# Patient Record
Sex: Female | Born: 1940
Health system: Southern US, Community
[De-identification: ages and names within clinical notes are randomized; demographics above are authoritative.]

## PROBLEM LIST (undated history)

## (undated) DIAGNOSIS — M545 Low back pain, unspecified: Secondary | ICD-10-CM

## (undated) DIAGNOSIS — F430 Acute stress reaction: Secondary | ICD-10-CM

## (undated) DIAGNOSIS — I8390 Asymptomatic varicose veins of unspecified lower extremity: Secondary | ICD-10-CM

## (undated) DIAGNOSIS — E7800 Pure hypercholesterolemia, unspecified: Secondary | ICD-10-CM

## (undated) DIAGNOSIS — H269 Unspecified cataract: Secondary | ICD-10-CM

## (undated) DIAGNOSIS — K5730 Diverticulosis of large intestine without perforation or abscess without bleeding: Secondary | ICD-10-CM

## (undated) DIAGNOSIS — R51 Headache: Secondary | ICD-10-CM

## (undated) DIAGNOSIS — J449 Chronic obstructive pulmonary disease, unspecified: Secondary | ICD-10-CM

## (undated) DIAGNOSIS — F17210 Nicotine dependence, cigarettes, uncomplicated: Secondary | ICD-10-CM

## (undated) HISTORY — DX: Pure hypercholesterolemia, unspecified: E78.00

## (undated) HISTORY — PX: OTHER SURGICAL HISTORY: SHX169

## (undated) HISTORY — DX: Headache: R51

## (undated) HISTORY — DX: Acute stress reaction: F43.0

## (undated) HISTORY — DX: Unspecified cataract: H26.9

## (undated) HISTORY — DX: Nicotine dependence, cigarettes, uncomplicated: F17.210

## (undated) HISTORY — DX: Asymptomatic varicose veins of unspecified lower extremity: I83.90

## (undated) HISTORY — DX: Low back pain, unspecified: M54.50

## (undated) HISTORY — DX: Chronic obstructive pulmonary disease, unspecified: J44.9

## (undated) HISTORY — PX: LUMBAR SPINE SURGERY: SHX701

## (undated) HISTORY — DX: Low back pain: M54.5

## (undated) HISTORY — DX: Diverticulosis of large intestine without perforation or abscess without bleeding: K57.30

---

## 1999-01-01 ENCOUNTER — Emergency Department (HOSPITAL_COMMUNITY): Admission: EM | Admit: 1999-01-01 | Discharge: 1999-01-02 | Payer: Self-pay

## 1999-08-19 ENCOUNTER — Encounter: Payer: Self-pay | Admitting: Gynecology

## 1999-08-19 ENCOUNTER — Other Ambulatory Visit: Admission: RE | Admit: 1999-08-19 | Discharge: 1999-08-19 | Payer: Self-pay | Admitting: Gynecology

## 1999-08-19 ENCOUNTER — Encounter: Admission: RE | Admit: 1999-08-19 | Discharge: 1999-08-19 | Payer: Self-pay | Admitting: Gynecology

## 2000-08-23 ENCOUNTER — Other Ambulatory Visit: Admission: RE | Admit: 2000-08-23 | Discharge: 2000-08-23 | Payer: Self-pay | Admitting: Gynecology

## 2000-08-24 ENCOUNTER — Encounter: Admission: RE | Admit: 2000-08-24 | Discharge: 2000-08-24 | Payer: Self-pay | Admitting: Gynecology

## 2000-08-24 ENCOUNTER — Encounter: Payer: Self-pay | Admitting: Gynecology

## 2000-12-13 ENCOUNTER — Ambulatory Visit (HOSPITAL_COMMUNITY): Admission: RE | Admit: 2000-12-13 | Discharge: 2000-12-13 | Payer: Self-pay | Admitting: Pulmonary Disease

## 2000-12-13 ENCOUNTER — Encounter: Payer: Self-pay | Admitting: Pulmonary Disease

## 2001-01-16 ENCOUNTER — Encounter: Admission: RE | Admit: 2001-01-16 | Discharge: 2001-01-25 | Payer: Self-pay | Admitting: Neurology

## 2001-09-03 ENCOUNTER — Encounter: Admission: RE | Admit: 2001-09-03 | Discharge: 2001-09-03 | Payer: Self-pay | Admitting: Pulmonary Disease

## 2001-09-03 ENCOUNTER — Encounter: Payer: Self-pay | Admitting: Pulmonary Disease

## 2001-09-18 ENCOUNTER — Other Ambulatory Visit: Admission: RE | Admit: 2001-09-18 | Discharge: 2001-09-18 | Payer: Self-pay | Admitting: Gynecology

## 2001-09-25 ENCOUNTER — Encounter: Payer: Self-pay | Admitting: Gynecology

## 2001-09-25 ENCOUNTER — Encounter: Admission: RE | Admit: 2001-09-25 | Discharge: 2001-09-25 | Payer: Self-pay | Admitting: Gynecology

## 2002-09-05 ENCOUNTER — Encounter: Admission: RE | Admit: 2002-09-05 | Discharge: 2002-09-05 | Payer: Self-pay | Admitting: Pulmonary Disease

## 2002-09-05 ENCOUNTER — Encounter: Payer: Self-pay | Admitting: Pulmonary Disease

## 2002-09-22 ENCOUNTER — Other Ambulatory Visit: Admission: RE | Admit: 2002-09-22 | Discharge: 2002-09-22 | Payer: Self-pay | Admitting: Gynecology

## 2003-09-09 ENCOUNTER — Ambulatory Visit (HOSPITAL_COMMUNITY): Admission: RE | Admit: 2003-09-09 | Discharge: 2003-09-09 | Payer: Self-pay | Admitting: Pulmonary Disease

## 2003-09-24 ENCOUNTER — Other Ambulatory Visit: Admission: RE | Admit: 2003-09-24 | Discharge: 2003-09-24 | Payer: Self-pay | Admitting: Gynecology

## 2004-04-22 ENCOUNTER — Ambulatory Visit: Payer: Self-pay | Admitting: Internal Medicine

## 2004-04-22 HISTORY — PX: COLONOSCOPY: SHX174

## 2004-04-28 ENCOUNTER — Ambulatory Visit: Payer: Self-pay

## 2004-04-28 ENCOUNTER — Ambulatory Visit: Payer: Self-pay | Admitting: Cardiology

## 2004-06-06 ENCOUNTER — Ambulatory Visit: Payer: Self-pay | Admitting: Cardiovascular Disease

## 2004-07-04 ENCOUNTER — Ambulatory Visit: Payer: Self-pay | Admitting: *Deleted

## 2004-07-04 ENCOUNTER — Ambulatory Visit: Payer: Self-pay | Admitting: Pulmonary Disease

## 2004-08-01 ENCOUNTER — Ambulatory Visit: Payer: Self-pay | Admitting: Cardiology

## 2004-09-14 ENCOUNTER — Ambulatory Visit (HOSPITAL_COMMUNITY): Admission: RE | Admit: 2004-09-14 | Discharge: 2004-09-14 | Payer: Self-pay | Admitting: Pulmonary Disease

## 2004-09-26 ENCOUNTER — Other Ambulatory Visit: Admission: RE | Admit: 2004-09-26 | Discharge: 2004-09-26 | Payer: Self-pay | Admitting: Gynecology

## 2004-10-31 ENCOUNTER — Ambulatory Visit: Payer: Self-pay | Admitting: Cardiology

## 2004-11-07 ENCOUNTER — Ambulatory Visit: Payer: Self-pay | Admitting: Cardiology

## 2005-02-06 ENCOUNTER — Ambulatory Visit: Payer: Self-pay | Admitting: Cardiology

## 2005-04-26 ENCOUNTER — Ambulatory Visit: Payer: Self-pay | Admitting: Pulmonary Disease

## 2005-05-26 ENCOUNTER — Ambulatory Visit: Payer: Self-pay | Admitting: Cardiology

## 2005-05-30 ENCOUNTER — Ambulatory Visit: Payer: Self-pay | Admitting: Pulmonary Disease

## 2005-09-07 ENCOUNTER — Ambulatory Visit: Payer: Self-pay | Admitting: Cardiology

## 2005-09-18 ENCOUNTER — Ambulatory Visit (HOSPITAL_COMMUNITY): Admission: RE | Admit: 2005-09-18 | Discharge: 2005-09-18 | Payer: Self-pay | Admitting: Gynecology

## 2005-09-28 ENCOUNTER — Ambulatory Visit: Payer: Self-pay

## 2005-10-05 ENCOUNTER — Ambulatory Visit: Payer: Self-pay | Admitting: Internal Medicine

## 2005-12-22 ENCOUNTER — Ambulatory Visit: Payer: Self-pay | Admitting: Internal Medicine

## 2005-12-28 ENCOUNTER — Ambulatory Visit: Payer: Self-pay | Admitting: Cardiology

## 2006-01-25 ENCOUNTER — Ambulatory Visit: Payer: Self-pay | Admitting: Cardiology

## 2006-04-04 ENCOUNTER — Ambulatory Visit: Payer: Self-pay | Admitting: Pulmonary Disease

## 2006-04-13 ENCOUNTER — Ambulatory Visit: Payer: Self-pay

## 2006-04-19 ENCOUNTER — Ambulatory Visit: Payer: Self-pay | Admitting: *Deleted

## 2006-07-20 ENCOUNTER — Ambulatory Visit: Payer: Self-pay | Admitting: Pulmonary Disease

## 2006-09-25 ENCOUNTER — Ambulatory Visit: Payer: Self-pay | Admitting: Pulmonary Disease

## 2006-09-25 ENCOUNTER — Ambulatory Visit: Payer: Self-pay

## 2006-09-25 LAB — CONVERTED CEMR LAB
ALT: 28 units/L (ref 0–40)
Albumin: 3.8 g/dL (ref 3.5–5.2)
Alkaline Phosphatase: 45 units/L (ref 39–117)
LDL Cholesterol: 109 mg/dL — ABNORMAL HIGH (ref 0–99)
Total CHOL/HDL Ratio: 3.1
Total Protein: 6.7 g/dL (ref 6.0–8.3)
VLDL: 27 mg/dL (ref 0–40)

## 2006-10-03 ENCOUNTER — Ambulatory Visit (HOSPITAL_COMMUNITY): Admission: RE | Admit: 2006-10-03 | Discharge: 2006-10-03 | Payer: Self-pay | Admitting: Family Medicine

## 2006-10-03 ENCOUNTER — Other Ambulatory Visit: Admission: RE | Admit: 2006-10-03 | Discharge: 2006-10-03 | Payer: Self-pay | Admitting: Gynecology

## 2006-10-04 ENCOUNTER — Ambulatory Visit: Payer: Self-pay | Admitting: Internal Medicine

## 2007-01-21 ENCOUNTER — Ambulatory Visit: Payer: Self-pay | Admitting: Cardiology

## 2007-01-21 LAB — CONVERTED CEMR LAB
AST: 27 units/L (ref 0–37)
Albumin: 4 g/dL (ref 3.5–5.2)
Bilirubin, Direct: 0.1 mg/dL (ref 0.0–0.3)
Cholesterol: 248 mg/dL (ref 0–200)
Direct LDL: 152.2 mg/dL
HDL: 57.8 mg/dL (ref >39.0)
Total Bilirubin: 0.7 mg/dL (ref 0.3–1.2)
Triglycerides: 163 mg/dL — ABNORMAL HIGH (ref 0–149)
VLDL: 33 mg/dL (ref 0–40)

## 2007-01-24 ENCOUNTER — Ambulatory Visit: Payer: Self-pay | Admitting: Cardiovascular Disease

## 2007-02-26 ENCOUNTER — Ambulatory Visit: Payer: Self-pay | Admitting: Cardiology

## 2007-02-26 LAB — CONVERTED CEMR LAB
Bilirubin, Direct: 0.1 mg/dL (ref 0.0–0.3)
HDL: 57.1 mg/dL (ref >39.0)
Total Protein: 6.8 g/dL (ref 6.0–8.3)
Triglycerides: 96 mg/dL (ref 0–149)
VLDL: 19 mg/dL (ref 0–40)

## 2007-03-07 ENCOUNTER — Ambulatory Visit: Payer: Self-pay | Admitting: Cardiology

## 2007-04-29 ENCOUNTER — Ambulatory Visit: Payer: Self-pay | Admitting: Internal Medicine

## 2007-04-29 LAB — CONVERTED CEMR LAB
ALT: 30 units/L (ref 0–35)
Alkaline Phosphatase: 52 units/L (ref 39–117)
Bilirubin, Direct: 0.1 mg/dL (ref 0.0–0.3)
Cholesterol: 254 mg/dL (ref 0–200)
Total CHOL/HDL Ratio: 4.5
Total Protein: 6.9 g/dL (ref 6.0–8.3)
Triglycerides: 157 mg/dL — ABNORMAL HIGH (ref 0–149)
VLDL: 31 mg/dL (ref 0–40)

## 2007-05-02 ENCOUNTER — Ambulatory Visit: Payer: Self-pay | Admitting: Cardiology

## 2007-07-09 ENCOUNTER — Ambulatory Visit: Payer: Self-pay | Admitting: Internal Medicine

## 2007-07-09 LAB — CONVERTED CEMR LAB
ALT: 27 units/L (ref 0–35)
Alkaline Phosphatase: 56 units/L (ref 39–117)
Total Bilirubin: 0.8 mg/dL (ref 0.3–1.2)
Total Protein: 6.8 g/dL (ref 6.0–8.3)
Triglycerides: 203 mg/dL (ref 0–149)

## 2007-07-11 ENCOUNTER — Ambulatory Visit: Payer: Self-pay | Admitting: Cardiology

## 2007-09-02 ENCOUNTER — Ambulatory Visit: Payer: Self-pay | Admitting: Internal Medicine

## 2007-09-02 LAB — CONVERTED CEMR LAB
Cholesterol: 250 mg/dL (ref 0–200)
Direct LDL: 146.3 mg/dL
HDL: 57.5 mg/dL (ref >39.0)
Total CHOL/HDL Ratio: 4.3
Total CK: 90 units/L (ref 7–177)
Triglycerides: 215 mg/dL (ref 0–149)

## 2007-09-05 ENCOUNTER — Ambulatory Visit: Payer: Self-pay | Admitting: Cardiology

## 2007-10-01 ENCOUNTER — Telehealth (INDEPENDENT_AMBULATORY_CARE_PROVIDER_SITE_OTHER): Payer: Self-pay | Admitting: *Deleted

## 2007-10-02 ENCOUNTER — Ambulatory Visit: Payer: Self-pay | Admitting: Pulmonary Disease

## 2007-10-02 LAB — CONVERTED CEMR LAB
AST: 28 units/L (ref 0–37)
Alkaline Phosphatase: 54 units/L (ref 39–117)
Basophils Absolute: 0.1 10*3/uL (ref 0.0–0.1)
Bilirubin Urine: NEGATIVE
Bilirubin, Direct: 0.1 mg/dL (ref 0.0–0.3)
Chloride: 107 meq/L (ref 96–112)
Cholesterol: 270 mg/dL (ref 0–200)
Direct LDL: 170.1 mg/dL
Eosinophils Absolute: 0.2 10*3/uL (ref 0.0–0.7)
GFR calc non Af Amer: 76 mL/min
HCT: 38.6 % (ref 36.0–46.0)
HDL: 58.7 mg/dL (ref >39.0)
Hemoglobin, Urine: NEGATIVE
Leukocytes, UA: NEGATIVE
Lymphocytes Relative: 32.6 % (ref 12.0–46.0)
Monocytes Relative: 7.8 % (ref 3.0–12.0)
Nitrite: NEGATIVE
Platelets: 258 10*3/uL (ref 150–400)
Potassium: 3.8 meq/L (ref 3.5–5.1)
RDW: 13.3 % (ref 11.5–14.6)
Sodium: 139 meq/L (ref 135–145)
Total Bilirubin: 0.8 mg/dL (ref 0.3–1.2)
Total CHOL/HDL Ratio: 4.6
Triglycerides: 166 mg/dL — ABNORMAL HIGH (ref 0–149)
Urobilinogen, UA: 0.2 (ref 0.0–1.0)
VLDL: 33 mg/dL (ref 0–40)

## 2007-10-08 DIAGNOSIS — M545 Low back pain, unspecified: Secondary | ICD-10-CM | POA: Insufficient documentation

## 2007-10-08 DIAGNOSIS — J449 Chronic obstructive pulmonary disease, unspecified: Secondary | ICD-10-CM | POA: Insufficient documentation

## 2007-10-08 DIAGNOSIS — E7800 Pure hypercholesterolemia, unspecified: Secondary | ICD-10-CM | POA: Insufficient documentation

## 2007-10-09 ENCOUNTER — Ambulatory Visit: Payer: Self-pay | Admitting: Pulmonary Disease

## 2007-10-09 DIAGNOSIS — F17200 Nicotine dependence, unspecified, uncomplicated: Secondary | ICD-10-CM | POA: Insufficient documentation

## 2007-10-12 DIAGNOSIS — I8390 Asymptomatic varicose veins of unspecified lower extremity: Secondary | ICD-10-CM | POA: Insufficient documentation

## 2007-10-12 DIAGNOSIS — E039 Hypothyroidism, unspecified: Secondary | ICD-10-CM | POA: Insufficient documentation

## 2007-10-12 DIAGNOSIS — K5730 Diverticulosis of large intestine without perforation or abscess without bleeding: Secondary | ICD-10-CM | POA: Insufficient documentation

## 2007-10-16 ENCOUNTER — Ambulatory Visit (HOSPITAL_COMMUNITY): Admission: RE | Admit: 2007-10-16 | Discharge: 2007-10-16 | Payer: Self-pay | Admitting: Pulmonary Disease

## 2007-10-21 ENCOUNTER — Telehealth (INDEPENDENT_AMBULATORY_CARE_PROVIDER_SITE_OTHER): Payer: Self-pay | Admitting: *Deleted

## 2007-11-21 ENCOUNTER — Telehealth (INDEPENDENT_AMBULATORY_CARE_PROVIDER_SITE_OTHER): Payer: Self-pay | Admitting: *Deleted

## 2007-12-23 ENCOUNTER — Ambulatory Visit: Payer: Self-pay

## 2007-12-23 LAB — CONVERTED CEMR LAB
Cholesterol: 219 mg/dL (ref 0–200)
Direct LDL: 139.4 mg/dL
Triglycerides: 154 mg/dL — ABNORMAL HIGH (ref 0–149)

## 2007-12-26 ENCOUNTER — Ambulatory Visit: Payer: Self-pay | Admitting: Cardiology

## 2008-04-27 ENCOUNTER — Ambulatory Visit: Payer: Self-pay | Admitting: Cardiovascular Disease

## 2008-04-27 LAB — CONVERTED CEMR LAB
AST: 30 units/L (ref 0–37)
Calcium: 9.5 mg/dL (ref 8.4–10.5)
Chloride: 106 meq/L (ref 96–112)
Cholesterol: 226 mg/dL (ref 0–200)
Creatinine, Ser: 1 mg/dL (ref 0.4–1.2)
Direct LDL: 137.2 mg/dL
GFR calc Af Amer: 71 mL/min
GFR calc non Af Amer: 58 mL/min
HDL: 53.7 mg/dL (ref >39.0)
Potassium: 4 meq/L (ref 3.5–5.1)
Total Bilirubin: 0.7 mg/dL (ref 0.3–1.2)
VLDL: 27 mg/dL (ref 0–40)

## 2008-05-04 ENCOUNTER — Ambulatory Visit: Payer: Self-pay | Admitting: Cardiology

## 2008-07-27 ENCOUNTER — Ambulatory Visit: Payer: Self-pay | Admitting: Cardiology

## 2008-07-27 LAB — CONVERTED CEMR LAB
Albumin: 4 g/dL (ref 3.5–5.2)
Alkaline Phosphatase: 46 units/L (ref 39–117)
Bilirubin, Direct: 0.1 mg/dL (ref 0.0–0.3)
Cholesterol: 220 mg/dL (ref 0–200)
Total CHOL/HDL Ratio: 3.9
VLDL: 22 mg/dL (ref 0–40)

## 2008-08-03 ENCOUNTER — Ambulatory Visit: Payer: Self-pay | Admitting: Internal Medicine

## 2008-09-28 ENCOUNTER — Ambulatory Visit: Payer: Self-pay

## 2008-09-28 LAB — CONVERTED CEMR LAB
ALT: 22 units/L (ref 0–35)
Albumin: 3.7 g/dL (ref 3.5–5.2)
Cholesterol: 222 mg/dL — ABNORMAL HIGH (ref 0–200)
Direct LDL: 138.2 mg/dL
HDL: 54.1 mg/dL (ref >39.00)
Total Bilirubin: 0.6 mg/dL (ref 0.3–1.2)
Triglycerides: 94 mg/dL (ref 0.0–149.0)

## 2008-10-05 ENCOUNTER — Encounter: Payer: Self-pay | Admitting: Cardiology

## 2008-10-05 ENCOUNTER — Ambulatory Visit: Payer: Self-pay | Admitting: Cardiovascular Disease

## 2008-10-07 ENCOUNTER — Ambulatory Visit: Payer: Self-pay | Admitting: Pulmonary Disease

## 2008-10-11 LAB — CONVERTED CEMR LAB
Basophils Relative: 1 % (ref 0.0–3.0)
CO2: 30 meq/L (ref 19–32)
Chloride: 104 meq/L (ref 96–112)
Creatinine, Ser: 0.9 mg/dL (ref 0.4–1.2)
Eosinophils Relative: 1.8 % (ref 0.0–5.0)
HCT: 41.4 % (ref 36.0–46.0)
Lymphocytes Relative: 33.1 % (ref 12.0–46.0)
Monocytes Relative: 9.5 % (ref 3.0–12.0)
Neutrophils Relative %: 54.6 % (ref 43.0–77.0)
Platelets: 292 10*3/uL (ref 150.0–400.0)
Potassium: 3.4 meq/L — ABNORMAL LOW (ref 3.5–5.1)
RBC: 4.6 M/uL (ref 3.87–5.11)
TSH: 3.44 microintl units/mL (ref 0.35–5.50)
Vit D, 25-Hydroxy: 30 ng/mL (ref 30–89)
WBC: 6.3 10*3/uL (ref 4.5–10.5)

## 2008-10-19 ENCOUNTER — Ambulatory Visit (HOSPITAL_COMMUNITY): Admission: RE | Admit: 2008-10-19 | Discharge: 2008-10-19 | Payer: Self-pay | Admitting: Pulmonary Disease

## 2008-10-19 ENCOUNTER — Encounter: Payer: Self-pay | Admitting: Pulmonary Disease

## 2009-01-04 ENCOUNTER — Ambulatory Visit: Payer: Self-pay | Admitting: Cardiovascular Disease

## 2009-01-06 LAB — CONVERTED CEMR LAB
ALT: 28 units/L (ref 0–35)
AST: 25 units/L (ref 0–37)
Albumin: 3.6 g/dL (ref 3.5–5.2)
Alkaline Phosphatase: 60 units/L (ref 39–117)
Bilirubin, Direct: 0 mg/dL (ref 0.0–0.3)
Cholesterol: 219 mg/dL — ABNORMAL HIGH (ref 0–200)
Direct LDL: 147.8 mg/dL
HDL: 55.5 mg/dL (ref >39.00)
Total Bilirubin: 0.6 mg/dL (ref 0.3–1.2)
Total CHOL/HDL Ratio: 4
Total Protein: 6.7 g/dL (ref 6.0–8.3)
Triglycerides: 96 mg/dL (ref 0.0–149.0)
VLDL: 19.2 mg/dL (ref 0.0–40.0)

## 2009-01-11 ENCOUNTER — Ambulatory Visit: Payer: Self-pay | Admitting: Cardiology

## 2009-04-19 ENCOUNTER — Ambulatory Visit: Payer: Self-pay | Admitting: Internal Medicine

## 2009-04-19 DIAGNOSIS — E782 Mixed hyperlipidemia: Secondary | ICD-10-CM | POA: Insufficient documentation

## 2009-04-19 LAB — CONVERTED CEMR LAB
ALT: 30 units/L
LDL Cholesterol: 105 mg/dL

## 2009-04-22 ENCOUNTER — Ambulatory Visit: Payer: Self-pay | Admitting: Internal Medicine

## 2009-04-22 LAB — CONVERTED CEMR LAB
Cholesterol, target level: 200 mg/dL
LDL Goal: 130 mg/dL

## 2009-04-29 LAB — CONVERTED CEMR LAB
ALT: 30 units/L (ref 0–35)
AST: 25 units/L (ref 0–37)
Cholesterol: 228 mg/dL — ABNORMAL HIGH (ref 0–200)
Direct LDL: 151.9 mg/dL
HDL: 54.4 mg/dL (ref >39.00)
Total CHOL/HDL Ratio: 4
Triglycerides: 105 mg/dL (ref 0.0–149.0)

## 2009-05-21 ENCOUNTER — Ambulatory Visit: Payer: Self-pay | Admitting: Pulmonary Disease

## 2009-08-16 ENCOUNTER — Ambulatory Visit: Payer: Self-pay | Admitting: Pulmonary Disease

## 2009-08-16 DIAGNOSIS — R519 Headache, unspecified: Secondary | ICD-10-CM | POA: Insufficient documentation

## 2009-08-16 DIAGNOSIS — H9190 Unspecified hearing loss, unspecified ear: Secondary | ICD-10-CM | POA: Insufficient documentation

## 2009-08-16 DIAGNOSIS — R51 Headache: Secondary | ICD-10-CM | POA: Insufficient documentation

## 2009-08-19 LAB — CONVERTED CEMR LAB
ALT: 23 units/L (ref 0–35)
AST: 21 units/L (ref 0–37)
Alkaline Phosphatase: 53 units/L (ref 39–117)
Basophils Relative: 1.3 % (ref 0.0–3.0)
Bilirubin, Direct: 0.1 mg/dL (ref 0.0–0.3)
CO2: 28 meq/L (ref 19–32)
Calcium: 9.6 mg/dL (ref 8.4–10.5)
Chloride: 106 meq/L (ref 96–112)
Eosinophils Absolute: 0.4 10*3/uL (ref 0.0–0.7)
Eosinophils Relative: 6.7 % — ABNORMAL HIGH (ref 0.0–5.0)
Hemoglobin: 13.5 g/dL (ref 12.0–15.0)
MCHC: 32.8 g/dL (ref 30.0–36.0)
MCV: 91.2 fL (ref 78.0–100.0)
Monocytes Absolute: 0.6 10*3/uL (ref 0.1–1.0)
Neutro Abs: 3.6 10*3/uL (ref 1.4–7.7)
Potassium: 3.8 meq/L (ref 3.5–5.1)
Sodium: 139 meq/L (ref 135–145)
Total Protein: 7 g/dL (ref 6.0–8.3)

## 2009-09-29 ENCOUNTER — Encounter: Payer: Self-pay | Admitting: Pulmonary Disease

## 2009-10-14 ENCOUNTER — Encounter: Admission: RE | Admit: 2009-10-14 | Discharge: 2009-10-14 | Payer: Self-pay | Admitting: Diagnostic Neuroimaging

## 2009-10-14 ENCOUNTER — Encounter: Payer: Self-pay | Admitting: Pulmonary Disease

## 2009-10-20 ENCOUNTER — Ambulatory Visit (HOSPITAL_COMMUNITY): Admission: RE | Admit: 2009-10-20 | Discharge: 2009-10-20 | Payer: Self-pay | Admitting: Pulmonary Disease

## 2009-10-20 ENCOUNTER — Ambulatory Visit: Payer: Self-pay | Admitting: Internal Medicine

## 2009-10-21 ENCOUNTER — Ambulatory Visit: Payer: Self-pay | Admitting: Internal Medicine

## 2009-10-22 LAB — CONVERTED CEMR LAB
AST: 24 units/L (ref 0–37)
Albumin: 3.8 g/dL (ref 3.5–5.2)
Bilirubin, Direct: 0.1 mg/dL (ref 0.0–0.3)
Direct LDL: 166.2 mg/dL
Total Bilirubin: 0.4 mg/dL (ref 0.3–1.2)
Total CHOL/HDL Ratio: 4
Triglycerides: 92 mg/dL (ref 0.0–149.0)

## 2009-10-26 ENCOUNTER — Encounter: Payer: Self-pay | Admitting: Pulmonary Disease

## 2010-01-11 ENCOUNTER — Telehealth (INDEPENDENT_AMBULATORY_CARE_PROVIDER_SITE_OTHER): Payer: Self-pay | Admitting: *Deleted

## 2010-02-22 ENCOUNTER — Ambulatory Visit: Payer: Self-pay | Admitting: Internal Medicine

## 2010-02-22 LAB — CONVERTED CEMR LAB
AST: 21 units/L (ref 0–37)
Albumin: 3.8 g/dL (ref 3.5–5.2)
Alkaline Phosphatase: 47 units/L (ref 39–117)
Cholesterol: 240 mg/dL — ABNORMAL HIGH (ref 0–200)
Direct LDL: 159.7 mg/dL
Total Protein: 6.3 g/dL (ref 6.0–8.3)
Triglycerides: 205 mg/dL — ABNORMAL HIGH (ref 0.0–149.0)

## 2010-03-03 ENCOUNTER — Ambulatory Visit: Payer: Self-pay | Admitting: Cardiology

## 2010-04-06 ENCOUNTER — Encounter: Payer: Self-pay | Admitting: Cardiology

## 2010-04-22 ENCOUNTER — Telehealth: Payer: Self-pay | Admitting: Pulmonary Disease

## 2010-05-23 ENCOUNTER — Ambulatory Visit: Payer: Self-pay

## 2010-05-30 ENCOUNTER — Ambulatory Visit: Payer: Self-pay | Admitting: Internal Medicine

## 2010-07-05 ENCOUNTER — Ambulatory Visit: Admission: RE | Admit: 2010-07-05 | Discharge: 2010-07-05 | Payer: Self-pay | Source: Home / Self Care

## 2010-07-05 ENCOUNTER — Other Ambulatory Visit: Payer: Self-pay

## 2010-07-05 LAB — HEPATIC FUNCTION PANEL
ALT: 26 U/L (ref 0–35)
AST: 22 U/L (ref 0–37)
Albumin: 4 g/dL (ref 3.5–5.2)
Alkaline Phosphatase: 61 U/L (ref 39–117)
Bilirubin, Direct: 0.1 mg/dL (ref 0.0–0.3)
Total Bilirubin: 0.6 mg/dL (ref 0.3–1.2)
Total Protein: 6.8 g/dL (ref 6.0–8.3)

## 2010-07-05 LAB — LDL CHOLESTEROL, DIRECT: Direct LDL: 135.1 mg/dL

## 2010-07-05 LAB — LIPID PANEL
Cholesterol: 209 mg/dL — ABNORMAL HIGH (ref 0–200)
HDL: 63.7 mg/dL (ref >39.00)
Total CHOL/HDL Ratio: 3
Triglycerides: 126 mg/dL (ref 0.0–149.0)
VLDL: 25.2 mg/dL (ref 0.0–40.0)

## 2010-07-07 ENCOUNTER — Ambulatory Visit: Admission: RE | Admit: 2010-07-07 | Discharge: 2010-07-07 | Payer: Self-pay | Source: Home / Self Care

## 2010-07-12 ENCOUNTER — Telehealth: Payer: Self-pay | Admitting: Internal Medicine

## 2010-07-19 ENCOUNTER — Telehealth: Payer: Self-pay | Admitting: Internal Medicine
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
## 2010-07-19 NOTE — Progress Notes (Signed)
Summary: new pt  Phone Note Call from Patient Call back at Home Phone [phone]   Caller: Patient Call For: nadel Summary of Call: Pt wants SN to see her son as a new pt, re: breathing problems, she's aware that he's not taking new patients, but wants him to see her son. His name is Sharmon Leyden, DOB [phone] pls advise. Initial call taken by: Darletta Moll,  April 22, 2010 11:13 AM  Follow-up for Phone Call        this pt does not have a chart.  please advise if SN would like to take this pt on, thanks! Boone Master CNA/MA  April 22, 2010 11:40 AM   Additional Follow-up for Phone Call Additional follow up Details #1::        per SN---ok to schedule appt for her son. 11-23 at 2:30   thanks Randell Loop CMA  April 22, 2010 4:39 PM   pt scheduled to see SN on 05-11-10.Carron Curie CMA  April 22, 2010 5:03 PM

## 2010-07-19 NOTE — Assessment & Plan Note (Signed)
Summary: rov/ewj   [name] [name] is seen back in the lipid clinic for continued evaluation and medication consideration associated with her hyperlipidemia.  She is currently on NO lipid lowering therapy.  Diet is generally healthy.  Patient is not currently exercising.  She continues to smoke and has been under elevated stress levels.  Patient does not currently exercise, but has a full set of gym equipment in her bedroom at home.  She is willing to begin incorporating exercise into her week.  I have encouraged exercising at least twice per week to start and more as tolerated and as able.    Patient smokes 3/4 ppd currently, this is an increase vs. previous visit.  She is under increased stress, as she has had a family member move in with her and her husband while the family member finds a new job.  This has upset the patient's daily routine and has caused a significant amount of stress in her life.  She expresses that she enjoys having the family member around, but that it does add stress.        Lipid Management Provider  Eda Keys, PharmD  Preventive Screening-Counseling & Management  Alcohol-Tobacco     Smoking Status: current     Smoking Cessation Counseling: yes     Smoke Cessation Stage: precontemplative     Packs/Day: 0.75  Comments: Pt quit in the past for  ~2 years and knows she can do it again when ready.  Allergies: 1)  ! Lipitor (Atorvastatin Calcium) 2)  ! Zetia (Ezetimibe) 3)  ! Crestor (Rosuvastatin Calcium)  Social History: Packs/Day:  0.75   Impression & Recommendations:  Problem # 1:  HYPERCHOLESTEROLEMIA (ICD-272.0) Assessment Deteriorated Lipids Values:  TC 253 (goal <200), TG 92 (goal <150), HDL 57.7 (goal >50), LDL 166.2 (goal <100).  TC is above goal.  TG are at goal and have been for quite some time.  HDL is at goal.  LDL is above goal and has not been at goal for many previous visits.  Diet is generally well controlled, although patient only gets  ~2  servings of veg per day.  Patient is not exercising and has been instructed to begin incorporating this.  Unfortunately, LDL is not at goal likely due to pts inability to tolerate statins.  Patient would like to attempt to lower LDL with increased exercise and improvement in diet.  We will see patient in 3 months.  If LDL is still elevated, she is willing to try a low dose statin on a 3-5x/week schedule.  Patient also will begin considering smoking cessation, although she is not ready at this time.  Plan: Continue healthy dietary choices. Begin exercising, with an initial goal of twice weekly, increasing as tolerated and able. Attempt to wean down number of cigarettes smoked per day. Return to clinic in 3 months  Patient Instructions: 1)  It was a pleasure meeting with you today. 2)  Please contintue making healthy dietary choices.  Low-fat, high fiber choices, try to get at least 3-5 servings of vegetables per day.  3)  Start exercising at least 2 times per week.  4)  Consider tapering down number of cigarettes per day.  5)  Return to clinic in 3 months. 6)  Labwork:  Sept 6th @ 8:45 am 7)  Lipid Clinic:  Sept 8th @ 9:30 am

## 2010-07-19 NOTE — Consult Note (Signed)
Summary: [facility]  [facility]   Imported By: Sherian Rein 10/05/2009 12:08:31  _____________________________________________________________________  External Attachment:    Type:   Image     Comment:   External Document

## 2010-07-19 NOTE — Miscellaneous (Signed)
Summary: Smoking Cessation   Clinical Lists Changes Pt called to report she is doing well on nicotine patches.  Is not smoking.  She has finished 1 month of 21mg  patches and is ready to decrease to 14mg  patches.   Weston Brass PharmD  April 06, 2010 9:55 AM  Medications: Removed medication of NICOTINE 21 MG/24HR PT24 (NICOTINE) Apply one patch daily. Added new medication of NICOTINE 14 MG/24HR PT24 (NICOTINE) Apply 1 patch every day. - Signed Rx of NICOTINE 14 MG/24HR PT24 (NICOTINE) Apply 1 patch every day.;  #14 patches x 0;  Signed;  Entered by: Weston Brass PharmD;  Authorized by: Marca Ancona, MD;  Method used: Electronically to [facility]  [phone]*, [address] [place], [place], [place]  [address], Ph: [phone] or [phone], Fax: [phone]    Prescriptions: NICOTINE 14 MG/24HR PT24 (NICOTINE) Apply 1 patch every day.  #14 patches x 0   Entered by:   Weston Brass PharmD   Authorized by:   Marca Ancona, MD   Signed by:   Weston Brass PharmD on 04/06/2010   Method used:   Electronically to        [facility]  [phone]* (retail)       [address]       [place], [place]  [address]       Ph: [phone] or [phone]       Fax: [phone]   RxID:   [id number]
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
## 2010-07-19 NOTE — Letter (Signed)
Summary: Beather Arbour MD  Beather Arbour MD   Imported By: Sherian Rein 11/08/2009 10:14:11  _____________________________________________________________________  External Attachment:    Type:   Image     Comment:   External Document

## 2010-07-21 NOTE — Miscellaneous (Signed)
Summary: Orders Update  Clinical Lists Changes  Orders: Added new Test order of TLB-Lipid Panel (80061-LIPID) - Signed Added new Test order of TLB-Hepatic/Liver Function Pnl (80076-HEPATIC) - Signed 
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
## 2010-07-21 NOTE — Progress Notes (Signed)
Summary: rx refill  Phone Note Refill Request Message from:  Patient on July 12, 2010 4:52 PM  Refills Requested: Medication #1:  ZETIA 10 MG TABS 1 tablet daily.  Method Requested: Telephone to Pharmacy Initial call taken by: Roe Coombs,  July 12, 2010 4:52 PM    Prescriptions: ZETIA 10 MG TABS (EZETIMIBE) 1 tablet daily  #90 x 3   Entered by:   Micki Riley CNA   Authorized by:   Laren Boom, MD, [facility]   Signed by:   Micki Riley CNA on 07/13/2010   Method used:   Electronically to        [facility]  [phone]* (retail)       [address]       [place], [place]  [address]       Ph: [phone] or [phone]       Fax: [phone]   RxID:   [id number]

## 2010-07-27 NOTE — Progress Notes (Signed)
Summary: calling regarding medication  Phone Note Call from Patient Call back at Home Phone [phone]   Caller: Patient Summary of Call: Pt's insurance need Authorization for medication Zetia before they will pay for medication Initial call taken by: Judie Grieve,  July 19, 2010 2:30 PM    Prescriptions: ZETIA 10 MG TABS (EZETIMIBE) 1 tablet daily  #90 x 3   Entered by:   Laurance Flatten CMA   Authorized by:   Laren Boom, MD, [facility]   Signed by:   Laurance Flatten CMA on 07/20/2010   Method used:   Electronically to        [facility]  [phone]* (retail)       [address]       [place], [place]  [address]       Ph: [phone] or [phone]       Fax: [phone]   RxID:   [id number]

## 2010-08-15 ENCOUNTER — Telehealth (INDEPENDENT_AMBULATORY_CARE_PROVIDER_SITE_OTHER): Payer: Self-pay | Admitting: *Deleted

## 2010-08-16 ENCOUNTER — Ambulatory Visit (INDEPENDENT_AMBULATORY_CARE_PROVIDER_SITE_OTHER): Payer: Medicare HMO | Admitting: Pulmonary Disease

## 2010-08-16 ENCOUNTER — Ambulatory Visit (INDEPENDENT_AMBULATORY_CARE_PROVIDER_SITE_OTHER)
Admission: RE | Admit: 2010-08-16 | Discharge: 2010-08-16 | Disposition: A | Discharge status: Home / Self Care | Payer: Medicare HMO | Source: Ambulatory Visit | Attending: Pulmonary Disease | Admitting: Pulmonary Disease

## 2010-08-16 ENCOUNTER — Encounter: Payer: Self-pay | Admitting: Pulmonary Disease

## 2010-08-16 ENCOUNTER — Other Ambulatory Visit: Payer: Self-pay | Admitting: Pulmonary Disease

## 2010-08-16 ENCOUNTER — Other Ambulatory Visit: Payer: Medicare HMO

## 2010-08-16 DIAGNOSIS — J449 Chronic obstructive pulmonary disease, unspecified: Secondary | ICD-10-CM

## 2010-08-16 DIAGNOSIS — E782 Mixed hyperlipidemia: Secondary | ICD-10-CM

## 2010-08-16 DIAGNOSIS — M545 Low back pain, unspecified: Secondary | ICD-10-CM

## 2010-08-16 DIAGNOSIS — R51 Headache: Secondary | ICD-10-CM

## 2010-08-16 DIAGNOSIS — I8390 Asymptomatic varicose veins of unspecified lower extremity: Secondary | ICD-10-CM

## 2010-08-16 DIAGNOSIS — F17200 Nicotine dependence, unspecified, uncomplicated: Secondary | ICD-10-CM

## 2010-08-16 DIAGNOSIS — K5730 Diverticulosis of large intestine without perforation or abscess without bleeding: Secondary | ICD-10-CM

## 2010-08-16 DIAGNOSIS — E039 Hypothyroidism, unspecified: Secondary | ICD-10-CM

## 2010-08-16 DIAGNOSIS — Z733 Stress, not elsewhere classified: Secondary | ICD-10-CM

## 2010-08-16 DIAGNOSIS — J4489 Other specified chronic obstructive pulmonary disease: Secondary | ICD-10-CM

## 2010-08-16 DIAGNOSIS — Z23 Encounter for immunization: Secondary | ICD-10-CM

## 2010-08-16 LAB — CBC WITH DIFFERENTIAL/PLATELET
Basophils Relative: 0.2 % (ref 0.0–3.0)
Eosinophils Relative: 1.9 % (ref 0.0–5.0)
HCT: 43.5 % (ref 36.0–46.0)
Hemoglobin: 14.8 g/dL (ref 12.0–15.0)
Lymphs Abs: 2.2 10*3/uL (ref 0.7–4.0)
Monocytes Absolute: 0.5 10*3/uL (ref 0.1–1.0)
Monocytes Relative: 6.7 % (ref 3.0–12.0)
Neutro Abs: 4.2 10*3/uL (ref 1.4–7.7)
RBC: 4.77 Mil/uL (ref 3.87–5.11)
WBC: 7.1 10*3/uL (ref 4.5–10.5)

## 2010-08-16 LAB — BASIC METABOLIC PANEL
Chloride: 101 mEq/L (ref 96–112)
GFR: 69.97 mL/min (ref >60.00)
Potassium: 4.2 mEq/L (ref 3.5–5.1)
Sodium: 137 mEq/L (ref 135–145)

## 2010-08-16 LAB — HEPATIC FUNCTION PANEL
AST: 22 U/L (ref 0–37)
Albumin: 4.2 g/dL (ref 3.5–5.2)

## 2010-08-19 ENCOUNTER — Encounter: Payer: Self-pay | Admitting: Pulmonary Disease

## 2010-08-25 NOTE — Progress Notes (Signed)
Summary: son will be a new pt for SN  Phone Note Call from Patient   Caller: Son [name] [name] Call For: nadel Summary of Call: leigh, per phone msg. 04/22/10- dr Kriste Basque had agreed to see pt's son- [name] [name] for SOB (new consult). he called today to schedule this appt. will dr Kriste Basque be pt's pcp or pulm? just wanted to make sure before i call him with an appt date/ time. his # is [phone] (his DOB: 12/09[birth year]) [name] [name] Initial call taken by: Tivis Ringer, CNA,  August 15, 2010 2:15 PM  Follow-up for Phone Call        primary care...thanks [name] [name] CMA  August 15, 2010 2:16 PM   [facility] for [name] re: appt date and time OF 09/21/10 AT 12PM. Tivis Ringer, CNA  August 15, 2010 4:16 PM
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
## 2010-09-06 NOTE — Letter (Signed)
Summary: [facility]  [facility]   Imported By: Sherian Rein 09/01/2010 08:56:19  _____________________________________________________________________  External Attachment:    Type:   Image     Comment:   External Document

## 2010-09-22 ENCOUNTER — Telehealth: Payer: Self-pay | Admitting: Pulmonary Disease

## 2010-09-22 ENCOUNTER — Other Ambulatory Visit: Payer: Self-pay | Admitting: Pulmonary Disease

## 2010-09-22 NOTE — Telephone Encounter (Signed)
Per SN---needs to be checked by her GYN ASAP--we can order a mammogram at the breast center for her .  thanks

## 2010-09-22 NOTE — Telephone Encounter (Signed)
Pt c/o clear discharge from her left breast since this morning. She denies any pain or tenderness in the breast and says it is not red or hot to the touch. She says she is due for her mammogram 10/21/2010. Pls advise.

## 2010-09-22 NOTE — Telephone Encounter (Signed)
Called and spoke w/ pt and advised her of SN recs. Pt states she will contact her GYN now and states she doesn't want an order for the mammogram right now. Pt stated nothing else was needed

## 2010-10-03 ENCOUNTER — Other Ambulatory Visit (INDEPENDENT_AMBULATORY_CARE_PROVIDER_SITE_OTHER): Payer: Medicare HMO | Admitting: *Deleted

## 2010-10-03 DIAGNOSIS — E7800 Pure hypercholesterolemia, unspecified: Secondary | ICD-10-CM

## 2010-10-03 DIAGNOSIS — Z79899 Other long term (current) drug therapy: Secondary | ICD-10-CM

## 2010-10-03 LAB — LIPID PANEL
Cholesterol: 200 mg/dL (ref 0–200)
HDL: 56.4 mg/dL (ref >39.00)
LDL Cholesterol: 121 mg/dL — ABNORMAL HIGH (ref 0–99)
VLDL: 22.2 mg/dL (ref 0.0–40.0)

## 2010-10-03 LAB — HEPATIC FUNCTION PANEL
ALT: 28 U/L (ref 0–35)
Total Bilirubin: 0.5 mg/dL (ref 0.3–1.2)
Total Protein: 6.2 g/dL (ref 6.0–8.3)

## 2010-10-06 ENCOUNTER — Ambulatory Visit (INDEPENDENT_AMBULATORY_CARE_PROVIDER_SITE_OTHER): Payer: Medicare HMO

## 2010-10-06 VITALS — BP 132/84 | HR 72 | Wt 164.8 lb

## 2010-10-06 DIAGNOSIS — E7800 Pure hypercholesterolemia, unspecified: Secondary | ICD-10-CM

## 2010-10-06 NOTE — Assessment & Plan Note (Signed)
TC 200, TG 111 (last visit 126 with goal <150), HDL 56, LDL 121 (last visit 135 with goal <100). Current medication regimen includes zetia 10 mg daily and fish oil 1000 mg 1-2 daily (depending on how many meals she eats).  She has had multiple statin intolerances and refuses to try them again. She is content with her current regimen. I recommended possibly doing Niacin. She has tried this in the past; however, had extreme flushing. She said she took 2 g at one time without titrating up first or pre medicating with ASA. She stated she may try just taking 500 mg and taking ASA 30 minutes before, but she was not 100% sure she wanted to try this. For now will continue with current regimen and f/u in 3 months. Reinforced eating more regular meals and to exercise after she heals from her fall.

## 2010-10-06 NOTE — Progress Notes (Signed)
Patient returns to clinic for dyslipidemia follow up. Patient seems very disinterested in the visit and was in a rush to leave. In regards to diet, she is attempting to watch what she eats, stating that she doesn't eat potatoe chips while she is driving anymore and is trying to cut down on junk food in general. She frequently skips meals and typically only eats dinner. She did not offer much of what she is eating stating simply that her diet has not changed. She did mention she likes spinach salad, strawberries, lemon pie, as well as fried apples. Review of exercise habits revealed that she is not exercising. She fell outside bending over the curb to look at something and is wearing braces on her leg due to pulled ligaments .Because of this, she has been unable to do much of any activity. She is still smoking and not interested at this time to quit. She denies any problems from medications and is compliant with her current regimen.

## 2010-10-06 NOTE — Patient Instructions (Signed)
Continue on the zetia 10 mg daily and the fish oil 1000 mg 2-3 daily  Start exercising when you heal from your fall Continue avoiding fatty foods and portion controlling

## 2010-11-01 NOTE — Assessment & Plan Note (Signed)
[facility]                               LIPID CLINIC NOTE   NAME:[name], [name] MARIN                     MRN:          [id number]  DATE:12/26/2007                            DOB:          April 25, 1941    Return office visit for Lipid Clinic.   PAST MEDICAL HISTORY:  1. Hyperlipidemia with high-risk factors.  2. Hypothyroidism.  3. History of tobacco use, quit in the last year.   MEDICATIONS:  1. Advil at bedtime.  2. Vitamin D.  3. Coenzyme Q10.  4. Multivitamin.   PHYSICAL EXAMINATION:  VITAL SIGNS:  Weight 160 pounds, blood pressure  132/78, and heart rate is 80.   LABORATORY DATA:  Total cholesterol 219, triglycerides 154, LDL 140, and  HDL 49.   ASSESSMENT:  [name] [name] is a pleasant 70 year old woman who returns to  Lipid Clinic today with no chest pain, no shortness of breath, no muscle  aches or pains.  She has recently self-discontinued her WelChol due to  GI upset and constipation.  She is intolerant to all statin medications  including Zetia and Niaspan, which have all given her myalgias, plus or  minus and elevation in her CKs.  She has most recently started the mail-  away orlistat called Alli.  She also has begun some type of improve your  heart function, lower your cholesterol Internet pill.  She takes 6 of  these a day, 3 in the morning and 3 in the evening.  She is unsure what  is in the pill.  She forgot to bring her bottles with her to this  appointment today.  I have cautioned her with the use of any type of  herbal product or over-the-counter products that is not an FDA approved  and research studied product.  I have also encouraged her to stay away  from red yeast rice, which is the same molecule of a statin product,  which she has had problems with in the past.  She does seem quite  content and happy with the tablets or pills that she is taking that she  received from the Internet.  She also is pleased with her 6-pound  weight  loss from taking the orlistat.  She does plan to continue these Internet  bought pills and does not feel that she wants to attempt to try any  other medication, FDA approved medication.  She continues to walk during  her daily activities, but she does not do any other type of exercise.  She spends most of her time running and taking care of others.  She does  understand that if she does not care for herself, she will not be able  to continue to care for others and she does state awareness of this  issue.  She has recently stopped smoking, which I have continued to  commend her on.  She also has tried to decrease the fats in her diet,  which has been easier now with the orlistat that she is taking.   Our plan is:  1. To continue no medical therapy due to  intolerance.  2. Continue to encourage exercise and low-fat, low-carbohydrate diet.  3. Followup visit in 6 months for lipid panel and LFTs to ensure that      she is not having any adverse events from the pills that she is      ordering through the Internet.  4. We will readdress all of these issues at followup visit.      Leota Sauers, PharmD  Electronically Signed      Jesse Sans. Daleen Squibb, MD, [facility]  Electronically Signed   LC/MedQ  DD: 12/26/2007  DT: 12/27/2007  Job #: [id number]

## 2010-11-01 NOTE — Assessment & Plan Note (Signed)
[facility]                               LIPID CLINIC NOTE   NAME:[name] [name], [name] [name]                      MRN:          [id number]  DATE:01/24/2007                            DOB:          [birth year]    The patient is seen in the lipid clinic for further evaluation and  medication titration associated with her hyperlipidemia in the setting  of documented high risk factors.  [name] [name] relates that she will be  married this weekend.  She is feeling and doing well overall.  She  discontinued her Zetia 90 days ago due to residual myalgias that may or  may not have resolved since that time.  The patient has been exercising  through working to remodel the house of her fiance.  She has been  following a low fat, low cholesterol diet.   PAST MEDICAL HISTORY:  High risk features and hyperlipidemia.   CURRENT MEDICATIONS:  1. Levothyroxine 0.05 mg daily.  2. Aspirin 81 mg daily.  3. Fish oil 1 g daily.  4. Vitamin D 1200 International Units daily.  5. __________ multivitamin daily.   REVIEW OF SYSTEMS:  As stated.   PHYSICAL EXAM:  Weight is 161-2/4 pounds.  Blood pressure is 110/70.  Heart rate is 68.   Labs reveal total cholesterol 247, triglycerides 163, HDL 58, and LDL  157 with LFTs within normal limits.   ASSESSMENT:  We have reviewed all the drugs that the patient has taken.  She needs a statin clearly in order to reduce her overall cardiovascular  risk.  It appears that she has not taken Lipitor in some time.  Therefore, we will try 20 mg of Lipitor 1 tab every other day.  We will  see  how she does and provide followup in 8 weeks.  Samples have been given  to the patient to facilitate compliance.     Shelby Dubin, PharmD, BCPS, CPP  Electronically Signed      Veverly Fells. Excell Seltzer, MD  Electronically Signed   MP/MedQ  DD: 01/24/2007  DT: 01/25/2007  Job #: [id number]   cc:   Lonzo Cloud. Kriste Basque, MD

## 2010-11-01 NOTE — Assessment & Plan Note (Signed)
[facility]                               LIPID CLINIC NOTE   NAME:Harron, JOLIANA CLAFLIN                     MRN:          [id number]  DATE:05/02/2007                            DOB:          May 17, 1941    PAST MEDICAL HISTORY:  1. Hypothyroidism.  2. Hyperlipidemia.  3. Status post tobacco abuse.   MEDICATIONS:  1. Synthroid 50 mcg daily.  2. Aspirin 81 mg daily.  3. Fish oil 1 gram daily.  4. Multivitamin daily.  5. Calcium with vitamin D.   VITAL SIGNS:  Weight 154 pounds, blood pressure 140/82, heart rate 80.   LABORATORY DATA:  Total cholesterol 254, triglycerides 157, HDL 56, LDL  167. LFTs within normal limits.   ASSESSMENT:  Ms. Spirito is a very pleasant woman who returns to the  lipid clinic today with no chest pain, no shortness of breath, but  complaining of muscle aches and pains. She had previously been  intolerant to other statins. Although she had been able to tolerate  Lipitor 10 mg daily in the past, when increased to higher doses, she  again had myalgias. She had stopped her Lipitor and her muscle pains  resolved. Recently, we tried to restart her Lipitor at 20 mg daily. She  again had muscle pains. We decreased that to 10 mg every other day, but  she continues to have muscle pains. She had added Coenzyme Q10, however,  this did not help with any of her muscle pains, therefore, she self  discontinued her Lipitor two weeks ago. She is complaining of ongoing  muscle pains, but hopefully this will resolve as time goes on. I will  check a total CK at her next visit to ensure that there is not ongoing  muscle breakdown.   She does follow a fairly low-fat, low-carbohydrate diet, however, she  recently got married and is in the midst of moving into her new home and  getting her old home ready to sell and put on the market. She says that  with this scattered schedule, she has an increasing amount of snacking  and high-fat, high  carbohydrate foods that she had previously been  eating. Also, amongst the business of her changing homes, she has not  been exercising as she previously had. She does understand the  importance of an exercise regimen and is willing to restart that in the  near future. I have continued to encourage a low-fat, low carbohydrate  diet and an exercise regimen with her. I feel that this will be an  ongoing battle when things get busy, but that is the first thing that  she stops doing.   She had previously been smoking cigarettes, which she had recently  stopped. She does, however, smoke 1 to 2 cigarettes per week and we will  continue to address this issue with her in the future.   Given the elevation of her lipid panel; her total cholesterol is greater  than goal of less of 200, triglycerides are greater than goal of less of  150, HDL is greater than goal of less  than 40, and LDL is greater than  goal of less than of 130, but it is as close to 100 as possible, after a  lengthy discussion, we agreed that it would probably take more than one  medication to bring her lipid panel to goal. She is agreeable to trying  other medications. I have given her samples of Niaspan 500 mg, which she  is to take each evening with a small snack and aspirin. If she tolerates  this, she will fill a prescription for 1000 mg of niacin with goal of  2000 mg of niacin each evening. I have also given her a prescription for  Welchol 625 mg 2 tablets twice daily with meals with the plan of  increasing to 3 tablets twice daily if she tolerates this. The side  effects were explained and she understands and agrees, and she will give  a call if she has any problems.   PLAN:  1. Niaspan 1000 mg daily.  2. Welchol 625 mg 2 tablets twice daily.  3. Restart exercise regimen.  4. Decrease fats and carbohydrates in diet.  5. Follow up visit in three months for lipid panel and LFTs. I will      make adjustments that will be  needed at that time. A total CK will      also be drawn at the next lab visit.      Leota Sauers, PharmD  Electronically Signed      Jesse Sans. Daleen Squibb, MD, [facility]  Electronically Signed   LC/MedQ  DD: 05/02/2007  DT: 05/03/2007  Job #: [id number]

## 2010-11-01 NOTE — Assessment & Plan Note (Signed)
[facility]                               LIPID CLINIC NOTE   NAME:[name] [name], [name] [name]                     MRN:          [id number]  DATE:09/05/2007                            DOB:          Mar 15, 1941    RETURN OFFICE VISIT FOR LIPID CLINIC.   PAST MEDICAL HISTORY:  Hyperlipidemia, hypothyroidism, status post  tobacco abuse.   MEDICATIONS:  1. Synthroid 50 mcg daily.  2. Aspirin 81 mg daily.  3. Fish oil 1 gram daily.  4. Multivitamin daily.  5. Vitamin D daily.  6. Welchol 625 mg 3 tablets twice daily.  7. Advil 200 mg at bedtime.   MEDICATION INTOLERANCES:  ALL STATIN.   VITAL SIGNS:  Weight 166 pounds and blood pressure 130/78, heart rate  66.   LABORATORY DATA:  Total cholesterol 258, triglyceride 215, LDL 146, HDL  57, 57.5.  LFTs within normal limits.  Total CK of 90.   ASSESSMENT:  Ms. Boulay is a pleasant woman who returns Lipid Clinic  today with no chest pain or shortness of breath, no muscle aches or  pains.  She is compliant current medication regimen.  However, she has  been very noncompliant with her low-fat diet.  Ever since she has gotten  remarried, she feels like she needs feed and please her husband.  She  makes dinner for him, very easily a three course meal with bread and  butter prior to meals and a very rich dessert after, coconut pie,  chocolate cream pie, et Karie Soda.  She says that she is always cooking and  baking for him and she cooks the way that he likes, but is not always  the most healthy for her.  She has decreased the amount of fast food  that she was eating, but feels that whenever she is in a rush, or need  something quick to eat, she reaches could most quickly for a bag of  chips or some other type of high carbohydrate snacks.  We had a lengthy  discussion regarding the changes needed in her diet and her 10 pounds  weight gain over the last year.  She states her willingness to increase  the amount of  vegetables in her diet, decreasing the snacks and chips.  However, she did state this at last visit as well and made very few  changes in the last few months.  She brings in a weight loss drug,  orlistat, that she wants to begin using.  I have stated this would be an  okay thing to use given that it just binds the fats in your gut from  been absorbed.  However, she will need to make marked changes to her  current diet or else she will have increased side effects from this drug  such as the increased diarrhea.  She does understand the risks of this  and is weighing the options of starting this medication.  Her exercise  is somewhat limited.  She walks approximately one mile every day, but  this is at a fairly fast pace rate.  I have encouraged  her to, three  times a week, increase that to 2 miles.  She states that she will and is  a she is not too busy.  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  She  also goes to [facility] once a week to care for an ailing brother.  I have  encouraged Ms. Dutt that do this joule that she needs to take care of  herself, or else she will not   be able to take care of those who are dependent on her.  She does  understand the risks she is putting herself in and states the  willingness to make needed dietary and exercise changes to aid in  decreasing her lipid panel given that she is unable to tolerate statin  medications.   PLAN:  1. To decrease carbohydrates and fats in diet.  2. Increase fruits and vegetables.  3. Followup visit in 4 months for medication titration as needed.  She      does understand the possibility of increasing her fish oil capsules      if her triglycerides still remain elevated.      Leota Sauers, PharmD  Electronically Signed      Jesse Sans. Daleen Squibb, MD, [facility]  Electronically Signed   LC/MedQ  DD: 09/05/2007  DT: 09/05/2007  Job #: [id number]

## 2010-11-01 NOTE — Assessment & Plan Note (Signed)
[facility]                               LIPID CLINIC NOTE   NAME:[name], [name] WESSNER                      MRN:          [id number]  DATE:03/07/2007                            DOB:          11-29-1940    PAST MEDICAL HISTORY:  Hyperlipidemia.  Status post tobacco abuse and  hypothyroidism.   MEDICATIONS:  1. Synthroid 50 mcg daily.  2. Aspirin 81 mg daily.  3. Fish oil 1 gm daily.  4. Multivitamin daily.  5. Lipitor 20 mg every other day.   VITAL SIGNS:  Weight 154 pounds.  Blood pressure 100/70.  Heart rate 56.   LABORATORY DATA:  Total cholesterol 188, triglycerides 96, HDL 57, LDL  112.  LFTs within normal limits.   ASSESSMENT:  [name] [name] is a very pleasant woman who returns to the lipid  clinic today with no chest pain, no shortness of breath, but she is  complaining of myalgias.  She says her whole body aches on the day after  she takes her Lipitor.  She had previously been taking Lipitor 20 every  other day.  She has decreased this to every 3rd to 4th day, given  intense pain that she feels the day after taking her Lipitor.  I told  her this is not a way to live her life, nor is it not okay continuing  this dose of therapy with the excessive pain that she is in.  She has  had myalgias with all statins, and also Zetia.  However, she has  tolerated low-dose statin for a lengthy period of time before she  started having some myalgias previously a year to 2 years ago.  She is  willing to drop the dose of Lipitor to 10 mg every other day.  She is  hesitant to stop taking statin therapy because she is so happy with the  LDL result that she is having.  We will try coenzyme Q10 to her daily  medication regimen to see if this helps alleviate some of the myalgias  she is feeling.  Her LFTs are within normal limits, however, I do not  have a current total CK.  If she continues to have myalgias at next  visit, we will check a total CK at that time.   Total cholesterol is at  goal of less than 200, triglycerides at goal of less than 150, HDL is at  goal of greater than 40, non-HDL at goal of less than 130.   PLAN:  1. Decrease Lipitor to 10 mg every other day.  2. Add coenzyme Q10 50 to 100 mg daily.  3. Continue low-fat diet and exercise regimen.  4. Followup visit in 8 weeks to assess myalgias.      Leota Sauers, PharmD  Electronically Signed      Rollene Rotunda, MD, [facility]  Electronically Signed   LC/MedQ  DD: 03/07/2007  DT: 03/07/2007  Job #: [id number]

## 2010-11-01 NOTE — Assessment & Plan Note (Signed)
[facility]                               LIPID CLINIC NOTE   NAME:JEWELLHillery, Zachman                     MRN:          [id number]  DATE:05/04/2008                            DOB:          1941/05/26    The patient, [name] [name] is seen back in Lipid Clinic for further  evaluation and medication titration associated with her hyperlipidemia.  She states that she has been off of her diet due to snacking reductions  and has really been out of sorts since her sister fell ill, was  hospitalized who passed away of pancreatic cancer.  She has been eating  baked or boiled meat.  She does state that she has returned to smoking  after her sister got sick.  She does not consider herself, however, to  be a smoker.  She did just start golfing with her husband and is walking  30-45 minutes three times a week  in her neighbourhood.   PAST MEDICAL HISTORY:  Pertinent for smoking history and hyperlipidemia.   CURRENT MEDICATIONS:  Have been updated to include supplements, call Dr.  Franne Forts advanced artery solutions, which include vitamin C, vitamin E,  niacin, vitamin B6, folic acid, B12, pantothenic acid, magnesium, and  selenium, n-acetylcysteine, EDTA, trimethylglycine, Bromaline,  sialic  acid, quercetin, guggul gum extract, alpha-lipoic acid, garlic  deodorized extract powder, multivitamin with minerals calcium, iodine,  magnesium, zinc, selenium, copper, manganese, chromium, molybdenum,  boron, silicon, and vanadium.   PHYSICAL EXAMINATION:  Weight today is 158 and 3/4 pounds, blood  pressure is 133/74, and heart rate is 68.   LABORATORY DATA:  On April 27, 2008, revealed total cholesterol 226,  triglycerides 137, HDL 53.7, and LDL 137.2.  LFTs are within normal  limits.   ASSESSMENT:  The patient believes that the supplements by Dr. Franne Forts  products are working for her.  Her liver panel is okay.  There have been  no major changes though in her LDL,  triglycerides, or total cholesterol.  The patient maintains that she is not tolerant to traditional statins  and Zetia.   Plan for medications include increase fish oil to 3-4 g daily or to  decrease fatty meats or cheeses, continue to work to decrease snacks and  consume increased amounts of her all  vegetables.  She has been encouraged and I again counseled her to  discontinue smoking.  She will continue golfing and walking.  We will  follow up with her in 3 months.      Shelby Dubin, PharmD, BCPS, CPP  Electronically Signed      Madolyn Frieze. Jens Som, MD, [facility]  Electronically Signed   MP/MedQ  DD: 07/13/2008  DT: 07/14/2008  Job #: [id number]

## 2010-11-01 NOTE — Assessment & Plan Note (Signed)
[facility]                               LIPID CLINIC NOTE   NAME:[name] [name], [name] [name]                     MRN:          [id number]  DATE:07/11/2007                            DOB:          March 23, 1941    PAST MEDICAL HISTORY:  1. Hyperlipidemia.  2. Hypothyroidism.  3. Status post tobacco abuse.   MEDICATIONS:  1. Synthroid 50 mcg daily.  2. Aspirin 81 mg daily.  3. Fish oil 1 gram daily.  4. Multivitamin daily.  5. Vitamin D daily.  6. Welchol 625 mg 2 tablets twice daily.  7. Advil 200 mg at bedtime.   VITAL SIGNS:  Weight is 165 pounds, blood pressure 120/60, heart rate  60.   LABORATORY DATA:  Total cholesterol 250, triglycerides 200, HDL 56, LDL  156.  LFTs within normal limits.   ASSESSMENT:  [name] [name] is a very pleasant woman who returns to the  Lipid Clinic today with no chest pain, no shortness of breath.  No  muscle aches or pains.  She is compliant current medication regimen.  However, she self discontinued her Niaspan about 3 months ago when she  had severe myalgias while taking it.  She stopped and the myalgias  resolved.  She is unwilling to retry the drug again. SHE ALSO IS  INTOLERANT TO ALL STATIN MEDICATIONS. Upon discussion today, it is  apparent that since getting married recently, [name] [name] has increased  the amount of fat in her diet.  Also increased the amount of  carbohydrates in her diet.  Her husband is does not like grilled, baked  or broiled food. He likes most of his foods pan fried.  He also does not  eat many vegetables and therefore she has increased the amount of carbs  that she eats with her meals that she fixes. They also seem to be going  for fast food much more often and is eating junior Whoppers at least  once if not twice a week and this would attribute to her 10 pounds  weight gain in the last 3 months and her increased LDL and  triglycerides.  She is tolerating Welchol and is willing to increase  the  dose.  Our plan is to increase to 625 three tablets twice a day and  increase her fish oil to 3 grams daily since she is unable to take  Niaspan or statin drugs.  Her total cholesterol is greater than goal of  less than 200.  Her triglyceride are greater than goal less than 150.  Her HDL goal is greater than 40.  Her LDL is greater than goal of less  than 100 and non HDL is greater than goal less than 130.   PLAN:  1. Restart exercise regimen.  2. Decrease fat and carbohydrates and diet, also decrease snacking.  3. Increase Welchol 625 to 3 tablets twice daily.  4. Increase fish oils at 3 grams daily.  5. Follow-up visit in 2 months for lipid panel and LFTs. Will make      adjustments as needed at that time.  [name] [name], PharmD  Electronically Signed      [name] Sans. Daleen Squibb, MD, [facility]  Electronically Signed   LC/MedQ  DD: 07/11/2007  DT: 07/11/2007  Job #: [id number]

## 2010-11-04 NOTE — Assessment & Plan Note (Signed)
[name]                              CARDIOLOGY OFFICE NOTE   NAME:[name] [name] MOHAMUD                      MRN:          [id number]  DATE:01/25/2006                            DOB:          01-04-1941    REASON FOR VISIT:  Return office visit for lipid clinic.   PAST MEDICAL HISTORY:  1. Hyperlipidemia.  2. Hypothyroidism.   MEDICATIONS:  1. Synthroid 50 mcg daily.  2. Aspirin 81 mg daily.  3. Fish oil one tablet daily.  4. Zetia 10 mg daily.   PHYSICAL EXAMINATION:  VITAL SIGNS:  Weight 150 pounds, blood pressure  120/60, heart rate 60.   LABORATORY DATA:  No new laboratory data at this time.   ASSESSMENT:  [name] [name] to the Lipid Clinic today with no chest pain  or shortness of breath.  She has very minimal muscle aches and pains.  She  says that they are just now finally starting to go away after being on  Pravachol.  She has become intolerant to all statin medications with  myalgias.  She is feeling great on Zetia and is very excited that there is  finally a medication that is not causing any myalgias for her.  There are no  labs prior to this visit.  It was a follow up on lifestyle modification and  adverse events regarding medication therapy.  She has done very little  exercise in the last month since she was here.  She had previously been  walking two to three miles approximately three times a week.  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  Her diet remains to be heart healthy, low fat, low carbohydrate, lots of  fruits and vegetables.  Her one vice is that she is continuing to smoke.  She does want to quit and a Chantix prescription was given to her.   However,  she started reading about it, does not want to be on the medication for a  year.  After a discussion with her, she is agreeable to starting Chantix and  we will continue to follow her closely regarding her tobacco cessation.   PLAN:  1. Continue Zetia 10 mg daily.  2. Continue heart healthy diet.  3. Restart exercise regimen whenever time allows.  4. Follow up visit in three months for lipid panel and liver function      tests.  We will make adjustments that will be needed at that time.                                  Leota Sauers, PharmD  Thomas C. Daleen Squibb, MD, [facility]   LC/MedQ  DD:  01/25/2006  DT:  01/25/2006  Job #:  [id number]

## 2010-11-04 NOTE — Assessment & Plan Note (Signed)
[facility]                               LIPID CLINIC NOTE   NAME:[name] [name] TAKEDA                      MRN:          [id number]  DATE:10/04/2006                            DOB:          [birth year]    The patient is seen back in the cholesterol clinic for management of her  hyperlipidemia associated with high-risk features and multiple  medication intolerance. The patient states she has been feeling well and  doing well overall. She has tolerated her Zetia without issue. She  smoked her last cigarette on January 24 and has used Chantix for 2  months and 4 days and then been able to discontinue that therapy as  well. She has worked to follow a low fat low cholesterol diet and she  has not been exercising as much but is in the process of rejoining the  [employer]. She does enjoy swimming. She does have some periodic soreness all  over but it is very tolerable right now. She also shares that she is  getting remarried in August of this year and her fiance enjoys golf and  is working to get her out on the golf course walking with him on a  regular basis.   PAST MEDICAL HISTORY:  Pertinent for past tobacco use, hyperlipidemia.   CURRENT MEDICATIONS:  1. Levothyroxine 0.05 mg daily.  2. Aspirin 81 mg daily.  3. Fish oil 1 gram daily.  4. Zetia 10 mg daily.  5. Vitamin D 1200 international units daily.   REVIEW OF SYSTEMS:  As stated in the HPI and otherwise negative.   PHYSICAL EXAMINATION:  VITAL SIGNS:  Weight today is 157 pounds. Blood  pressure is 128/62, heart rate is 72.   LABORATORY DATA:  Labs on September 25, 2006 revealed total cholesterol 200,  triglyceride 136, HDL 63.6, LDL 109.9, HDL cholesterol is 136. LFTs are  within normal limits.   ASSESSMENT:  The patient is feeling and doing well. She looks the best  she has in some time.   PLAN:  The patient will work to increase her walking. She will work to  continue her low fat low cholesterol diet.  She will continue her Zetia  10 mg daily and we will see her back in 4 months. The patient agrees  with this plan and will call with questions or problems in the meantime.      Shelby Dubin, PharmD, BCPS, CPP  Electronically Signed      Rollene Rotunda, MD, [facility]  Electronically Signed   MP/MedQ  DD: 10/05/2006  DT: 10/05/2006  Job #: [id number]   cc:   Madolyn Frieze. Jens Som, MD, [facility]  Scott M. Kriste Basque, MD

## 2010-11-04 NOTE — Assessment & Plan Note (Signed)
[facility]                              CARDIOLOGY OFFICE NOTE   NAME:WHITE, GIANELLA CHISMAR                      MRN:          [id number]  DATE:12/28/2005                            DOB:          11/28/1940    Return office visit for lipid clinic.   PAST MEDICAL HISTORY:  1.  Hyperlipidemia.  2.  Hypothyroidism.   CURRENT MEDICATIONS:  1.  Synthroid 0.05 mg daily.  2.  Aspirin 81 mg daily.  3.  Fish oil 1 g daily.  4.  Multivitamin daily.  5.  Pravachol 20 mg daily.   MEDICATION INTOLERANCES:  CRESTOR, VYTORIN, LIPITOR, PRAVACHOL have all  given her myalgias.   Vital signs:  Weight 150 pounds, blood pressure 100/65, heart rate 72.   ASSESSMENT:  [name] is a pleasant woman who returns to lipid clinic today  with aches and pains in her muscles.  I have instructed her to stop her  Pravachol.  She says she has had it since she started her Pravachol a few  months ago at last appointment here.  Her plan is to quit smoking.  She is  down to 10-12 cigarettes a day.  She says that this is her last pack.  I  have given her a prescription for Chantix and hopefully this will help her.  I have discussed things for her to do when she has triggers or wants of a  cigarette.  We have discussed healthy foods for her to snack on.  She is  going to increase her vegetables whenever she is increasing her snacks after  quitting smoking instead of snacking on unhealthy foods.  She has been  exercising by walking 2-3 miles two to three times a week.  I have  encouraged her to increase this to three to four times a week and keep this  on a consistent basis.  Her total cholesterol is 195, triglycerides 109, HDL  62, LDL 112.  Lipid panel is at goal; however, she is unable to tolerate the  Pravachol.  LFTs within normal limits.   PLAN:  1.  Discontinue Pravachol.  Begin Zetia 10 mg daily.  Samples were given.  2.  Stop smoking.  3.  Add fruits and vegetables to diet  and lean protein.  4.  Continue exercise regimen.  Increase to three to four times a week.  5.  Follow-up visit in six weeks for LFTs and lipid panel and make any      assessments regarding Zetia therapy for her hyperlipidemia.                                  Leota Sauers, PharmD    LC/MedQ  DD:  01/01/2006  DT:  01/01/2006  Job #:  [id number]

## 2010-11-04 NOTE — Assessment & Plan Note (Signed)
[facility]                                 LIPID CLINIC NOTE   NAME:[name] [name], [name] [name]                      MRN:          [id number]  DATE:04/19/2006                            DOB:          31-Jul-1940    PAST MEDICAL HISTORY:  Hyperlipidemia and hypothyroidism.   MEDICATIONS:  1. Synthroid 50 mcg daily.  2. Aspirin 81 mg daily.  3. Fish oil 1 gram daily.  4. Multivitamin daily.  5. Zetia 10 mg daily.   VITAL SIGNS:  Weight 154 pounds.  Blood pressure 102/70, heart rate 60.   LABORATORY DATA:  Total cholesterol 190, triglycerides 66, HDL 61, LDL 116.  LFTs within normal limits.   ASSESSMENT:  Ms. [name] [name] is a very pleasant woman who returns to Lipid Clinic  today with no muscle pain, no chest pain, no shortness of breath.  She is  compliant with medication regimen.  She is very excited.  She reports that  she is tolerating Zetia well with no problems and will continue taking her  Zetia 10 mg daily.  She has in the past been very intolerant to statin  medications with increased LFTs and myalgias.  She exercises by walking two  to three miles two to three times a week.  She has increased fats in her  diet over the last two weeks while her grandkids have been visiting;  however, she normally eats a fairly low-fat diet, lots of fruits and  vegetables and low carbohydrates.  Her biggest vice that she has continued  to smoke.  She is willing to quit and understands the need to quit.  She has  not yet picked a quit day.  That is her homework for the next visit is to 1)  pick a quit date, 2) she refuses Chantix and therefore I have given her  options of Wellbutrin, Commit lozenges or Nicorette gum.  She is to pick one  mode and we will further discuss this at her next appointment.   Her total cholesterol goal is less than 200, triglycerides has a goal of  less than 150, HDL a goal of greater than 40 and LDL a goal of less than  130.  However, we will  stress the importance of pushing her LDLs close to  100 as possible.  She is at decreased risk of cardiac disease.   PLAN:  1. No medication changes at this time.  2. Decrease fats in diet.  3. Continue exercise regimen.  4. Pick a quit date and mode of medication to use, either Commit,      Nicorette or Wellbutrin and also what she will do when she has the      trigger with a want for a cigarette.  5. Follow-up visit in six moths for lipid panel and LFTs and make      adjustments with me at that time.      Leota Sauers, PharmD  Electronically Signed      Jesse Sans. Daleen Squibb, MD, [facility]  Electronically Signed   LC/MedQ  DD: 04/19/2006  DT: 04/19/2006  Job #: [id number]

## 2010-11-09 ENCOUNTER — Other Ambulatory Visit (HOSPITAL_COMMUNITY): Payer: Self-pay | Admitting: Gynecology

## 2010-11-09 DIAGNOSIS — Z1231 Encounter for screening mammogram for malignant neoplasm of breast: Secondary | ICD-10-CM

## 2010-11-18 ENCOUNTER — Ambulatory Visit (HOSPITAL_COMMUNITY)
Admission: RE | Admit: 2010-11-18 | Discharge: 2010-11-18 | Disposition: A | Discharge status: Home / Self Care | Payer: Medicare HMO | Source: Ambulatory Visit | Attending: Gynecology | Admitting: Gynecology

## 2010-11-18 DIAGNOSIS — Z1231 Encounter for screening mammogram for malignant neoplasm of breast: Secondary | ICD-10-CM | POA: Insufficient documentation

## 2011-01-02 ENCOUNTER — Other Ambulatory Visit (INDEPENDENT_AMBULATORY_CARE_PROVIDER_SITE_OTHER): Payer: Medicare HMO | Admitting: *Deleted

## 2011-01-02 DIAGNOSIS — Z79899 Other long term (current) drug therapy: Secondary | ICD-10-CM

## 2011-01-02 DIAGNOSIS — E7800 Pure hypercholesterolemia, unspecified: Secondary | ICD-10-CM

## 2011-01-02 LAB — HEPATIC FUNCTION PANEL
AST: 22 U/L (ref 0–37)
Total Bilirubin: 0.4 mg/dL (ref 0.3–1.2)

## 2011-01-02 LAB — LIPID PANEL
Total CHOL/HDL Ratio: 5
Triglycerides: 172 mg/dL — ABNORMAL HIGH (ref 0.0–149.0)

## 2011-01-02 LAB — LDL CHOLESTEROL, DIRECT: Direct LDL: 191.3 mg/dL

## 2011-01-05 ENCOUNTER — Ambulatory Visit (INDEPENDENT_AMBULATORY_CARE_PROVIDER_SITE_OTHER): Payer: Medicare HMO

## 2011-01-05 VITALS — Wt 156.2 lb

## 2011-01-05 DIAGNOSIS — E7800 Pure hypercholesterolemia, unspecified: Secondary | ICD-10-CM

## 2011-01-05 NOTE — Assessment & Plan Note (Signed)
Pt's cholesterol significantly worse off medications.  TC- 259 (goal<200), TG- 172 (goal<150), HDL- 53.3 (goal>45) and LDL-191 (goal<100).  LDL was 121 in April.  Will restart Zetia.  Pt has had many intolerances and Zetia is one of the only options.  Pain likely due to ortho issues.  Will increase fish oil back to 2 gm daily.  Encouraged pt to work on diet and exercise.  Will f/u in 3 months.

## 2011-01-05 NOTE — Patient Instructions (Signed)
Restart Zetia 10mg  daily and fish oil 2 capsules daily  Try to limit your starchy vegetables and eat more grilled fish and fiber-rich vegetables   Recheck labs in 3 months.

## 2011-01-05 NOTE — Progress Notes (Signed)
HPI  Patient returns to clinic for dyslipidemia follow up. She reports stopping her Zetia approximately 1 month ago due to pains.   At this time, she also had issues with her sciatic nerve and required muscle relaxers.  The pain is somewhat better but she is seeing her orthopedic MD next week to discuss possible spinal injection.  She also decreased her fish oil to only 1 gm daily.  She did hear that metamucil will help with your cholesterol and has started taking it every day.  In regards to diet, she is attempting to watch what she eats, stating that she doesn't eat fast food anymore.  She only eats [employer] french fries when she is traveling.  She is still skipping meals.  For dinner she may have chicken or fish (sometimes fried) and vegetables/sides such as spinach, corn, green beans with fatback, macaroni and cheese, or dumplings.  She does like to snack on a lot of cheese and has increased this since last time.  Pt has not exercised any due to pain.   Current Outpatient Prescriptions on File Prior to Visit  Medication Sig Dispense Refill  . aspirin EC 81 MG EC tablet Take 81 mg by mouth daily.        . Calcium Carbonate-Vitamin D (CALTRATE 600+D) 600-400 MG-UNIT per tablet Take 1 tablet by mouth daily.        . Cholecalciferol (VITAMIN D) 1000 UNITS capsule Take 2,000 Units by mouth daily.        . Levothyroxine Sodium 75 MCG CAPS Take 75 mcg by mouth daily.        . Multiple Vitamins-Minerals (WOMENS MULTI VITAMIN & MINERAL) TABS Take 1 tablet by mouth daily.        Marland Kitchen ezetimibe (ZETIA) 10 MG tablet Take 10 mg by mouth daily.        . Omega-3 Fatty Acids (FISH OIL) 1000 MG CAPS Take 1 capsule by mouth. 2-3 capsules daily

## 2011-04-03 ENCOUNTER — Other Ambulatory Visit (INDEPENDENT_AMBULATORY_CARE_PROVIDER_SITE_OTHER): Payer: Medicare HMO | Admitting: *Deleted

## 2011-04-03 ENCOUNTER — Other Ambulatory Visit: Payer: Self-pay | Admitting: Pulmonary Disease

## 2011-04-03 DIAGNOSIS — E7800 Pure hypercholesterolemia, unspecified: Secondary | ICD-10-CM

## 2011-04-03 LAB — LIPID PANEL
Cholesterol: 247 mg/dL — ABNORMAL HIGH (ref 0–200)
Total CHOL/HDL Ratio: 4
Triglycerides: 123 mg/dL (ref 0.0–149.0)
VLDL: 24.6 mg/dL (ref 0.0–40.0)

## 2011-04-03 LAB — LDL CHOLESTEROL, DIRECT: Direct LDL: 163.7 mg/dL

## 2011-04-03 LAB — HEPATIC FUNCTION PANEL
AST: 18 U/L (ref 0–37)
Albumin: 3.8 g/dL (ref 3.5–5.2)
Alkaline Phosphatase: 48 U/L (ref 39–117)
Total Protein: 6.8 g/dL (ref 6.0–8.3)

## 2011-04-06 ENCOUNTER — Ambulatory Visit (INDEPENDENT_AMBULATORY_CARE_PROVIDER_SITE_OTHER): Payer: Medicare HMO

## 2011-04-06 VITALS — BP 138/74 | Wt 156.0 lb

## 2011-04-06 DIAGNOSIS — E7800 Pure hypercholesterolemia, unspecified: Secondary | ICD-10-CM

## 2011-04-06 NOTE — Assessment & Plan Note (Signed)
Lipid values are as follows:  TC 247 (prev 259, goal <200), TG 123 (prev 172, goal <150), HDL 58 (prev 53, goal >45), LDL 164 (prev 191, goal <100).  LFTs WNL.  Patient was pleased with her lipid panel today, as she anticipated the results to be much worse, given she has been off zetia for 3 months.  Lipid panel has actually improved and LFTs are still WNL; however, LDL is still above goal.  Patient is not interested in starting a new medication at this time due to previously bad experiences.  She would like to work on exercise and maintaining a healthy diet.  I have encouraged her to increase fiber by resuming her metamucil supplement.  If LDL is not improved at next visit, I have told pt that we will consider adding a new statin, will attempt one she has not tried in the past.  She seems hesitant about this option, but we will discuss at next visit.   Plan: 1)  Continue krill oil 2)  Consider adding metamucil to increase fiber 3)  Attempt to exercise at least twice per week 4)  Follow-up in 3 months 2)

## 2011-04-06 NOTE — Patient Instructions (Signed)
1) Continue current supplements, Krill Oil 2)  Continue to maintain a healthy diet, low in fat and high in fiber 3)  Consider resuming metamucil supplement in order to increase fiber 4)  Increase exercise to at least twice per week 5)  Follow-up in 3 months  Labwork: Wednesday, January 16th, 2013 @ 8:30 am (FASTING)  Appt: Thursday, January 17th, 2013 @ 9:30 am

## 2011-04-06 NOTE — Progress Notes (Signed)
[name] [name] is a 70 y/o female presenting to lipid clinic for follow-up of dyslipidemia.  Pt was restarted on Zetia at last follow-up, 3 months ago, and was able to tolerate zetia for ~3 days due to extreme leg fatigue.  Pt describes this as extreme weakness of her left leg.  After she d/c zetia, she reports all symptoms resolving and no additional issues.  She is currently taking only Krill Oil daily and a supplement containing Vitamin C, Calcium, and Magnesium that she takes for her joints.    Patient's diet remains much the same.  She attempts to eat low fat foods, avoids fried foods, and attempts to eat plenty of vegetables.  She also reports eating more salmon.  Patient reports that she does snack on cheese regularly and this is something she does not wish to limit.   No routine exercise at this time.  Patient reports that she has an entire room of exercise equipment that she could use.  She also reports having trouble walking on an incline so this is a limiting factor for the patient.   She is able to tolerate walking on level ground.  Patient understands the importance of exercise, but it is very difficult to encourage her to actually begin a routine.  I have encouraged her to attempt exercise three times per week.

## 2011-07-05 ENCOUNTER — Other Ambulatory Visit: Payer: Medicare HMO | Admitting: *Deleted

## 2011-07-06 ENCOUNTER — Other Ambulatory Visit (INDEPENDENT_AMBULATORY_CARE_PROVIDER_SITE_OTHER): Payer: Medicare HMO | Admitting: *Deleted

## 2011-07-06 ENCOUNTER — Ambulatory Visit: Payer: Medicare HMO

## 2011-07-06 DIAGNOSIS — E7800 Pure hypercholesterolemia, unspecified: Secondary | ICD-10-CM

## 2011-07-06 LAB — LIPID PANEL
HDL: 63.2 mg/dL (ref >39.00)
Triglycerides: 74 mg/dL (ref 0.0–149.0)
VLDL: 14.8 mg/dL (ref 0.0–40.0)

## 2011-07-06 LAB — HEPATIC FUNCTION PANEL
Albumin: 4.2 g/dL (ref 3.5–5.2)
Alkaline Phosphatase: 55 U/L (ref 39–117)
Total Protein: 7 g/dL (ref 6.0–8.3)

## 2011-07-10 ENCOUNTER — Ambulatory Visit (INDEPENDENT_AMBULATORY_CARE_PROVIDER_SITE_OTHER): Payer: Medicare HMO | Admitting: Pharmacist

## 2011-07-10 VITALS — BP 120/64 | Wt 159.0 lb

## 2011-07-10 DIAGNOSIS — E7800 Pure hypercholesterolemia, unspecified: Secondary | ICD-10-CM

## 2011-07-10 NOTE — Assessment & Plan Note (Addendum)
Lipid values are as follows: TC 244 (prev 247, goal <200), TG 74 (prev 123, goal <150), HDL 63 (prev 58, goal >45), LDL 166 (prev 164, goal <100). LFTs WNL. Patient was pleased with her lipid panel today, as she anticipated the results to be much worse, given she was not on her normal diet or exercise routine during the holidays. LFTs are still WNL; however, LDL is still above goal. Patient is not interested in starting a new medication at this time due to previously bad experiences. She has developed a good exercise routine and wants to see how this helps along with her diet changes after the holidays. If LDL is not improved at next visit, I have told pt that we will consider adding a new statin if the LDL levels worsen, will attempt one she has not tried in the past (pravachol). She seems hesitant about this option, but agrees that if diet and exercise does not work will have to add another agent.   Plan:  1) Continue krill oil 1000 mg daily 2)  Continue healthier diet of low fat foods and  Vegetables 3) Continue to work out at home on exercise machines and walking mat. Work up to a goal of 15 minutes per day most days of the week 4) Follow-up in 3 months

## 2011-07-10 NOTE — Assessment & Plan Note (Signed)
Lipid values are as follows: TC 244 (prev 247, goal <200), TG 74 (prev 123, goal <150), HDL 63 (prev 58, goal >45), LDL 166 (prev 164, goal <100). LFTs WNL. Patient was pleased with her lipid panel today, as she anticipated the results to be much worse, given she was not on her normal diet or exercise routine during the holidays. LFTs are still WNL; however, LDL is still above goal. Patient is not interested in starting a new medication at this time due to previously bad experiences. She has developed a good exercise routine and wants to see how this helps along with her diet changes after the holidays. If LDL is not improved at next visit, I have told pt that we will consider adding a new statin if the LDL levels worsen, will attempt one she has not tried in the past (pravachol). She seems hesitant about this option, but agrees that if diet and exercise does not work will have to add another agent. Recommend to set date in near future to quite smoking. Patient has phone number of clinic to call with questions regarding smoking cessation options when the time comes.

## 2011-07-10 NOTE — Patient Instructions (Signed)
Continue Fish oil 1 capsule daily.  Try getting back to a regular diet, concentrate on healthy choices that are low in fat, and incorporate more vegetables.  Continue walking and working on your machines.  Work up to a goal of 15 minutes per day most days of the week.  Recheck in 3 months.  For now, try these diet changes and regular exercise routine.  May consider adding a new medication at next appointment if cholesterol does not improve or worsens.

## 2011-07-10 NOTE — Progress Notes (Addendum)
HPI:       Mrs. Jolliffe is a 71 y/o female presenting to lipid clinic for follow-up of dyslipidemia. Pt was taken off Zetia at last follow-up, 3 months ago due to extreme leg fatigue. Pt describes this as extreme weakness of her left leg. After she d/c zetia, she reports all symptoms resolving and no additional issues. She is currently taking only Krill Oil daily and a supplement containing Vitamin C, Calcium, and Magnesium that she takes for her joints.   Patient's diet remains much the same. She attempts to eat low fat foods, avoids fried foods, and attempts to eat plenty of vegetables. She also reports continuing to eat salmon. Patient reports that she does snack on cheese regularly but has been purchasing the "low fat cheese". Mrs. Dredge states that she is getting back on track with her diet now as the holidays are over   Patient reports that she has an entire room of exercise equipment that she could use and over the last two weeks has started using the equipment on a regular basis. Patient understands the importance of exercise, and is excited about this new work out regimen. She walks on an "accupucture" mat twice daily for 5 minutes each time. Her goal is to increase this to 15 minutes twice a day. She also exercises 3 times a week on a stationary bicycle and stair step. She works out for 10 minutes on each machine in her exercise room. She still continues to smoke and admits that the stress of the holidays has increased her cravings. She smokes about "2/3's of a pack" per day. She plans to set a quit date in the near future as her husband wants her to quit and she wants to quit to improve her health.  Current Outpatient Prescriptions on File Prior to Visit  Medication Sig Dispense Refill  . aspirin EC 81 MG EC tablet Take 81 mg by mouth daily.        . Calcium Carbonate-Vitamin D (CALTRATE 600+D) 600-400 MG-UNIT per tablet Take 1 tablet by mouth daily.        . Cholecalciferol (VITAMIN D) 1000  UNITS capsule Take 2,000 Units by mouth daily.        . Levothyroxine Sodium 75 MCG CAPS Take 75 mcg by mouth daily.        . Multiple Vitamins-Minerals (WOMENS MULTI VITAMIN & MINERAL) TABS Take 1 tablet by mouth daily.        . Omega-3 Fatty Acids (FISH OIL) 1000 MG CAPS Take 1 capsule by mouth. 2-3 capsules daily        Allergies  Allergen Reactions  . Atorvastatin     REACTION: INTOL to Lipitor \\T \ all STATINS  . Ezetimibe     REACTION: INTOL to Zetia  . Rosuvastatin     REACTION: intolerant

## 2011-08-17 ENCOUNTER — Ambulatory Visit (INDEPENDENT_AMBULATORY_CARE_PROVIDER_SITE_OTHER)
Admission: RE | Admit: 2011-08-17 | Discharge: 2011-08-17 | Disposition: A | Discharge status: Home / Self Care | Payer: Medicare HMO | Source: Ambulatory Visit | Attending: Pulmonary Disease | Admitting: Pulmonary Disease

## 2011-08-17 ENCOUNTER — Encounter: Payer: Self-pay | Admitting: Pulmonary Disease

## 2011-08-17 ENCOUNTER — Ambulatory Visit (INDEPENDENT_AMBULATORY_CARE_PROVIDER_SITE_OTHER): Payer: Medicare HMO | Admitting: Pulmonary Disease

## 2011-08-17 ENCOUNTER — Other Ambulatory Visit (INDEPENDENT_AMBULATORY_CARE_PROVIDER_SITE_OTHER): Payer: Medicare HMO

## 2011-08-17 VITALS — BP 120/78 | HR 79 | Temp 96.9°F | Ht 67.0 in | Wt 161.0 lb

## 2011-08-17 DIAGNOSIS — E7800 Pure hypercholesterolemia, unspecified: Secondary | ICD-10-CM

## 2011-08-17 DIAGNOSIS — R300 Dysuria: Secondary | ICD-10-CM

## 2011-08-17 DIAGNOSIS — K5730 Diverticulosis of large intestine without perforation or abscess without bleeding: Secondary | ICD-10-CM

## 2011-08-17 DIAGNOSIS — F17200 Nicotine dependence, unspecified, uncomplicated: Secondary | ICD-10-CM

## 2011-08-17 DIAGNOSIS — E039 Hypothyroidism, unspecified: Secondary | ICD-10-CM

## 2011-08-17 DIAGNOSIS — I8390 Asymptomatic varicose veins of unspecified lower extremity: Secondary | ICD-10-CM

## 2011-08-17 DIAGNOSIS — E559 Vitamin D deficiency, unspecified: Secondary | ICD-10-CM

## 2011-08-17 LAB — CBC WITH DIFFERENTIAL/PLATELET
Basophils Relative: 0.8 % (ref 0.0–3.0)
Eosinophils Relative: 1.6 % (ref 0.0–5.0)
HCT: 43.7 % (ref 36.0–46.0)
Lymphs Abs: 2 10*3/uL (ref 0.7–4.0)
MCV: 90.4 fl (ref 78.0–100.0)
Monocytes Absolute: 0.5 10*3/uL (ref 0.1–1.0)
RBC: 4.84 Mil/uL (ref 3.87–5.11)
WBC: 7.9 10*3/uL (ref 4.5–10.5)

## 2011-08-17 LAB — HEPATIC FUNCTION PANEL: Total Bilirubin: 0.4 mg/dL (ref 0.3–1.2)

## 2011-08-17 LAB — BASIC METABOLIC PANEL
BUN: 19 mg/dL (ref 6–23)
Creatinine, Ser: 0.9 mg/dL (ref 0.4–1.2)
GFR: 68.84 mL/min (ref >60.00)

## 2011-08-17 LAB — URINALYSIS, ROUTINE W REFLEX MICROSCOPIC
Ketones, ur: NEGATIVE
Specific Gravity, Urine: <=1.005 (ref 1.000–1.030)
Total Protein, Urine: NEGATIVE
Urine Glucose: NEGATIVE

## 2011-08-17 MED ORDER — LEVOTHYROXINE SODIUM 75 MCG PO CAPS: 75.0000 ug | ORAL_CAPSULE | Freq: Every day | ORAL | Status: DC

## 2011-08-17 NOTE — Patient Instructions (Signed)
Today we updated your med list in our EPIC system...    Continue your current medications the same...    We refilled your med for 2013...  Today we did your follow up CXR & fasting blood work...    Please call the PHONE TREE in a few days for your results...    Dial [id number] & when prompted enter your patient number followed by the # symbol...    Your patient number is:  [id number]#  Stay on your low cholesterol low fat diet & exercise program...  Call for any questions...  Let's continue our yearly check ups & let me know if you are having any problems.Marland KitchenMarland Kitchen
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
## 2011-08-18 LAB — VITAMIN D 25 HYDROXY (VIT D DEFICIENCY, FRACTURES): Vit D, 25-Hydroxy: 71 ng/mL (ref 30–89)

## 2011-09-12 ENCOUNTER — Other Ambulatory Visit: Payer: Self-pay | Admitting: Pharmacist

## 2011-09-12 DIAGNOSIS — E7800 Pure hypercholesterolemia, unspecified: Secondary | ICD-10-CM

## 2011-10-02 ENCOUNTER — Other Ambulatory Visit (INDEPENDENT_AMBULATORY_CARE_PROVIDER_SITE_OTHER): Payer: Medicare HMO

## 2011-10-02 DIAGNOSIS — E7800 Pure hypercholesterolemia, unspecified: Secondary | ICD-10-CM

## 2011-10-02 LAB — HEPATIC FUNCTION PANEL
ALT: 22 U/L (ref 0–35)
AST: 21 U/L (ref 0–37)
Albumin: 4 g/dL (ref 3.5–5.2)
Alkaline Phosphatase: 50 U/L (ref 39–117)
Total Protein: 6.7 g/dL (ref 6.0–8.3)

## 2011-10-02 LAB — LIPID PANEL: Cholesterol: 216 mg/dL — ABNORMAL HIGH (ref 0–200)

## 2011-10-03 ENCOUNTER — Ambulatory Visit: Payer: Medicare HMO

## 2011-10-03 ENCOUNTER — Ambulatory Visit (INDEPENDENT_AMBULATORY_CARE_PROVIDER_SITE_OTHER): Payer: Medicare HMO | Admitting: Pharmacist

## 2011-10-03 VITALS — BP 138/70

## 2011-10-03 DIAGNOSIS — E7800 Pure hypercholesterolemia, unspecified: Secondary | ICD-10-CM

## 2011-10-03 NOTE — Assessment & Plan Note (Signed)
CV Risk Assessment- Risk Factors: age, tobacco use, HTN (?) Positive Factors: high HDL TC goal <200, HDL goal >45, LDL goal <130, TG goal <150  Recommendations/Plan- 1. Continue taking krill oil 1000mg  daily. 2. Continue to maintain a heart healthy diet full of vegetables and low in fried foods. 3. Continue to stay as physically active as possible. 4. Let us know when you are ready to quit smoking. 5. Follow-up visit and labs in 6 months.

## 2011-10-03 NOTE — Patient Instructions (Signed)
Congratulations on your improved cholesterol numbers!  Continue taking krill oil 1000mg  daily.  Continue to maintain a diet abundant in fruits/vegetables and low in fried foods.  We are happy to discuss nicotine replacement options with you when you are ready to quit smoking.  Return for follow-up and labwork in 6 months.

## 2011-10-03 NOTE — Progress Notes (Signed)
Subjective-    [name] [name] presented today in good spirits for 3 month follow-up of her lipids.  She has a notable history of intolerance to multiple statins and zetia.  Currently, she has only been taking krill oil ([employer], 435mg  EPA & 565mg  DHA per 2 capsules).  Of note, she has been dealing with challenging and stressful family circumstances recently.  She continues to smoke ~1/2 ppd and knows she needs to quit, but still isn't completely ready yet.  She did state that her previous quit attempt lasted 30 days when she used the nicotine patch, but eventually developed a significant rash that prevented further use.  Diet- She continues to maintain a heart healthy diet full of fruits & vegetables and avoids fried foods.  Since last visit, she has decreased her cheese intake and has been eating more fish.  She reports eating out 4-5x/week, usually for lunch.   Exercise- Due to increasing sciatic/leg pain and stressful circumstances, she has not been using her exercise equipment.  However, she reports doing a lot of yardwork and gardening recently, which involves some heavy lifting.  Lipid Panel     Component Value Date/Time   CHOL 216* 10/02/2011 1053   TRIG 95.0 10/02/2011 1053   HDL 57.20 10/02/2011 1053   CHOLHDL 4 10/02/2011 1053   VLDL 19.0 10/02/2011 1053   LDLCALC 121* 10/03/2010 0920   Current Outpatient Prescriptions  Medication Sig Dispense Refill  . aspirin EC 81 MG EC tablet Take 81 mg by mouth daily.        . Calcium Carb-Cholecalciferol (CALCIUM 1000 + D PO) Take 1 tablet by mouth daily.      . Cholecalciferol (VITAMIN D) 1000 UNITS capsule Take 2,000 Units by mouth daily.        Marland Kitchen KRILL OIL 1000 MG CAPS Take 2,000 mg by mouth daily.      . Levothyroxine Sodium 75 MCG CAPS Take 1 capsule (75 mcg total) by mouth daily.  90 capsule  3  . Multiple Vitamins-Minerals (WOMENS MULTI VITAMIN & MINERAL) TABS Take 3 tablets by mouth daily.

## 2011-10-13 ENCOUNTER — Other Ambulatory Visit: Payer: Self-pay | Admitting: Pulmonary Disease

## 2011-10-13 DIAGNOSIS — Z1231 Encounter for screening mammogram for malignant neoplasm of breast: Secondary | ICD-10-CM

## 2011-11-20 ENCOUNTER — Ambulatory Visit (HOSPITAL_COMMUNITY)
Admission: RE | Admit: 2011-11-20 | Discharge: 2011-11-20 | Disposition: A | Discharge status: Home / Self Care | Payer: Medicare HMO | Source: Ambulatory Visit | Attending: Pulmonary Disease | Admitting: Pulmonary Disease

## 2011-11-20 DIAGNOSIS — Z1231 Encounter for screening mammogram for malignant neoplasm of breast: Secondary | ICD-10-CM | POA: Insufficient documentation

## 2011-12-26 ENCOUNTER — Telehealth: Payer: Self-pay | Admitting: Pulmonary Disease

## 2011-12-26 MED ORDER — LEVOFLOXACIN 500 MG PO TABS: 500.0000 mg | ORAL_TABLET | Freq: Every day | ORAL | Status: AC

## 2011-12-26 NOTE — Telephone Encounter (Signed)
I spoke with pt and is aware of SN recs. RX has been sent to the pharmacy.

## 2011-12-26 NOTE — Telephone Encounter (Signed)
Spoke with pt . Pt having chest congestion ,productive cough , ST  Denies any fever was exposed to carbon monoxide little over 2 weeks ago.doesnt think this is related. Requesting something be called into the [facility] [address] . Allergies  Allergen Reactions  . Atorvastatin     REACTION: INTOL to Lipitor \\T \ all STATINS  . Ezetimibe     REACTION: INTOL to Zetia  . Rosuvastatin     REACTION: intolerant   Dr Kriste Basque please advise  Thank you

## 2011-12-26 NOTE — Telephone Encounter (Signed)
lmomtcb x1 for pt at both #'s listed 

## 2011-12-26 NOTE — Telephone Encounter (Signed)
Per SN---rest, increase fluids, take the mucinex 2 po bid and levaquin 500mg   #7  1 daily until gone.  thanks

## 2012-01-04 ENCOUNTER — Telehealth: Payer: Self-pay | Admitting: Pulmonary Disease

## 2012-01-04 NOTE — Telephone Encounter (Signed)
I spoke with pt and made her aware of this. She voiced her understanding and had no questions

## 2012-01-04 NOTE — Telephone Encounter (Signed)
i have not called this pt.   thanks

## 2012-01-04 NOTE — Telephone Encounter (Signed)
Leigh, please advise if you called this patient; I do not see any results to be called or other phone notes. Thanks.

## 2012-02-22 ENCOUNTER — Other Ambulatory Visit: Payer: Self-pay | Admitting: Pharmacist

## 2012-02-22 ENCOUNTER — Other Ambulatory Visit (INDEPENDENT_AMBULATORY_CARE_PROVIDER_SITE_OTHER): Payer: Medicare HMO

## 2012-02-22 ENCOUNTER — Other Ambulatory Visit: Payer: Medicare HMO

## 2012-02-22 DIAGNOSIS — E785 Hyperlipidemia, unspecified: Secondary | ICD-10-CM

## 2012-02-22 LAB — LIPID PANEL
Cholesterol: 248 mg/dL — ABNORMAL HIGH (ref 0–200)
Total CHOL/HDL Ratio: 5
VLDL: 30 mg/dL (ref 0.0–40.0)

## 2012-02-22 LAB — HEPATIC FUNCTION PANEL
AST: 18 U/L (ref 0–37)
Alkaline Phosphatase: 52 U/L (ref 39–117)
Bilirubin, Direct: 0 mg/dL (ref 0.0–0.3)
Total Bilirubin: 0.6 mg/dL (ref 0.3–1.2)

## 2012-02-26 ENCOUNTER — Ambulatory Visit: Payer: Medicare HMO | Admitting: Pharmacist

## 2012-02-27 ENCOUNTER — Ambulatory Visit (INDEPENDENT_AMBULATORY_CARE_PROVIDER_SITE_OTHER): Payer: Medicare HMO | Admitting: Pharmacist

## 2012-02-27 VITALS — Wt 160.8 lb

## 2012-02-27 DIAGNOSIS — E7800 Pure hypercholesterolemia, unspecified: Secondary | ICD-10-CM
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
## 2012-02-27 NOTE — Patient Instructions (Signed)
Restart fish oil you were previously taking   Try to maintain a diet high in fiber and low in carbohydrates  Recheck labs in 6 months.

## 2012-02-27 NOTE — Assessment & Plan Note (Signed)
Pt's cholesterol worse since last visit.  TC- 248 (goal<200), TG- 150(goal<150), HDL- 53 (goal>45), and LDL- 161 (goal<130).  LFTs are WNL.  Pt would still prefer natural treatment rather than prescription medications.  She is going to switch back to her previous fish oil product and try to watch her diet more.  We will follow up in 6 months.

## 2012-06-05 ENCOUNTER — Telehealth: Payer: Self-pay | Admitting: Pulmonary Disease

## 2012-06-05 MED ORDER — DIPHENHYD-HYDROCORT-NYSTATIN MT SUSP: OROMUCOSAL | Status: DC

## 2012-06-05 MED ORDER — AMOXICILLIN-POT CLAVULANATE 875-125 MG PO TABS: 1.0000 | ORAL_TABLET | Freq: Two times a day (BID) | ORAL | Status: AC

## 2012-06-05 NOTE — Telephone Encounter (Signed)
lmomtcb x1 

## 2012-06-05 NOTE — Telephone Encounter (Signed)
Per SN----ok to call in augmentin 875 mg  #14 1 po bid   IF NOT PCN ALLERGIC and call in MMW  #4oz  1 tsp gargle and swallow four times daily as needed.   She will need to take align 1 daily and yogurt daily.  thanks

## 2012-06-05 NOTE — Telephone Encounter (Signed)
Spoke with patient-states she is having extreme sore throat with swollen glands(tender to touch), dry cough for the most part-will have white phlegm at times, denies any SOB, wheezing, or ear pressure/pain. Pt would like to have RX from SN for this. Please advise. Thanks.

## 2012-06-05 NOTE — Telephone Encounter (Signed)
Patient returning call.

## 2012-06-05 NOTE — Telephone Encounter (Signed)
Spoke with patient-aware of rx's from SN. Sent to pharmacy.

## 2012-07-15 ENCOUNTER — Telehealth: Payer: Self-pay | Admitting: Pulmonary Disease

## 2012-07-15 DIAGNOSIS — Z01419 Encounter for gynecological examination (general) (routine) without abnormal findings: Secondary | ICD-10-CM

## 2012-07-15 NOTE — Telephone Encounter (Signed)
SN, is there a GYN office that you would like to refer the pt to since Dr. Nicholas Lose is retiring?  Please advise, thanks!

## 2012-07-16 NOTE — Telephone Encounter (Signed)
Pt aware. Nothing further was needed 

## 2012-07-16 NOTE — Telephone Encounter (Signed)
Order has been placed for our office to do the referral for a new GYN office.  thanks

## 2012-08-05 ENCOUNTER — Telehealth: Payer: Self-pay | Admitting: Pulmonary Disease

## 2012-08-05 NOTE — Telephone Encounter (Signed)
Called, spoke with pt.  Pt states she is scheduled to have oral surgery on this coming Wednesday am.  Pt states they asked her to stop smoking prior to this.  Pt states she is ready to quit smoking and would like something called in to help with this.  States she has used the higher dose patch in the past which worked well but reports a rash after stepping down to the lower dose.  Pt would like SN's recs and would like rx called in for whatever he feels would be beneficial to her.  She is requesting this be called in ASAP. Dr. Kriste Basque, pls advise.  Thank you.  Last OV with SN 08/17/11  Pending OV with SN 08/15/12  [facility] [address]  Allergies  Allergen Reactions  . Atorvastatin     REACTION: INTOL to Lipitor \\T \ all STATINS  . Ezetimibe     REACTION: INTOL to Zetia  . Rosuvastatin     REACTION: intolerant

## 2012-08-05 NOTE — Telephone Encounter (Signed)
Called, spoke with pt.  Informed her of below per Dr. Kriste Basque.  She verbalized understanding, will pick up the 21 mg patch otc to use daily, and is aware to call back if she has any problems or this doesn't work.  She verbalized understanding and voiced no further questions or concerns at this time.

## 2012-08-05 NOTE — Telephone Encounter (Signed)
Per SN---  Patches are all otc---rec to try 21 mg  Patches daily.  thanks

## 2012-08-05 NOTE — Telephone Encounter (Signed)
Returning call can be reached at [phone].[name] [name]

## 2012-08-05 NOTE — Telephone Encounter (Signed)
LMTCB x 1 for the pt  

## 2012-08-09 ENCOUNTER — Telehealth: Payer: Self-pay | Admitting: Pulmonary Disease

## 2012-08-09 MED ORDER — FLUOCINONIDE-E 0.05 % EX CREA: TOPICAL_CREAM | Freq: Three times a day (TID) | CUTANEOUS | Status: DC | PRN

## 2012-08-09 NOTE — Telephone Encounter (Signed)
Per SN---stop the patch and call in lidex e cream   1 tube  Apply to rash bid .   thanks

## 2012-08-09 NOTE — Telephone Encounter (Signed)
I spoke with pt. She stated she put on Nicoderm CQ patch Tuesday. When she first put it on she had burning sensation. The spot is now blood red, swollen, has 3 rash places. She used 2 other patches and the places are slight red and has slight rash. She called [facility] and was advised to stop using the patch and to call her doctor for further recs. Per pt the patch helped bc she has not smoked any cigs.  Please advise SN thanks Last OV 08/17/11 Pending 08/15/12 Allergies  Allergen Reactions  . Atorvastatin     REACTION: INTOL to Lipitor \\T \ all STATINS  . Ezetimibe     REACTION: INTOL to Zetia  . Rosuvastatin     REACTION: intolerant

## 2012-08-09 NOTE — Telephone Encounter (Signed)
I spoke with pt and is aware of SN recs. She voiced her understanding and needed nothing further

## 2012-08-15 ENCOUNTER — Other Ambulatory Visit (INDEPENDENT_AMBULATORY_CARE_PROVIDER_SITE_OTHER): Payer: Medicare HMO

## 2012-08-15 ENCOUNTER — Ambulatory Visit (INDEPENDENT_AMBULATORY_CARE_PROVIDER_SITE_OTHER)
Admission: RE | Admit: 2012-08-15 | Discharge: 2012-08-15 | Disposition: A | Discharge status: Home / Self Care | Payer: Medicare HMO | Source: Ambulatory Visit | Attending: Pulmonary Disease | Admitting: Pulmonary Disease

## 2012-08-15 ENCOUNTER — Encounter: Payer: Self-pay | Admitting: Pulmonary Disease

## 2012-08-15 ENCOUNTER — Ambulatory Visit (INDEPENDENT_AMBULATORY_CARE_PROVIDER_SITE_OTHER): Payer: Medicare HMO | Admitting: Pulmonary Disease

## 2012-08-15 VITALS — BP 142/62 | HR 72 | Temp 97.3°F | Ht 67.0 in | Wt 161.8 lb

## 2012-08-15 DIAGNOSIS — M1990 Unspecified osteoarthritis, unspecified site: Secondary | ICD-10-CM | POA: Insufficient documentation

## 2012-08-15 DIAGNOSIS — E039 Hypothyroidism, unspecified: Secondary | ICD-10-CM

## 2012-08-15 DIAGNOSIS — F17200 Nicotine dependence, unspecified, uncomplicated: Secondary | ICD-10-CM

## 2012-08-15 DIAGNOSIS — K5730 Diverticulosis of large intestine without perforation or abscess without bleeding: Secondary | ICD-10-CM

## 2012-08-15 DIAGNOSIS — E7800 Pure hypercholesterolemia, unspecified: Secondary | ICD-10-CM

## 2012-08-15 DIAGNOSIS — M545 Low back pain, unspecified: Secondary | ICD-10-CM

## 2012-08-15 DIAGNOSIS — J449 Chronic obstructive pulmonary disease, unspecified: Secondary | ICD-10-CM

## 2012-08-15 LAB — CBC WITH DIFFERENTIAL/PLATELET
Eosinophils Relative: 2.3 % (ref 0.0–5.0)
HCT: 39.8 % (ref 36.0–46.0)
Hemoglobin: 13.5 g/dL (ref 12.0–15.0)
Lymphs Abs: 2.2 10*3/uL (ref 0.7–4.0)
MCV: 87.3 fl (ref 78.0–100.0)
Monocytes Absolute: 0.5 10*3/uL (ref 0.1–1.0)
Monocytes Relative: 7.1 % (ref 3.0–12.0)
Neutro Abs: 4.1 10*3/uL (ref 1.4–7.7)
Platelets: 303 10*3/uL (ref 150.0–400.0)
RDW: 14 % (ref 11.5–14.6)

## 2012-08-15 LAB — HEPATIC FUNCTION PANEL
AST: 25 U/L (ref 0–37)
Albumin: 3.8 g/dL (ref 3.5–5.2)
Alkaline Phosphatase: 65 U/L (ref 39–117)
Total Bilirubin: 0.6 mg/dL (ref 0.3–1.2)

## 2012-08-15 LAB — TSH: TSH: 1.6 u[IU]/mL (ref 0.35–5.50)

## 2012-08-15 LAB — LIPID PANEL
HDL: 54.2 mg/dL (ref >39.00)
Total CHOL/HDL Ratio: 4
Triglycerides: 103 mg/dL (ref 0.0–149.0)
VLDL: 20.6 mg/dL (ref 0.0–40.0)

## 2012-08-15 LAB — BASIC METABOLIC PANEL
Calcium: 9.9 mg/dL (ref 8.4–10.5)
GFR: 62.72 mL/min (ref >60.00)
Potassium: 4.4 mEq/L (ref 3.5–5.1)
Sodium: 137 mEq/L (ref 135–145)
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
## 2012-08-15 NOTE — Patient Instructions (Addendum)
Today we updated your med list in our EPIC system...    Continue your current medications the same...    We refilled your meds per request...  Today we did your follow up CXR & FASTING blood work...    We will contact you w/ the results when avail...  Keep up the good work w/ your diet, exercise program and smoking cessation plans...  Let me know if we can be of assistance in any way...  Let's continue our yearly check ups, but call sooner if needed for any problems.Marland KitchenMarland Kitchen

## 2012-08-20 ENCOUNTER — Other Ambulatory Visit: Payer: Self-pay | Admitting: Pulmonary Disease

## 2012-08-20 ENCOUNTER — Telehealth: Payer: Self-pay | Admitting: Pulmonary Disease

## 2012-08-20 MED ORDER — LEVOTHYROXINE SODIUM 75 MCG PO CAPS: 75.0000 ug | ORAL_CAPSULE | Freq: Every day | ORAL | Status: DC

## 2012-08-20 NOTE — Telephone Encounter (Signed)
Rx has been CALLED in, this was sent in to [facility] on 08/16/12 but they are telling the pt we never sent it.   I advised the pt that I would get this taken care for her.

## 2012-08-22 ENCOUNTER — Telehealth: Payer: Self-pay | Admitting: Pulmonary Disease

## 2012-08-22 NOTE — Telephone Encounter (Signed)
Spoke with patient informed her of results/recs as listed below per Dr. Kriste Basque. Pt verbalized understanding and nothing furthering needed at this time.   Notes Recorded by Michele Mcalpine, MD on 08/16/2012 at 8:28 AM Please notify patient>  CXR is clear, WNL.[name] Kitchenrecommended- must quit all smoking!  Notes Recorded by Michele Mcalpine, MD on 08/16/2012 at 8:27 AM Please notify patient>  FLP unchanged, not at goals on diet alone, she's been INTOL to all meds, Rec- continue lipid clinic efforts... Chems. CBC, Thyroid> all WNL.[name] KitchenMarland [name]

## 2012-08-22 NOTE — Progress Notes (Signed)
Quick Note:  Spoke with patient informed her of results/recs as listed below per Dr. Nadel. Pt verbalized understanding and nothing furthering needed at this time. ______ 

## 2012-08-22 NOTE — Progress Notes (Signed)
Quick Note:  Spoke with patient informed her of results/recs as listed below per Dr. Kriste Basque. Pt verbalized understanding and nothing furthering needed at this time. ______

## 2012-09-10 ENCOUNTER — Encounter: Payer: Self-pay | Admitting: Pharmacist

## 2012-09-24 ENCOUNTER — Ambulatory Visit: Payer: Medicare HMO | Admitting: Pharmacist

## 2012-09-30 ENCOUNTER — Ambulatory Visit (INDEPENDENT_AMBULATORY_CARE_PROVIDER_SITE_OTHER): Payer: Medicare HMO | Admitting: Pharmacist

## 2012-09-30 VITALS — Ht 67.0 in | Wt 160.8 lb

## 2012-09-30 DIAGNOSIS — E7800 Pure hypercholesterolemia, unspecified: Secondary | ICD-10-CM

## 2012-09-30 NOTE — Progress Notes (Signed)
Subjective- [name] [name] presented today in for 6 month follow-up of her lipids. She has a notable history of intolerance to multiple statins and zetia. Currently, she has only been taking krill oil. She continues to smoke ~1/2-3/4 ppd and knows she needs to quit, but still isn't completely ready yet and has a hx of rash from nicotine patches.    Diet-  She continues to try to maintain a heart healthy diet full of fruits & vegetables and avoids fried foods but says she does frequently cheat with sweats and red meat   Exercise-  She has not been using her exercise equipment recently, says the sciatic pain isn't what's limiting her at this time just hasn't felt like it.  Current Outpatient Prescriptions  Medication Sig Dispense Refill  . aspirin EC 81 MG EC tablet Take 81 mg by mouth daily.        . Cholecalciferol (VITAMIN D) 1000 UNITS capsule Take 1,000 Units by mouth daily.       Marland Kitchen estradiol (ESTRACE) 0.1 MG/GM vaginal cream Place 2 g vaginally 2 (two) times a week.       Marland Kitchen KRILL OIL 1000 MG CAPS Take 2,000 mg by mouth daily.      . Levothyroxine Sodium 75 MCG CAPS Take 1 capsule (75 mcg total) by mouth daily.  90 capsule  3  . thiamine (VITAMIN B-1) 100 MG tablet Take 200 mg by mouth daily.       No current facility-administered medications for this visit.

## 2012-09-30 NOTE — Assessment & Plan Note (Addendum)
Pt's cholesterol similar to last visit. TC- 240 (goal<200), TG- 103 (goal<150), HDL- 54 (goal>45), and LDL- 169 (goal<130). LFTs are WNL. Pt would still prefer natural treatment rather than prescription medications. She is going to continue [employer] and try an unknown Newton's brand cholesterol medication. Spoke to her about focusing on her diet and improving her exercise regimen now that it's warmed up she plans on going on walks. We will follow up in 6 months. Patient seen with Weston Brass, PharmD, CPP,  [place] Stager PharmD resident, and [employer] PharmD student.

## 2012-09-30 NOTE — Patient Instructions (Signed)
1. Now that the weather is nicer focus on increasing your exercise. 2. Continue to focus on improving your diet 3. We will see you in 6 months for labs and follow up

## 2012-10-15 ENCOUNTER — Other Ambulatory Visit: Payer: Self-pay | Admitting: Pulmonary Disease

## 2012-10-15 DIAGNOSIS — Z1231 Encounter for screening mammogram for malignant neoplasm of breast: Secondary | ICD-10-CM

## 2012-11-21 ENCOUNTER — Other Ambulatory Visit: Payer: Self-pay | Admitting: Neurosurgery

## 2012-11-21 DIAGNOSIS — M545 Low back pain, unspecified: Secondary | ICD-10-CM

## 2012-11-29 ENCOUNTER — Ambulatory Visit
Admission: RE | Admit: 2012-11-29 | Discharge: 2012-11-29 | Disposition: A | Discharge status: Home / Self Care | Payer: Medicare HMO | Source: Ambulatory Visit | Attending: Neurosurgery | Admitting: Neurosurgery

## 2012-11-29 DIAGNOSIS — M545 Low back pain, unspecified: Secondary | ICD-10-CM

## 2012-11-29 MED ORDER — GADOBENATE DIMEGLUMINE 529 MG/ML IV SOLN
13.0000 mL | Freq: Once | INTRAVENOUS | Status: AC | PRN
  Administered 2012-11-29: 13 mL via INTRAVENOUS

## 2013-03-31 ENCOUNTER — Other Ambulatory Visit: Payer: Medicare HMO

## 2013-03-31 DIAGNOSIS — E7800 Pure hypercholesterolemia, unspecified: Secondary | ICD-10-CM

## 2013-04-01 ENCOUNTER — Ambulatory Visit: Payer: Medicare HMO | Admitting: Pharmacist

## 2013-04-10 ENCOUNTER — Ambulatory Visit (INDEPENDENT_AMBULATORY_CARE_PROVIDER_SITE_OTHER): Payer: Medicare HMO | Admitting: *Deleted

## 2013-04-10 ENCOUNTER — Ambulatory Visit (INDEPENDENT_AMBULATORY_CARE_PROVIDER_SITE_OTHER): Payer: Medicare HMO | Admitting: Pharmacist

## 2013-04-10 DIAGNOSIS — E785 Hyperlipidemia, unspecified: Secondary | ICD-10-CM

## 2013-04-10 DIAGNOSIS — E7800 Pure hypercholesterolemia, unspecified: Secondary | ICD-10-CM

## 2013-04-10 DIAGNOSIS — F17200 Nicotine dependence, unspecified, uncomplicated: Secondary | ICD-10-CM

## 2013-04-10 DIAGNOSIS — E782 Mixed hyperlipidemia: Secondary | ICD-10-CM

## 2013-04-10 LAB — LIPID PANEL
HDL: 54.6 mg/dL (ref >39.00)
Total CHOL/HDL Ratio: 4
Triglycerides: 172 mg/dL — ABNORMAL HIGH (ref 0.0–149.0)

## 2013-04-10 LAB — LDL CHOLESTEROL, DIRECT: Direct LDL: 140.3 mg/dL
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
## 2013-04-10 NOTE — Assessment & Plan Note (Signed)
Discussed smoking cessation options with pt.  She is not interested in any of the other nicotine replacement options such as gum or lozenges due to taste.  She has tried Chantix in the past but this causes unusual dreams.  Discussed bupropion with patient.  She is willing to try this.  Will discuss with Dr. Kriste Basque.

## 2013-04-11 ENCOUNTER — Telehealth: Payer: Self-pay | Admitting: Pharmacist

## 2013-04-11 MED ORDER — BUPROPION HCL ER (SR) 150 MG PO TB12: ORAL_TABLET | ORAL | Status: DC

## 2013-04-11 NOTE — Telephone Encounter (Signed)
Spoke with pt.  She is aware of her lab results

## 2013-04-11 NOTE — Telephone Encounter (Signed)
New message   Kennon Rounds in the lipid clinic was supposed to call pt and give her the lab results.

## 2013-05-06 ENCOUNTER — Telehealth: Payer: Self-pay | Admitting: Pulmonary Disease

## 2013-05-06 NOTE — Telephone Encounter (Signed)
Called pt re: her question about her Vit D supplements.  Her med list states that she is taking 1000u daily.  The pt states that she bought a Vit D supplement with other herbs in it that has 1000u in each pill, and is taking 4 poqd.  She also states that she takes a 500u regular Vit D supplement.  She is concerned that this might be too much Vit D.  I told the pt that I would be in contact with Dr. Kriste Basque for his take on this, and will call her back when I hear orders from [facility]. [place],Ashley L

## 2013-05-07 NOTE — Telephone Encounter (Signed)
Per SN---  No this is not too much.  Cont the same and we will follow.  thanks

## 2013-05-07 NOTE — Telephone Encounter (Signed)
I spoke with pt and is aware of recs. Nothing further needed 

## 2013-08-13 ENCOUNTER — Other Ambulatory Visit: Payer: Self-pay | Admitting: Pulmonary Disease

## 2013-10-20 ENCOUNTER — Telehealth: Payer: Self-pay | Admitting: Pulmonary Disease

## 2013-10-20 NOTE — Telephone Encounter (Signed)
Pt returning call.[name] [name] ° °

## 2013-10-20 NOTE — Telephone Encounter (Signed)
Called spoke with pt. She reports she is seen at the [place] [facility] on [address]. She reports when she was here last year to see SN he advised her he knew someone better he could refer her to since she has had several problems with [place] [facility]. She is scheduled for another u/s Wednesday but wants to cancel since her last experience did not go well. She wants to know what Sn recommends. Please advise thanks

## 2013-10-20 NOTE — Telephone Encounter (Signed)
lmtcb x1 w/ spouse 

## 2013-10-20 NOTE — Telephone Encounter (Signed)
Per SN---  rec vein eval by Dr. Donnetta Hutching at [facility]   Called and lmomtcb for pt to make her aware.

## 2013-10-20 NOTE — Telephone Encounter (Signed)
Pt aware. Pt is awaiting a phone call for appt. Nothing further needed at this time

## 2013-11-04 ENCOUNTER — Telehealth: Payer: Self-pay | Admitting: Pulmonary Disease

## 2013-11-04 ENCOUNTER — Other Ambulatory Visit: Payer: Self-pay | Admitting: Pulmonary Disease

## 2013-11-04 DIAGNOSIS — I8390 Asymptomatic varicose veins of unspecified lower extremity: Secondary | ICD-10-CM

## 2013-11-04 NOTE — Telephone Encounter (Signed)
Spoke with the pt  She states waiting for referral to Dr Donnetta Hutching  SN ordered this per last PN but referral never sent  I have sent order to [facility]  Pt states nothing further needed

## 2013-11-11 ENCOUNTER — Other Ambulatory Visit: Payer: Self-pay | Admitting: *Deleted

## 2013-11-11 DIAGNOSIS — I83893 Varicose veins of bilateral lower extremities with other complications: Secondary | ICD-10-CM

## 2013-12-01 ENCOUNTER — Encounter: Payer: Self-pay | Admitting: Vascular Surgery

## 2013-12-02 ENCOUNTER — Ambulatory Visit (HOSPITAL_COMMUNITY)
Admission: RE | Admit: 2013-12-02 | Discharge: 2013-12-02 | Disposition: A | Discharge status: Home / Self Care | Payer: Medicare HMO | Source: Ambulatory Visit | Attending: Vascular Surgery | Admitting: Vascular Surgery

## 2013-12-02 ENCOUNTER — Ambulatory Visit (INDEPENDENT_AMBULATORY_CARE_PROVIDER_SITE_OTHER): Payer: Medicare HMO | Admitting: Vascular Surgery

## 2013-12-02 ENCOUNTER — Telehealth: Payer: Self-pay | Admitting: Pulmonary Disease

## 2013-12-02 ENCOUNTER — Encounter: Payer: Self-pay | Admitting: Vascular Surgery

## 2013-12-02 VITALS — BP 141/77 | HR 86 | Resp 18 | Ht 66.0 in | Wt 162.3 lb

## 2013-12-02 DIAGNOSIS — M79676 Pain in unspecified toe(s): Secondary | ICD-10-CM

## 2013-12-02 DIAGNOSIS — I83893 Varicose veins of bilateral lower extremities with other complications: Secondary | ICD-10-CM | POA: Insufficient documentation

## 2013-12-02 DIAGNOSIS — I8390 Asymptomatic varicose veins of unspecified lower extremity: Secondary | ICD-10-CM | POA: Insufficient documentation

## 2013-12-02 NOTE — Telephone Encounter (Signed)
Called spoke with pt. Aware of recs. Referral placed. Nothing further needed

## 2013-12-02 NOTE — Progress Notes (Signed)
Patient name: [name] [name] MRN: [id number] DOB: September 02, 1940 Sex: female   Referred by: Lenna Gilford  Reason for referral:  Chief Complaint  Patient presents with  . New Evaluation    c/o leg pain L>R   treated at [facility]    . Varicose Veins    HISTORY OF PRESENT ILLNESS: The patient presents today for evaluation of lower extremity venous pathology. She is a very pleasant 73 year old female with a greater than 50 year history of venous pathology. She reports that she underwent bilateral great saphenous vein stripping by Dr. Gordy Savers when she was in her early 42s. She's had some long-term result with this. Over the past several years she has had treatment at an outlying vein center in have a multiple records were reviewed from this treatment. She had multiple sclerotherapy treatments for what sounds like telangiectasia and reticular veins with no large varicosities. She had some complications with the ulceration of her skin with these treatments. She's also been sitting this vein center for yearly ultrasound since that time. She suggested a for further discussion. She has no history of DVT.  Past Medical History  Diagnosis Date  . Cigarette smoker   . COPD (chronic obstructive pulmonary disease)   . Asymptomatic varicose veins   . Pure hypercholesterolemia   . Diverticulosis of colon (without mention of hemorrhage)   . Lumbar back pain   . Headache(784.0)   . Hearing loss   . Stress disorder, acute   . Varicose veins     Past Surgical History  Procedure Laterality Date  . Varicose vein treatment      at [facility] center  . Lumbar spine surgery      History   Social History  . Marital Status: Married    Spouse Name: N/A    Number of Children: 2  . Years of Education: N/A   Occupational History  . retired    Social History Main Topics  . Smoking status: Current Every Day Smoker -- 0.80 packs/day    Types: Cigarettes  . Smokeless tobacco: Not on file  .  Alcohol Use: Yes  . Drug Use: No  . Sexual Activity: Not on file   Other Topics Concern  . Not on file   Social History Narrative  . No narrative on file    History reviewed. No pertinent family history.  Allergies as of 12/02/2013 - Review Complete 12/02/2013  Allergen Reaction Noted  . Atorvastatin    . Ezetimibe    . Rosuvastatin    . Nicoderm [nicotine] Rash 08/15/2012    Current Outpatient Prescriptions on File Prior to Visit  Medication Sig Dispense Refill  . aspirin EC 81 MG EC tablet Take 81 mg by mouth daily.        . Cholecalciferol (VITAMIN D) 1000 UNITS capsule Take 1,000 Units by mouth daily.       Marland Kitchen estradiol (ESTRACE) 0.1 MG/GM vaginal cream Place 2 g vaginally 2 (two) times a week.       Marland Kitchen KRILL OIL 1000 MG CAPS Take 2,000 mg by mouth daily.      Marland Kitchen levothyroxine (SYNTHROID, LEVOTHROID) 75 MCG tablet TAKE ONE TABLET BY MOUTH ONCE DAILY  90 tablet  0  . buPROPion (WELLBUTRIN SR) 150 MG 12 hr tablet Take 1 tablet by mouth daily for the first 3 days then increase to 150mg  twice daily  60 tablet  0  . Levothyroxine Sodium 75 MCG CAPS Take 1 capsule (75  mcg total) by mouth daily.  90 capsule  3  . thiamine (VITAMIN B-1) 100 MG tablet Take 200 mg by mouth daily.       No current facility-administered medications on file prior to visit.     REVIEW OF SYSTEMS:  Positives indicated with an "X"  CARDIOVASCULAR:  [ ]  chest pain   [ ]  chest pressure   [ ]  palpitations   [ ]  orthopnea   [ ]  dyspnea on exertion   [ ]  claudication   [ ]  rest pain   [x ] DVT   [ ]  phlebitis PULMONARY:   [ ]  productive cough   [ ]  asthma   [ ]  wheezing NEUROLOGIC:   [ ]  weakness  [ ]  paresthesias  [ ]  aphasia  [ ]  amaurosis  [ ]  dizziness HEMATOLOGIC:   [ ]  bleeding problems   [ ]  clotting disorders MUSCULOSKELETAL:  [ ]  joint pain   [ ]  joint swelling GASTROINTESTINAL: [ ]   blood in stool  [ ]   hematemesis GENITOURINARY:  [ ]   dysuria  [ ]   hematuria PSYCHIATRIC:  [ ]  history of major  depression INTEGUMENTARY:  [ ]  rashes  [ ]  ulcers CONSTITUTIONAL:  [ ]  fever   [ ]  chills  PHYSICAL EXAMINATION:  General: The patient is a well-nourished female, in no acute distress. Vital signs are BP 141/77  Pulse 86  Resp 18  Ht 5\' 6"  (1.676 m)  Wt 162 lb 4.8 oz (73.619 kg)  BMI 26.21 kg/m2 Pulmonary: There is a good air exchange  Musculoskeletal: There are no major deformities.  There is no significant extremity pain. Neurologic: No focal weakness or paresthesias are detected, Skin: There are no ulcer or rashes noted. Psychiatric: The patient has normal affect. Cardiovascular: 2+ dorsalis pedis pulses bilaterally She is no evidence of venous varicosities or reticular veins. She does have scattered telangiectasia bilaterally. She does have old surgical scars from stab phlebectomy of multiple varicosities   [facility] Vascular Lab Studies:  Ordered and Independently Reviewed lateral lower extremity venous duplex show absence of her great saphenous vein bilaterally. There is no evidence of incompetence in her small saphenous vein bilaterally she does have some valvular incompetence in her femoral vein on the right  Impression and Plan:  Long history of venous pathology with no evidence of current major difficulties. She does not have any swelling in has scattered telangiectasia which are cosmetic concern only. I would not recommend yearly duplex and certainly would make no recommendation for treatment based on this alone. She understands the potential for progression of venous pathology will notify should she have any new symptoms. Otherwise she was reassured will see [place] again on an as-needed basis    EARLY, TODD [facility] Office: [phone]

## 2013-12-02 NOTE — Telephone Encounter (Signed)
Per SN---  Needs to be seen by Dr. Sharol Given or someone in this group ASAP for eval of her toe.  thanks

## 2013-12-02 NOTE — Telephone Encounter (Signed)
Pt states that she hurt her toe this weekend-- dropped a can of veggies on her her Left great toe Pt states that the toenail is barely hanging on Toes is bruised--very painful Pt concerned of infection and what to do about the loose nail Leaving to go out of town Monday 12/08/13  Pt wanting some recs as to what can be done. Referral to see specialist or appt with our office.  Please advise Dr Lenna Gilford. Thanks.

## 2014-02-06 ENCOUNTER — Other Ambulatory Visit: Payer: Self-pay | Admitting: Pulmonary Disease

## 2014-02-09 ENCOUNTER — Telehealth: Payer: Self-pay | Admitting: Pulmonary Disease

## 2014-02-09 MED ORDER — LEVOTHYROXINE SODIUM 75 MCG PO TABS: ORAL_TABLET | ORAL | Status: DC

## 2014-02-09 NOTE — Telephone Encounter (Signed)
Pt calling requesting refill of Levothyroxine 48mcg #90 Sent to [facility] made follow-up visit for yearly exam 02/17/2014 at 930  Please advise [name] [name] is this appt is okay to keep or if patient needs to establish with new PCP? Pt not aware that SN not practicing in Primary Care any longer.

## 2014-02-09 NOTE — Telephone Encounter (Signed)
[place] for the pt to keep this appt.

## 2014-02-17 ENCOUNTER — Ambulatory Visit (INDEPENDENT_AMBULATORY_CARE_PROVIDER_SITE_OTHER): Payer: Medicare HMO | Admitting: Pulmonary Disease

## 2014-02-17 ENCOUNTER — Encounter: Payer: Self-pay | Admitting: Pulmonary Disease

## 2014-02-17 ENCOUNTER — Ambulatory Visit (INDEPENDENT_AMBULATORY_CARE_PROVIDER_SITE_OTHER)
Admission: RE | Admit: 2014-02-17 | Discharge: 2014-02-17 | Disposition: A | Discharge status: Home / Self Care | Payer: Medicare HMO | Source: Ambulatory Visit | Attending: Pulmonary Disease | Admitting: Pulmonary Disease

## 2014-02-17 ENCOUNTER — Other Ambulatory Visit (INDEPENDENT_AMBULATORY_CARE_PROVIDER_SITE_OTHER): Payer: Medicare HMO

## 2014-02-17 ENCOUNTER — Encounter (INDEPENDENT_AMBULATORY_CARE_PROVIDER_SITE_OTHER): Payer: Self-pay

## 2014-02-17 VITALS — BP 138/60 | HR 74 | Temp 97.6°F | Ht 65.75 in | Wt 160.4 lb

## 2014-02-17 DIAGNOSIS — E782 Mixed hyperlipidemia: Secondary | ICD-10-CM

## 2014-02-17 DIAGNOSIS — E039 Hypothyroidism, unspecified: Secondary | ICD-10-CM

## 2014-02-17 DIAGNOSIS — M545 Low back pain, unspecified: Secondary | ICD-10-CM

## 2014-02-17 DIAGNOSIS — J449 Chronic obstructive pulmonary disease, unspecified: Secondary | ICD-10-CM

## 2014-02-17 DIAGNOSIS — K5730 Diverticulosis of large intestine without perforation or abscess without bleeding: Secondary | ICD-10-CM

## 2014-02-17 DIAGNOSIS — R0989 Other specified symptoms and signs involving the circulatory and respiratory systems: Secondary | ICD-10-CM

## 2014-02-17 DIAGNOSIS — M159 Polyosteoarthritis, unspecified: Secondary | ICD-10-CM

## 2014-02-17 DIAGNOSIS — M150 Primary generalized (osteo)arthritis: Secondary | ICD-10-CM

## 2014-02-17 DIAGNOSIS — R51 Headache: Secondary | ICD-10-CM

## 2014-02-17 DIAGNOSIS — Z23 Encounter for immunization: Secondary | ICD-10-CM

## 2014-02-17 DIAGNOSIS — F17200 Nicotine dependence, unspecified, uncomplicated: Secondary | ICD-10-CM

## 2014-02-17 LAB — CBC WITH DIFFERENTIAL/PLATELET
Basophils Absolute: 0.1 10*3/uL (ref 0.0–0.1)
Basophils Relative: 0.6 % (ref 0.0–3.0)
EOS ABS: 0.1 10*3/uL (ref 0.0–0.7)
Eosinophils Relative: 1.6 % (ref 0.0–5.0)
HCT: 42 % (ref 36.0–46.0)
Hemoglobin: 14.1 g/dL (ref 12.0–15.0)
LYMPHS PCT: 28.9 % (ref 12.0–46.0)
Lymphs Abs: 2.3 10*3/uL (ref 0.7–4.0)
MCHC: 33.6 g/dL (ref 30.0–36.0)
MCV: 88.4 fl (ref 78.0–100.0)
MONO ABS: 0.6 10*3/uL (ref 0.1–1.0)
Monocytes Relative: 7.3 % (ref 3.0–12.0)
NEUTROS PCT: 61.6 % (ref 43.0–77.0)
Neutro Abs: 5 10*3/uL (ref 1.4–7.7)
Platelets: 316 10*3/uL (ref 150.0–400.0)
RBC: 4.75 Mil/uL (ref 3.87–5.11)
RDW: 14.3 % (ref 11.5–15.5)
WBC: 8.1 10*3/uL (ref 4.0–10.5)

## 2014-02-17 LAB — LIPID PANEL
CHOL/HDL RATIO: 5
CHOLESTEROL: 243 mg/dL — AB (ref 0–200)
HDL: 48.7 mg/dL (ref >39.00)
LDL Cholesterol: 163 mg/dL — ABNORMAL HIGH (ref 0–99)
NonHDL: 194.3
TRIGLYCERIDES: 159 mg/dL — AB (ref 0.0–149.0)
VLDL: 31.8 mg/dL (ref 0.0–40.0)

## 2014-02-17 LAB — BASIC METABOLIC PANEL
BUN: 14 mg/dL (ref 6–23)
CHLORIDE: 103 meq/L (ref 96–112)
CO2: 28 meq/L (ref 19–32)
Calcium: 9.6 mg/dL (ref 8.4–10.5)
Creatinine, Ser: 0.8 mg/dL (ref 0.4–1.2)
GFR: 72.23 mL/min (ref >60.00)
GLUCOSE: 96 mg/dL (ref 70–99)
Potassium: 3.9 mEq/L (ref 3.5–5.1)
Sodium: 138 mEq/L (ref 135–145)

## 2014-02-17 LAB — HEPATIC FUNCTION PANEL
ALT: 17 U/L (ref 0–35)
AST: 18 U/L (ref 0–37)
Albumin: 3.9 g/dL (ref 3.5–5.2)
Alkaline Phosphatase: 64 U/L (ref 39–117)
BILIRUBIN TOTAL: 0.7 mg/dL (ref 0.2–1.2)
Bilirubin, Direct: 0.1 mg/dL (ref 0.0–0.3)
TOTAL PROTEIN: 7.2 g/dL (ref 6.0–8.3)

## 2014-02-17 LAB — TSH: TSH: 1.26 u[IU]/mL (ref 0.35–4.50)
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
## 2014-02-17 NOTE — Patient Instructions (Signed)
Today we updated your med list in our EPIC system...    Continue your current medications the same...    You need to stop smoking completely!!!  Try the nicotine gum, lozenges, patches, etc...  Today we did your follow up FASTING blood work & CXR... We will arrange for a blood flow test of your carotid arteries...    We will contact you w/ the results when available...   We gave you the Prevnar-13 pneumonia vaccine today...    Be sure to get the 2015 Flu vaccine when avail...  Ok to use the Aleve, Tylenol, Nyquil as needed...  Call for any questions or if we can be of service in any way.Marland KitchenMarland Kitchen

## 2014-02-18 ENCOUNTER — Other Ambulatory Visit: Payer: Self-pay | Admitting: Pulmonary Disease

## 2014-02-18 DIAGNOSIS — K5730 Diverticulosis of large intestine without perforation or abscess without bleeding: Secondary | ICD-10-CM

## 2014-02-20 ENCOUNTER — Ambulatory Visit (HOSPITAL_COMMUNITY): Payer: Medicare HMO | Attending: Cardiology | Admitting: Radiology

## 2014-02-20 DIAGNOSIS — Z8673 Personal history of transient ischemic attack (TIA), and cerebral infarction without residual deficits: Secondary | ICD-10-CM | POA: Insufficient documentation

## 2014-02-20 DIAGNOSIS — I658 Occlusion and stenosis of other precerebral arteries: Secondary | ICD-10-CM | POA: Diagnosis not present

## 2014-02-20 DIAGNOSIS — E785 Hyperlipidemia, unspecified: Secondary | ICD-10-CM | POA: Diagnosis not present

## 2014-02-20 DIAGNOSIS — R0989 Other specified symptoms and signs involving the circulatory and respiratory systems: Secondary | ICD-10-CM

## 2014-02-20 DIAGNOSIS — I6529 Occlusion and stenosis of unspecified carotid artery: Secondary | ICD-10-CM | POA: Insufficient documentation

## 2014-02-20 DIAGNOSIS — F17200 Nicotine dependence, unspecified, uncomplicated: Secondary | ICD-10-CM | POA: Diagnosis not present

## 2014-02-20 NOTE — Progress Notes (Signed)
Carotid Duplex performed.

## 2014-02-25 ENCOUNTER — Encounter: Payer: Self-pay | Admitting: Internal Medicine

## 2014-03-27 ENCOUNTER — Encounter: Payer: Self-pay | Admitting: Pulmonary Disease

## 2014-05-02 ENCOUNTER — Other Ambulatory Visit: Payer: Self-pay | Admitting: Pulmonary Disease

## 2014-06-24 ENCOUNTER — Ambulatory Visit (INDEPENDENT_AMBULATORY_CARE_PROVIDER_SITE_OTHER): Payer: Medicare HMO | Admitting: Family Medicine

## 2014-06-24 ENCOUNTER — Encounter: Payer: Self-pay | Admitting: Internal Medicine

## 2014-06-24 ENCOUNTER — Encounter: Payer: Self-pay | Admitting: Family Medicine

## 2014-06-24 VITALS — BP 160/100 | HR 77 | Temp 97.6°F | Wt 151.0 lb

## 2014-06-24 DIAGNOSIS — F17200 Nicotine dependence, unspecified, uncomplicated: Secondary | ICD-10-CM

## 2014-06-24 DIAGNOSIS — E7800 Pure hypercholesterolemia, unspecified: Secondary | ICD-10-CM

## 2014-06-24 DIAGNOSIS — E039 Hypothyroidism, unspecified: Secondary | ICD-10-CM

## 2014-06-24 DIAGNOSIS — IMO0001 Reserved for inherently not codable concepts without codable children: Secondary | ICD-10-CM

## 2014-06-24 DIAGNOSIS — F17210 Nicotine dependence, cigarettes, uncomplicated: Secondary | ICD-10-CM

## 2014-06-24 DIAGNOSIS — E034 Atrophy of thyroid (acquired): Secondary | ICD-10-CM

## 2014-06-24 DIAGNOSIS — Z1211 Encounter for screening for malignant neoplasm of colon: Secondary | ICD-10-CM

## 2014-06-24 DIAGNOSIS — R030 Elevated blood-pressure reading, without diagnosis of hypertension: Secondary | ICD-10-CM

## 2014-06-24 NOTE — Assessment & Plan Note (Signed)
Controlled based off 9/15 a1c. Continue levothyroxine 22mcg

## 2014-06-24 NOTE — Patient Instructions (Addendum)
Try this combination for sleep  Tylenol/acetaminophen 650mg  at bedtime  Benadryl/diphenhydramine 12.5mg  at bedtime  Although I don't think the nyquil would be bad for you-bring the bottle next you come.   Send you for colonoscopy. They will call you.   I am thrilled you want to quit smoking.

## 2014-06-24 NOTE — Progress Notes (Signed)
[name], MD Phone: [phone]  Subjective:  Patient presents today to establish care with me as their new primary care provider. Patient was formerly a patient of Dr. Lenna Gilford. Chief complaint-noted.   Elevated blood pressure-poor control  BP Readings from Last 3 Encounters:  06/24/14 160/100  02/17/14 138/60  12/02/13 141/77  previously well controlled. Patient admits to anxiety in meeting new doctor. Also Dental pain up to 800mg  ibuprofen per dentist.  Home BP monitoring-no Compliant with medications-yes without side effects ROS-Denies any CP, HA, SOB, blurry vision, LE edema  Hyperlipidemia-poor control  Lab Results  Component Value Date   LDLCALC 163* 02/17/2014  On statin: no, krill oil only Statin intolerant ROS- no chest pain or shortness of breath. Sore all over. Sleep # bed helped. No stiffness.   Hypothyroidism-controlled On thyroid medication-levothyorxine 32mcg ROS-No hair or nail changes. No heat/cold intolerance. No constipation or diarrhea.   Tobacco abuse- poor control Patient interested in quitting. 1/2 PPD. Wants to use patches-rash in the past on a 14mg  patch it sounds like ROS- can go up flight of stairs without shortness of breath, denies chest pain or dyspnea on exertion   The following were reviewed and entered/updated in epic: Past Medical History  Diagnosis Date  . Cigarette smoker   . COPD (chronic obstructive pulmonary disease)   . Asymptomatic varicose veins   . Pure hypercholesterolemia   . Diverticulosis of colon (without mention of hemorrhage)   . Lumbar back pain   . Headache(784.0)   . Hearing loss   . Stress disorder, acute   . Varicose veins    Patient Active Problem List   Diagnosis Date Noted  . CIGARETTE SMOKER 10/09/2007    Priority: High  . Hypothyroidism 10/12/2007    Priority: Medium  . HYPERCHOLESTEROLEMIA 10/08/2007    Priority: Medium  . COPD (chronic obstructive pulmonary disease) 10/08/2007    Priority:  Medium  . Varicose veins 12/02/2013    Priority: Low  . OA (osteoarthritis) 08/15/2012    Priority: Low  . Hearing loss 08/16/2009    Priority: Low  . BACK PAIN, LUMBAR 10/08/2007    Priority: Low   Past Surgical History  Procedure Laterality Date  . Varicose vein treatment      at [facility] vein [facility]  . Lumbar spine surgery      Family History  Problem Relation Age of Onset  . Diabetes Father   . Heart attack Mother 61    nonsmoker    Medications- reviewed and updated Current Outpatient Prescriptions  Medication Sig Dispense Refill  . aspirin EC 81 MG EC tablet Take 81 mg by mouth daily.      . Cholecalciferol (VITAMIN D) 1000 UNITS capsule Take 1,000 Units by mouth daily.     Marland Kitchen estradiol (ESTRACE) 0.1 MG/GM vaginal cream Place 2 g vaginally 2 (two) times a week.     Marland Kitchen KRILL OIL 1000 MG CAPS Take 2,000 mg by mouth 2 (two) times daily.     Marland Kitchen levothyroxine (SYNTHROID, LEVOTHROID) 75 MCG tablet TAKE ONE TABLET BY MOUTH ONCE DAILY 90 tablet 0   Allergies-reviewed and updated Allergies  Allergen Reactions  . Atorvastatin     Intolerant to all statins   . Ezetimibe      INTOL to Zetia  . Rosuvastatin     intolerant to all statins  . Nicoderm [Nicotine] Rash    Unable to use the patches---caused severe rash to area placed    History   Social History  .  Marital Status: Married    Spouse Name: N/A    Number of Children: 2  . Years of Education: N/A   Occupational History  . retired    Social History Main Topics  . Smoking status: Current Every Day Smoker -- 0.80 packs/day    Types: Cigarettes  . Smokeless tobacco: None  . Alcohol Use: No  . Drug Use: No  . Sexual Activity: None   Other Topics Concern  . None   Social History Narrative   Married 2008 (3rd marriage). 2 sons by first husband. 3 grandkids.       Retired [employer] for 40 years      Hobbies: Designer, multimedia games, go to casino [place]    ROS--See HPI   Objective: BP  160/100 mmHg  Pulse 77  Temp(Src) 97.6 F (36.4 C)  Wt 151 lb (68.493 kg) Gen: NAD, resting comfortably HEENT: Mucous membranes are moist. Oropharynx normal CV: RRR no murmurs rubs or gallops Lungs: CTAB no crackles, wheeze, rhonchi, prolonged expiratory phase Abdomen: soft/nontender/nondistended/normal bowel sounds. No rebound or guarding.  Ext: no edema Skin: warm, dry, no rash   Assessment/Plan:  Elevated blood pressure-poor control  Discussed using ibuprofen sparingly as needed for dental pain. She has been controlled previously so we will keep a close eye and repeat at follow up.   CIGARETTE SMOKER Going to try patches again in January. 1/2 PPD. Encouraged patient to quit. 1800quit now advised. 8 minutes spent in counseling.   HYPERCHOLESTEROLEMIA Statin intolerant. On krill oil alone. Discussed increased cardiac/cva risk and how must minimize this by quitting smoking. Continue aspirin.   Hypothyroidism Controlled based off 9/15 a1c. Continue levothyroxine 5mcg  Return precautions advised. 6 month follow up planned unless patient wants to see me to talk about smoking cessation.   Some changes in bowel habits with more constipation and no colonoscopy on file so  Orders Placed This Encounter  Procedures  . Ambulatory referral to Gastroenterology    Referral Priority:  Routine    Referral Type:  Consultation    Referral Reason:  Specialty Services Required    Requested Specialty:  Gastroenterology    Number of Visits Requested:  1

## 2014-06-24 NOTE — Assessment & Plan Note (Signed)
Statin intolerant. On krill oil alone. Discussed increased cardiac/cva risk and how must minimize this by quitting smoking. Continue aspirin.

## 2014-06-24 NOTE — Assessment & Plan Note (Addendum)
Going to try patches again in January. 1/2 PPD. Encouraged patient to quit. 1800quit now advised. 8 minutes spent in counseling.

## 2014-06-25 ENCOUNTER — Encounter: Payer: Self-pay | Admitting: Internal Medicine

## 2014-07-27 ENCOUNTER — Telehealth: Payer: Self-pay

## 2014-07-27 ENCOUNTER — Other Ambulatory Visit: Payer: Self-pay | Admitting: Pulmonary Disease

## 2014-07-27 MED ORDER — LEVOTHYROXINE SODIUM 75 MCG PO TABS: 75.0000 ug | ORAL_TABLET | Freq: Every day | ORAL | Status: DC

## 2014-07-27 NOTE — Telephone Encounter (Signed)
Medication refill

## 2014-07-27 NOTE — Telephone Encounter (Signed)
Refill request for LEVOTHYROXINE 75MCG TAB #90 TAKE 1 TAB PO QD  [facility]

## 2014-08-27 ENCOUNTER — Ambulatory Visit (AMBULATORY_SURGERY_CENTER): Payer: Self-pay | Admitting: *Deleted

## 2014-08-27 VITALS — Ht 66.0 in | Wt 157.2 lb

## 2014-08-27 DIAGNOSIS — Z1211 Encounter for screening for malignant neoplasm of colon: Secondary | ICD-10-CM

## 2014-08-27 MED ORDER — MOVIPREP 100 G PO SOLR: 1.0000 | Freq: Once | ORAL | Status: DC

## 2014-08-27 NOTE — Progress Notes (Signed)
No egg or soy allergy Pt states with surgery she has hypotension like 90/40 but no other issues No home 02 use No diet pills Pt declined emmi

## 2014-09-10 ENCOUNTER — Encounter: Payer: Self-pay | Admitting: Internal Medicine

## 2014-09-10 ENCOUNTER — Ambulatory Visit (AMBULATORY_SURGERY_CENTER): Payer: Medicare HMO | Admitting: Internal Medicine

## 2014-09-10 VITALS — BP 144/77 | HR 80 | Temp 97.1°F | Resp 19 | Ht 66.0 in | Wt 157.0 lb

## 2014-09-10 DIAGNOSIS — Z1211 Encounter for screening for malignant neoplasm of colon: Secondary | ICD-10-CM

## 2014-09-10 MED ORDER — SODIUM CHLORIDE 0.9 % IV SOLN: 500.0000 mL | INTRAVENOUS | Status: DC

## 2014-09-10 NOTE — Progress Notes (Signed)
No problems noted in the recovery room. maw 

## 2014-09-10 NOTE — Op Note (Signed)
[facility]  [address] [place], [id number]   COLONOSCOPY PROCEDURE REPORT  PATIENT: [name], [name]  MR#: [id number] BIRTHDATE: 1941-04-24 , 17  yrs. old GENDER: female ENDOSCOPIST: Lafayette Dragon, MD REFERRED BY: Dr Chauncey Cruel.Hunter PROCEDURE DATE:  09/10/2014 PROCEDURE:   Colonoscopy, screening First Screening Colonoscopy - Avg.  risk and is 50 yrs.  old or older - No.  Prior Negative Screening - Now for repeat screening. 10 or more years since last screening  History of Adenoma - Now for follow-up colonoscopy & has been > or = to 3 yrs.  N/A ASA CLASS:   Class II INDICATIONS:Screening for colonic neoplasia, Colorectal Neoplasm Risk Assessment for this procedure is average risk, and prior colonoscopy in November 2005 showed moderately severe diverticulosis. MEDICATIONS: Monitored anesthesia care and Propofol 340 mg IV  DESCRIPTION OF PROCEDURE:   After the risks benefits and alternatives of the procedure were thoroughly explained, informed consent was obtained.  The digital rectal exam revealed decreased sphincter tone.   The LB [id number]  endoscope was introduced through the anus and advanced to the cecum, which was identified by the ileocecal valve. No adverse events experienced.   The quality of the prep was good.  (MoviPrep was used)  The instrument was then slowly withdrawn as the colon was fully examined.      COLON FINDINGS: There was moderate diverticulosis noted in the descending colon with associated muscular hypertrophy, tortuosity and angulation.  Retroflexion was not performed due to a narrow rectal vault. The time to cecum = 25.31 Withdrawal time = 7.41 The scope was withdrawn and the procedure completed. COMPLICATIONS: There were no immediate complications.  ENDOSCOPIC IMPRESSION: There was moderate diverticulosis noted in the descending colon Redundant tortuous colon, difficult exam  RECOMMENDATIONS: High fiber diet no recall due  to age  eSigned:  Lafayette Dragon, MD 09/10/2014 9:35 AM   cc:

## 2014-09-10 NOTE — Progress Notes (Signed)
Report to PACU, RN, vss, BBS= Clear.  

## 2014-09-10 NOTE — Patient Instructions (Signed)
YOU HAD AN ENDOSCOPIC PROCEDURE TODAY AT [facility]:   Refer to the procedure report that was given to you for any specific questions about what was found during the examination.  If the procedure report does not answer your questions, please call your gastroenterologist to clarify.  If you requested that your care partner not be given the details of your procedure findings, then the procedure report has been included in a sealed envelope for you to review at your convenience later.  YOU SHOULD EXPECT: Some feelings of bloating in the abdomen. Passage of more gas than usual.  Walking can help get rid of the air that was put into your GI tract during the procedure and reduce the bloating. If you had a lower endoscopy (such as a colonoscopy or flexible sigmoidoscopy) you may notice spotting of blood in your stool or on the toilet paper. If you underwent a bowel prep for your procedure, you may not have a normal bowel movement for a few days.  Please Note:  You might notice some irritation and congestion in your nose or some drainage.  This is from the oxygen used during your procedure.  There is no need for concern and it should clear up in a day or so.  SYMPTOMS TO REPORT IMMEDIATELY:   Following lower endoscopy (colonoscopy or flexible sigmoidoscopy):  Excessive amounts of blood in the stool  Significant tenderness or worsening of abdominal pains  Swelling of the abdomen that is new, acute  Fever of 100F or higher   For urgent or emergent issues, a gastroenterologist can be reached at any hour by calling [phone].   DIET: Your first meal following the procedure should be a small meal and then it is ok to progress to your normal diet. Heavy or fried foods are harder to digest and may make you feel nauseous or bloated.  Likewise, meals heavy in dairy and vegetables can increase bloating.  Drink plenty of fluids but you should avoid alcoholic beverages for 24  hours.  ACTIVITY:  You should plan to take it easy for the rest of today and you should NOT DRIVE or use heavy machinery until tomorrow (because of the sedation medicines used during the test).    FOLLOW UP: Our staff will call the number listed on your records the next business day following your procedure to check on you and address any questions or concerns that you may have regarding the information given to you following your procedure. If we do not reach you, we will leave a message.  However, if you are feeling well and you are not experiencing any problems, there is no need to return our call.  We will assume that you have returned to your regular daily activities without incident.  If any biopsies were taken you will be contacted by phone or by letter within the next 1-3 weeks.  Please call us at [phone] if you have not heard about the biopsies in 3 weeks.    SIGNATURES/CONFIDENTIALITY: You and/or your care partner have signed paperwork which will be entered into your electronic medical record.  These signatures attest to the fact that that the information above on your After Visit Summary has been reviewed and is understood.  Full responsibility of the confidentiality of this discharge information lies with you and/or your care-partner.    Handouts were given to your care partner on diverticulosis and a high fiber diet with liberal fluid intake. You might notice some  irritation in your nose or drainage.  This may cause feelings of congestion.  This is from the oxygen, which can be drying.  This is no cause for concern; this should clear up in a few days.  You may resume your current medications today. Please call if any questions or concerns.

## 2014-09-14 ENCOUNTER — Telehealth: Payer: Self-pay

## 2014-09-14 NOTE — Telephone Encounter (Signed)
  Follow up Call-  Call back number 09/10/2014  Post procedure Call Back phone  # [phone]  Permission to leave phone message Yes     Patient questions:  Do you have a fever, pain , or abdominal swelling? No. Pain Score  0 *  Have you tolerated food without any problems? Yes.    Have you been able to return to your normal activities? Yes.    Do you have any questions about your discharge instructions: Diet   No. Medications  No. Follow up visit  No.  Do you have questions or concerns about your Care? No.  Actions: * If pain score is 4 or above: No action needed, pain <4.

## 2014-09-15 ENCOUNTER — Telehealth: Payer: Self-pay | Admitting: Family Medicine

## 2014-09-15 NOTE — Telephone Encounter (Signed)
See below

## 2014-09-15 NOTE — Telephone Encounter (Signed)
Pt states she was instructed in Jan to take 1 /650  mg tylenol and 1/2 benadryl at night. Pt read the bottle and states it says not to take 8 days straight due to effecting liver. Pt wants to know if it is ok to take this together every night. Before this pt was taking nyquil when she had trouble sleeping, now does the other. pls advise

## 2014-09-15 NOTE — Telephone Encounter (Signed)
Pt.notified

## 2014-09-15 NOTE — Telephone Encounter (Signed)
It is fine to take them together at night as long as you are under supervision of the physician

## 2014-10-29 ENCOUNTER — Other Ambulatory Visit: Payer: Self-pay | Admitting: Family Medicine

## 2014-12-20 ENCOUNTER — Emergency Department (HOSPITAL_COMMUNITY): Payer: Medicare HMO

## 2014-12-20 ENCOUNTER — Encounter (HOSPITAL_COMMUNITY): Payer: Self-pay | Admitting: *Deleted

## 2014-12-20 ENCOUNTER — Emergency Department (HOSPITAL_COMMUNITY)
Admission: EM | Admit: 2014-12-20 | Discharge: 2014-12-20 | Disposition: A | Discharge status: Home / Self Care | Payer: Medicare HMO | Attending: Emergency Medicine | Admitting: Emergency Medicine

## 2014-12-20 DIAGNOSIS — H9190 Unspecified hearing loss, unspecified ear: Secondary | ICD-10-CM | POA: Diagnosis not present

## 2014-12-20 DIAGNOSIS — Z8639 Personal history of other endocrine, nutritional and metabolic disease: Secondary | ICD-10-CM | POA: Diagnosis not present

## 2014-12-20 DIAGNOSIS — Y998 Other external cause status: Secondary | ICD-10-CM | POA: Insufficient documentation

## 2014-12-20 DIAGNOSIS — S42201A Unspecified fracture of upper end of right humerus, initial encounter for closed fracture: Secondary | ICD-10-CM | POA: Insufficient documentation

## 2014-12-20 DIAGNOSIS — Z8659 Personal history of other mental and behavioral disorders: Secondary | ICD-10-CM | POA: Diagnosis not present

## 2014-12-20 DIAGNOSIS — Y9289 Other specified places as the place of occurrence of the external cause: Secondary | ICD-10-CM | POA: Insufficient documentation

## 2014-12-20 DIAGNOSIS — J449 Chronic obstructive pulmonary disease, unspecified: Secondary | ICD-10-CM | POA: Insufficient documentation

## 2014-12-20 DIAGNOSIS — Z720 Tobacco use: Secondary | ICD-10-CM | POA: Diagnosis not present

## 2014-12-20 DIAGNOSIS — Y9301 Activity, walking, marching and hiking: Secondary | ICD-10-CM | POA: Insufficient documentation

## 2014-12-20 DIAGNOSIS — Z8719 Personal history of other diseases of the digestive system: Secondary | ICD-10-CM | POA: Diagnosis not present

## 2014-12-20 DIAGNOSIS — Z8679 Personal history of other diseases of the circulatory system: Secondary | ICD-10-CM | POA: Diagnosis not present

## 2014-12-20 DIAGNOSIS — Z7982 Long term (current) use of aspirin: Secondary | ICD-10-CM | POA: Insufficient documentation

## 2014-12-20 DIAGNOSIS — W108XXA Fall (on) (from) other stairs and steps, initial encounter: Secondary | ICD-10-CM | POA: Insufficient documentation

## 2014-12-20 DIAGNOSIS — Z8669 Personal history of other diseases of the nervous system and sense organs: Secondary | ICD-10-CM | POA: Diagnosis not present

## 2014-12-20 DIAGNOSIS — Z79899 Other long term (current) drug therapy: Secondary | ICD-10-CM | POA: Insufficient documentation

## 2014-12-20 DIAGNOSIS — S4991XA Unspecified injury of right shoulder and upper arm, initial encounter: Secondary | ICD-10-CM | POA: Diagnosis present

## 2014-12-20 MED ORDER — OXYCODONE-ACETAMINOPHEN 5-325 MG PO TABS: 1.0000 | ORAL_TABLET | Freq: Four times a day (QID) | ORAL | Status: DC | PRN

## 2014-12-20 MED ORDER — OXYCODONE-ACETAMINOPHEN 5-325 MG PO TABS
1.0000 | ORAL_TABLET | Freq: Once | ORAL | Status: AC
  Administered 2014-12-20: 1 via ORAL
  Filled 2014-12-20: qty 1

## 2014-12-20 NOTE — Discharge Instructions (Signed)
Return to the ED with any concerns including worsening pain, swelling/numbness/discoloration of fingers or hand, decreased level of alertness/lethargy, or any other alarming symptoms

## 2014-12-20 NOTE — ED Notes (Signed)
Bed: WA25 Expected date:  Expected time:  Means of arrival:  Comments: EMS  

## 2014-12-20 NOTE — ED Notes (Signed)
Per EMS pt coming from home with c/o fall. Per EMS pt sts she was walking up the stairs and slipped and fell injuring her right shoulder, EMS sts no obvious deformity, however very limited ROM due to pain. Pt is not on blood thinners.

## 2014-12-20 NOTE — ED Provider Notes (Signed)
CSN: [id number]     Arrival date & time 12/20/14  45 History   First MD Initiated Contact with Patient 12/20/14 [phone]     Chief Complaint  Patient presents with  . Fall  . Shoulder Injury     (Consider location/radiation/quality/duration/timing/severity/associated sxs/prior Treatment) HPI  Pt presenting with right shoulder pain after fall which occurred just prior to arrival.  She states she tripped due to her shoes not being on properly- she landed on her right shoulder. Denies striking head.  No neck or back pain.  Pain is worse with movement and palpation around the shoulder.  She has not had any treatment prior to arrival.  There are no other associated systemic symptoms, there are no other alleviating or modifying factors.   Past Medical History  Diagnosis Date  . Cigarette smoker   . COPD (chronic obstructive pulmonary disease)   . Asymptomatic varicose veins   . Pure hypercholesterolemia   . Diverticulosis of colon (without mention of hemorrhage)   . Lumbar back pain   . Headache(784.0)   . Hearing loss     wears hearing aides  . Stress disorder, acute   . Varicose veins   . Cataract     beginnings   Past Surgical History  Procedure Laterality Date  . Varicose vein treatment      at [facility] vein center  . Lumbar spine surgery      [id number]  . Colonoscopy  04-22-2004   Family History  Problem Relation Age of Onset  . Diabetes Father   . Heart attack Mother 37    nonsmoker  . Colon cancer Neg Hx   . Rectal cancer Neg Hx   . Stomach cancer Neg Hx    History  Substance Use Topics  . Smoking status: Current Every Day Smoker -- 0.50 packs/day    Types: Cigarettes  . Smokeless tobacco: Never Used     Comment: 1/2-3/4 of a pk a day   . Alcohol Use: No   OB History    No data available     Review of Systems  ROS reviewed and all otherwise negative except for mentioned in HPI    Allergies  Atorvastatin; Ezetimibe; Rosuvastatin; and Nicoderm  Home  Medications   Prior to Admission medications   Medication Sig Start Date End Date Taking? Authorizing Provider  acetaminophen (TYLENOL) 650 MG CR tablet Take 650 mg by mouth every 8 (eight) hours as needed for pain.   Yes Historical Provider, MD  aspirin EC 81 MG EC tablet Take 81 mg by mouth daily.     Yes Historical Provider, MD  estradiol (ESTRACE) 0.1 MG/GM vaginal cream Place 2 g vaginally 2 (two) times a week.    Yes Historical Provider, MD  KRILL OIL 1000 MG CAPS Take 2,000 mg by mouth 2 (two) times daily.    Yes Historical Provider, MD  levothyroxine (SYNTHROID, LEVOTHROID) 75 MCG tablet TAKE ONE TABLET BY MOUTH ONCE DAILY 10/29/14  Yes Marin Olp, MD  PRESCRIPTION MEDICATION Take 1 scoop by mouth daily. calmax powder mixed in juice daily from Dr Ernestina Patches   Yes Historical Provider, MD  oxyCODONE-acetaminophen (PERCOCET/ROXICET) 5-325 MG per tablet Take 1-2 tablets by mouth every 6 (six) hours as needed for severe pain. 12/20/14   Alfonzo Beers, MD   BP 151/75 mmHg  Pulse 73  Temp(Src) 98.1 F (36.7 C) (Oral)  Resp 16  SpO2 97%  Vitals reviewed Physical Exam  Physical Examination: General appearance - alert,  well appearing, and in no distress Mental status - alert, oriented to person, place, and time Eyes - no conjunctival injection, no scleral icterus Neck - no midline tenderness to palpation Chest - clear to auscultation, no wheezes, rales or rhonchi, symmetric air entry Heart - normal rate, regular rhythm, normal S1, S2, no murmurs, rubs, clicks or gallops Neurological - alert, oriented x 3, normal speech, sensation and strength distally intact in right upper extremity Musculoskeletal - ttp diffusely around right shoulder, pain with minimal range of motion,no ttp with ROM or point tenderness of elbow or wrist, otherwise no joint tenderness, deformity or swelling Extremities - peripheral pulses normal, no pedal edema, no clubbing or cyanosis Skin - normal coloration and turgor,  no rashes  ED Course  Procedures (including critical care time) Labs Review Labs Reviewed - No data to display  Imaging Review Dg Shoulder Right  12/20/2014   CLINICAL DATA:  Right shoulder pain secondary to a fall at home today.  EXAM: RIGHT SHOULDER - 2+ VIEW  COMPARISON:  None.  FINDINGS: There is an impacted comminuted fracture of the right humeral head and neck. No dislocation of the humeral head although the head is somewhat subluxed, probably by hemarthrosis. Slight degenerative changes of the [facility] joint.  IMPRESSION: Impacted comminuted fracture of the proximal humerus as described.   Electronically Signed   By: Lorriane Shire M.D.   On: 12/20/2014 16:55     EKG Interpretation None      MDM   Final diagnoses:  Proximal humerus fracture, right, closed, initial encounter    Pt presenting after mechanical fall onto right shoulder.  Xray shows proximal humerus fracture.  Extremity is distally NVI.  Pt placed in sling immobilizer.  Pt given pain medications.  Pt given f/u information for orthopedics.  Discharged with strict return precautions.  Pt agreeable with plan.    Alfonzo Beers, MD 12/20/14 2014

## 2015-04-12 ENCOUNTER — Telehealth: Payer: Self-pay | Admitting: Family Medicine

## 2015-04-12 NOTE — Telephone Encounter (Signed)
ERROR

## 2015-05-28 ENCOUNTER — Other Ambulatory Visit: Payer: Self-pay | Admitting: Otolaryngology

## 2015-05-28 DIAGNOSIS — H9193 Unspecified hearing loss, bilateral: Secondary | ICD-10-CM

## 2015-06-10 ENCOUNTER — Ambulatory Visit
Admission: RE | Admit: 2015-06-10 | Discharge: 2015-06-10 | Disposition: A | Discharge status: Home / Self Care | Payer: Medicare HMO | Source: Ambulatory Visit | Attending: Otolaryngology | Admitting: Otolaryngology

## 2015-06-10 DIAGNOSIS — H9193 Unspecified hearing loss, bilateral: Secondary | ICD-10-CM

## 2015-06-10 MED ORDER — GADOBENATE DIMEGLUMINE 529 MG/ML IV SOLN
13.0000 mL | Freq: Once | INTRAVENOUS | Status: AC | PRN
  Administered 2015-06-10: 13 mL via INTRAVENOUS

## 2015-06-23 ENCOUNTER — Telehealth: Payer: Self-pay | Admitting: Family Medicine

## 2015-06-23 NOTE — Telephone Encounter (Signed)
Do you have these notes?

## 2015-06-23 NOTE — Telephone Encounter (Signed)
Pt called stating that she is waiting for Dr. Yong Channel to call her back and discuss the notes that have been sent over by Dr. Cresenciano Lick. Please contact patient as soon as possible. Please advise.

## 2015-06-23 NOTE — Telephone Encounter (Signed)
Spoke to patient by phone about results- needs to quit smoking, cannot tolerate statin in regards to small vessel disease. Doubt demyelination process

## 2015-07-16 ENCOUNTER — Other Ambulatory Visit (INDEPENDENT_AMBULATORY_CARE_PROVIDER_SITE_OTHER): Payer: Medicare Other

## 2015-07-16 DIAGNOSIS — Z0000 Encounter for general adult medical examination without abnormal findings: Secondary | ICD-10-CM | POA: Diagnosis not present

## 2015-07-16 LAB — CBC WITH DIFFERENTIAL/PLATELET
BASOS ABS: 0 10*3/uL (ref 0.0–0.1)
Basophils Relative: 0.3 % (ref 0.0–3.0)
Eosinophils Absolute: 0.2 10*3/uL (ref 0.0–0.7)
Eosinophils Relative: 2.2 % (ref 0.0–5.0)
HCT: 43.2 % (ref 36.0–46.0)
Hemoglobin: 14.2 g/dL (ref 12.0–15.0)
LYMPHS ABS: 2.4 10*3/uL (ref 0.7–4.0)
Lymphocytes Relative: 27.7 % (ref 12.0–46.0)
MCHC: 32.9 g/dL (ref 30.0–36.0)
MCV: 89.2 fl (ref 78.0–100.0)
MONOS PCT: 6.6 % (ref 3.0–12.0)
Monocytes Absolute: 0.6 10*3/uL (ref 0.1–1.0)
NEUTROS PCT: 63.2 % (ref 43.0–77.0)
Neutro Abs: 5.4 10*3/uL (ref 1.4–7.7)
Platelets: 312 10*3/uL (ref 150.0–400.0)
RBC: 4.84 Mil/uL (ref 3.87–5.11)
RDW: 14.9 % (ref 11.5–15.5)
WBC: 8.5 10*3/uL (ref 4.0–10.5)

## 2015-07-16 LAB — LIPID PANEL
CHOL/HDL RATIO: 4
Cholesterol: 240 mg/dL — ABNORMAL HIGH (ref 0–200)
HDL: 59.5 mg/dL (ref >39.00)
LDL CALC: 152 mg/dL — AB (ref 0–99)
NONHDL: 180.26
Triglycerides: 142 mg/dL (ref 0.0–149.0)
VLDL: 28.4 mg/dL (ref 0.0–40.0)

## 2015-07-16 LAB — POC URINALSYSI DIPSTICK (AUTOMATED)
BILIRUBIN UA: NEGATIVE
Glucose, UA: NEGATIVE
KETONES UA: NEGATIVE
Leukocytes, UA: NEGATIVE
NITRITE UA: NEGATIVE
PH UA: 6.5
Protein, UA: NEGATIVE
RBC UA: NEGATIVE
Urobilinogen, UA: 0.2

## 2015-07-16 LAB — HEPATIC FUNCTION PANEL
ALK PHOS: 78 U/L (ref 39–117)
ALT: 16 U/L (ref 0–35)
AST: 18 U/L (ref 0–37)
Albumin: 4.3 g/dL (ref 3.5–5.2)
BILIRUBIN DIRECT: 0.1 mg/dL (ref 0.0–0.3)
Total Bilirubin: 0.5 mg/dL (ref 0.2–1.2)
Total Protein: 6.8 g/dL (ref 6.0–8.3)

## 2015-07-16 LAB — BASIC METABOLIC PANEL
BUN: 17 mg/dL (ref 6–23)
CO2: 27 mEq/L (ref 19–32)
Calcium: 9.9 mg/dL (ref 8.4–10.5)
Chloride: 100 mEq/L (ref 96–112)
Creatinine, Ser: 0.81 mg/dL (ref 0.40–1.20)
GFR: 72.98 mL/min (ref >60.00)
Glucose, Bld: 88 mg/dL (ref 70–99)
Potassium: 4.3 mEq/L (ref 3.5–5.1)
SODIUM: 135 meq/L (ref 135–145)

## 2015-07-16 LAB — TSH: TSH: 2.36 u[IU]/mL (ref 0.35–4.50)

## 2015-07-22 ENCOUNTER — Ambulatory Visit (INDEPENDENT_AMBULATORY_CARE_PROVIDER_SITE_OTHER): Payer: Medicare Other | Admitting: Family Medicine

## 2015-07-22 ENCOUNTER — Encounter: Payer: Self-pay | Admitting: Family Medicine

## 2015-07-22 VITALS — BP 112/72 | HR 10 | Temp 98.5°F | Ht 65.0 in | Wt 150.0 lb

## 2015-07-22 DIAGNOSIS — Z0001 Encounter for general adult medical examination with abnormal findings: Secondary | ICD-10-CM

## 2015-07-22 DIAGNOSIS — E785 Hyperlipidemia, unspecified: Secondary | ICD-10-CM | POA: Diagnosis not present

## 2015-07-22 DIAGNOSIS — R6889 Other general symptoms and signs: Secondary | ICD-10-CM

## 2015-07-22 MED ORDER — ROSUVASTATIN CALCIUM 10 MG PO TABS: 10.0000 mg | ORAL_TABLET | ORAL | Status: DC

## 2015-07-22 MED ORDER — LEVOTHYROXINE SODIUM 75 MCG PO TABS: 75.0000 ug | ORAL_TABLET | Freq: Every day | ORAL | Status: DC

## 2015-07-22 NOTE — Patient Instructions (Addendum)
Trial rosuvastatin '10mg'$  once a week to see if we can lower cholesterol but not cause you to have the muscle soreness  Also happy to see you in 6 months if want to check in on thyroid and cholesterol on above medicine  Continue to encourage you to quit smoking. Sorry the patches didn't work out- might try the lozenges

## 2015-07-22 NOTE — Progress Notes (Signed)
[name] Reddish, MD Phone: [phone]  Subjective:  Patient presents today for their annual physical. Chief complaint-noted.   See problem oriented charting- ROS- full  review of systems was completed and negative except for: various aches and pains from time to time such as hip. Right arm continues to ache at times after prior fracture last year  The following were reviewed and entered/updated in epic: Past Medical History  Diagnosis Date  . Cigarette smoker   . COPD (chronic obstructive pulmonary disease) ([place])   . Asymptomatic varicose veins   . Pure hypercholesterolemia   . Diverticulosis of colon (without mention of hemorrhage)   . Lumbar back pain   . Headache(784.0)   . Hearing loss     wears hearing aides  . Stress disorder, acute   . Varicose veins   . Cataract     beginnings   Patient Active Problem List   Diagnosis Date Noted  . CIGARETTE SMOKER 10/09/2007    Priority: High  . Hypothyroidism 10/12/2007    Priority: Medium  . HYPERCHOLESTEROLEMIA 10/08/2007    Priority: Medium  . COPD (chronic obstructive pulmonary disease) ([place]) 10/08/2007    Priority: Medium  . Varicose veins 12/02/2013    Priority: Low  . OA (osteoarthritis) 08/15/2012    Priority: Low  . Hearing loss 08/16/2009    Priority: Low  . BACK PAIN, LUMBAR 10/08/2007    Priority: Low   Past Surgical History  Procedure Laterality Date  . Varicose vein treatment      at [facility] vein center  . Lumbar spine surgery      [id number]  . Colonoscopy  04-22-2004    Family History  Problem Relation Age of Onset  . Diabetes Father   . Heart attack Mother 83    nonsmoker  . Colon cancer Neg Hx   . Rectal cancer Neg Hx   . Stomach cancer Neg Hx     Medications- reviewed and updated Current Outpatient Prescriptions  Medication Sig Dispense Refill  . acetaminophen (TYLENOL) 650 MG CR tablet Take 650 mg by mouth every 8 (eight) hours as needed for pain.    . Ascorbic Acid (VITAMIN C WITH  ROSE HIPS) 500 MG tablet Take 500 mg by mouth daily.    Marland Kitchen aspirin EC 81 MG EC tablet Take 81 mg by mouth daily.      . Cholecalciferol (VITAMIN D3 PO) Take 200 Units by mouth.    . levothyroxine (SYNTHROID, LEVOTHROID) 75 MCG tablet TAKE ONE TABLET BY MOUTH ONCE DAILY 90 tablet 2   No current facility-administered medications for this visit.    Allergies-reviewed and updated Allergies  Allergen Reactions  . Atorvastatin     Intolerant to all statins   . Ezetimibe      INTOL to Zetia  . Rosuvastatin     intolerant to all statins  . Gadolinium Derivatives Nausea Only    Pt became very nauseous after gadolinium administration//lh  . Nicoderm [Nicotine] Rash    Unable to use the patches---caused severe rash to area placed    Social History   Social History  . Marital Status: Married    Spouse Name: N/A  . Number of Children: 2  . Years of Education: N/A   Occupational History  . retired    Social History Main Topics  . Smoking status: Current Every Day Smoker -- 0.50 packs/day    Types: Cigarettes  . Smokeless tobacco: Never Used     Comment: 1/2-3/4 of a pk  a day   . Alcohol Use: No  . Drug Use: No  . Sexual Activity: Not Asked   Other Topics Concern  . None   Social History Narrative   Married 2008 (3rd marriage). 2 sons by first husband. 3 grandkids.       Retired [employer] for 40 years      Hobbies: play Education officer, museum games, go to casino ([place])    ROS--See HPI   Objective: BP 112/72 mmHg  Pulse 10  Temp(Src) 98.5 F (36.9 C)  Ht '5\' 5"'$  (1.651 m)  Wt 150 lb (68.04 kg)  BMI 24.96 kg/m2 Gen: NAD, resting comfortably HEENT: Mucous membranes are moist. Oropharynx normal. Hearing aids in place Neck: no thyromegaly CV: RRR no murmurs rubs or gallops Lungs: CTAB no crackles, wheeze, rhonchi Abdomen: soft/nontender/nondistended/normal bowel sounds. No rebound or guarding.  Ext: no edema Skin: warm, dry, no rash except for in patch like area  where had previous patch Neuro: grossly normal, moves all extremities, PERRLA  Assessment/Plan:  75 y.o. female presenting for annual physical.  Health Maintenance counseling: 1. Anticipatory guidance: Patient counseled regarding regular dental exams, eye exams, wearing seatbelts.  2. Risk factor reduction:  Advised patient of need for regular exercise and diet rich and fruits and vegetables to reduce risk of heart attack and stroke.  3. Immunizations/screenings/ancillary studies Immunization History  Administered Date(s) Administered  . Influenza Split 04/04/2011  . Influenza Whole 03/19/2009, 07/05/2010  . Influenza,inj,Quad PF,36+ Mos 03/19/2014  . Influenza-Unspecified 03/19/2013, 03/28/2015  . Pneumococcal Conjugate-13 02/17/2014  . Pneumococcal Polysaccharide-23 08/16/2010  . Td 08/16/2010  . Zoster 04/04/2011   4. Cervical cancer screening- not indicated at age 35. Breast cancer screening-  breast exam with Dr. Nori Riis and mammogram through her office.  6. Colon cancer screening - last colonoscopy 08/2014 was told no recall 7. Skin cancer screening- sees dermatology Dr. Nevada Crane  Hyperlipidemia- trial once a week atorvastatin given MRI changes with small vessel ischemic disease along with smoking- reduce stroke risk. #s are some better this year Remains on aspirin  Cigarette smoking- rash with patches again. Going to continue to try to cut down  Hypothyroidism- controlled on levothyroxine  Unclear COPD history- denies shortness of breath Return in about 1 year (around 07/21/2016) for physical. Return precautions advised.   Meds ordered this encounter  Medications  . Ascorbic Acid (VITAMIN C WITH ROSE HIPS) 500 MG tablet    Sig: Take 500 mg by mouth daily.  . Cholecalciferol (VITAMIN D3 PO)    Sig: Take 200 Units by mouth.

## 2015-08-17 ENCOUNTER — Telehealth: Payer: Self-pay | Admitting: Family Medicine

## 2015-08-17 NOTE — Telephone Encounter (Signed)
Pt.notified

## 2015-08-17 NOTE — Telephone Encounter (Signed)
Patient thinks she has developed a problem with medication Crestor, when she use the bathroom to urine bile drip out, it is brown but look like phlegm and also her underwear throughout the day.  She is very concerned.she ask for a call back. Thank You

## 2015-08-17 NOTE — Telephone Encounter (Signed)
Pt states she started on crestor 1 pill a week 2 weeks ago and she has been very allergic to statin drugs and that's why they took her off of Crestor/ rosuvastatin but you wanted her to try taking it again. This leakage of fluid happens throughout the day along with a few abdominal pains that arent severe and eventually goes away and it all started when she started back on Crestor which is why she thinks it maybe related to the medication. She denies any SOB, fever, chills and N/V. Please advise.

## 2015-08-17 NOTE — Telephone Encounter (Signed)
She may stop medication and see if there is resolution. If not within a week or two- follow up with me.   She is basically having diarrhea and abdominal pain on medicine correct?

## 2015-09-16 ENCOUNTER — Telehealth: Payer: Self-pay | Admitting: Family Medicine

## 2015-09-16 NOTE — Telephone Encounter (Signed)
Pt has an appointment with dr Jori Moll neal on 12-04-15 and needs a referrals  for complete physical and mammogram. Pt has [facility] medicare complete. Dr Nori Riis phone # [phone]

## 2015-09-16 NOTE — Telephone Encounter (Signed)
[name] [name] - Please call [name] and advise her that if Dr Nori Riis wil be doing her complete physical then she will need to change the PCP on her card to Dr Nori Riis. Otherwise, Dr Yong Channel would do her CPE and Dr Nori Riis will do her pap. She does not need a referral for routine health maintenance [facility] does not require) and she only needs to call where she wants to have her screening mammogram done and schedule an appointment.

## 2015-09-16 NOTE — Telephone Encounter (Signed)
See below [employer]

## 2015-10-19 ENCOUNTER — Other Ambulatory Visit: Payer: Self-pay | Admitting: Family Medicine

## 2015-10-19 DIAGNOSIS — Z124 Encounter for screening for malignant neoplasm of cervix: Secondary | ICD-10-CM

## 2015-10-20 NOTE — Telephone Encounter (Signed)
Pt is aware she can have pap and mammogram with dr neal only. Pt had cpx with dr hunter on 07-22-15

## 2015-10-28 DIAGNOSIS — H5203 Hypermetropia, bilateral: Secondary | ICD-10-CM | POA: Diagnosis not present

## 2015-10-28 DIAGNOSIS — H2523 Age-related cataract, morgagnian type, bilateral: Secondary | ICD-10-CM | POA: Diagnosis not present

## 2015-10-28 DIAGNOSIS — H524 Presbyopia: Secondary | ICD-10-CM | POA: Diagnosis not present

## 2015-10-28 DIAGNOSIS — H52223 Regular astigmatism, bilateral: Secondary | ICD-10-CM | POA: Diagnosis not present

## 2015-11-24 DIAGNOSIS — R898 Other abnormal findings in specimens from other organs, systems and tissues: Secondary | ICD-10-CM | POA: Diagnosis not present

## 2015-11-24 DIAGNOSIS — Z124 Encounter for screening for malignant neoplasm of cervix: Secondary | ICD-10-CM | POA: Diagnosis not present

## 2015-11-24 DIAGNOSIS — Z1231 Encounter for screening mammogram for malignant neoplasm of breast: Secondary | ICD-10-CM | POA: Diagnosis not present

## 2015-11-24 DIAGNOSIS — Z6823 Body mass index (BMI) 23.0-23.9, adult: Secondary | ICD-10-CM | POA: Diagnosis not present

## 2015-11-24 DIAGNOSIS — Z01419 Encounter for gynecological examination (general) (routine) without abnormal findings: Secondary | ICD-10-CM | POA: Diagnosis not present

## 2015-12-27 ENCOUNTER — Telehealth: Payer: Self-pay | Admitting: Family Medicine

## 2015-12-27 NOTE — Telephone Encounter (Signed)
Pt needs a referral to dr Marga Hoots for right ring finger pain. Pt has an appt on 01/04/16

## 2015-12-29 NOTE — Telephone Encounter (Signed)
Yes thanks 

## 2015-12-30 ENCOUNTER — Other Ambulatory Visit: Payer: Self-pay

## 2015-12-30 DIAGNOSIS — M79644 Pain in right finger(s): Secondary | ICD-10-CM

## 2016-01-04 DIAGNOSIS — M65341 Trigger finger, right ring finger: Secondary | ICD-10-CM | POA: Diagnosis not present

## 2016-02-03 DIAGNOSIS — M65341 Trigger finger, right ring finger: Secondary | ICD-10-CM | POA: Diagnosis not present

## 2016-03-13 DIAGNOSIS — H20021 Recurrent acute iridocyclitis, right eye: Secondary | ICD-10-CM | POA: Diagnosis not present

## 2016-03-13 DIAGNOSIS — H2513 Age-related nuclear cataract, bilateral: Secondary | ICD-10-CM | POA: Diagnosis not present

## 2016-04-06 ENCOUNTER — Other Ambulatory Visit: Payer: Self-pay | Admitting: Family Medicine

## 2016-06-06 ENCOUNTER — Telehealth: Payer: Self-pay | Admitting: Family Medicine

## 2016-06-06 NOTE — Telephone Encounter (Signed)
Noted  

## 2016-06-06 NOTE — Telephone Encounter (Signed)
Patient Name: [name] [name] DOB: [birth year] Initial Comment Caller states she's having double vision off and on. Nurse Assessment Nurse: Vallery Sa, RN, Cathy Date/Time (Eastern Time): 06/06/2016 10:53:08 AM Confirm and document reason for call. If symptomatic, describe symptoms. ---[name] states she has been having double vision on and off since April 05, 2016. It happened again on 06/03/2016. No injury in the past 3 days. No severe breathing difficulty. No dizziness. No fever. Alert and responsive. Does the patient have any new or worsening symptoms? ---Yes Will a triage be completed? ---Yes Related visit to physician within the last 2 weeks? ---No Does the PT have any chronic conditions? (i.e. diabetes, asthma, etc.) ---Yes List chronic conditions. ---Thyroid problems, High Cholesterol Is this a behavioral health or substance abuse call? ---No Guidelines Guideline Title Affirmed Question Affirmed Notes Vision Loss or Change [1] Brief (now gone) blurred vision AND [2] unexplained Final Disposition User See PCP When Office is Open (within 3 days) Vallery Sa, RN, Cathy Comments Scheduled for 06/07/16 2:30pm appointment with Dr. Elease Hashimoto. Referrals REFERRED TO PCP OFFICE Disagree/Comply: Comply

## 2016-06-07 ENCOUNTER — Ambulatory Visit (INDEPENDENT_AMBULATORY_CARE_PROVIDER_SITE_OTHER): Payer: Medicare Other | Admitting: Family Medicine

## 2016-06-07 VITALS — BP 150/80 | HR 85 | Temp 97.8°F | Ht 65.0 in | Wt 150.6 lb

## 2016-06-07 DIAGNOSIS — H532 Diplopia: Secondary | ICD-10-CM | POA: Diagnosis not present

## 2016-06-07 NOTE — Progress Notes (Signed)
Subjective:     Patient ID: [name] [name], female   DOB: Oct 02, 1940, 75 y.o.   MRN: [id number]  HPI Patient seen with 5 episodes of binocular diplopia since October 18. First episode occurred when she was sitting at a restaurant having a meal with her son. She then had subsequent episodes on November 17, December 3, December 14, and December 16. These were clearly binocular. Her symptoms resolved when closing either the right or left. Each episode lasted about 3 minutes. She did not have any associated headaches. No history of migraines. She denies any recent appetite or weight changes. No focal weakness. No speech changes. No ataxia. Does occasionally feel "off balance ". She has had some right hearing loss and ENT ordered MRI a year ago with no evidence for vestibular schwannoma but she did have some chronic small vessel ischemic changes involving the pons, cerebellum, and cerebral hemispheric white matter. There was possibility raised of mild demyelinating disease felt to be less likely.  She has had generally eye exams each year but not recently. Denies any blurred vision. She has hypothyroidism and TSH was normal about 10 months ago  Past Medical History:  Diagnosis Date  . Asymptomatic varicose veins   . Cataract    beginnings  . Cigarette smoker   . COPD (chronic obstructive pulmonary disease) ([place])   . Diverticulosis of colon (without mention of hemorrhage)   . Headache(784.0)   . Hearing loss    wears hearing aides  . Lumbar back pain   . Pure hypercholesterolemia   . Stress disorder, acute   . Varicose veins    Past Surgical History:  Procedure Laterality Date  . COLONOSCOPY  04-22-2004  . LUMBAR SPINE SURGERY     [phone]  . varicose vein treatment     at [place] [facility]    reports that she has been smoking Cigarettes.  She has a 20.00 pack-year smoking history. She has never used smokeless tobacco. She reports that she does not drink alcohol or use drugs. family  history includes Diabetes in her father; Heart attack (age of onset: 52) in her mother. Allergies  Allergen Reactions  . Atorvastatin     Intolerant to all statins   . Ezetimibe      INTOL to Zetia  . Rosuvastatin     intolerant to all statins  . Gadolinium Derivatives Nausea Only    Pt became very nauseous after gadolinium administration//lh  . Nicoderm [Nicotine] Rash    Unable to use the patches---caused severe rash to area placed     Review of Systems  Constitutional: Negative for appetite change, chills, fever and unexpected weight change.  Eyes: Positive for visual disturbance. Negative for pain and discharge.  Respiratory: Negative for shortness of breath.   Cardiovascular: Negative for chest pain.  Neurological: Negative for dizziness, syncope, weakness and headaches.       Objective:   Physical Exam  Constitutional: She is oriented to person, place, and time. She appears well-developed and well-nourished.  Eyes: Conjunctivae and EOM are normal. Pupils are equal, round, and reactive to light.  Neck: Neck supple. No thyromegaly present.  Cardiovascular: Normal rate and regular rhythm.   Pulmonary/Chest: Effort normal and breath sounds normal. No respiratory distress. She has no wheezes. She has no rales.  Neurological: She is alert and oriented to person, place, and time. No cranial nerve deficit. Coordination normal.  No focal weakness  Psychiatric: She has a normal mood and affect. Her behavior is  normal.       Assessment:     Binocular diplopia with 5 distinct episodes since October 18.  Non-focal exam neurologically.      Plan:     Set up MRI and MR angiogram to further assess -Consider neurology referral especially in view of abnormal MRI findings from last year-  Eulas Post MD [facility]

## 2016-06-07 NOTE — Patient Instructions (Signed)
Diplopia Introduction Diplopia is the condition of having double vision or seeing two of a single object. There are many causes of diplopia. Some are not dangerous and can be easily corrected. Diplopia may also be a symptom of a serious medical problem. There are two types of diplopia.  Monocular diplopia. This is double vision that affects only one eye. Monocular diplopia is often caused by a clouding of the lens in your eye (cataract) or by disruptions in the way that your eye focuses light.  Binocular diplopia. This is double vision that affects both eyes. However, when you shut one eye, the double vision will go away. Binocular diplopia may be more serious. It can be caused by:  Problems with the nerves or muscles that are responsible for eye movement.  Neurologic diseases.  Thyroid problems.  Tumors.  An infection near your eyes.  A stroke. You may need to see a health care provider who specializes in eye conditions (ophthalmologist) or a nerve specialist (neurologist) to find the cause. Follow these instructions at home:  Tell your health care provider about any changes in your vision.  Do not drive or operate heavy machinery if diplopia interferes with your vision.  Keep all follow-up visits as directed by your health care provider. This is important. Contact a health care provider if:  Your diplopia gets worse.  You develop any other symptoms along with your diplopia, such as:  Weakness.  Numbness.  Headache.  Eye pain.  Clumsiness.  Nausea.  Drooping eyelids.  Abnormal movement of one of your eyes. Get help right away if:  You have sudden vision loss.  You suddenly get a very bad headache.  You have sudden weakness or numbness.  You suddenly lose the ability to speak, understand speech, or both. This information is not intended to replace advice given to you by your health care provider. Make sure you discuss any questions you have with your health  care provider. Document Released: 04/06/2004 Document Revised: 11/11/2015 Document Reviewed: 04/29/2014  2017 Elsevier  We will set up neurology referral as well as MRA and MRI of the brain to further assess

## 2016-06-07 NOTE — Progress Notes (Signed)
Pre visit review using our clinic review tool, if applicable. No additional management support is needed unless otherwise documented below in the visit note. 

## 2016-06-16 ENCOUNTER — Ambulatory Visit
Admission: RE | Admit: 2016-06-16 | Discharge: 2016-06-16 | Disposition: A | Discharge status: Home / Self Care | Payer: Medicare Other | Source: Ambulatory Visit | Attending: Family Medicine | Admitting: Family Medicine

## 2016-06-16 DIAGNOSIS — H532 Diplopia: Secondary | ICD-10-CM

## 2016-06-16 DIAGNOSIS — H5330 Unspecified disorder of binocular vision: Secondary | ICD-10-CM | POA: Diagnosis not present

## 2016-06-16 MED ORDER — GADOBENATE DIMEGLUMINE 529 MG/ML IV SOLN
15.0000 mL | Freq: Once | INTRAVENOUS | Status: AC | PRN
  Administered 2016-06-16: 14 mL via INTRAVENOUS

## 2016-06-22 ENCOUNTER — Other Ambulatory Visit: Payer: Self-pay | Admitting: *Deleted

## 2016-06-22 MED ORDER — LEVOTHYROXINE SODIUM 75 MCG PO TABS: 75.0000 ug | ORAL_TABLET | Freq: Every day | ORAL | 1 refills | Status: DC

## 2016-06-30 ENCOUNTER — Ambulatory Visit (INDEPENDENT_AMBULATORY_CARE_PROVIDER_SITE_OTHER): Payer: Medicare HMO | Admitting: Neurology

## 2016-06-30 ENCOUNTER — Other Ambulatory Visit: Payer: Medicare HMO

## 2016-06-30 ENCOUNTER — Encounter: Payer: Self-pay | Admitting: Neurology

## 2016-06-30 VITALS — BP 144/82 | HR 79 | Ht 65.5 in | Wt 148.4 lb

## 2016-06-30 DIAGNOSIS — I679 Cerebrovascular disease, unspecified: Secondary | ICD-10-CM

## 2016-06-30 DIAGNOSIS — H532 Diplopia: Secondary | ICD-10-CM | POA: Diagnosis not present

## 2016-06-30 DIAGNOSIS — R69 Illness, unspecified: Secondary | ICD-10-CM | POA: Diagnosis not present

## 2016-06-30 DIAGNOSIS — R030 Elevated blood-pressure reading, without diagnosis of hypertension: Secondary | ICD-10-CM | POA: Diagnosis not present

## 2016-06-30 DIAGNOSIS — F17210 Nicotine dependence, cigarettes, uncomplicated: Secondary | ICD-10-CM

## 2016-06-30 NOTE — Progress Notes (Addendum)
NEUROLOGY CONSULTATION NOTE  [name] [name] MRN: [id number] DOB: 04-11-1941  Referring provider: Dr. Elease Hashimoto Primary care provider: Dr. Yong Channel  Reason for consult:  diplopia  HISTORY OF PRESENT ILLNESS: [name] [name] is a 76 year old right-handed female with hypercholesterolemia and COPD who presents for visual disturbance.  History supplemented by PCP note.  From October to December 2017, she head 6 distinct episodes of transient diplopia.  The diplopia was horizontal and resolved when closing either eye.  There was no associated headache, dizziness, facial numbness, slurred speech, dysphagia or unilateral weakness or numbness.  It lasted 2 to 3 minutes and spontaneously resolved.  Each episode occurred about an hour after playing games on the computer, playing a crossword puzzle or reading her bible.  It was not associated with  Any particular time of day.  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  In 2002, she had an episode which may have been a "mini stroke".  She had episode of syncope and wasn't able to speak for 2 hours afterwards.  MRI brain report from 12/14/00 again demonstrated multiple white matter signal abnormality in the cerebral hemispheres and cerebellum.  She takes aspirin daily.  She has hyperlipidemia but unable to tolerate several statin drugs.  She is a cigarette smoker.  PAST MEDICAL HISTORY: Past Medical History:  Diagnosis Date  . Asymptomatic varicose veins   . Cataract    beginnings  . Cigarette smoker   . COPD (chronic obstructive pulmonary disease) ([place])   . Diverticulosis of colon  (without mention of hemorrhage)   . Headache(784.0)   . Hearing loss    wears hearing aides  . Lumbar back pain   . Pure hypercholesterolemia   . Stress disorder, acute   . Varicose veins     PAST SURGICAL HISTORY: Past Surgical History:  Procedure Laterality Date  . COLONOSCOPY  04-22-2004  . LUMBAR SPINE SURGERY     [phone]  . varicose vein treatment     at [facility] vein center    MEDICATIONS: Current Outpatient Prescriptions on File Prior to Visit  Medication Sig Dispense Refill  . aspirin EC 81 MG EC tablet Take 81 mg by mouth daily.      Marland Kitchen levothyroxine (SYNTHROID, LEVOTHROID) 75 MCG tablet Take 1 tablet (75 mcg total) by mouth daily. 90 tablet 1  . acetaminophen (TYLENOL) 650 MG CR tablet Take 650 mg by mouth every 8 (eight) hours as needed for pain.    . Ascorbic Acid (VITAMIN C WITH ROSE HIPS) 500 MG tablet Take 500 mg by mouth daily.    . Cholecalciferol (VITAMIN D3 PO) Take 200 Units by mouth.     No current facility-administered medications on file prior to visit.     ALLERGIES: Allergies  Allergen Reactions  . Atorvastatin     Intolerant to all statins   . Ezetimibe      INTOL to Zetia  . Rosuvastatin     intolerant to all statins  . Gadolinium Derivatives Nausea Only    Pt became very nauseous after gadolinium administration//lh  . Nicoderm [Nicotine] Rash    Unable to use the patches---caused severe rash  to area placed    FAMILY HISTORY: Family History  Problem Relation Age of Onset  . Diabetes Father   . Heart attack Mother 59    nonsmoker  . Colon cancer Neg Hx   . Rectal cancer Neg Hx   . Stomach cancer Neg Hx     SOCIAL HISTORY: Social History   Social History  . Marital status: Married    Spouse name: N/A  . Number of children: 2  . Years of education: N/A   Occupational History  . retired Retired   Social History Main Topics  . Smoking status: Current Every Day Smoker    Packs/day: 0.50    Years: 40.00    Types:  Cigarettes  . Smokeless tobacco: Never Used     Comment: 1/2-3/4 of a pk a day   . Alcohol use No  . Drug use: No  . Sexual activity: Not on file   Other Topics Concern  . Not on file   Social History Narrative   Married 2008 (3rd marriage). 2 sons by first husband. 3 grandkids.       Retired [employer] for 40 years      Hobbies: Designer, multimedia games, go to casino ([place])    REVIEW OF SYSTEMS: Constitutional: No fevers, chills, or sweats, no generalized fatigue, change in appetite Eyes: No visual changes, double vision, eye pain Ear, nose and throat: No hearing loss, ear pain, nasal congestion, sore throat Cardiovascular: No chest pain, palpitations Respiratory:  No shortness of breath at rest or with exertion, wheezes GastrointestinaI: No nausea, vomiting, diarrhea, abdominal pain, fecal incontinence Genitourinary:  No dysuria, urinary retention or frequency Musculoskeletal:  No neck pain, back pain Integumentary: No rash, pruritus, skin lesions Neurological: as above Psychiatric: No depression, insomnia, anxiety Endocrine: No palpitations, fatigue, diaphoresis, mood swings, change in appetite, change in weight, increased thirst Hematologic/Lymphatic:  No purpura, petechiae. Allergic/Immunologic: no itchy/runny eyes, nasal congestion, recent allergic reactions, rashes  PHYSICAL EXAM: Vitals:   06/30/16 1252  BP: (!) 144/82  Pulse: 79   General: No acute distress.  Patient appears well-groomed.  Head:  Normocephalic/atraumatic Eyes:  fundi examined but not visualized Neck: supple, no paraspinal tenderness, full range of motion Back: No paraspinal tenderness Heart: regular rate and rhythm Lungs: Clear to auscultation bilaterally. Vascular: No carotid bruits. Neurological Exam: Mental status: alert and oriented to person, place, and time, recent and remote memory intact, fund of knowledge intact, attention and concentration intact, speech fluent and not  dysarthric, language intact. Cranial nerves: CN I: not tested CN II: pupils equal, round and reactive to light, visual fields intact CN III, IV, VI:  full range of motion, no nystagmus, no ptosis CN V: facial sensation intact CN VII: upper and lower face symmetric CN VIII: hearing intact CN IX, X: gag intact, uvula midline CN XI: sternocleidomastoid and trapezius muscles intact CN XII: tongue midline Bulk & Tone: normal, no fasciculations. Motor:  5/5 throughout  Sensation: temperature and vibration sensation intact. Deep Tendon Reflexes:  2+ throughout, toes downgoing.  Finger to nose testing:  Without dysmetria.  Heel to shin:  Without dysmetria.  Gait:  Normal station and stride.  Able to turn and tandem walk. Romberg negative.  IMPRESSION: Recurrent horizontal diplopia.  Cerebrovascular event possible if she has a focal arterial stenosis in the posterior circulation.  Another possibility is myasthenia gravis. Cerebrovascular disease Cigarette smoker Elevated blood pressure  PLAN: 1.  Will check CTA of head to evaluate for focal  vertebrobasilar stenosis to account for recurrent diplopia. 2.  Check myasthenia panel 3.  On antiplatelet therapy.  Ideally, should be on statin therapy but cannot tolerate. 4.  Discussed smoking cessation 5. Blood pressure slightly elevated.  Recheck with PCP. 6.  Further recommendations pending results. ADDENDUM:  MRA of head revealed no vertebrobasilar stenosis.  Myasthenia gravis panel was negative.  EMG was negative for myasthenia gravis.  No explanation for symptoms at this time.  Would continue antiplatelet therapy.  Thank you for allowing me to take part in the care of this patient.  Metta Clines, DO  CC:  [name], MD  Carolann Littler, MD

## 2016-06-30 NOTE — Patient Instructions (Signed)
The double vision may be due to mini strokes or it could be due to weakness in the eye muscles. 1.  We will check CTA of the head to look at the blood vessels in the head 2.  We will also check a myasthenia gravis panel 3.  Quit smoking  4.  Follow up pending results

## 2016-07-04 ENCOUNTER — Telehealth: Payer: Self-pay | Admitting: Neurology

## 2016-07-04 ENCOUNTER — Other Ambulatory Visit: Payer: Self-pay | Admitting: Neurology

## 2016-07-04 NOTE — Telephone Encounter (Signed)
[facility] left a message regarding PT and needing an authorization for CT scan/[place] CB# [phone] [id number]

## 2016-07-04 NOTE — Telephone Encounter (Signed)
Returned call. Patient will need CT Head PA.

## 2016-07-06 ENCOUNTER — Other Ambulatory Visit: Payer: Self-pay | Admitting: Neurology

## 2016-07-07 ENCOUNTER — Ambulatory Visit
Admission: RE | Admit: 2016-07-07 | Discharge: 2016-07-07 | Disposition: A | Discharge status: Home / Self Care | Payer: Medicare HMO | Source: Ambulatory Visit | Attending: Neurology | Admitting: Neurology

## 2016-07-07 ENCOUNTER — Other Ambulatory Visit: Payer: Medicare HMO

## 2016-07-07 DIAGNOSIS — H532 Diplopia: Secondary | ICD-10-CM | POA: Diagnosis not present

## 2016-07-07 DIAGNOSIS — I679 Cerebrovascular disease, unspecified: Secondary | ICD-10-CM

## 2016-07-07 LAB — MYASTHENIA GRAVIS PANEL 2
ACETYLCHOLINE REC MOD AB: 7 %{inhibition}
Acetylcholine Rec Binding: <0.3 nmol/L
Aceytlcholine Rec Bloc Ab: <15 % of inhibition (ref <15)

## 2016-07-07 NOTE — Telephone Encounter (Signed)
CT Head w/o contrast approval. PA# [id number] approved thru 10/05/16. Informed GI ([facility]) will call and reschedule patient.

## 2016-07-11 ENCOUNTER — Telehealth: Payer: Self-pay

## 2016-07-11 DIAGNOSIS — R200 Anesthesia of skin: Secondary | ICD-10-CM

## 2016-07-11 DIAGNOSIS — G7000 Myasthenia gravis without (acute) exacerbation: Secondary | ICD-10-CM

## 2016-07-11 NOTE — Telephone Encounter (Signed)
-----   Message from Pieter Partridge, DO sent at 07/10/2016 12:18 PM EST ----- Blood work for myasthenia gravis is negative.  CT of head is unremarkable.  CTA was not performed but I don't think it is indicated at this time.  I would like to get [facility] to evaluate for myasthenia gravis.

## 2016-07-11 NOTE — Telephone Encounter (Signed)
Spoke to patient. Gave results.  Patient verbalized understanding. Sent to front office to schedule NCV EMG

## 2016-07-11 NOTE — Addendum Note (Signed)
Addended by: Milta Deiters on: 07/11/2016 09:49 AM   Modules accepted: Orders

## 2016-07-18 ENCOUNTER — Ambulatory Visit (INDEPENDENT_AMBULATORY_CARE_PROVIDER_SITE_OTHER): Payer: Medicare HMO | Admitting: Neurology

## 2016-07-18 DIAGNOSIS — R200 Anesthesia of skin: Secondary | ICD-10-CM

## 2016-07-18 DIAGNOSIS — G5601 Carpal tunnel syndrome, right upper limb: Secondary | ICD-10-CM

## 2016-07-18 DIAGNOSIS — H532 Diplopia: Secondary | ICD-10-CM

## 2016-07-18 NOTE — Procedures (Signed)
[facility]  [address]  [place], [place] [address] Tel: [phone] Fax:  [phone] Test Date:  07/18/2016  Patient: [name] [name] DOB: [birth year] Physician: Narda Amber, DO  Sex: Female Height: '5\' 5"'$  Ref Phys: Metta Clines, D.O.  ID#: [id number] Temp: 33.3 Technician: M. Dean   Patient Complaints: This is a 76 year old female referred for evaluation of episodic double vision and right hand paresthesias.   NCV & EMG Findings: Extensive electrodiagnostic testing of the right upper and lower extremity shows:  1. Right median sensory response shows prolonged latency (4.4 ms) and reduced amplitude (9.1 V).  Right ulnar, sural, and superficial peroneal sensory responses are within normal limits. 2. Right median motor response shows prolonged latency (5.1 ms) and normal amplitude. Right ulnar, tibial, and peroneal motor responses are within normal limits. 3. Repetitive nerve stimulation of the spinal accessory, median, and peroneal nerves recording at the trapezius, abductor pollicis brevis, and tibialis anterior, respectively, is within normal limits. 4. There is no evidence of active or chronic motor axon loss changes affecting any of the tested muscles.    Impression: 1. Right median neuropathy at or distal to the wrist, consistent with the clinical diagnosis of carpal tunnel syndrome. Overall, these findings are moderate to severe in degree electrically. 2. There is no evidence of a neuromuscular junction disorder (i.e. myasthenia gravis), diffuse myopathy, or cervical/lumbosacral radiculopathy affecting the right side.    ___________________________ Narda Amber, DO    Nerve Conduction Studies Anti Sensory Summary Table   Site NR Peak (ms) Norm Peak (ms) P-T Amp (V) Norm P-T Amp  Right Median Anti Sensory (2nd Digit)  33.3C  Wrist    4.4 <3.8 9.1 >10  Right Sup Peroneal Anti Sensory (Ant Lat Mall)  12 cm    2.9 <4.6 13.1 >3  Right Sural Anti  Sensory (Lat Mall)  Calf    3.2 <4.6 6.0 >3  Right Ulnar Anti Sensory (5th Digit)  33.3C  Wrist    3.1 <3.2 17.2 >5   Motor Summary Table   Site NR Onset (ms) Norm Onset (ms) O-P Amp (mV) Norm O-P Amp Site1 Site2 Delta-0 (ms) Dist (cm) Vel (m/s) Norm Vel (m/s)  Right Median Motor (Abd Poll Brev)  33.3C  Wrist    5.1 <4.0 6.5 >5 Elbow Wrist 3.7 24.0 65 >50  Elbow    8.8  6.5         Right Peroneal Motor (Ext Dig Brev)  33.3C  Ankle    5.2 <6.0 3.3 >2.5 B Fib Ankle 7.2 33.0 46 >40  B Fib    12.4  3.2  Poplt B Fib 2.1 10.0 48 >40  Poplt    14.5  3.0         Right Tibial Motor (Abd Hall Brev)  33.3C  Ankle    3.4 <6.0 9.4 >4 Knee Ankle 8.9 38.0 43 >40  Knee    12.3  6.5         Right Ulnar Motor (Abd Dig Minimi)  33.3C  Wrist    3.1 <3.1 9.9 >7 B Elbow Wrist 3.2 18.0 56 >50  B Elbow    6.3  9.0  A Elbow B Elbow 1.5 10.0 67 >50  A Elbow    7.8  8.5          EMG   Side Muscle Ins Act Fibs Psw Fasc Number Recrt Dur Dur. Amp Amp. Poly Poly. Comment  Right 1stDorInt Nml Nml Nml  Nml Nml Nml Nml Nml Nml Nml Nml Nml N/A  Right Abd Poll Brev Nml Nml Nml Nml Nml Nml Nml Nml Nml Nml Nml Nml N/A  Right Ext Indicis Nml Nml Nml Nml Nml Nml Nml Nml Nml Nml Nml Nml N/A  Right PronatorTeres Nml Nml Nml Nml Nml Nml Nml Nml Nml Nml Nml Nml N/A  Right Biceps Nml Nml Nml Nml Nml Nml Nml Nml Nml Nml Nml Nml N/A  Right Triceps Nml Nml Nml Nml Nml Nml Nml Nml Nml Nml Nml Nml N/A  Right Deltoid Nml Nml Nml Nml Nml Nml Nml Nml Nml Nml Nml Nml N/A  Right AntTibialis Nml Nml Nml Nml Nml Nml Nml Nml Nml Nml Nml Nml N/A  Right Gastroc Nml Nml Nml Nml Nml Nml Nml Nml Nml Nml Nml Nml N/A  Right Flex Dig Long Nml Nml Nml Nml Nml Nml Nml Nml Nml Nml Nml Nml N/A  Right RectFemoris Nml Nml Nml Nml Nml Nml Nml Nml Nml Nml Nml Nml N/A  Right GluteusMed Nml Nml Nml Nml Nml Nml Nml Nml Nml Nml Nml Nml N/A   RNS   Trial # Label Amp 1 (mV)  O-P Amp 5 (mV)  O-P Amp % Dif Area 1 (mVms) Area 5 (mVms) Area % Dif Rep  Rate Train Length Pause Time (min:sec) Comments  Right AntTibialis  Tr 1 Baseline 3.37 3.19 -5.4 12.18 10.67 -12.4 3.00 10 00:30   Tr 2 Post Exercise 3.51 3.48 -0.7 12.71 11.78 -7.3 3.00 10 01:00   Tr 3 1 Min Post 3.39 3.45 1.7 12.22 11.54 -5.6 3.00 10 01:00   Tr 4 2 Min Post 3.42 3.41 -0.1 11.24 11.27 0.3 3.00 10 01:00   Tr 5 3 Min Post 3.46 3.44 -0.4 12.11 11.47 -5.3 3.00 10 00:00   Right Abd Poll Brev  Tr 1 Baseline 5.91 5.99 1.4 19.03 18.49 -2.8 3.00 10 00:30   Tr 2 Post Exercise 5.87 6.32 7.6 16.96 16.63 -1.9 3.00 10 01:00   Tr 3 1 Min Post 5.93 6.19 4.5 17.60 18.49 5.0 3.00 10 01:00   Tr 4 2 Min Post 5.98 6.26 4.7 18.11 18.66 3.0 3.00 10 01:00   Tr 5 3 Min Post 5.99 5.99 0.1 18.02 17.31 -4.0 3.00 10 00:00   Right Trapezius  Tr 1 Baseline 4.43 4.70 6.1 24.70 24.01 -2.8 3.00 10 00:30   Tr 2 Post Exercise 5.27 5.57 5.7 31.00 29.75 -4.0 3.00 10 01:00   Tr 3 1 Min Post 4.98 3.12 -37.3 28.92 15.26 -47.2 3.00 10 01:00 Movement artifact  Tr 5 2 Min Post 4.95 5.04 1.8 28.51 25.71 -9.8 3.00 10 01:00   Tr 6 3 Min Post 4.77 4.81 0.7 27.58 24.99 -9.4 3.00 10 00:00         Waveforms:

## 2016-07-19 ENCOUNTER — Telehealth: Payer: Self-pay

## 2016-07-19 NOTE — Telephone Encounter (Signed)
Called patient. Gave results. Patient verbalized understanding.    

## 2016-07-19 NOTE — Telephone Encounter (Signed)
-----   Message from Pieter Partridge, DO sent at 07/19/2016  7:40 AM EST ----- Testing did not reveal any muscle disease that would be causing the episodes of double vision.  At this point, I would just continue blood thinners.

## 2016-07-20 ENCOUNTER — Telehealth: Payer: Self-pay | Admitting: Family Medicine

## 2016-07-20 NOTE — Telephone Encounter (Signed)
Pt states she has been having double vision. Pt saw Dr Elease Hashimoto 06/07/16 in Dr Yong Channel absence. Dr Elease Hashimoto sent pt to Dr Tomi Likens, has had MRI, neuro testing and has found nothing to be wrong. Pt states she has an episode yesterday of the room going round and round. Pt would like to know if she needs to see an ophthalmologist now. Pt is concerned it is now having to do with her eyes.  Please advise

## 2016-07-21 NOTE — Telephone Encounter (Signed)
I can see her Monday at 9 30 to reevaluate if she wants. If worsening symptoms could seek care over the weekend.   I also think seeing the eye doctor is reasonable- may refer to optho.

## 2016-07-24 ENCOUNTER — Ambulatory Visit (INDEPENDENT_AMBULATORY_CARE_PROVIDER_SITE_OTHER): Payer: Medicare HMO | Admitting: Family Medicine

## 2016-07-24 ENCOUNTER — Encounter: Payer: Self-pay | Admitting: Family Medicine

## 2016-07-24 VITALS — BP 164/88 | HR 77 | Temp 97.5°F | Ht 65.0 in | Wt 149.0 lb

## 2016-07-24 DIAGNOSIS — H532 Diplopia: Secondary | ICD-10-CM

## 2016-07-24 DIAGNOSIS — E7800 Pure hypercholesterolemia, unspecified: Secondary | ICD-10-CM

## 2016-07-24 DIAGNOSIS — E034 Atrophy of thyroid (acquired): Secondary | ICD-10-CM

## 2016-07-24 LAB — COMPREHENSIVE METABOLIC PANEL
ALBUMIN: 4.5 g/dL (ref 3.5–5.2)
ALK PHOS: 76 U/L (ref 39–117)
ALT: 13 U/L (ref 0–35)
AST: 14 U/L (ref 0–37)
BUN: 19 mg/dL (ref 6–23)
CALCIUM: 10.2 mg/dL (ref 8.4–10.5)
CO2: 29 mEq/L (ref 19–32)
Chloride: 104 mEq/L (ref 96–112)
Creatinine, Ser: 0.83 mg/dL (ref 0.40–1.20)
GFR: 70.76 mL/min (ref >60.00)
Glucose, Bld: 85 mg/dL (ref 70–99)
POTASSIUM: 4.1 meq/L (ref 3.5–5.1)
Sodium: 139 mEq/L (ref 135–145)
TOTAL PROTEIN: 7.3 g/dL (ref 6.0–8.3)
Total Bilirubin: 0.5 mg/dL (ref 0.2–1.2)

## 2016-07-24 LAB — CBC
HEMATOCRIT: 42.6 % (ref 36.0–46.0)
Hemoglobin: 14.1 g/dL (ref 12.0–15.0)
MCHC: 33.1 g/dL (ref 30.0–36.0)
MCV: 88.8 fl (ref 78.0–100.0)
Platelets: 334 10*3/uL (ref 150.0–400.0)
RBC: 4.8 Mil/uL (ref 3.87–5.11)
RDW: 14.4 % (ref 11.5–15.5)
WBC: 7.1 10*3/uL (ref 4.0–10.5)

## 2016-07-24 LAB — LIPID PANEL
CHOLESTEROL: 260 mg/dL — AB (ref 0–200)
HDL: 66.6 mg/dL (ref >39.00)
LDL Cholesterol: 169 mg/dL — ABNORMAL HIGH (ref 0–99)
NonHDL: 193.42
TRIGLYCERIDES: 120 mg/dL (ref 0.0–149.0)
Total CHOL/HDL Ratio: 4
VLDL: 24 mg/dL (ref 0.0–40.0)

## 2016-07-24 LAB — TSH: TSH: 2.67 u[IU]/mL (ref 0.35–4.50)

## 2016-07-24 NOTE — Patient Instructions (Signed)
We will call you within a week or two about your referral to ophthalmology. If you do not hear within 3 weeks, give [place] a call.  I have asked them to see you next week hopefully  If worsening symptoms or new symptoms, please let us know as soon as possible

## 2016-07-24 NOTE — Progress Notes (Signed)
Subjective:  [name] [name] is a 76 y.o. year old very pleasant female patient who presents for/with See problem oriented charting ROS- no headache or blurry vision, 1 episode of vertigo but resolved. No fever or chills or neck stiffness   Past Medical History-  Patient Active Problem List   Diagnosis Date Noted  . CIGARETTE SMOKER 10/09/2007    Priority: High  . Hypothyroidism 10/12/2007    Priority: Medium  . HYPERCHOLESTEROLEMIA 10/08/2007    Priority: Medium  . COPD (chronic obstructive pulmonary disease) ([place]) 10/08/2007    Priority: Medium  . Varicose veins 12/02/2013    Priority: Low  . OA (osteoarthritis) 08/15/2012    Priority: Low  . Hearing loss 08/16/2009    Priority: Low  . BACK PAIN, LUMBAR 10/08/2007    Priority: Low    Medications- reviewed and updated Current Outpatient Prescriptions  Medication Sig Dispense Refill  . acetaminophen (TYLENOL) 650 MG CR tablet Take 650 mg by mouth every 8 (eight) hours as needed for pain.    Marland Kitchen aspirin EC 81 MG EC tablet Take 81 mg by mouth daily.      . Cholecalciferol (VITAMIN D3 PO) Take 200 Units by mouth.    . levothyroxine (SYNTHROID, LEVOTHROID) 75 MCG tablet Take 1 tablet (75 mcg total) by mouth daily. 90 tablet 1  . OMEGA-3 KRILL OIL PO Take by mouth.     No current facility-administered medications for this visit.     Objective: BP (!) 164/88 (BP Location: Left Arm, Patient Position: Sitting, Cuff Size: Normal)   Pulse 77   Temp 97.5 F (36.4 C) (Oral)   Ht '5\' 5"'$  (1.651 m)   Wt 149 lb (67.6 kg)   SpO2 93%   BMI 24.79 kg/m  Gen: NAD, resting comfortably CV: RRR no murmurs rubs or gallops Lungs: CTAB no crackles, wheeze, rhonchi  Ext: no edema Skin: warm, dry Neuro: CN II-XII intact, sensation and reflexes normal throughout, 5/5 muscle strength in bilateral upper and lower extremities. Normal finger to nose. Normal rapid alternating movements. No pronator drift. Normal romberg. Normal gait.    Assessment/Plan:  Binocular vision disorder with diplopia - Plan: Ambulatory referral to Ophthalmology S:11 episodes total at this point. Has always been with wearing glasses. Starting in October. Episodes last 2-3 minutes. If closes one eye can see normal out of the other eye- works with either eye. Had an episode yesterday and was much shorter though. Closed her eyes yesterday and it stopped. Feels slight twitch left eye.   She feels like she sees better without her glasses at a distance and has not had any issues with double vision without having glasses on. A few times after putting her glasses back on to read something has had some symptoms.   Is seeing halos at night. Some blurry lines at outside of vision. Did have an episode of vertigo after neurological testing- none since that time  Extensive workup- September was thought potentially iritis. Given 2 medications and she took one. The next day she took both meds- saw her eye doctor and was told did not have iritis.  Saw dr. Elease Hashimoto in December and set up MR/MRA which was largely normal  Saw neurology Dr. Tomi Likens - "ADDENDUM:  MRA of head revealed no vertebrobasilar stenosis.  Myasthenia gravis panel was negative.  EMG was negative for myasthenia gravis.  No explanation for symptoms at this time.  Would continue antiplatelet therapy." A/P:  Patient with extensive workup as noted abovewith unclear cause  of double vision. She wonders if it is glasses related as she has not had any issues with glasses off. Will Refer to optho at this point. We also discussed neuroopthalmology at [facility] as potential option and would ask for ophalmology opinion on this option.    [facility] in march    We will also update her yearly labs today  Hypothyroidism due to acquired atrophy of thyroid - Plan: TSH  HYPERCHOLESTEROLEMIA - Plan: CBC, Comprehensive metabolic panel, Lipid panel, TSH Orders Placed This Encounter  Procedures  . CBC    [place]  .  Comprehensive metabolic panel    [place]    Order Specific Question:   Has the patient fasted?    Answer:   No  . Lipid panel    [place]    Order Specific Question:   Has the patient fasted?    Answer:   No  . TSH    [place]  . Ambulatory referral to Ophthalmology    Referral Priority:   Routine    Referral Type:   Consultation    Referral Reason:   Specialty Services Required    Requested Specialty:   Ophthalmology    Number of Visits Requested:   1   Return precautions advised.  [name], MD

## 2016-07-24 NOTE — Progress Notes (Signed)
Pre visit review using our clinic review tool, if applicable. No additional management support is needed unless otherwise documented below in the visit note. 

## 2016-07-24 NOTE — Telephone Encounter (Signed)
Pt has been sch for Monday 10:30 AM

## 2016-07-25 DIAGNOSIS — H5052 Exophoria: Secondary | ICD-10-CM | POA: Diagnosis not present

## 2016-07-25 DIAGNOSIS — H04123 Dry eye syndrome of bilateral lacrimal glands: Secondary | ICD-10-CM | POA: Diagnosis not present

## 2016-07-25 DIAGNOSIS — H2513 Age-related nuclear cataract, bilateral: Secondary | ICD-10-CM | POA: Diagnosis not present

## 2016-07-25 NOTE — Telephone Encounter (Signed)
Office notes were faxed over this morning

## 2016-08-21 DIAGNOSIS — Z0100 Encounter for examination of eyes and vision without abnormal findings: Secondary | ICD-10-CM | POA: Diagnosis not present

## 2016-08-21 DIAGNOSIS — H04123 Dry eye syndrome of bilateral lacrimal glands: Secondary | ICD-10-CM | POA: Diagnosis not present

## 2016-08-21 DIAGNOSIS — H2513 Age-related nuclear cataract, bilateral: Secondary | ICD-10-CM | POA: Diagnosis not present

## 2016-08-21 DIAGNOSIS — H5052 Exophoria: Secondary | ICD-10-CM | POA: Diagnosis not present

## 2016-08-28 DIAGNOSIS — M65341 Trigger finger, right ring finger: Secondary | ICD-10-CM | POA: Diagnosis not present

## 2016-08-28 DIAGNOSIS — M65331 Trigger finger, right middle finger: Secondary | ICD-10-CM | POA: Diagnosis not present

## 2016-10-25 DIAGNOSIS — C44629 Squamous cell carcinoma of skin of left upper limb, including shoulder: Secondary | ICD-10-CM | POA: Diagnosis not present

## 2016-11-03 DIAGNOSIS — Z85828 Personal history of other malignant neoplasm of skin: Secondary | ICD-10-CM | POA: Diagnosis not present

## 2016-11-03 DIAGNOSIS — Z08 Encounter for follow-up examination after completed treatment for malignant neoplasm: Secondary | ICD-10-CM | POA: Diagnosis not present

## 2016-11-09 DIAGNOSIS — H5052 Exophoria: Secondary | ICD-10-CM | POA: Diagnosis not present

## 2016-11-09 DIAGNOSIS — H2513 Age-related nuclear cataract, bilateral: Secondary | ICD-10-CM | POA: Diagnosis not present

## 2016-11-09 DIAGNOSIS — H04123 Dry eye syndrome of bilateral lacrimal glands: Secondary | ICD-10-CM | POA: Diagnosis not present

## 2016-11-16 DIAGNOSIS — S6992XD Unspecified injury of left wrist, hand and finger(s), subsequent encounter: Secondary | ICD-10-CM | POA: Diagnosis not present

## 2016-11-16 DIAGNOSIS — R69 Illness, unspecified: Secondary | ICD-10-CM | POA: Diagnosis not present

## 2016-11-16 DIAGNOSIS — E039 Hypothyroidism, unspecified: Secondary | ICD-10-CM | POA: Diagnosis not present

## 2016-11-16 DIAGNOSIS — Z85828 Personal history of other malignant neoplasm of skin: Secondary | ICD-10-CM | POA: Diagnosis not present

## 2016-11-16 DIAGNOSIS — H04129 Dry eye syndrome of unspecified lacrimal gland: Secondary | ICD-10-CM | POA: Diagnosis not present

## 2016-11-16 DIAGNOSIS — Z0000 Encounter for general adult medical examination without abnormal findings: Secondary | ICD-10-CM | POA: Diagnosis not present

## 2016-11-16 DIAGNOSIS — Z79899 Other long term (current) drug therapy: Secondary | ICD-10-CM | POA: Diagnosis not present

## 2016-11-16 DIAGNOSIS — Z6823 Body mass index (BMI) 23.0-23.9, adult: Secondary | ICD-10-CM | POA: Diagnosis not present

## 2016-11-27 DIAGNOSIS — Z01419 Encounter for gynecological examination (general) (routine) without abnormal findings: Secondary | ICD-10-CM | POA: Diagnosis not present

## 2016-11-27 DIAGNOSIS — Z6823 Body mass index (BMI) 23.0-23.9, adult: Secondary | ICD-10-CM | POA: Diagnosis not present

## 2016-11-27 DIAGNOSIS — R351 Nocturia: Secondary | ICD-10-CM | POA: Diagnosis not present

## 2016-11-27 DIAGNOSIS — Z1231 Encounter for screening mammogram for malignant neoplasm of breast: Secondary | ICD-10-CM | POA: Diagnosis not present

## 2016-12-12 ENCOUNTER — Other Ambulatory Visit: Payer: Self-pay | Admitting: Family Medicine

## 2017-01-30 DIAGNOSIS — Z08 Encounter for follow-up examination after completed treatment for malignant neoplasm: Secondary | ICD-10-CM | POA: Diagnosis not present

## 2017-01-30 DIAGNOSIS — Z85828 Personal history of other malignant neoplasm of skin: Secondary | ICD-10-CM | POA: Diagnosis not present

## 2017-03-27 DIAGNOSIS — R69 Illness, unspecified: Secondary | ICD-10-CM | POA: Diagnosis not present

## 2017-04-06 ENCOUNTER — Ambulatory Visit (INDEPENDENT_AMBULATORY_CARE_PROVIDER_SITE_OTHER): Payer: Medicare HMO

## 2017-04-06 ENCOUNTER — Telehealth: Payer: Self-pay | Admitting: Family Medicine

## 2017-04-06 ENCOUNTER — Ambulatory Visit: Payer: Self-pay | Admitting: Family Medicine

## 2017-04-06 ENCOUNTER — Ambulatory Visit (INDEPENDENT_AMBULATORY_CARE_PROVIDER_SITE_OTHER): Payer: Medicare HMO | Admitting: Family Medicine

## 2017-04-06 VITALS — BP 114/58 | HR 94 | Temp 97.9°F | Wt 151.4 lb

## 2017-04-06 DIAGNOSIS — K5900 Constipation, unspecified: Secondary | ICD-10-CM

## 2017-04-06 DIAGNOSIS — R142 Eructation: Secondary | ICD-10-CM

## 2017-04-06 DIAGNOSIS — R0789 Other chest pain: Secondary | ICD-10-CM

## 2017-04-06 DIAGNOSIS — J449 Chronic obstructive pulmonary disease, unspecified: Secondary | ICD-10-CM | POA: Diagnosis not present

## 2017-04-06 LAB — COMPREHENSIVE METABOLIC PANEL
ALT: 21 U/L (ref 0–35)
AST: 21 U/L (ref 0–37)
Albumin: 3.7 g/dL (ref 3.5–5.2)
Alkaline Phosphatase: 87 U/L (ref 39–117)
BUN: 17 mg/dL (ref 6–23)
CO2: 28 mEq/L (ref 19–32)
Calcium: 9.9 mg/dL (ref 8.4–10.5)
Chloride: 95 mEq/L — ABNORMAL LOW (ref 96–112)
Creatinine, Ser: 1 mg/dL (ref 0.40–1.20)
GFR: 56.97 mL/min — ABNORMAL LOW (ref >60.00)
Glucose, Bld: 100 mg/dL — ABNORMAL HIGH (ref 70–99)
Potassium: 3.9 mEq/L (ref 3.5–5.1)
Sodium: 133 mEq/L — ABNORMAL LOW (ref 135–145)
Total Bilirubin: 0.6 mg/dL (ref 0.2–1.2)
Total Protein: 6.6 g/dL (ref 6.0–8.3)

## 2017-04-06 LAB — CBC WITH DIFFERENTIAL/PLATELET
Basophils Absolute: 0.1 10*3/uL (ref 0.0–0.1)
Basophils Relative: 0.9 % (ref 0.0–3.0)
Eosinophils Absolute: 0.1 10*3/uL (ref 0.0–0.7)
Eosinophils Relative: 1.7 % (ref 0.0–5.0)
HCT: 38.6 % (ref 36.0–46.0)
Hemoglobin: 12.7 g/dL (ref 12.0–15.0)
Lymphocytes Relative: 14.7 % (ref 12.0–46.0)
Lymphs Abs: 1.3 10*3/uL (ref 0.7–4.0)
MCHC: 33 g/dL (ref 30.0–36.0)
MCV: 89.2 fl (ref 78.0–100.0)
Monocytes Absolute: 0.9 10*3/uL (ref 0.1–1.0)
Monocytes Relative: 9.5 % (ref 3.0–12.0)
Neutro Abs: 6.6 10*3/uL (ref 1.4–7.7)
Neutrophils Relative %: 73.2 % (ref 43.0–77.0)
Platelets: 369 10*3/uL (ref 150.0–400.0)
RBC: 4.32 Mil/uL (ref 3.87–5.11)
RDW: 13.6 % (ref 11.5–15.5)
WBC: 9 10*3/uL (ref 4.0–10.5)

## 2017-04-06 MED ORDER — POLYETHYLENE GLYCOL 3350 17 GM/SCOOP PO POWD: 17.0000 g | Freq: Every day | ORAL | 1 refills | Status: DC

## 2017-04-06 MED ORDER — OMEPRAZOLE 20 MG PO CPDR: 20.0000 mg | DELAYED_RELEASE_CAPSULE | Freq: Every day | ORAL | 3 refills | Status: DC

## 2017-04-06 NOTE — Telephone Encounter (Signed)
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 at location    Nurse Assessment  Nurse: Verlin Fester, RN, Stanton Kidney Date/Time Eilene Ghazi Time): 04/06/2017 9:02:50 AM  Confirm and document reason for call. If symptomatic, describe symptoms. ---Patient states she is having pain in the left shoulder and a sharp pain in the left breast area when she takes a deep breath. She has been belching and having gas.  Does the patient have any new or worsening symptoms? ---Yes  Will a triage be completed? ---Yes  Related visit to physician within the last 2 weeks? ---No  Does the PT have any chronic conditions? (i.e. diabetes, asthma, etc.) ---No  Is this a behavioral health or substance abuse call? ---No     Guidelines    Guideline Title Affirmed Question Affirmed Notes  Chest Pain Pain also present in shoulder(s) or arm(s) or jaw (Exception: pain is clearly made worse by movement)    Final Disposition User   Go to ED Now Verlin Fester, RN, Stanton Kidney    Comments  After triage patient states she wants to see her own doctor and does not want to go to ED.Marland Kitchen Appointment scheduled for 10 am with Dr. Yong Channel   Referrals  REFERRED TO PCP OFFICE   Caller Disagree/Comply Disagree  Caller Understands Yes  PreDisposition Call Doctor

## 2017-04-06 NOTE — Telephone Encounter (Signed)
Patient currently in office.

## 2017-04-06 NOTE — Telephone Encounter (Signed)
Patient called in stating that she has been having belching, sharp excruciating pain in her left shoulder and generally notfeeling well for the last 4 days. I transferred her to [facility]) for triage.

## 2017-04-06 NOTE — Telephone Encounter (Signed)
Patient was scheduled with Dr. [name] who is out today. Patient is now scheduled with Dr. Briscoe Deutscher this morning at 10:15

## 2017-04-06 NOTE — Telephone Encounter (Signed)
Patient was triaged and scheduled for an in office visit

## 2017-04-06 NOTE — Telephone Encounter (Signed)
Dr. Juleen China concerned and suggests patient is seen in ED. Called patient but unable to reach her. Left voicemail for her to call our office back

## 2017-04-07 NOTE — Progress Notes (Signed)
[name] [name] is a 76 y.o. female here for an acute visit.  History of Present Illness:   HPI:   1. Atypical chest pain. Onset was 1 day ago. Symptoms have been unchanged since that time. The patient's pain is intermittent. The patient describes the pain as dull and radiates to the left midaxilla. Patient rates pain as a 2/10 in intensity. Associated symptoms are: belching. Aggravating factors are: laying down. Alleviating factors are: antacids and position change. Patient's cardiac risk factors are: advanced age (older than 44 for men, 38 for women), dyslipidemia and smoking/ tobacco exposure. Patient's risk factors for DVT/PE: none.    2. Belching. As above. She has tried Pepcid with slight relief.    3. Constipation. Acute on chronic issue. Still passing gas and having small soft bowel movements. Last time was this morning. No NV/D. No melena. No abdominal pain.   PMHx, SurgHx, SocialHx, Medications, and Allergies were reviewed in the Visit Navigator and updated as appropriate.  Current Medications:   .  acetaminophen (TYLENOL) 650 MG CR tablet, Take 650 mg by mouth every 8 (eight) hours as needed for pain., Disp: , Rfl:  .  aspirin EC 81 MG EC tablet, Take 81 mg by mouth daily.  , Disp: , Rfl:  .  Cholecalciferol (VITAMIN D3 PO), Take 200 Units by mouth., Disp: , Rfl:  .  levothyroxine (SYNTHROID, LEVOTHROID) 75 MCG tablet, TAKE 1 TABLET (75 MCG TOTAL) BY MOUTH DAILY., Disp: 90 tablet, Rfl: 1 .  OMEGA-3 KRILL OIL PO, Take by mouth., Disp: , Rfl:   Allergies  Allergen Reactions  . Atorvastatin     Intolerant to all statins   . Ezetimibe      INTOL to Zetia  . Rosuvastatin     intolerant to all statins  . Gadolinium Derivatives Nausea Only    Pt became very nauseous after gadolinium administration//lh  . Nicoderm [Nicotine] Rash    Unable to use the patches---caused severe rash to area placed   Review of Systems:   Pertinent items are noted in the HPI. Otherwise, ROS is  negative.  Vitals:   Vitals:   04/09/17 0720  BP: (!) 114/58  Pulse: 94  Temp: 97.9 F (36.6 C)  TempSrc: Oral  SpO2: 96%  Weight: 151 lb 6.4 oz (68.7 kg)     Body mass index is 25.19 kg/m. Physical Exam:   Physical Exam  Constitutional: She appears well-developed and well-nourished. No distress.  Frequent belching.   HENT:  Head: Normocephalic and atraumatic.  Eyes: Pupils are equal, round, and reactive to light. EOM are normal.  Neck: Normal range of motion. Neck supple.  Cardiovascular: Normal rate, regular rhythm and intact distal pulses.   Pulmonary/Chest: Effort normal and breath sounds normal.  Abdominal: Soft. She exhibits no distension. Bowel sounds are increased. There is no hepatosplenomegaly. There is no tenderness.  Skin: Skin is warm. She is not diaphoretic.  Psychiatric: She has a normal mood and affect. Her behavior is normal.  Nursing note and vitals reviewed.  Results for orders placed or performed in visit on 04/06/17  CBC with Differential/Platelet  Result Value Ref Range   WBC 9.0 4.0 - 10.5 K/uL   RBC 4.32 3.87 - 5.11 Mil/uL   Hemoglobin 12.7 12.0 - 15.0 g/dL   HCT 38.6 36.0 - 46.0 %   MCV 89.2 78.0 - 100.0 fl   MCHC 33.0 30.0 - 36.0 g/dL   RDW 13.6 11.5 - 15.5 %   Platelets  369.0 150.0 - 400.0 K/uL   Neutrophils Relative % 73.2 43.0 - 77.0 %   Lymphocytes Relative 14.7 12.0 - 46.0 %   Monocytes Relative 9.5 3.0 - 12.0 %   Eosinophils Relative 1.7 0.0 - 5.0 %   Basophils Relative 0.9 0.0 - 3.0 %   Neutro Abs 6.6 1.4 - 7.7 K/uL   Lymphs Abs 1.3 0.7 - 4.0 K/uL   Monocytes Absolute 0.9 0.1 - 1.0 K/uL   Eosinophils Absolute 0.1 0.0 - 0.7 K/uL   Basophils Absolute 0.1 0.0 - 0.1 K/uL  Comprehensive metabolic panel  Result Value Ref Range   Sodium 133 (L) 135 - 145 mEq/L   Potassium 3.9 3.5 - 5.1 mEq/L   Chloride 95 (L) 96 - 112 mEq/L   CO2 28 19 - 32 mEq/L   Glucose, Bld 100 (H) 70 - 99 mg/dL   BUN 17 6 - 23 mg/dL   Creatinine, Ser 1.00  0.40 - 1.20 mg/dL   Total Bilirubin 0.6 0.2 - 1.2 mg/dL   Alkaline Phosphatase 87 39 - 117 U/L   AST 21 0 - 37 U/L   ALT 21 0 - 35 U/L   Total Protein 6.6 6.0 - 8.3 g/dL   Albumin 3.7 3.5 - 5.2 g/dL   Calcium 9.9 8.4 - 10.5 mg/dL   GFR 56.97 (L) >60.00 mL/min   EXAM: ABDOMEN - 1 VIEW  FINDINGS: No abnormal bowel dilatation is noted. Moderate amount of stool is seen throughout the colon. No radio-opaque calculi or other significant radiographic abnormality are seen.  IMPRESSION: Moderate stool burden is noted. No evidence of bowel obstruction or Ileus.  EXAM: CHEST  2 VIEW COMPARISON:  02/17/2014  FINDINGS: Cardiac shadow is within normal limits. Stable aortic calcifications are seen. The lungs are hyper aerated bilaterally. Mild interstitial changes are noted stable from the prior exam. No focal infiltrate or sizable effusion is noted. Chronic blunting of left costophrenic angle posteriorly is seen.  IMPRESSION: COPD without acute abnormality.  EKG: normal sinus rhythm.   Assessment and Plan:   Diagnoses and all orders for this visit:  Atypical chest pain Comments: No red flags for cardiac or pulmonary etiology. CXR, EKG as above. Orders: -     EKG 12-Lead -     DG Chest 2 View; Future  Belching Comments: Frequent. With increase bowel sounds and constipation. Large stool burden without ileus on KUB. Bowel regimen reviewed. See AVS.  Orders: -     omeprazole (PRILOSEC) 20 MG capsule; Take 1 capsule (20 mg total) by mouth daily.  Constipation, unspecified constipation type -     DG Abd 1 View -     CBC with Differential/Platelet -     Comprehensive metabolic panel -     polyethylene glycol powder (GLYCOLAX/MIRALAX) powder; Take 17 g by mouth daily.   . Reviewed expectations re: course of current medical issues. . Discussed self-management of symptoms. . Outlined signs and symptoms indicating need for more acute intervention. . Patient verbalized  understanding and all questions were answered. Marland Kitchen Health Maintenance issues including appropriate healthy diet, exercise, and smoking avoidance were discussed with patient. . See orders for this visit as documented in the electronic medical record. . Patient received an After Visit Summary.  Briscoe Deutscher, DO [place], [facility] 04/09/2017

## 2017-04-09 ENCOUNTER — Encounter: Payer: Self-pay | Admitting: Family Medicine

## 2017-04-12 ENCOUNTER — Telehealth: Payer: Self-pay | Admitting: Family Medicine

## 2017-04-12 NOTE — Telephone Encounter (Signed)
Patient calling to state that she was still not feeling better from previous visit. Would like to speak with provider or her nurse when possible.

## 2017-04-13 ENCOUNTER — Ambulatory Visit (INDEPENDENT_AMBULATORY_CARE_PROVIDER_SITE_OTHER): Payer: Medicare HMO | Admitting: Family Medicine

## 2017-04-13 ENCOUNTER — Encounter: Payer: Self-pay | Admitting: Family Medicine

## 2017-04-13 VITALS — BP 122/84 | HR 88 | Temp 98.2°F | Ht 65.0 in | Wt 152.2 lb

## 2017-04-13 DIAGNOSIS — R142 Eructation: Secondary | ICD-10-CM

## 2017-04-13 DIAGNOSIS — K5900 Constipation, unspecified: Secondary | ICD-10-CM | POA: Diagnosis not present

## 2017-04-13 DIAGNOSIS — J029 Acute pharyngitis, unspecified: Secondary | ICD-10-CM | POA: Diagnosis not present

## 2017-04-13 LAB — POCT RAPID STREP A (OFFICE): Rapid Strep A Screen: NEGATIVE

## 2017-04-13 NOTE — Patient Instructions (Signed)
Strep test negative  I still think constipation is the likely cause of left side pain- improving on treatment. Continue miralax mixture daily- skip a day if you have very loose stool  Continue reflux medicine as well  Lets follow up in 7-10 days. See [place] back sooner for new or worsening symptoms particularly fever,vomiting or shortness of breath

## 2017-04-13 NOTE — Progress Notes (Signed)
Subjective:  [name] [name] is a 76 y.o. year old very pleasant female patient who presents for/with See problem oriented charting ROS- admits to belching (improved) abdominal pain and mid back pain (improved). No vomiting. Denies chest pain.    Past Medical History-  Patient Active Problem List   Diagnosis Date Noted  . CIGARETTE SMOKER 10/09/2007    Priority: High  . Hypothyroidism 10/12/2007    Priority: Medium  . HYPERCHOLESTEROLEMIA 10/08/2007    Priority: Medium  . COPD (chronic obstructive pulmonary disease) ([place]) 10/08/2007    Priority: Medium  . Varicose veins 12/02/2013    Priority: Low  . OA (osteoarthritis) 08/15/2012    Priority: Low  . Hearing loss 08/16/2009    Priority: Low  . BACK PAIN, LUMBAR 10/08/2007    Priority: Low    Medications- reviewed and updated Current Outpatient Prescriptions  Medication Sig Dispense Refill  . acetaminophen (TYLENOL) 650 MG CR tablet Take 650 mg by mouth every 8 (eight) hours as needed for pain.    Marland Kitchen aspirin EC 81 MG EC tablet Take 81 mg by mouth daily.      . Cholecalciferol (VITAMIN D3 PO) Take 200 Units by mouth.    . levothyroxine (SYNTHROID, LEVOTHROID) 75 MCG tablet TAKE 1 TABLET (75 MCG TOTAL) BY MOUTH DAILY. 90 tablet 1  . OMEGA-3 KRILL OIL PO Take by mouth.    Marland Kitchen omeprazole (PRILOSEC) 20 MG capsule Take 1 capsule (20 mg total) by mouth daily. 30 capsule 3  . polyethylene glycol powder (GLYCOLAX/MIRALAX) powder Take 17 g by mouth daily. 3350 g 1   No current facility-administered medications for this visit.     Objective: BP 122/84 (BP Location: Left Arm, Patient Position: Sitting, Cuff Size: Normal)   Pulse 88   Temp 98.2 F (36.8 C) (Oral)   Ht 5\' 5"  (1.651 m)   Wt 152 lb 3.2 oz (69 kg)   SpO2 93%   BMI 25.33 kg/m  Gen: NAD, resting comfortably, smells of smoke Pharynx erythematous particularly on the left-mild amount of purulent material.  No tonsillar hypertrophy CV: RRR no murmurs rubs or gallops Lungs:  CTAB no crackles, wheeze, rhonchi MSK: Pain essentially along left side of bra strap.  At times at least moderate tenderness  abdomen: soft/intermittently diffusely tender but sometimes with distraction no longer tender-more tender on the left side/nondistended/normal bowel sounds. No rebound or guarding.  Ext: no edema Skin: warm, dry, a few mildly erythematous macules below the left bra strap  Assessment/Plan:  Sore throat - Plan: POCT rapid strep A  Belching  Constipation, unspecified constipation type S: last week was seen with pain in her left flank- Small bowel movements. No nausea or vomiting. Some abdominal pain. X-ray showed stool burden moderate. X-ray chest showed no acute abnormality.   She has been compliant with taking prilosec each morning. Has taken miralax everyday- bigger BMs now but floating.   Pain has gotten better but still cannot lay on that side- still tender all through left back- almost in a band around bra around back and into chest.  Belching a fair amount but improved.  She has made slow improvement each day over last week.   Sore throat every night for a week along with chills. Does not feel well overall-feels run down.  Also took advil twice this week for trigger finger. Had trigger finger injection x1 and then has  A/P: Her abdominal symptoms (belching, pain around left side) have improved with prilosec and miralax- we  opted to continue this and follow up in 1 week to 10 days. With band like distribution considered shingles but really no significant rash and this far into symptoms would have suspected this would have developed.   With her feeling run down and having sore throat- opted to get strep test which was negative. Did have erythema in pharynx and some purulence- may have viral pharyngitis.   Future Appointments Date Time Provider [place]  04/20/2017 1:15 PM Marin Olp, MD [facility] None   Orders Placed This Encounter  Procedures  .  POCT rapid strep A   Return precautions advised.  [name], MD

## 2017-04-13 NOTE — Telephone Encounter (Signed)
Called and spoke with patient. She stated that she is still having a lot of belching since her visit from last week. She is also having a lot of soreness on her left side and has to sleep on her back. She also has developed a sore throat and was wondering if this could be coming from the belching. Dr. Yong Channel has an opening today at 4 so I put on his schedule.

## 2017-04-20 ENCOUNTER — Ambulatory Visit (INDEPENDENT_AMBULATORY_CARE_PROVIDER_SITE_OTHER): Payer: Medicare HMO | Admitting: Family Medicine

## 2017-04-20 VITALS — BP 128/78 | HR 83 | Temp 98.0°F | Resp 16 | Wt 152.0 lb

## 2017-04-20 DIAGNOSIS — M720 Palmar fascial fibromatosis [Dupuytren]: Secondary | ICD-10-CM | POA: Diagnosis not present

## 2017-04-20 DIAGNOSIS — K5900 Constipation, unspecified: Secondary | ICD-10-CM

## 2017-04-20 DIAGNOSIS — R030 Elevated blood-pressure reading, without diagnosis of hypertension: Secondary | ICD-10-CM | POA: Diagnosis not present

## 2017-04-20 DIAGNOSIS — R142 Eructation: Secondary | ICD-10-CM | POA: Diagnosis not present

## 2017-04-20 MED ORDER — PREDNISONE 20 MG PO TABS: ORAL_TABLET | ORAL | 0 refills | Status: DC

## 2017-04-20 NOTE — Patient Instructions (Addendum)
continue prilosec and miralax until resolved. If symptoms more than 2 more months return to see [place]  Trial prednisone for 7 days. Start tomorrow morning  Glad you have appointment with orthopedics next week

## 2017-04-20 NOTE — Progress Notes (Signed)
Subjective:  [name] [name] is a 76 y.o. year old very pleasant female patient who presents for/with See problem oriented charting ROS- denies current chest pain or shortness of breath, still belching, left flank pain much improved   Past Medical History-  Patient Active Problem List   Diagnosis Date Noted  . CIGARETTE SMOKER 10/09/2007    Priority: High  . Hypothyroidism 10/12/2007    Priority: Medium  . HYPERCHOLESTEROLEMIA 10/08/2007    Priority: Medium  . COPD (chronic obstructive pulmonary disease) ([place]) 10/08/2007    Priority: Medium  . Varicose veins 12/02/2013    Priority: Low  . OA (osteoarthritis) 08/15/2012    Priority: Low  . Hearing loss 08/16/2009    Priority: Low  . BACK PAIN, LUMBAR 10/08/2007    Priority: Low    Medications- reviewed and updated Current Outpatient Prescriptions  Medication Sig Dispense Refill  . acetaminophen (TYLENOL) 650 MG CR tablet Take 650 mg by mouth every 8 (eight) hours as needed for pain.    Marland Kitchen aspirin EC 81 MG EC tablet Take 81 mg by mouth daily.      . Cholecalciferol (VITAMIN D3 PO) Take 200 Units by mouth.    . levothyroxine (SYNTHROID, LEVOTHROID) 75 MCG tablet TAKE 1 TABLET (75 MCG TOTAL) BY MOUTH DAILY. 90 tablet 1  . OMEGA-3 KRILL OIL PO Take by mouth.    Marland Kitchen omeprazole (PRILOSEC) 20 MG capsule Take 1 capsule (20 mg total) by mouth daily. 30 capsule 3  . polyethylene glycol powder (GLYCOLAX/MIRALAX) powder Take 17 g by mouth daily. 3350 g 1  . predniSONE (DELTASONE) 20 MG tablet Take 1 tablet by mouth daily for 5 days, then 1/2 tablet daily for 2 days 6 tablet 0   No current facility-administered medications for this visit.     Objective: BP 128/78   Pulse 83   Temp 98 F (36.7 C) (Oral)   Resp 16   Wt 152 lb (68.9 kg)   SpO2 97%   BMI 25.29 kg/m  Gen: NAD, resting comfortably CV: RRR  Lungs: nonlabored Ext: no edema Skin: warm, dry MSK: dupuytrens contracture palm of right hand with pain with  palpation  Assessment/Plan:  Sore, throat, belching,  left flank pain, constipation S:  Patient with continued improvement in symptoms including belching, pain around left side. She is on prilosec as well as miralax- thought to be combo of constipation as well as GERD. Prior had x-ray chest and abdomen- only finding really was constipation. Side pain far better and able to lay on her left side now. Worse with diet drinks. She had sore throat last visit and negative strep test. Was also dealing with some fatigue. Still taking miralax and getting intermittent BMs (not floating anymore and more solid now) but still not always daily. Prilosec still helping A/P: much improved- advised to continue prilosec and miralax until resolved. If symptoms more than 2 more months return to see [place]  Trigger finger dupuytren's contracture S: back before Christmas had shot for trigger finger in right hand with Dr. Caralyn Guile- did not tolerate shot well and only helped for 2 weeks. Then went back for 3rd finger- has duputren's contracture there and had another injection- that helped more. Was told would need surgery.   Has an appointment with Dr. Caralyn Guile next Friday. Tylenol, ibuprofen have not helped with pain  A/P: Apparently patient had tried to get in with the hand surgery group in town and not had success-I encouraged her to follow through with  Dr. Caralyn Guile- she asks about pain control until visit and I agree to trial course of prednisone for antiinflammatory properties  Blood pressure elevation transient S: controlled on repeat.  BP Readings from Last 3 Encounters:  04/20/17 (!) 182/80-->128/78 patient had been waiting over an hour as not checked in up front by office staff- felt better once seen  04/13/17 122/84  04/09/17 (!) 114/58  A/P: We discussed blood pressure goal of <140/90. Continue without mediation  Meds ordered this encounter  Medications  . predniSONE (DELTASONE) 20 MG tablet    Sig: Take 1  tablet by mouth daily for 5 days, then 1/2 tablet daily for 2 days    Dispense:  6 tablet    Refill:  0   Return precautions advised.  [name], MD

## 2017-04-21 ENCOUNTER — Encounter: Payer: Self-pay | Admitting: Family Medicine

## 2017-04-23 ENCOUNTER — Other Ambulatory Visit: Payer: Self-pay

## 2017-04-23 MED ORDER — LEVOTHYROXINE SODIUM 75 MCG PO TABS: 75.0000 ug | ORAL_TABLET | Freq: Every day | ORAL | 1 refills | Status: DC

## 2017-04-27 DIAGNOSIS — M65341 Trigger finger, right ring finger: Secondary | ICD-10-CM | POA: Diagnosis not present

## 2017-04-27 DIAGNOSIS — M65331 Trigger finger, right middle finger: Secondary | ICD-10-CM | POA: Diagnosis not present

## 2017-05-23 DIAGNOSIS — M65331 Trigger finger, right middle finger: Secondary | ICD-10-CM | POA: Diagnosis not present

## 2017-05-23 DIAGNOSIS — M720 Palmar fascial fibromatosis [Dupuytren]: Secondary | ICD-10-CM | POA: Diagnosis not present

## 2017-05-23 DIAGNOSIS — M65341 Trigger finger, right ring finger: Secondary | ICD-10-CM | POA: Diagnosis not present

## 2017-06-04 DIAGNOSIS — M65331 Trigger finger, right middle finger: Secondary | ICD-10-CM | POA: Diagnosis not present

## 2017-07-04 ENCOUNTER — Telehealth: Payer: Self-pay | Admitting: Family Medicine

## 2017-07-04 MED ORDER — LEVOTHYROXINE SODIUM 75 MCG PO TABS: 75.0000 ug | ORAL_TABLET | Freq: Every day | ORAL | 0 refills | Status: DC

## 2017-07-04 NOTE — Telephone Encounter (Signed)
Copied from [place] [phone]. Topic: Quick Communication - See Telephone Encounter >> Jul 04, 2017 10:37 AM Ether Griffins B wrote: CRM for notification. See Telephone encounter for:  Pt has changed pharmacies (updated in chart ) and needs a refill on levothyroxine sent to [facility]  07/04/17.

## 2017-07-20 DIAGNOSIS — M65331 Trigger finger, right middle finger: Secondary | ICD-10-CM | POA: Insufficient documentation

## 2017-08-21 DIAGNOSIS — H04123 Dry eye syndrome of bilateral lacrimal glands: Secondary | ICD-10-CM | POA: Diagnosis not present

## 2017-08-21 DIAGNOSIS — H2513 Age-related nuclear cataract, bilateral: Secondary | ICD-10-CM | POA: Diagnosis not present

## 2017-08-21 DIAGNOSIS — H5052 Exophoria: Secondary | ICD-10-CM | POA: Diagnosis not present

## 2017-08-23 DIAGNOSIS — M65341 Trigger finger, right ring finger: Secondary | ICD-10-CM | POA: Diagnosis not present

## 2017-08-23 DIAGNOSIS — M65331 Trigger finger, right middle finger: Secondary | ICD-10-CM | POA: Diagnosis not present

## 2017-09-28 ENCOUNTER — Other Ambulatory Visit: Payer: Self-pay | Admitting: Family Medicine

## 2017-10-02 ENCOUNTER — Telehealth: Payer: Self-pay | Admitting: Family Medicine

## 2017-10-02 ENCOUNTER — Other Ambulatory Visit: Payer: Self-pay

## 2017-10-02 DIAGNOSIS — M65331 Trigger finger, right middle finger: Secondary | ICD-10-CM | POA: Diagnosis not present

## 2017-10-02 DIAGNOSIS — M65341 Trigger finger, right ring finger: Secondary | ICD-10-CM | POA: Diagnosis not present

## 2017-10-02 NOTE — Telephone Encounter (Signed)
Copied from [place] [id number]. Topic: General - Other >> Oct 02, 2017 12:09 PM Darl Householder, RMA wrote: Reason for CRM: Patient is requesting a call back concerning medication levothyroxine (SYNTHROID, LEVOTHROID) 75 MCG tablet to a different brand, please return pt call

## 2017-10-02 NOTE — Telephone Encounter (Signed)
See note

## 2017-10-08 DIAGNOSIS — M25641 Stiffness of right hand, not elsewhere classified: Secondary | ICD-10-CM | POA: Diagnosis not present

## 2017-10-08 NOTE — Telephone Encounter (Signed)
Called and spoke with patient who stated she wanted the oblong shaped pills versus the little round ones. She asked that I call the pharmacy and let them know that she wants the oblong ones. I called the pharmacy who stated that all they have is little round ones. I called the patient and let her know that. She verbalized understanding and stated that [facility] has the oblong ones and the cost is cheaper than [facility]. I advised her if she decides to switch to let us know.

## 2017-10-12 DIAGNOSIS — M25641 Stiffness of right hand, not elsewhere classified: Secondary | ICD-10-CM | POA: Diagnosis not present

## 2017-10-16 DIAGNOSIS — M25641 Stiffness of right hand, not elsewhere classified: Secondary | ICD-10-CM | POA: Diagnosis not present

## 2017-10-18 DIAGNOSIS — M25641 Stiffness of right hand, not elsewhere classified: Secondary | ICD-10-CM | POA: Diagnosis not present

## 2017-10-24 DIAGNOSIS — M25641 Stiffness of right hand, not elsewhere classified: Secondary | ICD-10-CM | POA: Diagnosis not present

## 2017-10-29 DIAGNOSIS — M79641 Pain in right hand: Secondary | ICD-10-CM | POA: Diagnosis not present

## 2017-10-29 DIAGNOSIS — M65331 Trigger finger, right middle finger: Secondary | ICD-10-CM | POA: Diagnosis not present

## 2017-10-29 DIAGNOSIS — M65341 Trigger finger, right ring finger: Secondary | ICD-10-CM | POA: Diagnosis not present

## 2017-11-09 DIAGNOSIS — D485 Neoplasm of uncertain behavior of skin: Secondary | ICD-10-CM | POA: Diagnosis not present

## 2017-11-09 DIAGNOSIS — L820 Inflamed seborrheic keratosis: Secondary | ICD-10-CM | POA: Diagnosis not present

## 2017-11-09 DIAGNOSIS — Z08 Encounter for follow-up examination after completed treatment for malignant neoplasm: Secondary | ICD-10-CM | POA: Diagnosis not present

## 2017-11-09 DIAGNOSIS — Z85828 Personal history of other malignant neoplasm of skin: Secondary | ICD-10-CM | POA: Diagnosis not present

## 2017-12-11 DIAGNOSIS — Z1231 Encounter for screening mammogram for malignant neoplasm of breast: Secondary | ICD-10-CM | POA: Diagnosis not present

## 2017-12-11 DIAGNOSIS — N760 Acute vaginitis: Secondary | ICD-10-CM | POA: Diagnosis not present

## 2017-12-11 DIAGNOSIS — Z01419 Encounter for gynecological examination (general) (routine) without abnormal findings: Secondary | ICD-10-CM | POA: Diagnosis not present

## 2017-12-11 DIAGNOSIS — Z6824 Body mass index (BMI) 24.0-24.9, adult: Secondary | ICD-10-CM | POA: Diagnosis not present

## 2017-12-11 LAB — HM PAP SMEAR

## 2017-12-11 LAB — HM MAMMOGRAPHY

## 2018-01-28 ENCOUNTER — Encounter: Payer: Self-pay | Admitting: Physician Assistant

## 2018-01-28 ENCOUNTER — Ambulatory Visit: Payer: Medicare Other | Admitting: Physician Assistant

## 2018-01-28 VITALS — BP 124/74 | HR 66 | Temp 98.7°F | Ht 65.0 in | Wt 149.4 lb

## 2018-01-28 DIAGNOSIS — R1084 Generalized abdominal pain: Secondary | ICD-10-CM

## 2018-01-28 DIAGNOSIS — Z87440 Personal history of urinary (tract) infections: Secondary | ICD-10-CM | POA: Diagnosis not present

## 2018-01-28 DIAGNOSIS — R142 Eructation: Secondary | ICD-10-CM

## 2018-01-28 DIAGNOSIS — K5900 Constipation, unspecified: Secondary | ICD-10-CM

## 2018-01-28 DIAGNOSIS — R079 Chest pain, unspecified: Secondary | ICD-10-CM | POA: Diagnosis not present

## 2018-01-28 LAB — URINALYSIS, ROUTINE W REFLEX MICROSCOPIC
Bilirubin Urine: NEGATIVE
HGB URINE DIPSTICK: NEGATIVE
KETONES UR: NEGATIVE
Nitrite: NEGATIVE
RBC / HPF: NONE SEEN (ref 0–?)
SPECIFIC GRAVITY, URINE: 1.01 (ref 1.000–1.030)
TOTAL PROTEIN, URINE-UPE24: NEGATIVE
URINE GLUCOSE: NEGATIVE
Urobilinogen, UA: 0.2 (ref 0.0–1.0)
pH: 7 (ref 5.0–8.0)

## 2018-01-28 MED ORDER — OMEPRAZOLE 20 MG PO CPDR: 20.0000 mg | DELAYED_RELEASE_CAPSULE | Freq: Every day | ORAL | 3 refills | Status: DC

## 2018-01-28 MED ORDER — POLYETHYLENE GLYCOL 3350 17 GM/SCOOP PO POWD: 17.0000 g | Freq: Every day | ORAL | 1 refills | Status: DC

## 2018-01-28 NOTE — Patient Instructions (Addendum)
It was great to see you!  If you develop any chest pain, shortness of breath or other changes in symptoms please go to the Emergency Room.  Start 1 capful of Miralax daily, take in beverage of your choice. Goal is to have 1 soft bowel movement daily.  Take the omeprazole daily until you see Dr. Yong Channel in 1 month.  Please see Dr. Yong Channel in 1 month, sooner if you have concerns.  Take care,  Inda Coke PA-C

## 2018-01-28 NOTE — Progress Notes (Signed)
[name] [name] is a 77 y.o. female here for a new problem.  History of Present Illness:   Chief Complaint  Patient presents with  . Acute Visit    HPI   Patient presents with multiple complaints.  UTI Was having urinary frequency when she went to her ob-gyn in June. UA showed UTI. This was her first UTI and she was concerned because she has never had a UTI. She was prescribed Macrobid and took as directed. She wants to make sure that her UTI is gone and that she doesn't have a kidney infection. She currently denies dysuria, blood in urine. Does continue to have frequency. Denies nausea.  We received her records from [facility] regarding her UA --> Shows TNTC bacteria with trace leuks on 12/11/17.  Belching and Constipation Has been without the Miralax regularly, she was prescribed this by Dr. Juleen China in Oct 2019 when work-up revealed moderate stool burden -- she states that she only took this for a few days. Having small balls of stool whenever she has a BM, this has been going on for at least one month. Bought probiotic gummies - took once and did not provide relief so she stopped taking them. She has been having an increase in her belching as well. This has been successfully treated in the past with Omeprazole, has taken for up to two months, last took in 2018. Denies blood in stool, concern for hemorrhoids. Does have history of diverticulosis, denies hx of diverticulitis.  Chest pain Endorses intermittent chest pain. She continues to smoke. Pain is not worse with activity. She feels like it is related to her belching, but she is not sure. Denies SOB.  Past Medical History:  Diagnosis Date  . Asymptomatic varicose veins   . Cataract    beginnings  . Cigarette smoker   . COPD (chronic obstructive pulmonary disease) ([place])   . Diverticulosis of colon (without mention of hemorrhage)   . Headache(784.0)   . Hearing loss    wears hearing aides  . Lumbar back pain   . Pure  hypercholesterolemia   . Stress disorder, acute   . Varicose veins      Social History   Socioeconomic History  . Marital status: Married    Spouse name: Not on file  . Number of children: 2  . Years of education: Not on file  . Highest education level: Not on file  Occupational History  . Occupation: retired    Fish farm manager: RETIRED  Social Needs  . Financial resource strain: Not on file  . Food insecurity:    Worry: Not on file    Inability: Not on file  . Transportation needs:    Medical: Not on file    Non-medical: Not on file  Tobacco Use  . Smoking status: Current Every Day Smoker    Packs/day: 0.50    Years: 40.00    Pack years: 20.00    Types: Cigarettes  . Smokeless tobacco: Never Used  . Tobacco comment: 1/2-3/4 of a pk a day   Substance and Sexual Activity  . Alcohol use: No    Alcohol/week: 0.0 standard drinks  . Drug use: No  . Sexual activity: Not on file  Lifestyle  . Physical activity:    Days per week: Not on file    Minutes per session: Not on file  . Stress: Not on file  Relationships  . Social connections:    Talks on phone: Not on file  Gets together: Not on file    Attends religious service: Not on file    Active member of club or organization: Not on file    Attends meetings of clubs or organizations: Not on file    Relationship status: Not on file  . Intimate partner violence:    Fear of current or ex partner: Not on file    Emotionally abused: Not on file    Physically abused: Not on file    Forced sexual activity: Not on file  Other Topics Concern  . Not on file  Social History Narrative   Married 2008 (3rd marriage). 2 sons by first husband. 3 grandkids.       Retired [employer] for 40 years      Hobbies: Designer, multimedia games, go to casino ([place]    Past Surgical History:  Procedure Laterality Date  . COLONOSCOPY  04-22-2004  . LUMBAR SPINE SURGERY     [phone]  . varicose vein treatment     at [place]  [facility]    Family History  Problem Relation Age of Onset  . Diabetes Father   . Heart attack Mother 32       nonsmoker  . Colon cancer Neg Hx   . Rectal cancer Neg Hx   . Stomach cancer Neg Hx     Allergies  Allergen Reactions  . Atorvastatin     Intolerant to all statins   . Ezetimibe      INTOL to Zetia  . Rosuvastatin     intolerant to all statins  . Gadolinium Derivatives Nausea Only    Pt became very nauseous after gadolinium administration//lh  . Nicoderm [Nicotine] Rash    Unable to use the patches---caused severe rash to area placed    Current Medications:   Current Outpatient Medications:  .  acetaminophen (TYLENOL) 650 MG CR tablet, Take 650 mg by mouth every 8 (eight) hours as needed for pain., Disp: , Rfl:  .  aspirin EC 81 MG EC tablet, Take 81 mg by mouth daily.  , Disp: , Rfl:  .  Cholecalciferol (VITAMIN D3 PO), Take 200 Units by mouth., Disp: , Rfl:  .  levothyroxine (SYNTHROID, LEVOTHROID) 75 MCG tablet, TAKE 1 TABLET(75 MCG) BY MOUTH DAILY, Disp: 90 tablet, Rfl: 1 .  OMEGA-3 KRILL OIL PO, Take by mouth., Disp: , Rfl:  .  omeprazole (PRILOSEC) 20 MG capsule, Take 1 capsule (20 mg total) by mouth daily., Disp: 30 capsule, Rfl: 3 .  polyethylene glycol powder (GLYCOLAX/MIRALAX) powder, Take 17 g by mouth daily., Disp: 3350 g, Rfl: 1 .  predniSONE (DELTASONE) 20 MG tablet, Take 1 tablet by mouth daily for 5 days, then 1/2 tablet daily for 2 days (Patient not taking: Reported on 01/28/2018), Disp: 6 tablet, Rfl: 0   Review of Systems:   ROS  Negative unless otherwise specified per HPI.  Vitals:   Vitals:   01/28/18 1011  BP: 124/74  Pulse: 66  Temp: 98.7 F (37.1 C)  TempSrc: Oral  SpO2: 100%  Weight: 149 lb 6.4 oz (67.8 kg)  Height: 5\' 5"  (1.651 m)     Body mass index is 24.86 kg/m.  Physical Exam:   Physical Exam  Constitutional: She appears well-developed. She is cooperative.  Non-toxic appearance. She does not have a sickly  appearance. She does not appear ill. No distress.  Cardiovascular: Normal rate, regular rhythm, S1 normal, S2 normal, normal heart sounds and normal pulses.  No LE edema  Pulmonary/Chest: Effort normal and breath sounds normal.  Abdominal: Normal appearance and bowel sounds are normal. There is generalized tenderness and tenderness in the right lower quadrant and left lower quadrant. There is no rigidity, no rebound, no guarding, no CVA tenderness, no tenderness at McBurney's point and negative Murphy's sign.  Neurological: She is alert. GCS eye subscore is 4. GCS verbal subscore is 5. GCS motor subscore is 6.  Skin: Skin is warm, dry and intact.  Psychiatric: She has a normal mood and affect. Her speech is normal and behavior is normal.  Nursing note and vitals reviewed.  EKG tracing is personally reviewed.  EKG notes NSR.  No acute changes. Reviewed with PCP Dr. [name].  Assessment and Plan:    Paquita was seen today for acute visit.  Diagnoses and all orders for this visit:  Chest pain, unspecified type EKG tracing is personally reviewed.  EKG notes NSR.  No acute changes. Reviewed with Dr. [name]. Suspect related to dyspepsia. Recommended follow-up with PCP in 1 month, sooner if needed. I discussed with her that if her symptoms change in any way she should go to the ER. -     EKG 12-Lead  Constipation, unspecified constipation type; Generalized abdominal pain; Belching States that she had almost a complete resolution of symptoms when she was on Omeprazole and Miralax daily. Provided handout of improving bowel regimen. I recommended that she start Omeprazole and take daily until f/u with PCP in 1 month. Discussed taking 1 capful of Miralax daily until symptoms improve with goal of 1 soft BM daily. -     polyethylene glycol powder (GLYCOLAX/MIRALAX) powder; Take 17 g by mouth daily.  History of UTI Re-check UA today. -     Urinalysis, Routine w reflex microscopic -      Urine Culture  . Reviewed expectations re: course of current medical issues. . Discussed self-management of symptoms. . Outlined signs and symptoms indicating need for more acute intervention. . Patient verbalized understanding and all questions were answered. . See orders for this visit as documented in the electronic medical record. . Patient received an After-Visit Summary.   Inda Coke, PA-C

## 2018-01-29 ENCOUNTER — Other Ambulatory Visit: Payer: Self-pay | Admitting: Physician Assistant

## 2018-01-29 DIAGNOSIS — M65341 Trigger finger, right ring finger: Secondary | ICD-10-CM | POA: Diagnosis not present

## 2018-01-29 DIAGNOSIS — M65331 Trigger finger, right middle finger: Secondary | ICD-10-CM | POA: Diagnosis not present

## 2018-01-29 DIAGNOSIS — M720 Palmar fascial fibromatosis [Dupuytren]: Secondary | ICD-10-CM | POA: Diagnosis not present

## 2018-01-29 LAB — URINE CULTURE
MICRO NUMBER:: 90953020
Result:: NO GROWTH
SPECIMEN QUALITY:: ADEQUATE

## 2018-01-29 MED ORDER — CEPHALEXIN 500 MG PO CAPS: 500.0000 mg | ORAL_CAPSULE | Freq: Three times a day (TID) | ORAL | 0 refills | Status: DC

## 2018-01-30 ENCOUNTER — Encounter: Payer: Self-pay | Admitting: Family Medicine

## 2018-02-01 ENCOUNTER — Ambulatory Visit (INDEPENDENT_AMBULATORY_CARE_PROVIDER_SITE_OTHER): Payer: Medicare Other

## 2018-02-01 ENCOUNTER — Ambulatory Visit: Payer: Medicare Other | Admitting: Family Medicine

## 2018-02-01 ENCOUNTER — Encounter: Payer: Self-pay | Admitting: Family Medicine

## 2018-02-01 ENCOUNTER — Ambulatory Visit: Payer: Medicare Other | Admitting: Physician Assistant

## 2018-02-01 ENCOUNTER — Ambulatory Visit: Payer: Self-pay | Admitting: *Deleted

## 2018-02-01 VITALS — BP 130/78 | HR 76 | Temp 98.0°F | Ht 65.0 in | Wt 149.0 lb

## 2018-02-01 DIAGNOSIS — R142 Eructation: Secondary | ICD-10-CM | POA: Diagnosis not present

## 2018-02-01 DIAGNOSIS — M150 Primary generalized (osteo)arthritis: Secondary | ICD-10-CM

## 2018-02-01 DIAGNOSIS — M549 Dorsalgia, unspecified: Secondary | ICD-10-CM

## 2018-02-01 DIAGNOSIS — R1031 Right lower quadrant pain: Secondary | ICD-10-CM | POA: Diagnosis not present

## 2018-02-01 DIAGNOSIS — J449 Chronic obstructive pulmonary disease, unspecified: Secondary | ICD-10-CM | POA: Diagnosis not present

## 2018-02-01 DIAGNOSIS — M546 Pain in thoracic spine: Secondary | ICD-10-CM | POA: Diagnosis not present

## 2018-02-01 DIAGNOSIS — M1611 Unilateral primary osteoarthritis, right hip: Secondary | ICD-10-CM | POA: Diagnosis not present

## 2018-02-01 DIAGNOSIS — M159 Polyosteoarthritis, unspecified: Secondary | ICD-10-CM

## 2018-02-01 MED ORDER — METHYLPREDNISOLONE ACETATE 40 MG/ML IJ SUSP
40.0000 mg | Freq: Once | INTRAMUSCULAR | Status: AC
  Administered 2018-02-01: 40 mg via INTRAMUSCULAR

## 2018-02-01 MED ORDER — TRAMADOL HCL 50 MG PO TABS: 25.0000 mg | ORAL_TABLET | Freq: Four times a day (QID) | ORAL | 0 refills | Status: DC | PRN

## 2018-02-01 NOTE — Assessment & Plan Note (Addendum)
Right hip pain Mid to Upper back pain radiating to the left S:  didn't sleep at all last upper back going up across left mid back- she has had a flare up in pain last few days. Starts up when she lays down in the bed. Tried a firmer setting on sleep number bed to try to get comfortable and slept for an hour and it didnt seem to help after that  Also hurts in right groin- hard to walk for about a week. Wonders about gas- not having bowe movements as frequently. Aleve and advil tried- advised against given potential GERD. Tylenol only advised A/P: Got x-rays of thoracic spine given this is keeping her from sleep. On my view of films - looks like likely has thoracic OA which could be causing her symptoms. Also with the hip appears to have arthritis on R. She asks for steroid injection which was given IM today. We are going to follow up in a month to recheck. If she fails to improve in 2 weeks can get [facility] into [facility] per her preference or Dr. Paulla Fore. Discussed if new or worsening symptoms return to see [place]. Avoid nsaids- tylenol only. Given smoking history could get x-ray at follow up if pain in upper back not improving  Given severity of pain- did give her a 5 day supply of tramadol to give the steroid some time to kick in

## 2018-02-01 NOTE — Assessment & Plan Note (Signed)
No wheezing on exam- doubt current copd exacerbation

## 2018-02-01 NOTE — Telephone Encounter (Signed)
Pt having upper back pain, from top of where the her bra comes across to top of  her shoulders. She has been belching a lot. That does not seem to ease the discomfort she is having. She is not able to rest at night, can not sleep. The pain usually goes away after been going away around 5 or 6 in the morning after being up in the recliner. She is requesting for pain med or med to help her rest. Denies numbness or tingling. Also has some pain in both groins but the right one is worst.  [facility] at [place] notified regarding scheduling. Appointment scheduled per protocol. Home care advice given with verbal understanding.   Reason for Disposition . [1] MODERATE back pain (e.g., interferes with normal activities) AND [2] present > 3 days  Answer Assessment - Initial Assessment Questions 1. ONSET: "When did the pain begin?"      Worst the last 2 nights 2. LOCATION: "Where does it hurt?" (upper, mid or lower back)     Top of back to shoulders and to the left 3. SEVERITY: "How bad is the pain?"  (e.g., Scale 1-10; mild, moderate, or severe)   - MILD (1-3): doesn't interfere with normal activities    - MODERATE (4-7): interferes with normal activities or awakens from sleep    - SEVERE (8-10): excruciating pain, unable to do any normal activities      Pain # 7 4. PATTERN: "Is the pain constant?" (e.g., yes, no; constant, intermittent)  steady 5. RADIATION: "Does the pain shoot into your legs or elsewhere?"     no 6. CAUSE:  "What do you think is causing the back pain?"      ?gas 7. BACK OVERUSE:  "Any recent lifting of heavy objects, strenuous work or exercise?"     no 8. MEDICATIONS: "What have you taken so far for the pain?" (e.g., nothing, acetaminophen, NSAIDS)     Took aleve and did not help 9. NEUROLOGIC SYMPTOMS: "Do you have any weakness, numbness, or problems with bowel/bladder control?"     no 10. OTHER SYMPTOMS: "Do you have any other symptoms?" (e.g., fever, abdominal pain,  burning with urination, blood in urine) no  Protocols used: BACK PAIN-A-AH

## 2018-02-01 NOTE — Assessment & Plan Note (Addendum)
S: mild improvement in belching on omeprazole 20mg  daily this week. She does continue to use nsaids. She has had resolution of belching on PPI in the past  A/P: asked patient to continue omeprazole as this could be GERD. I asked her to avoid nsaids which could worsen GERD symptoms  She is also having some constipation- encouraged continued miralax for this.

## 2018-02-01 NOTE — Patient Instructions (Addendum)
Continue omeprazole 20mg - when you have been consistent with this in the past you have seen improvement in belching.   No aleve or advil. Tylenol only.   Continue miralax as well  Depo medrol (steroid shot) 40mg  IM today- to try to help with arthritis in the back and the hip  If you arent better in 2 weeks let me know and I will refer you to sports medicine or orthopedics  Can use sparing tramadol to help with pain. Try half tablet at first. No driving for 8 hours after taking this so may just want to try at night.

## 2018-02-01 NOTE — Progress Notes (Signed)
Subjective:  [name] [name] is a 77 y.o. year old very pleasant female patient who presents for/with See problem oriented charting ROS- burping/belching, lower abdominal pain, constipation, epigastric pain, right upper back pain   Past Medical History-  Patient Active Problem List   Diagnosis Date Noted  . CIGARETTE SMOKER 10/09/2007    Priority: High  . Belching 02/01/2018    Priority: Medium  . Hypothyroidism 10/12/2007    Priority: Medium  . HYPERCHOLESTEROLEMIA 10/08/2007    Priority: Medium  . COPD (chronic obstructive pulmonary disease) ([place]) 10/08/2007    Priority: Medium  . Varicose veins 12/02/2013    Priority: Low  . OA (osteoarthritis) 08/15/2012    Priority: Low  . Hearing loss 08/16/2009    Priority: Low  . BACK PAIN, LUMBAR 10/08/2007    Priority: Low    Medications- reviewed and updated Current Outpatient Medications  Medication Sig Dispense Refill  . aspirin EC 81 MG EC tablet Take 81 mg by mouth daily.      . Cholecalciferol (VITAMIN D3 PO) Take 200 Units by mouth.    . levothyroxine (SYNTHROID, LEVOTHROID) 75 MCG tablet TAKE 1 TABLET(75 MCG) BY MOUTH DAILY 90 tablet 1  . omeprazole (PRILOSEC) 20 MG capsule Take 1 capsule (20 mg total) by mouth daily. 30 capsule 3  . polyethylene glycol powder (GLYCOLAX/MIRALAX) powder Take 17 g by mouth daily. 3350 g 1  . traMADol (ULTRAM) 50 MG tablet Take 0.5-1 tablets (25-50 mg total) by mouth every 6 (six) hours as needed for moderate pain or severe pain. 20 tablet 0   No current facility-administered medications for this visit.     Objective: BP 130/78 (BP Location: Left Arm, Patient Position: Sitting, Cuff Size: Normal)   Pulse 76   Temp 98 F (36.7 C) (Oral)   Ht 5\' 5"  (1.651 m)   Wt 149 lb (67.6 kg)   SpO2 94%   BMI 24.79 kg/m  Gen: NAD, resting comfortably CV: RRR no murmurs rubs or gallops Lungs: CTAB no crackles, wheeze, rhonchi Abdomen: soft/mild epigastric pain as well as RLQ pain but otherwise  nontender- belching worsens during exam/nondistended/normal bowel sounds. No rebound or guarding.  Ext: no edema Skin: warm, dry MSK: midline pain along thoracic spine upper portion- no pain along lumbar spine. Patient with reasonable ROM of hip though some pain in back with ROM - denies groin pain but limps in pain when walking  Assessment/Plan:  COPD (chronic obstructive pulmonary disease) No wheezing on exam- doubt current copd exacerbation  Belching S: mild improvement in belching on omeprazole 20mg  daily this week. She does continue to use nsaids. She has had resolution of belching on PPI in the past  A/P: asked patient to continue omeprazole as this could be GERD. I asked her to avoid nsaids which could worsen GERD symptoms  She is also having some constipation- encouraged continued miralax for this.    OA (osteoarthritis) Right hip pain Mid to Upper back pain radiating to the left S:  didn't sleep at all last upper back going up across left mid back- she has had a flare up in pain last few days. Starts up when she lays down in the bed. Tried a firmer setting on sleep number bed to try to get comfortable and slept for an hour and it didnt seem to help after that  Also hurts in right groin- hard to walk for about a week. Wonders about gas- not having bowe movements as frequently. Aleve and advil tried-  advised against given potential GERD. Tylenol only advised A/P: Got x-rays of thoracic spine given this is keeping her from sleep. On my view of films - looks like likely has thoracic OA which could be causing her symptoms. Also with the hip appears to have arthritis on R. She asks for steroid injection which was given IM today. We are going to follow up in a month to recheck. If she fails to improve in 2 weeks can get [facility] into [facility] per her preference or Dr. Paulla Fore. Discussed if new or worsening symptoms return to see [place]. Avoid nsaids- tylenol only. Given smoking history could get  x-ray at follow up if pain in upper back not improving  Given severity of pain- did give her a 5 day supply of tramadol to give the steroid some time to kick in    Future Appointments  Date Time Provider [place]  02/28/2018 10:00 AM Marin Olp, MD [facility]   No follow-ups on file.  Lab/Order associations: Mid back pain - Plan: DG Thoracic Spine W/Swimmers, methylPREDNISolone acetate (DEPO-MEDROL) injection 40 mg  Right groin pain - Plan: DG HIP UNILAT W OR W/O PELVIS 2-3 VIEWS RIGHT  Chronic obstructive pulmonary disease, unspecified COPD type ([place])  Belching  Primary osteoarthritis involving multiple joints  Meds ordered this encounter  Medications  . traMADol (ULTRAM) 50 MG tablet    Sig: Take 0.5-1 tablets (25-50 mg total) by mouth every 6 (six) hours as needed for moderate pain or severe pain.    Dispense:  20 tablet    Refill:  0  . methylPREDNISolone acetate (DEPO-MEDROL) injection 40 mg    Return precautions advised.  [name], MD

## 2018-02-01 NOTE — Telephone Encounter (Signed)
fYI  

## 2018-02-05 ENCOUNTER — Telehealth: Payer: Self-pay | Admitting: Family Medicine

## 2018-02-05 NOTE — Telephone Encounter (Signed)
Dr. Yong Channel, please see message and advise.

## 2018-02-05 NOTE — Telephone Encounter (Signed)
She has some significant arthritis- would be reasonable for her to see Dr. Paulla Fore about the right hip

## 2018-02-05 NOTE — Telephone Encounter (Signed)
Please call pt and schedule appointment with Dr. Paulla Fore for her hip pain per Dr. Yong Channel.

## 2018-02-05 NOTE — Telephone Encounter (Signed)
Patient is scheduled for 8/22 with Dr. Paulla Fore

## 2018-02-05 NOTE — Telephone Encounter (Signed)
Copied from [place] [id number]. Topic: Quick Communication - See Telephone Encounter >> Feb 05, 2018  8:43 AM [name] [name] wrote: CRM for notification. See Telephone encounter for: 02/05/18. Patient is requesting to speak with Dr Yong Channel regarding xray, would like to let him know that the week prior to xray she picked up a gallon of water with her right hand and felt a pulling sensation on right side on top of leg and groin area. Just wanted to make provider aware to see if she would still need to see Dr Paulla Fore or is that something Dr Yong Channel can help with. Reports shot has no made any improvement just yet. Please advise.

## 2018-02-05 NOTE — Telephone Encounter (Signed)
See note

## 2018-02-05 NOTE — Telephone Encounter (Signed)
Noted  

## 2018-02-07 ENCOUNTER — Ambulatory Visit: Payer: Self-pay

## 2018-02-07 ENCOUNTER — Encounter: Payer: Self-pay | Admitting: Sports Medicine

## 2018-02-07 ENCOUNTER — Ambulatory Visit: Payer: Medicare Other | Admitting: Sports Medicine

## 2018-02-07 VITALS — BP 130/72 | HR 74 | Wt 149.0 lb

## 2018-02-07 DIAGNOSIS — R1031 Right lower quadrant pain: Secondary | ICD-10-CM

## 2018-02-07 DIAGNOSIS — M1611 Unilateral primary osteoarthritis, right hip: Secondary | ICD-10-CM | POA: Diagnosis not present

## 2018-02-07 DIAGNOSIS — F17200 Nicotine dependence, unspecified, uncomplicated: Secondary | ICD-10-CM

## 2018-02-07 DIAGNOSIS — R269 Unspecified abnormalities of gait and mobility: Secondary | ICD-10-CM | POA: Diagnosis not present

## 2018-02-07 DIAGNOSIS — M25551 Pain in right hip: Secondary | ICD-10-CM

## 2018-02-07 NOTE — Progress Notes (Signed)
[name] [name]. [name] [name], [place] at [facility] [phone]  [name] [name] - 77 y.o. female MRN [id number]  Date of birth: Sep 02, 1940  Visit Date: 02/07/2018  PCP: Marin Olp, MD   Referred by: Marin Olp, MD  Scribe(s) for today's visit: Wendy Poet, LAT, ATC  SUBJECTIVE:  [name] [name] is here for New Patient (Initial Visit) (R hip and back pain) .  Referred by: Dr. Yong [name]  Her back and R hip pain symptoms INITIALLY: Began about 3 weeks ago after carrying a gallon of water to her bedroom.  She has the most pain as soon as she stands from sitting.  She has difficulty w/ walking, especially with the early steps. Described as moderate cramping/aching pain, radiating to the R hip and groin Worsened with sitting and when she first stands from sitting. Improved with Tramadol prescribed by Dr. Yong [name] and improved w/ heat Additional associated symptoms include: no N/T noted in B LEs; increased symptoms w/ coughing/sneezing    At this time symptoms are worsening compared to onset w/ increased pain She has been taking Tylenol and Tramadol.    1976 - exploratory back surgery 1983 - scar tissue removed from lower back    REVIEW OF SYSTEMS: Reports night time disturbances. Denies fevers, chills, or night sweats. Denies unexplained weight loss. Denies personal history of cancer. Reports changes in bowel or bladder habits. Denies recent unreported falls. Denies new or worsening dyspnea or wheezing. Denies headaches or dizziness.  Denies numbness, tingling or weakness  In the extremities.  Denies dizziness or presyncopal episodes Denies lower extremity edema     HISTORY & PERTINENT PRIOR DATA:  Significant/pertinent history, findings, studies include:  reports that she has been smoking cigarettes. She has a 20.00 pack-year smoking history. She has never used smokeless tobacco. No results for input(s): HGBA1C, LABURIC,  CREATINE in the last 8760 hours. No specialty comments available. Problem  Primary Osteoarthritis of Right Hip   Severe Per x-rays obtained on 02/04/2018.  She does have some associated degenerative disc disease as well.   Inj 02/07/18 - DEPO 79     Otherwise prior history reviewed and updated per electronic medical record.    OBJECTIVE:  VS:  HT:    WT:149 lb (67.6 kg)  BMI:24.79    BP:130/72  HR:74bpm  TEMP: ( )  RESP:96 %   PHYSICAL EXAM: CONSTITUTIONAL: Well-developed, Well-nourished and In no acute distress Alert & appropriately interactive. and Not depressed or anxious appearing. RESPIRATORY: No increased work of breathing and Trachea Midline EYES: Pupils are equal., EOM intact without nystagmus. and No scleral icterus.  Lower extremities: Warm and well perfused Edema: No significant swelling or edema NEURO: unremarkable Normal associated myotomal distribution strength to manual muscle testing Normal sensation to light touch  MSK Exam: Right HIP Exam: Well aligned, no significant deformity. No overlying skin changes. TTP over Right gluteal muscles and anterior hip joint.  No significant abdominal pain with palpation.   Log Roll: positive, moderate pain FADIR: positive, moderate pain FABER: positive, mild pain Stinchfield testing: positive, moderate pain Strength: 4/5 Axial loading produces: Mild pain and Mild crepitation  PROCEDURES & DATA REVIEWED:  US Guided Injection per procedure note Outside/prior images reviewed today with the patient that showed Severe degenerative changes of the right hip.  ASSESSMENT   1. Right hip pain   2. Right groin pain   3. Primary osteoarthritis of right hip   4. Gait disturbance  5. CIGARETTE SMOKER     PLAN:       .  Primary osteoarthritis of right hip Injection performed today.  She is a candidate for total hip arthroplasty but poor surgical candidate due to her smoking status and other.  Can consider repeat  injections we will see her back in 2 weeks.   Follow-up: Return in about 10 weeks (around 04/18/2018) for consideration of repeat injections.      Please see additional documentation for Objective, Assessment and Plan sections. Pertinent additional documentation may be included in corresponding procedure notes, imaging studies, problem based documentation and patient instructions. Please see these sections of the encounter for additional information regarding this visit.  CMA/ATC served as Education administrator during this visit. History, Physical, and Plan performed by medical provider. Documentation and orders reviewed and attested to.      Gerda Diss, [place] [facility] Physician

## 2018-02-07 NOTE — Patient Instructions (Addendum)

## 2018-02-07 NOTE — Assessment & Plan Note (Signed)
Injection performed today.  She is a candidate for total hip arthroplasty but poor surgical candidate due to her smoking status and other.  Can consider repeat injections we will see her back in 2 weeks.

## 2018-02-07 NOTE — Procedures (Signed)
PROCEDURE NOTE:  Ultrasound Guided: Injection: Right hip Images were obtained and interpreted by myself, Teresa Coombs, DO  Images have been saved and stored to PACS system. Images obtained on: GE S7 Ultrasound machine    ULTRASOUND FINDINGS:  Marked degenerative changes.  Small effusion.  Generalized synovitis.   DESCRIPTION OF PROCEDURE:  The patient's clinical condition is marked by substantial pain and/or significant functional disability. Other conservative therapy has not provided relief, is contraindicated, or not appropriate. There is a reasonable likelihood that injection will significantly improve the patient's pain and/or functional impairment.   After discussing the risks, benefits and expected outcomes of the injection and all questions were reviewed and answered, the patient wished to undergo the above named procedure.  Verbal consent was obtained.  The ultrasound was used to identify the target structure and adjacent neurovascular structures. The skin was then prepped in sterile fashion and the target structure was injected under direct visualization using sterile technique as below:  Single injection performed as below: PREP: Alcohol and Ethel Chloride APPROACH:direct, stopcock technique, 22g 3.5 in. INJECTATE: 5 cc 1% lidocaine, 2 cc 0.5% Marcaine and 2 cc 40mg /mL DepoMedrol ASPIRATE: None DRESSING: Band-Aid  Post procedural instructions including recommending icing and warning signs for infection were reviewed.    This procedure was well tolerated and there were no complications.   IMPRESSION: Succesful Ultrasound Guided: Injection

## 2018-02-15 ENCOUNTER — Ambulatory Visit: Payer: Medicare Other | Admitting: Family Medicine

## 2018-02-19 ENCOUNTER — Telehealth: Payer: Self-pay | Admitting: Family Medicine

## 2018-02-19 NOTE — Telephone Encounter (Signed)
Please see message and advise 

## 2018-02-19 NOTE — Telephone Encounter (Signed)
Copied from [place] [id number]. Topic: Quick Communication - See Telephone Encounter >> Feb 19, 2018  9:59 AM Neva Seat wrote: Pain medication is about to run out and shot

## 2018-02-19 NOTE — Telephone Encounter (Signed)
Per CRM: Pain medication is about to run out that Dr. Yong Channel prescribed.  Pt states that the medication isn't helping and shot Dr. Paulla Fore gave her on the 22 isn't helping either.   Pt states her pain is worse. Please call pt back to discuss next thing to do asap

## 2018-02-19 NOTE — Telephone Encounter (Signed)
Molly Thanks- I am willing to refill the tramadol if needed but since she stated not helping- not sure if that's worth it. Let me know after visit tomorrow.

## 2018-02-19 NOTE — Telephone Encounter (Signed)
Pt scheduled for f/u w/ Dr. Paulla Fore on 02/20/18

## 2018-02-20 ENCOUNTER — Ambulatory Visit: Payer: Medicare Other | Admitting: Sports Medicine

## 2018-02-20 MED ORDER — TRAMADOL HCL 50 MG PO TABS: 50.0000 mg | ORAL_TABLET | Freq: Four times a day (QID) | ORAL | 0 refills | Status: DC | PRN

## 2018-02-20 NOTE — Telephone Encounter (Signed)
I sent tramadol in- please inform patient.

## 2018-02-20 NOTE — Addendum Note (Signed)
Addended by: Marin Olp on: 02/20/2018 04:40 PM   Modules accepted: Orders

## 2018-02-20 NOTE — Telephone Encounter (Signed)
Patient had to cancel appointment today due to Dr. Paulla Fore being out. Patient stated she still needs the medication but something stronger and can not wait until her 9/12 appointment or Friday's appointment. She would like a call back from the nurse.

## 2018-02-21 ENCOUNTER — Encounter: Payer: Self-pay | Admitting: Sports Medicine

## 2018-02-21 ENCOUNTER — Ambulatory Visit: Payer: Medicare Other | Admitting: Sports Medicine

## 2018-02-21 VITALS — BP 120/84 | HR 66 | Ht 65.0 in

## 2018-02-21 DIAGNOSIS — F17200 Nicotine dependence, unspecified, uncomplicated: Secondary | ICD-10-CM

## 2018-02-21 DIAGNOSIS — M25551 Pain in right hip: Secondary | ICD-10-CM | POA: Diagnosis not present

## 2018-02-21 DIAGNOSIS — R269 Unspecified abnormalities of gait and mobility: Secondary | ICD-10-CM | POA: Diagnosis not present

## 2018-02-21 DIAGNOSIS — R1031 Right lower quadrant pain: Secondary | ICD-10-CM | POA: Diagnosis not present

## 2018-02-21 DIAGNOSIS — M1611 Unilateral primary osteoarthritis, right hip: Secondary | ICD-10-CM | POA: Diagnosis not present

## 2018-02-21 DIAGNOSIS — M549 Dorsalgia, unspecified: Secondary | ICD-10-CM

## 2018-02-21 MED ORDER — OXYCODONE-ACETAMINOPHEN 5-325 MG PO TABS: 1.0000 | ORAL_TABLET | Freq: Three times a day (TID) | ORAL | 0 refills | Status: DC | PRN

## 2018-02-21 NOTE — Progress Notes (Signed)
[name] [name]. [name] [name], [place] at [facility] [phone]  FANI ROTONDO - 77 y.o. female MRN [id number]  Date of birth: March 20, 1941  Visit Date: 02/21/2018  PCP: Marin Olp, MD   Referred by: Marin Olp, MD  Scribe(s) for today's visit: Josepha Pigg, CMA  SUBJECTIVE:  [name] [name] is here for Follow-up (R hip/groin pain)   02/07/2018: Her back and R hip pain symptoms INITIALLY: Began about 3 weeks ago after carrying a gallon of water to her bedroom.  She has the most pain as soon as she stands from sitting.  She has difficulty w/ walking, especially with the early steps. Described as moderate cramping/aching pain, radiating to the R hip and groin Worsened with sitting and when she first stands from sitting. Improved with Tramadol prescribed by Dr. Yong Channel and improved w/ heat Additional associated symptoms include: no N/T noted in B LEs; increased symptoms w/ coughing/sneezing   At this time symptoms are worsening compared to onset w/ increased pain She has been taking Tylenol and Tramadol.   1976 - exploratory back surgery 1983 - scar tissue removed from lower back  02/21/2018: Compared to the last office visit, her previously described symptoms are worsening. Pain is constant and more severe, she reports that it has been this way since receiving the injection. She noted that when sx first started, she felt a shocking pain on the lateral aspect of the hip. She has trouble sleeping at night d/t pain, it is tender when trying to ly on her side.  Current symptoms are severe & are radiating to the lower back, gluteal region, anterior/lateral R leg.  She has been taking tramadol with minimal relief. She takes Tylenol prn with no relief.  She received steroid hip injection 02/07/2018 and did not tolerate this well.  She reports constipation since receiving injection. Prior to the inj she was doing well with BM and was able to stop  her Miralax. Since receiving steroid injection she has had trouble having BM. She reports that she took 6 doses of Miralax before she was able to have BM.    REVIEW OF SYSTEMS: Reports night time disturbances. Denies fevers, chills, or night sweats. Denies unexplained weight loss. Denies personal history of cancer. Reports changes in bowel or bladder habits. Denies recent unreported falls. Denies new or worsening dyspnea or wheezing. Denies headaches or dizziness.  Reports weakness in the extremities.  Denies dizziness or presyncopal episodes Denies lower extremity edema    HISTORY:  Prior history reviewed and updated per electronic medical record.  Social History   Occupational History  . Occupation: retired    Fish farm manager: RETIRED  Tobacco Use  . Smoking status: Current Every Day Smoker    Packs/day: 0.50    Years: 40.00    Pack years: 20.00    Types: Cigarettes  . Smokeless tobacco: Never Used  . Tobacco comment: 1/2-3/4 of a pk a day   Substance and Sexual Activity  . Alcohol use: No    Alcohol/week: 0.0 standard drinks  . Drug use: No  . Sexual activity: Not on file   Social History   Social History Narrative   Married 2008 (3rd marriage). 2 sons by first husband. 3 grandkids.       Retired Art gallery manager for 40 years      Hobbies: Designer, multimedia games, go to casino ([place]     DATA OBTAINED & REVIEWED:  No results for input(s):  HGBA1C, LABURIC, CREATINE in the last 8760 hours. . 02/01/2018: X-rays right hip: Severe degenerative changes of the right hip joint. . 02/07/2018: Right ultrasound-guided intra-articular hip injection performed .   OBJECTIVE:  VS:  HT:5\' 5"  (165.1 cm)   WT:(declines d/t pain)  BMI:     BP:120/84  HR:66bpm  TEMP: ( )  RESP:96 %   PHYSICAL EXAM: CONSTITUTIONAL: Well-developed, Well-nourished and In moderate pain. PSYCHIATRIC: Alert & appropriately interactive. and Not depressed or anxious appearing. RESPIRATORY: No  increased work of breathing and Trachea Midline EYES: Pupils are equal., EOM intact without nystagmus. and No scleral icterus.  VASCULAR EXAM: Warm and well perfused NEURO: unremarkable  MSK Exam: Right Leg  Well aligned, no significant deformity. No overlying skin changes. TTP over Gluteal musculature.  Posterior pelvis.   RANGE OF MOTION & STRENGTH  Marked pain with hip flexion, FADIR and Faber testing.   SPECIALITY TESTING:  Stinchfield testing is markedly positive.  Pain with axial load and circumduction with crepitation.     ASSESSMENT   1. Right groin pain   2. Right hip pain   3. Primary osteoarthritis of right hip   4. Gait disturbance   5. CIGARETTE SMOKER   6. Mid back pain     PLAN:  Pertinent additional documentation may be included in corresponding procedure notes, imaging studies, problem based documentation and patient instructions.  Procedures:  . None  Medications:  Meds ordered this encounter  Medications  . DISCONTD: oxyCODONE-acetaminophen (PERCOCET) 5-325 MG tablet    Sig: Take 1 tablet by mouth every 8 (eight) hours as needed for severe pain.    Dispense:  15 tablet    Refill:  0  . DISCONTD: oxyCODONE-acetaminophen (PERCOCET) 5-325 MG tablet    Sig: Take 1 tablet by mouth every 8 (eight) hours as needed for severe pain.    Dispense:  15 tablet    Refill:  0    Discontinue other Tramadol order.  This is the only Pain Rx to fill   Discussion/Instructions: No problem-specific Assessment & Plan notes found for this encounter.  . Persistent worsening pain.  Short course of pain medication provided tramadol intra-articular injections have not been beneficial. . Further Testing ordered: Further diagnostic work-up with CT of the pelvis to evaluate for possible stress fracture versus severe OA . Discussed red flag symptoms that warrant earlier emergent evaluation and patient voices understanding. . Activity modifications and the importance of  avoiding exacerbating activities (limiting pain to no more than a 4 / 10 during or following activity) recommended and discussed.  Follow-up:  . Return for CT Scan review in person.   . If any lack of improvement consider: Referral to orthopedic surgery for total hip arthroplasty if CT scan is nonrevealing.  .      CMA/ATC served as Education administrator during this visit. History, Physical, and Plan performed by medical provider. Documentation and orders reviewed and attested to.      Gerda Diss, [place] [facility] Physician

## 2018-02-21 NOTE — Patient Instructions (Addendum)
We are setting you up for a CT scan of your pelvis to evaluate for a potential stress fracture/compression fracture.  I have sent in a percocet prescription.

## 2018-02-22 ENCOUNTER — Ambulatory Visit: Payer: Medicare Other | Admitting: Sports Medicine

## 2018-02-22 NOTE — Telephone Encounter (Signed)
Patient did not pick up the Tramadol. Dr. Paulla Fore sent in Boyd in to the pharmacy yesterday when he seen the patient. Patient will be seeing you next week.

## 2018-02-25 ENCOUNTER — Telehealth: Payer: Self-pay

## 2018-02-25 DIAGNOSIS — M25551 Pain in right hip: Secondary | ICD-10-CM

## 2018-02-25 NOTE — Telephone Encounter (Signed)
Checked referral, no PA required, order to [facility]. Called pt and advised. She will contact [facility] to schedule.

## 2018-02-25 NOTE — Telephone Encounter (Signed)
Copied from [place] [id number]. Topic: Referral - Status >> Feb 25, 2018 11:22 AM Ivar Drape wrote: Reason for CRM:  Patient was in the office last week and a CT Scan was suppose to be ordered.  She is checking on the progress of that referral because no one has contacted her yet about it.

## 2018-02-26 ENCOUNTER — Other Ambulatory Visit: Payer: Medicare Other

## 2018-02-26 NOTE — Addendum Note (Signed)
Addended by: Jasper Loser on: 02/26/2018 12:09 PM   Modules accepted: Orders

## 2018-02-27 ENCOUNTER — Ambulatory Visit (INDEPENDENT_AMBULATORY_CARE_PROVIDER_SITE_OTHER)
Admission: RE | Admit: 2018-02-27 | Discharge: 2018-02-27 | Disposition: A | Discharge status: Home / Self Care | Payer: Medicare Other | Source: Ambulatory Visit | Attending: Sports Medicine | Admitting: Sports Medicine

## 2018-02-27 DIAGNOSIS — M25551 Pain in right hip: Secondary | ICD-10-CM | POA: Diagnosis not present

## 2018-02-27 DIAGNOSIS — S3289XA Fracture of other parts of pelvis, initial encounter for closed fracture: Secondary | ICD-10-CM | POA: Diagnosis not present

## 2018-02-28 ENCOUNTER — Encounter: Payer: Self-pay | Admitting: Family Medicine

## 2018-02-28 ENCOUNTER — Ambulatory Visit (INDEPENDENT_AMBULATORY_CARE_PROVIDER_SITE_OTHER): Payer: Medicare Other | Admitting: Family Medicine

## 2018-02-28 VITALS — BP 130/84 | HR 67 | Temp 97.6°F | Ht 65.0 in

## 2018-02-28 DIAGNOSIS — K5900 Constipation, unspecified: Secondary | ICD-10-CM | POA: Diagnosis not present

## 2018-02-28 DIAGNOSIS — S32591A Other specified fracture of right pubis, initial encounter for closed fracture: Secondary | ICD-10-CM

## 2018-02-28 MED ORDER — OXYCODONE-ACETAMINOPHEN 5-325 MG PO TABS: 1.0000 | ORAL_TABLET | ORAL | 0 refills | Status: DC | PRN

## 2018-02-28 NOTE — Patient Instructions (Addendum)
Schedule your bone density test at check out desk. You may also call directly to X-ray at [phone] to schedule an appointment that is convenient for you.  - located [address] across the [address] from [place] - in the basement - you do need an appointment for the bone density tests.   We will call you within two weeks about your referral to [facility] orthopedics for fracture of pelvic bones- does appear to be healing. If you do not hear within 3 weeks, give [place] a call.   Refilled percocet. Can use up to every 4 hours. Try to space to 6-8 hours if you can. Do not take extra tylenol with this.

## 2018-02-28 NOTE — Progress Notes (Signed)
Subjective:  [name] [name] is a 77 y.o. year old very pleasant female patient who presents for/with See problem oriented charting ROS- chronic pain, constipation. Stress and some fatigue.    Past Medical History-  Patient Active Problem List   Diagnosis Date Noted  . CIGARETTE SMOKER 10/09/2007    Priority: High  . Belching 02/01/2018    Priority: Medium  . Hypothyroidism 10/12/2007    Priority: Medium  . HYPERCHOLESTEROLEMIA 10/08/2007    Priority: Medium  . COPD (chronic obstructive pulmonary disease) ([place]) 10/08/2007    Priority: Medium  . Varicose veins 12/02/2013    Priority: Low  . OA (osteoarthritis) 08/15/2012    Priority: Low  . Hearing loss 08/16/2009    Priority: Low  . BACK PAIN, LUMBAR 10/08/2007    Priority: Low  . Primary osteoarthritis of right hip 02/07/2018    Medications- reviewed and updated Current Outpatient Medications  Medication Sig Dispense Refill  . aspirin EC 81 MG EC tablet Take 81 mg by mouth daily.      Marland Kitchen levothyroxine (SYNTHROID, LEVOTHROID) 75 MCG tablet TAKE 1 TABLET(75 MCG) BY MOUTH DAILY 90 tablet 1  . omeprazole (PRILOSEC) 20 MG capsule Take 1 capsule (20 mg total) by mouth daily. 30 capsule 3  . oxyCODONE-acetaminophen (PERCOCET) 5-325 MG tablet Take 1 tablet by mouth every 4 (four) hours as needed for severe pain (no driving 6 hours after taking). 30 tablet 0  . polyethylene glycol powder (GLYCOLAX/MIRALAX) powder Take 17 g by mouth daily. 3350 g 1  . acetaminophen (TYLENOL) 500 MG tablet Take 500 mg by mouth daily.    . Cholecalciferol (VITAMIN D3 PO) Take 200 Units by mouth.     No current facility-administered medications for this visit.     Objective: BP 130/84 (BP Location: Left Arm, Patient Position: Sitting, Cuff Size: Normal)   Pulse 67   Temp 97.6 F (36.4 C) (Oral)   Ht 5\' 5"  (1.651 m)   SpO2 95%   BMI 24.79 kg/m  Gen: NAD, resting comfortably CV: RRR no murmurs rubs or gallops Lungs: CTAB no crackles, wheeze,  rhonchi Abdomen: soft/nontender/nondistended/normal bowel sounds.  Ext: no edema Skin: warm, dry Neuro: walking with walker  Ct Pelvis Wo Contrast  Result Date: 02/27/2018 CLINICAL DATA:  Right hip and buttock pain for 3 weeks. EXAM: CT PELVIS WITHOUT CONTRAST TECHNIQUE: Multidetector CT imaging of the pelvis was performed following the standard protocol without intravenous contrast. COMPARISON:  None. FINDINGS: Urinary Tract:  No abnormality visualized. Bowel: Diverticulosis of colon is noted. There is no diverticulitis on visualized images. Vascular/Lymphatic: Atherosclerosis of the vessels are identified. Reproductive:  No mass or other significant abnormality Other:  None. Musculoskeletal: There is healing fracture of the right inferior pubic rami. There is fracture with some healing in the anterior aspect of right acetabulum/lateral aspect of the right superior pubic rami. Degenerative joint changes of the spine are noted. IMPRESSION: Healing fractures of the right pelvis as described. Electronically Signed   By: Abelardo Diesel M.D.   On: 02/27/2018 14:04   Assessment/Plan:  Closed fracture of multiple rami of right pubis, initial encounter ([place]) - Plan: DG Bone Density, Ambulatory referral to Orthopedics S:  Patient presents for follow up- now has had pain for about a month. At first visit noted to have OA of right hip- steroid injection with Dr. Paulla Fore but failed to improve. Continued to have groin pain and was seen back by Dr. Paulla Fore who ordered CT scan of pelvis with results  as above. This showed healing fractures in the right pelvis. She continues to have significant pain. Percocet lasts her about 2 hours as far as good relief- was scheduled q 8 hours.  A/P: Pain likely due to 2 fractures noted above- will refer to orthopedics as Dr. Paulla Fore out of office likely for next week or so. I did refill #30 percocet and moved to q4 hours- will get nursing staff to print me a copy of [place] to review.  Given no obvious fall or injury will get DEXA to evaluate bone density. Asked her to try to limit her walking until evaluation by ortho though I am encouraged by fact fractures are healing.   Constipation S: having some constipation on tramadol and now percocet- she thought related to injections but discussed more likely opiate related A/P: discussed half capful of miralax at minimum as long as no diarrhea- then can go to full capful if still having issues while on percocet  Lab/Order associations: Closed fracture of multiple rami of right pubis, initial encounter ([place]) - Plan: DG Bone Density, Ambulatory referral to Orthopedics, CANCELED: Ambulatory referral to Orthopedics  Meds ordered this encounter  Medications  . oxyCODONE-acetaminophen (PERCOCET) 5-325 MG tablet    Sig: Take 1 tablet by mouth every 4 (four) hours as needed for severe pain (no driving 6 hours after taking).    Dispense:  30 tablet    Refill:  0    Discontinue other Tramadol order.  This is the only Pain Rx to fill   Return precautions advised.  [name], MD

## 2018-03-01 ENCOUNTER — Telehealth: Payer: Self-pay | Admitting: Family Medicine

## 2018-03-01 NOTE — Telephone Encounter (Signed)
Patient stated that doctor Yong Channel went over her CT results wit her and that she does not need to come in for her appt on 03/06/18 with Paulla Fore. Please advise.

## 2018-03-04 ENCOUNTER — Ambulatory Visit: Payer: Medicare Other | Admitting: Family Medicine

## 2018-03-04 ENCOUNTER — Ambulatory Visit (INDEPENDENT_AMBULATORY_CARE_PROVIDER_SITE_OTHER)
Admission: RE | Admit: 2018-03-04 | Discharge: 2018-03-04 | Disposition: A | Discharge status: Home / Self Care | Payer: Medicare Other | Source: Ambulatory Visit | Attending: Family Medicine | Admitting: Family Medicine

## 2018-03-04 ENCOUNTER — Encounter: Payer: Self-pay | Admitting: Family Medicine

## 2018-03-04 ENCOUNTER — Ambulatory Visit: Payer: Self-pay | Admitting: Family Medicine

## 2018-03-04 VITALS — BP 130/72 | HR 75 | Temp 97.7°F | Ht 65.0 in | Wt 149.0 lb

## 2018-03-04 DIAGNOSIS — R112 Nausea with vomiting, unspecified: Secondary | ICD-10-CM | POA: Diagnosis not present

## 2018-03-04 DIAGNOSIS — K219 Gastro-esophageal reflux disease without esophagitis: Secondary | ICD-10-CM | POA: Diagnosis not present

## 2018-03-04 DIAGNOSIS — R142 Eructation: Secondary | ICD-10-CM

## 2018-03-04 DIAGNOSIS — S32591A Other specified fracture of right pubis, initial encounter for closed fracture: Secondary | ICD-10-CM

## 2018-03-04 MED ORDER — ONDANSETRON 4 MG PO TBDP: 4.0000 mg | ORAL_TABLET | Freq: Three times a day (TID) | ORAL | 0 refills | Status: AC | PRN

## 2018-03-04 NOTE — Progress Notes (Signed)
Subjective:  [name] [name] is a 77 y.o. year old very pleasant female patient who presents for/with See problem oriented charting ROS- denies chest pain . No weight loss. No edema. No fever or chills.   Past Medical History-  Patient Active Problem List   Diagnosis Date Noted  . CIGARETTE SMOKER 10/09/2007    Priority: High  . Belching 02/01/2018    Priority: Medium  . Hypothyroidism 10/12/2007    Priority: Medium  . HYPERCHOLESTEROLEMIA 10/08/2007    Priority: Medium  . COPD (chronic obstructive pulmonary disease) ([place]) 10/08/2007    Priority: Medium  . Varicose veins 12/02/2013    Priority: Low  . OA (osteoarthritis) 08/15/2012    Priority: Low  . Hearing loss 08/16/2009    Priority: Low  . BACK PAIN, LUMBAR 10/08/2007    Priority: Low  . Primary osteoarthritis of right hip 02/07/2018    Medications- reviewed and updated Current Outpatient Medications  Medication Sig Dispense Refill  . acetaminophen (TYLENOL) 500 MG tablet Take 500 mg by mouth daily.    Marland Kitchen aspirin EC 81 MG EC tablet Take 81 mg by mouth daily.      . Cholecalciferol (VITAMIN D3 PO) Take 200 Units by mouth.    . levothyroxine (SYNTHROID, LEVOTHROID) 75 MCG tablet TAKE 1 TABLET(75 MCG) BY MOUTH DAILY 90 tablet 1  . omeprazole (PRILOSEC) 20 MG capsule Take 1 capsule (20 mg total) by mouth daily. 30 capsule 3  . polyethylene glycol powder (GLYCOLAX/MIRALAX) powder Take 17 g by mouth daily. 3350 g 1  . ondansetron (ZOFRAN-ODT) 4 MG disintegrating tablet Take 1 tablet (4 mg total) by mouth every 8 (eight) hours as needed for nausea or vomiting. 20 tablet 0   No current facility-administered medications for this visit.     Objective: BP 130/72 (BP Location: Left Arm, Patient Position: Sitting, Cuff Size: Large)   Pulse 75   Temp 97.7 F (36.5 C) (Oral)   Ht 5\' 5"  (1.651 m)   Wt 149 lb (67.6 kg)   SpO2 93%   BMI 24.79 kg/m  Gen: NAD, resting comfortably Moist mucus membranes CV: RRR no murmurs rubs or  gallops Lungs: CTAB no crackles, wheeze, rhonchi Abdomen: soft/mild diffuse tenderness/nondistended/normal bowel sounds. Ext: no edema Skin: warm, dry  Assessment/Plan:  Belching  Non-intractable vomiting with nausea, unspecified vomiting type  Closed fracture of multiple rami of right pubis, initial encounter ([facility])  Gastroesophageal reflux disease without esophagitis S: Patient comes in today reporting a return of her belching issues which prior had improved on prilosec. She reports throwing up anytime she eats something solid for last 3 days. Able to tolerate clear liquids. Even threw up her prilosec yesterday- didn't take any since then - seems belching likely worse since then.   Only very small  bowel movements since last visit despite miralax full capful daily- has had 2 quarter sized small bowel movements- urgency to go to bathroom and then can't have bowel movement. Drinking plenty of water.   She reports poor pain control in relation to her pelvic fracture. Even taking the oxycodone 5mg  every 4 hours didn't help in the day but was able to sleep better that night. She feels like the day she took 4 of the oxycodone she finally slept well but that symptoms of belching and emesis started next day.  A/P: This is a tough situation- pain is not well controlled and when she increases pain medicine- may be triggering emesis. This could be opiate induced gastroparesis. She  also has constipation which could be related to opiates. We will get ortho expert opinion on her fracture tomorrow- patient seems to be asking to increase strength of rx (also tried tramadol) but I was clear with her this is a strong medicine and I would prefer not to increase unless ortho recommends.   Interestingly despite reported unable to eat in 3 days or so- no weight loss noted. I would still consider GI referral if she fails to improve with keeping omeprazole down with use of zofran.   She is also missing her  omeprazole which worsens reflux and has caused belching in the past- we will try to get her restarted on that medication.   We can try zofran to see if that helps her with intake which I sent in today. Also encouraged increase miralax to BID for 3-4 days to see if can get larger BM. Could consider enema if not helping by next week.   Future Appointments  Date Time Provider [place]  03/05/2018  9:15 AM Marybelle Killings, MD [facility] None  04/18/2018  1:40 PM Gerda Diss, DO [facility]   Lab/Order associations: Belching  Non-intractable vomiting with nausea, unspecified vomiting type  Closed fracture of multiple rami of right pubis, initial encounter ([place])  Gastroesophageal reflux disease without esophagitis  Meds ordered this encounter  Medications  . ondansetron (ZOFRAN-ODT) 4 MG disintegrating tablet    Sig: Take 1 tablet (4 mg total) by mouth every 8 (eight) hours as needed for nausea or vomiting.    Dispense:  20 tablet    Refill:  0   Return precautions advised.  [name], MD

## 2018-03-04 NOTE — Telephone Encounter (Signed)
  Reason for Disposition . Caller has URGENT medication question about med that PCP prescribed and triager unable to answer question  Answer Assessment - Initial Assessment Questions Patient is calling to report she is having difficulty with the increase in dosage of pain medication. The increase is making her vomit and she is unable to tolerate foods and liquids. She is constipated.  Protocols used: MEDICATION QUESTION CALL-A-AH

## 2018-03-04 NOTE — Telephone Encounter (Signed)
Patient coming in for appointment this afternoon.

## 2018-03-04 NOTE — Telephone Encounter (Addendum)
Patient states she is vomiting on the increased dose of pain medication.Patient  states the dose of 1 every 4 hours - did not help with the pain- she did sleep well. Patient states has been vomiting and not sleeping. She is in too much pain. Patient was able to keep aspirin and synthroid down today. Patient is only tolerating soup- without particles- broth only. No solids. Patient is drinking water only. Patient reports bowels have not moved- she has had small pellet like movements- but no real movement. Call to office- they will see patient after her Bone scan appointment today to see if they can help her manage her pain and vomiting better.

## 2018-03-04 NOTE — Patient Instructions (Addendum)
Lets try anti nausea ondansetron twice a day before you are going to eat or try to take pills. This should hopefully help you keep pills down.   Your reflux sounds flared up- so we need the omeprazole to stay down  You also sound constipated so we need the miralax to stay down. Can do 2 capfuls total per day - 1 in AM and 1 in PM of miralax for 3 days- see me back if not doing better by then

## 2018-03-04 NOTE — Telephone Encounter (Signed)
Patient was seen in office today.  

## 2018-03-04 NOTE — Telephone Encounter (Signed)
Called pt to inform her that she does not need to f/u w/ Dr. Paulla Fore this Wed, Sept. 18th.  She informed me that her pain pills are causing N/V and that she will be seeing Dr. Yong Channel about this today.  She will be seeing Dr. Lorin Mercy tomorrow morning at [facility] for her Ortho referral.

## 2018-03-05 ENCOUNTER — Ambulatory Visit: Payer: Self-pay | Admitting: Family Medicine

## 2018-03-05 ENCOUNTER — Ambulatory Visit (INDEPENDENT_AMBULATORY_CARE_PROVIDER_SITE_OTHER): Payer: Medicare Other | Admitting: Orthopaedic Surgery

## 2018-03-05 ENCOUNTER — Other Ambulatory Visit: Payer: Medicare Other

## 2018-03-05 ENCOUNTER — Encounter: Payer: Self-pay | Admitting: Sports Medicine

## 2018-03-05 ENCOUNTER — Encounter (INDEPENDENT_AMBULATORY_CARE_PROVIDER_SITE_OTHER): Payer: Self-pay | Admitting: Orthopaedic Surgery

## 2018-03-05 VITALS — BP 158/94 | HR 71 | Ht 65.0 in | Wt 149.0 lb

## 2018-03-05 DIAGNOSIS — S32591S Other specified fracture of right pubis, sequela: Secondary | ICD-10-CM | POA: Diagnosis not present

## 2018-03-05 NOTE — Progress Notes (Addendum)
Office Visit Note   Patient: [name] [name]           Date of Birth: 12-03-1940           MRN: [id number] Visit Date: 03/05/2018              Requested by: Marin Olp, MD [place], [place] [address] PCP: Marin Olp, MD   Assessment & Plan: Visit Diagnoses:  1. Closed fracture of multiple rami of right pubis, sequela   2.  Constipation, possible impaction  Plan: Patient will go home and check for an impaction I gave her some gloves.  We discussed using an enema if she does not get relief.  We reviewed CT scan which image the rami fractures on the right and she understands she will get resolution of the pain associated with this with healing which is taking place.  She does have some hip osteoarthritis but her pain currently is from her fracture which is healing.  I plan to recheck her in 1 month.  Follow-Up Instructions: Return in about 1 month (around 04/04/2018).   Orders:  No orders of the defined types were placed in this encounter.  No orders of the defined types were placed in this encounter.     Procedures: No procedures performed   Clinical Data: No additional findings.   Subjective: Chief Complaint  Patient presents with  . Pelvis - Pain    HPI 77 year old female here with her husband for problems with right groin pain with ambulation.  She has some hip osteoarthritis had an injection on 02/07/2018 and had increased pain after that and gradually increase Percocet to the point where she was nauseated, throwing up, has been taking MiraLAX daily and has not had a bowel movement in over a week.  She points to the right anterior groin where she has the pain.  Obtaining history is difficult from the patient and her husband.  She states she has not fallen in 3 years but at that time she injured her shoulder.  She does not recall any slipping or sitting down on a hard surface.  Onset of groin pain and straddle pain on the right was about 4  weeks ago.  CT scan 02/27/2018 showed healing fracture of the right acetabulum and superior rami.  Bone density test 03/04/2018 showed normal bone density done at  [facility].  Review of Systems positive for right hip osteoarthritis hypothyroidism hypercholesterolemia COPD, low back pain, right superior and inferior pubic rami fracture approximately 14 weeks old and constipation related to narcotic medication.   Objective: Vital Signs: BP (!) 158/94   Pulse 71   Ht 5\' 5"  (1.651 m)   Wt 149 lb (67.6 kg)   BMI 24.79 kg/m   Physical Exam  Constitutional: She is oriented to person, place, and time. She appears well-developed.  HENT:  Head: Normocephalic.  Right Ear: External ear normal.  Left Ear: External ear normal.  Eyes: Pupils are equal, round, and reactive to light.  Neck: No tracheal deviation present. No thyromegaly present.  Cardiovascular: Normal rate.  Pulmonary/Chest: Effort normal.  Abdominal: Soft.  Neurological: She is alert and oriented to person, place, and time.  Skin: Skin is warm and dry.  Psychiatric: She has a normal mood and affect. Her behavior is normal.    Ortho Exam patient has mild discomfort with internal and external rotation right hip no trochanteric bursal tenderness mild sciatic notch tenderness tenderness over the initial  tuberosity and pubic symphysis on the right.  She is Dealer with a walker.  Specialty Comments:  No specialty comments available.  Imaging: CLINICAL DATA:  Right hip and buttock pain for 3 weeks.  EXAM: CT PELVIS WITHOUT CONTRAST  TECHNIQUE: Multidetector CT imaging of the pelvis was performed following the standard protocol without intravenous contrast.  COMPARISON:  None.  FINDINGS: Urinary Tract:  No abnormality visualized.  Bowel: Diverticulosis of colon is noted. There is no diverticulitis on visualized images.  Vascular/Lymphatic: Atherosclerosis of the vessels are identified.  Reproductive:  No  mass or other significant abnormality  Other:  None.  Musculoskeletal: There is healing fracture of the right inferior pubic rami. There is fracture with some healing in the anterior aspect of right acetabulum/lateral aspect of the right superior pubic rami. Degenerative joint changes of the spine are noted.  IMPRESSION: Healing fractures of the right pelvis as described.   Electronically Signed   By: Abelardo Diesel M.D.   On: 02/27/2018 14:04   PMFS History: Patient Active Problem List   Diagnosis Date Noted  . Primary osteoarthritis of right hip 02/07/2018  . Belching 02/01/2018  . Varicose veins 12/02/2013  . OA (osteoarthritis) 08/15/2012  . Hearing loss 08/16/2009  . Hypothyroidism 10/12/2007  . CIGARETTE SMOKER 10/09/2007  . HYPERCHOLESTEROLEMIA 10/08/2007  . COPD (chronic obstructive pulmonary disease) ([place]) 10/08/2007  . BACK PAIN, LUMBAR 10/08/2007   Past Medical History:  Diagnosis Date  . Asymptomatic varicose veins   . Cataract    beginnings  . Cigarette smoker   . COPD (chronic obstructive pulmonary disease) ([place])   . Diverticulosis of colon (without mention of hemorrhage)   . Headache(784.0)   . Hearing loss    wears hearing aides  . Lumbar back pain   . Pure hypercholesterolemia   . Stress disorder, acute   . Varicose veins     Family History  Problem Relation Age of Onset  . Diabetes Father   . Heart attack Mother 45       nonsmoker  . Colon cancer Neg Hx   . Rectal cancer Neg Hx   . Stomach cancer Neg Hx     Past Surgical History:  Procedure Laterality Date  . COLONOSCOPY  04-22-2004  . LUMBAR SPINE SURGERY     [phone]  . varicose vein treatment     at [facility]   Social History   Occupational History  . Occupation: retired    Fish farm manager: RETIRED  Tobacco Use  . Smoking status: Current Every Day Smoker    Packs/day: 0.50    Years: 40.00    Pack years: 20.00    Types: Cigarettes  . Smokeless tobacco: Never Used    . Tobacco comment: 1/2-3/4 of a pk a day   Substance and Sexual Activity  . Alcohol use: No    Alcohol/week: 0.0 standard drinks  . Drug use: No  . Sexual activity: Not on file

## 2018-03-05 NOTE — Telephone Encounter (Signed)
Pt c/o constipation.  She stated she has been taking Miralax 1 time per day. At her appointment with Dr Lorin Mercy today, he gave her gloves and told her to use her finger to check for impaction. Pt performed this and she stated she did not feel anything. Pt stated she attempted an enema but could not get the whole bottle of medication in. She stated after the enema, she did have a very small soft BM but stated it was an inch in length. Pt stated she thinks the constipation is related to her pain medication. Pt stated she was instructed to take Miralax twice per day but stated her nausea has prevented her to from doing this. Based on progress note by PCP on 03/04/18, reinforced what Dr Yong Channel recommended, Pt declined following instructions due to the nausea she is experiencing. Advised will make PCP aware. Pt agrees with plan. Answer Assessment - Initial Assessment Questions 1. STOOL PATTERN OR FREQUENCY: "How often do you pass bowel movements (BMs)?"  (Normal range: tid to q 3 days)  "When was the last BM passed?"       Was going every day  Last BM 1 inch of stool 2. STRAINING: "Do you have to strain to have a BM?"      yes 3. RECTAL PAIN: "Does your rectum hurt when the stool comes out?" If so, ask: "Do you have hemorrhoids? How bad is the pain?"  (Scale 1-10; or mild, moderate, severe)     Rectal pain- no known hemorrhoids 4. STOOL COMPOSITION: "Are the stools hard?"      no 5. BLOOD ON STOOLS: "Has there been any blood on the toilet tissue or on the surface of the BM?" If so, ask: "When was the last time?"      no 6. CHRONIC CONSTIPATION: "Is this a new problem for you?"  If no, ask: "How long have you had this problem?" (days, weeks, months)      No-  7. CHANGES IN DIET: "Have there been any recent changes in your diet?"      no 8. MEDICATIONS: "Have you been taking any new medications?"     Pain medicine 9. LAXATIVES: "Have you been using any laxatives or enemas?"  If yes, ask "What, how often, and  when was the last time?"     Miralax, enema 10. CAUSE: "What do you think is causing the constipation?"       Pain medication was taking 2 per day. 11. OTHER SYMPTOMS: "Do you have any other symptoms?" (e.g., abdominal pain, fever, vomiting) Vomited 3 days ago and has a poor appetite 12. PREGNANCY: "Is there any chance you are pregnant?" "When was your last menstrual period?"     n/a  Protocols used: CONSTIPATION-A-AH

## 2018-03-05 NOTE — Telephone Encounter (Signed)
Lets offer appointment- I can check for deeper impaction at visit tomorrow.

## 2018-03-06 ENCOUNTER — Ambulatory Visit: Payer: Medicare Other | Admitting: Family Medicine

## 2018-03-06 ENCOUNTER — Ambulatory Visit: Payer: Medicare Other | Admitting: Sports Medicine

## 2018-03-06 ENCOUNTER — Encounter: Payer: Self-pay | Admitting: Family Medicine

## 2018-03-06 VITALS — BP 120/78 | HR 70 | Temp 97.8°F

## 2018-03-06 DIAGNOSIS — K5900 Constipation, unspecified: Secondary | ICD-10-CM | POA: Diagnosis not present

## 2018-03-06 DIAGNOSIS — S32591A Other specified fracture of right pubis, initial encounter for closed fracture: Secondary | ICD-10-CM | POA: Diagnosis not present

## 2018-03-06 DIAGNOSIS — R142 Eructation: Secondary | ICD-10-CM

## 2018-03-06 NOTE — Progress Notes (Addendum)
Subjective:  [name] [name] is a 77 y.o. year old very pleasant female patient who presents for/with See problem oriented charting ROS- minimal nausea, finally having bowel movements- 4 over last 2 days now. No fever or chills. No abdominal paint oday.    Past Medical History-  Patient Active Problem List   Diagnosis Date Noted  . CIGARETTE SMOKER 10/09/2007    Priority: High  . Belching 02/01/2018    Priority: Medium  . Hypothyroidism 10/12/2007    Priority: Medium  . HYPERCHOLESTEROLEMIA 10/08/2007    Priority: Medium  . COPD (chronic obstructive pulmonary disease) ([place]) 10/08/2007    Priority: Medium  . Varicose veins 12/02/2013    Priority: Low  . OA (osteoarthritis) 08/15/2012    Priority: Low  . Hearing loss 08/16/2009    Priority: Low  . BACK PAIN, LUMBAR 10/08/2007    Priority: Low  . Primary osteoarthritis of right hip 02/07/2018    Medications- reviewed and updated Current Outpatient Medications  Medication Sig Dispense Refill  . acetaminophen (TYLENOL) 500 MG tablet Take 500 mg by mouth daily.    Marland Kitchen aspirin EC 81 MG EC tablet Take 81 mg by mouth daily.      . Cholecalciferol (VITAMIN D3 PO) Take 200 Units by mouth.    . levothyroxine (SYNTHROID, LEVOTHROID) 75 MCG tablet TAKE 1 TABLET(75 MCG) BY MOUTH DAILY 90 tablet 1  . omeprazole (PRILOSEC) 20 MG capsule Take 1 capsule (20 mg total) by mouth daily. 30 capsule 3  . ondansetron (ZOFRAN-ODT) 4 MG disintegrating tablet Take 1 tablet (4 mg total) by mouth every 8 (eight) hours as needed for nausea or vomiting. 20 tablet 0  . polyethylene glycol powder (GLYCOLAX/MIRALAX) powder Take 17 g by mouth daily. 3350 g 1   No current facility-administered medications for this visit.     Objective: BP 120/78 (BP Location: Left Arm, Patient Position: Sitting, Cuff Size: Large)   Pulse 70   Temp 97.8 F (36.6 C) (Oral)   SpO2 95%  Gen: NAD, resting comfortably, previously frequently belching, now very infrequent CV: RRR   Lungs: nonlabored, normal respiratory rate Abdomen: soft/nondistended Ext: no edema Skin: warm, dry Rectal: small very soft stool balls in rectal vault. No impaction noted. Loose stool on finger brown in color when removed.   Assessment/Plan:  Closed fracture of multiple rami of right pubis, initial encounter ([place]) Belching Constipation S:  Patient with continued issues with bowel movements.   She has not taken the miralax twice a day. On Monday only took one. On Tuesday, had visit with Dr. Lorin Mercy and he recommended self disimpaction and enema if that didn't work. Dr. Lorin Mercy is recommending no more pain pills due to related constipation issues. She has stopped taking the pain pills and states is in bad pain.  She only got one dose of miralax that day as well (not sure why she isn't taking BID miralax/ or why she is resistant to that). They were not able to get the enema to release the liquid in the rectum. Had one small bowel movement right after that.   Then had 2 more small bowel movements yesterday. This morning had almost 2 inch bowel movement which is the best she has had so far. No miralax yet today.   Belching is much better with better bowel movements- hasnt taken prilosec even and much improved. zofran/Nausea medicine was too expensive. Despite no nausea medicine- Starting to be able to eat and has not thrown  A/P: Patient seems  to really be improving off the narcotics in relation to constipation- along with daily miralax and now enema yesterday. She wants to remain off narcotics and we discussed using tylenol perhaps 500mg  q4 hours during the day. Since having BMs now will stick with once daily miralax- she is pleased to not go up on dose. Glad belching is better- can still consider PPI if needed in future. \  Addendum 03/11/18 reviewed nccsrs and only rx for current fracture is through me and Dr. Paulla Fore. Had been given short term vicodin by Dr. Caralyn Guile back in December 2018. No high risk  usage pattern.  Future Appointments  Date Time Provider [place]  04/02/2018  1:15 PM Marybelle Killings, MD [facility] None  04/18/2018  1:40 PM Gerda Diss, DO [facility]    Return precautions advised.  [name], MD

## 2018-03-06 NOTE — Telephone Encounter (Signed)
Called patient and scheduled appt for this morning.

## 2018-03-06 NOTE — Patient Instructions (Signed)
Thrilled you are doing better from constipation perspective!   Lets continue miralax 1 capful daily. I think this will be enough since you are off the strong pain medicine now. I want you to hold this for loose stools but then if you have 2-3 days without bowel movements can restart.   No fecal impaction on exam! That is great news. Glad you are not as nauseous anymore

## 2018-03-07 NOTE — Progress Notes (Signed)
Patient does have a pubic rami fracture.  Management will not change however she should come in to follow-up and discuss these results in person as soon as she can, next week is fine.  She should limit pain based on her activity level.

## 2018-03-10 DIAGNOSIS — S32591A Other specified fracture of right pubis, initial encounter for closed fracture: Secondary | ICD-10-CM | POA: Diagnosis not present

## 2018-03-11 ENCOUNTER — Telehealth (INDEPENDENT_AMBULATORY_CARE_PROVIDER_SITE_OTHER): Payer: Self-pay | Admitting: Orthopaedic Surgery

## 2018-03-11 NOTE — Progress Notes (Signed)
Could add in lidocaine patches or icy hot or biofreeze - might be tough given the area of the pain to add these though (close to sensitive areas). Not a lot of great other options. Tylenol- not helping. Nsaids- not the best for fractures (ibpurofen, aleve, etc). Tramadol- lowest level narcotic and all the rest narcotics.

## 2018-03-11 NOTE — Telephone Encounter (Signed)
I called discussed .talked times 20 minutes

## 2018-03-11 NOTE — Telephone Encounter (Signed)
I called and spoke with patient. She states that she completely stopped all of the prescribed pain medication and has been feeling better. She has managed to have very small bowel movements every day.  She is now having increased pain across the lower back and is unable to rest at night. Her PCP explained that she could take tylenol and she had taken up to 4000mg  in a 24 hour period. She is now taking 2000mg  per day.  She would like to know if there is something that you could recommend to help her in regards to the lower back pain she is having.

## 2018-03-11 NOTE — Telephone Encounter (Signed)
Patient called would like to speak with [facility]. Im not sure what the call is pertaining to patient stated her question was to long, preferred to wait until she spoke with [facility].

## 2018-03-11 NOTE — Progress Notes (Signed)
Patient states that Tylenol is not helping her back and she is not sleeping.  She also states that Dr. Lorin Mercy told her to stop narcotics; the only thing she's been taking is Tylenol.  She has been taking 500mg  of Tylenol q 4 hrs around the clock.  Please advise.

## 2018-03-12 NOTE — Progress Notes (Signed)
Called and spoke with patient.  Advised her that Dr. Yong Channel concurs with Dr, Lorin Mercy and is fine with patient taking the Aleve and Ibuprofen for her discomfort.  Advised pt to call back with questions/concerns.  She verbalized understanding.

## 2018-03-12 NOTE — Progress Notes (Signed)
Called and spoke with patient and reviewed Dr. Ansel Bong suggestion with her.  Pt states that she spoke with Dr. Lorin Mercy last evening and states that he told her that she could take 2 Aleve this morning and then 2 Ibuprofen this evening.  Upon review of Dr. Lorin Mercy telephone encounter, there is nothing specific regarding instructing patient to take Aleve or Ibuprofen.  Advised patient per Dr. Yong Channel that nsaids were not the best choice given her recent fracture.  Advised I would run this by Dr. Yong Channel and call her back with further advice.  Pt verbalized understanding.

## 2018-03-12 NOTE — Progress Notes (Signed)
If Dr. Lorin Mercy told her that- I do not have an issue with her trying that.

## 2018-03-14 ENCOUNTER — Telehealth: Payer: Self-pay | Admitting: Family Medicine

## 2018-03-14 NOTE — Telephone Encounter (Signed)
See note

## 2018-03-14 NOTE — Telephone Encounter (Signed)
Copied from [place] [id number]. Topic: Quick Communication - Rx Refill/Question >> Mar 14, 2018  8:52 AM Oliver Pila B wrote: Medication: pt called b/c she has been taking aleve for a condition; pt is asking to stop aleve and switch to Ibuprofin; pt request to hear back by a 9:45am; contact to advise

## 2018-03-15 NOTE — Telephone Encounter (Signed)
Spoke to pt told her can take Ibuprofen instead of Aleve and can use Tylenol in between if needed, okay per Dr. Yong Channel. Pt verbalized understanding.

## 2018-03-20 ENCOUNTER — Telehealth: Payer: Self-pay | Admitting: *Deleted

## 2018-03-20 ENCOUNTER — Telehealth (INDEPENDENT_AMBULATORY_CARE_PROVIDER_SITE_OTHER): Payer: Self-pay | Admitting: Orthopaedic Surgery

## 2018-03-20 NOTE — Telephone Encounter (Signed)
noted 

## 2018-03-20 NOTE — Telephone Encounter (Signed)
Please advise 

## 2018-03-20 NOTE — Telephone Encounter (Signed)
Copied from [place] [id number]. Topic: Appointment Scheduling - Scheduling Inquiry for Clinic >> Mar 20, 2018  4:41 PM Berneta Levins wrote: Reason for CRM:   Pt states she was referred to Dr. Lorin Mercy by Dr. Yong Channel and she was told by Dr. Lorin Mercy to have Dr. Yong Channel check her electrolytes in her stomach by xray.   Pt wants to know about what that means and how to get that done. Pt can be reached at [phone]

## 2018-03-20 NOTE — Telephone Encounter (Signed)
I called. Had long 8 inch plus skinny bowel movement. Off miralax now. Using tylenol. Has to stand to urinate. Fells like she has to go frequently without dysuria. . Off narcotics now. She had some vomiting last week. Hx of UTI.  I told her to call Dr, Yong Channel , likely needs lytes checked and KUB to make sure she does not have dilated loops of bowel. FYI

## 2018-03-20 NOTE — Telephone Encounter (Signed)
May do better sleeping in recliner until Fx heals then pain will be gone.

## 2018-03-20 NOTE — Telephone Encounter (Signed)
Patient called to let Dr Lorin Mercy know she is still in a lot of pain in her lower back. Patient said she have not been able to sleep at night. Patient said she do not know what to do. Patient said she has not been able to sit down. Patient said she is sitting in her chair all night. Patient said she can not lay down in her bed. The number to contact patient is [phone]

## 2018-03-20 NOTE — Telephone Encounter (Signed)
FYI  Patient's son, [name] [name], called and states that patient is not getting any sleep. She is sleeping in a recliner and cannot rest at all. She is having to wear depends and cannot make it to the bathroom in time. She has been vomiting what little bit she does eat and is losing weight. Her son feels that she needs to be admitted to the hospital as she seems to be deteriorating.    I advised her son we would recommend going to the ED and having her evaluated, most especially if she is not eating, vomiting, and seems to not have her "mind working right".  He expressed understanding.  I did inform son Dr. Lorin Mercy is in surgery this afternoon, but that I would send message to make him aware.

## 2018-03-21 ENCOUNTER — Ambulatory Visit: Payer: Self-pay | Admitting: *Deleted

## 2018-03-21 ENCOUNTER — Ambulatory Visit (INDEPENDENT_AMBULATORY_CARE_PROVIDER_SITE_OTHER): Payer: Medicare Other

## 2018-03-21 ENCOUNTER — Encounter: Payer: Self-pay | Admitting: Family Medicine

## 2018-03-21 ENCOUNTER — Ambulatory Visit: Payer: Medicare Other | Admitting: Family Medicine

## 2018-03-21 VITALS — BP 115/70 | HR 65 | Temp 97.9°F

## 2018-03-21 DIAGNOSIS — R358 Other polyuria: Secondary | ICD-10-CM | POA: Diagnosis not present

## 2018-03-21 DIAGNOSIS — R3589 Other polyuria: Secondary | ICD-10-CM

## 2018-03-21 DIAGNOSIS — E034 Atrophy of thyroid (acquired): Secondary | ICD-10-CM | POA: Diagnosis not present

## 2018-03-21 DIAGNOSIS — S32501A Unspecified fracture of right pubis, initial encounter for closed fracture: Secondary | ICD-10-CM | POA: Diagnosis not present

## 2018-03-21 DIAGNOSIS — K5900 Constipation, unspecified: Secondary | ICD-10-CM

## 2018-03-21 DIAGNOSIS — F17200 Nicotine dependence, unspecified, uncomplicated: Secondary | ICD-10-CM | POA: Diagnosis not present

## 2018-03-21 NOTE — Patient Instructions (Addendum)
Start omeprazole 20mg  back- can actually do this before dinner perhaps 30 minutes  Restart thyroid medicine in the morning  Call back in the morning or afternoon  to get onto a lab visit - we need labs tomorrow  Continue 500mg  of tylenol- 2 pills twice a day spaced by 8 hours  Can use a single aleve before bed to see if that will help you sleep better

## 2018-03-21 NOTE — Telephone Encounter (Signed)
Patient has an appt today.

## 2018-03-21 NOTE — Telephone Encounter (Signed)
Called patient to ensure she was aware that her appt is at 4:00 with Dr Yong Channel today.

## 2018-03-21 NOTE — Telephone Encounter (Signed)
Contacted pt's husband, Mel,  because she is having pain all over and that are not relieved by tylenol or aleve; he also says that the pt has not had a BM in 3 days; he also says that he needs help taking care of her; reviewed recommendations per Dr Keitha Butte; recommendations also made per nurse triage protocol; the pt is normally seen by Dr Yong Channel, but she and her husband do not want to wait for Dr Ansel Bong next appointment 03/21/18 at 1600; they are offered and accepted appointment with Dr Orma Flaming, [facility], 03/21/18; they verbalize understanding; spoke with [facility] regarding this appointment; will route to office for notification of this upcoming.   Reason for Disposition . [1] MODERATE back pain (e.g., interferes with normal activities) AND [2] present > 3 days  Answer Assessment - Initial Assessment Questions 1. ONSET: "When did the pain begin?"     Over a month ago 2. LOCATION: "Where does it hurt?" (upper, mid or lower back)     Greatest in lower back 3. SEVERITY: "How bad is the pain?"  (e.g., Scale 1-10; mild, moderate, or severe)   - MILD (1-3): doesn't interfere with normal activities    - MODERATE (4-7): interferes with normal activities or awakens from sleep    - SEVERE (8-10): excruciating pain, unable to do any normal activities      severe 4. PATTERN: "Is the pain constant?" (e.g., yes, no; constant, intermittent)      constant 5. RADIATION: "Does the pain shoot into your legs or elsewhere?"     no 6. CAUSE:  "What do you think is causing the back pain?"      ongoing 7. BACK OVERUSE:  "Any recent lifting of heavy objects, strenuous work or exercise?"     no 8. MEDICATIONS: "What have you taken so far for the pain?" (e.g., nothing, acetaminophen, NSAIDS)     Tylenol and aleve 9. NEUROLOGIC SYMPTOMS: "Do you have any weakness, numbness, or problems with bowel/bladder control?"     no 10. OTHER SYMPTOMS: "Do you have any other symptoms?" (e.g., fever,  abdominal pain, burning with urination, blood in urine)       no 11. PREGNANCY: "Is there any chance you are pregnant?" (e.g., yes, no; LMP)       no  Protocols used: BACK PAIN-A-AH

## 2018-03-21 NOTE — Telephone Encounter (Signed)
Spoke with Cassie, Clinical Lead at [place] regarding upcoming appointment for Dr Rogers Blocker; she says that she will check with Dr Rogers Blocker to see if the provider will see Dr Ansel Bong patient; Cassie also says that they will contact the pt if rescheduling with Dr Yong Channel is required; pt's contact number verified.

## 2018-03-21 NOTE — Telephone Encounter (Signed)
See note

## 2018-03-21 NOTE — Progress Notes (Signed)
Subjective:  [name] [name] is a 77 y.o. year old very pleasant female patient who presents for/with See problem oriented charting ROS- complains of fatigue, poor stability, constipation, belching but overall improved, low back pain, pelvic pain   Past Medical History-  Patient Active Problem List   Diagnosis Date Noted  . CIGARETTE SMOKER 10/09/2007    Priority: High  . Belching 02/01/2018    Priority: Medium  . Hypothyroidism 10/12/2007    Priority: Medium  . HYPERCHOLESTEROLEMIA 10/08/2007    Priority: Medium  . COPD (chronic obstructive pulmonary disease) ([place]) 10/08/2007    Priority: Medium  . Varicose veins 12/02/2013    Priority: Low  . OA (osteoarthritis) 08/15/2012    Priority: Low  . Hearing loss 08/16/2009    Priority: Low  . BACK PAIN, LUMBAR 10/08/2007    Priority: Low  . Hyponatremia 03/23/2018  . Debility 03/23/2018  . Pubic ramus fracture ([place]) 03/23/2018  . Fall 03/23/2018  . Urinary retention 03/23/2018  . Primary osteoarthritis of right hip 02/07/2018    Medications- reviewed and updated No current facility-administered medications for this visit.    No current outpatient medications on file.   Facility-Administered Medications Ordered in Other Visits  Medication Dose Route Frequency Provider Last Rate Last Dose  . acetaminophen (TYLENOL) tablet 650 mg  650 mg Oral Q6H PRN Toy Baker, MD       Or  . acetaminophen (TYLENOL) suppository 650 mg  650 mg Rectal Q6H PRN Doutova, [name], MD      . HYDROcodone-acetaminophen (NORCO/VICODIN) 5-325 MG per tablet 1-2 tablet  1-2 tablet Oral Q4H PRN Toy Baker, MD   1 tablet at 03/23/18 1046  . levothyroxine (SYNTHROID, LEVOTHROID) tablet 75 mcg  75 mcg Oral QAC breakfast Toy Baker, MD   75 mcg at 03/23/18 1046  . ondansetron (ZOFRAN) tablet 4 mg  4 mg Oral Q6H PRN Toy Baker, MD       Or  . ondansetron (ZOFRAN) injection 4 mg  4 mg Intravenous Q6H PRN Doutova,  [name], MD      . polyethylene glycol (MIRALAX / GLYCOLAX) packet 17 g  17 g Oral Daily PRN Doutova, [name], MD      . senna (SENOKOT) tablet 8.6 mg  1 tablet Oral BID Doutova, [name], MD   8.6 mg at 03/23/18 1046  . traMADol (ULTRAM) tablet 50 mg  50 mg Oral Q6H PRN Eudelia Bunch, RPH        Objective: BP 115/70 (BP Location: Left Arm, Patient Position: Sitting, Cuff Size: Large)   Pulse 65   Temp 97.9 F (36.6 C) (Oral)   SpO2 96%  Gen: NAD, resting comfortably Mucus membranes are moist CV: RRR no murmurs rubs or gallops Lungs: CTAB no crackles, wheeze, rhonchi Abdomen: soft/nontender/nondistended/normal bowel sounds. No rebound or guarding.  Msk: pain with palpation over lower back diffusely- no midline pain reported Ext: no edema Skin: warm, dry   Assessment/Plan:  Constipation, unspecified constipation type - Plan: DG Abd 1 View  Hypothyroidism due to acquired atrophy of thyroid - Plan: TSH, Comprehensive metabolic panel, CBC.  Polyuria - Plan: POCT Urinalysis Dipstick (Automated) S:  on September 18th went home and had 2 days worth of significant clean out of bowels- frequent bowel movements. Stool was not thin- was diarrhea. Reviewed colonoscopy from 2016 which did not show concern for malignancy- did have diverticulosis as well as redundant tortuous colon.   Had small thin pieces for several days. Had 2 more episodes where  had very loose bowel movements- last one was 1 week ago or 10 days. Since that time has had small strips of stool also has gotten some larger balls of stool.   Throwing up when takes a pill with any kind of food. Cant take a pill after food because food feels stuck and throws up. Last few days has not eaten well and now not having many bowel movements. Has been able to drink water.   Not hurting in pelvis anymore. Has pain in low back if lays in bed and due to weakness hard to get out of the bed so has been sleeping in a chair but the  position in the chair worsens her back pain.  For pain on  2 tylenol twice a day but she doesn't think this is adequate.   Every hour feels like she has to urinate. The only way she can pee is to go outside and lean in certain position (just in last 2 days). Cant urinate without standing up per er report and this has been very stressful on family as they help her to go urinate every hour- husband over age 75.   For unclear reasons she is not taking omeprazole or thyroid pill  Family feels she is not thinking as clearly- seems tangential- they assumed due to the sleep deprivation  A/P: Son and husband with patient and they are very concerned due to sleep deprivation due to pain which leads to seemingly weakness and confusion during the day. She is also having frequent urination which is concerning as well as infrequent BMs again. She seems to have trouble swallowing food and has a raspy cough  From avs plan  "Start omeprazole 20mg  back- can actually do this before dinner perhaps 30 minutes  Restart thyroid medicine in the morning  Call back in the morning or afternoon  to get onto a lab visit - we need labs tomorrow  Continue 500mg  of tylenol- 2 pills twice a day spaced by 8 hours  Can use a single aleve before bed to see if that will help you sleep better"  We also discussed stool softener over miralax now to see if that is easier on her/more gentle.   With her poor PO- we discussed need for labs but at conclusion of our visit lab had already left the building so she had to come back for labs. She was unable to urinate when she came back. Unfortunately, sodium found to be at 118 when labs return and patient directed to hospital. This could explain her confusion and weakness at least partially.  I spoke with her today (Saturday) and she feels somewhat better at this point- thanked her for following through with our on call physician Dr. Lillie Fragmin instructions.   CIGARETTE SMOKER We discussed  importance of smoking cessation. Allergic to nicotine patches. Has not tried the gum.    Future Appointments  Date Time Provider [place]  04/02/2018  1:15 PM Marybelle Killings, MD [facility] None  04/18/2018  1:40 PM Gerda Diss, DO [facility]   No follow-ups on file.  Lab/Order associations: Constipation, unspecified constipation type - Plan: DG Abd 1 View  Hypothyroidism due to acquired atrophy of thyroid - Plan: TSH, Comprehensive metabolic panel, CBC, CANCELED: CBC, CANCELED: Comprehensive metabolic panel, CANCELED: TSH  Polyuria - Plan: POCT Urinalysis Dipstick (Automated)  CIGARETTE SMOKER  Time Stamp The duration of face-to-face time during this visit was greater than 40 minutes (5:04-5:45 PM). Greater than 50% of  this time was spent in counseling, explanation of diagnosis, planning of further management, and/or coordination of care including counseling family and patient as they are distressed from her lack of improvement, discussing labs and next steps in treatment, counseling on potential need for ER trip.    Return precautions advised.  [name], MD

## 2018-03-22 ENCOUNTER — Telehealth: Payer: Self-pay | Admitting: Family Medicine

## 2018-03-22 ENCOUNTER — Other Ambulatory Visit (INDEPENDENT_AMBULATORY_CARE_PROVIDER_SITE_OTHER): Payer: Medicare Other

## 2018-03-22 DIAGNOSIS — E034 Atrophy of thyroid (acquired): Secondary | ICD-10-CM

## 2018-03-22 NOTE — Telephone Encounter (Signed)
Pt Husband want to see about getting help with home healthcare.

## 2018-03-23 ENCOUNTER — Emergency Department (HOSPITAL_COMMUNITY): Payer: Medicare Other

## 2018-03-23 ENCOUNTER — Encounter: Payer: Self-pay | Admitting: Family Medicine

## 2018-03-23 ENCOUNTER — Observation Stay (HOSPITAL_COMMUNITY): Payer: Medicare Other

## 2018-03-23 ENCOUNTER — Other Ambulatory Visit: Payer: Self-pay

## 2018-03-23 ENCOUNTER — Telehealth: Payer: Self-pay | Admitting: Family Medicine

## 2018-03-23 ENCOUNTER — Encounter (HOSPITAL_COMMUNITY): Payer: Self-pay

## 2018-03-23 ENCOUNTER — Inpatient Hospital Stay (HOSPITAL_COMMUNITY)
Admission: EM | Admit: 2018-03-23 | Discharge: 2018-03-27 | DRG: 641 | Disposition: A | Discharge status: Skilled Nursing Facility | Payer: Medicare Other | Attending: Family Medicine | Admitting: Family Medicine

## 2018-03-23 DIAGNOSIS — R531 Weakness: Secondary | ICD-10-CM | POA: Diagnosis not present

## 2018-03-23 DIAGNOSIS — M1990 Unspecified osteoarthritis, unspecified site: Secondary | ICD-10-CM | POA: Diagnosis not present

## 2018-03-23 DIAGNOSIS — R748 Abnormal levels of other serum enzymes: Secondary | ICD-10-CM | POA: Diagnosis not present

## 2018-03-23 DIAGNOSIS — R54 Age-related physical debility: Secondary | ICD-10-CM | POA: Diagnosis not present

## 2018-03-23 DIAGNOSIS — F17210 Nicotine dependence, cigarettes, uncomplicated: Secondary | ICD-10-CM | POA: Diagnosis not present

## 2018-03-23 DIAGNOSIS — R682 Dry mouth, unspecified: Secondary | ICD-10-CM | POA: Diagnosis present

## 2018-03-23 DIAGNOSIS — R5381 Other malaise: Secondary | ICD-10-CM | POA: Diagnosis present

## 2018-03-23 DIAGNOSIS — E871 Hypo-osmolality and hyponatremia: Secondary | ICD-10-CM | POA: Diagnosis not present

## 2018-03-23 DIAGNOSIS — Z9114 Patient's other noncompliance with medication regimen: Secondary | ICD-10-CM | POA: Diagnosis not present

## 2018-03-23 DIAGNOSIS — Z79899 Other long term (current) drug therapy: Secondary | ICD-10-CM | POA: Diagnosis not present

## 2018-03-23 DIAGNOSIS — E7800 Pure hypercholesterolemia, unspecified: Secondary | ICD-10-CM | POA: Diagnosis not present

## 2018-03-23 DIAGNOSIS — S32501D Unspecified fracture of right pubis, subsequent encounter for fracture with routine healing: Secondary | ICD-10-CM | POA: Diagnosis not present

## 2018-03-23 DIAGNOSIS — K5909 Other constipation: Secondary | ICD-10-CM | POA: Diagnosis not present

## 2018-03-23 DIAGNOSIS — W19XXXS Unspecified fall, sequela: Secondary | ICD-10-CM | POA: Diagnosis not present

## 2018-03-23 DIAGNOSIS — R339 Retention of urine, unspecified: Secondary | ICD-10-CM | POA: Diagnosis not present

## 2018-03-23 DIAGNOSIS — G4700 Insomnia, unspecified: Secondary | ICD-10-CM | POA: Diagnosis present

## 2018-03-23 DIAGNOSIS — M6281 Muscle weakness (generalized): Secondary | ICD-10-CM | POA: Diagnosis not present

## 2018-03-23 DIAGNOSIS — Z888 Allergy status to other drugs, medicaments and biological substances status: Secondary | ICD-10-CM | POA: Diagnosis not present

## 2018-03-23 DIAGNOSIS — H9190 Unspecified hearing loss, unspecified ear: Secondary | ICD-10-CM | POA: Diagnosis present

## 2018-03-23 DIAGNOSIS — Z720 Tobacco use: Secondary | ICD-10-CM

## 2018-03-23 DIAGNOSIS — I10 Essential (primary) hypertension: Secondary | ICD-10-CM | POA: Diagnosis not present

## 2018-03-23 DIAGNOSIS — I868 Varicose veins of other specified sites: Secondary | ICD-10-CM | POA: Diagnosis not present

## 2018-03-23 DIAGNOSIS — R2689 Other abnormalities of gait and mobility: Secondary | ICD-10-CM | POA: Diagnosis not present

## 2018-03-23 DIAGNOSIS — W19XXXA Unspecified fall, initial encounter: Secondary | ICD-10-CM | POA: Diagnosis present

## 2018-03-23 DIAGNOSIS — K219 Gastro-esophageal reflux disease without esophagitis: Secondary | ICD-10-CM | POA: Diagnosis not present

## 2018-03-23 DIAGNOSIS — Z79891 Long term (current) use of opiate analgesic: Secondary | ICD-10-CM

## 2018-03-23 DIAGNOSIS — E039 Hypothyroidism, unspecified: Secondary | ICD-10-CM | POA: Diagnosis present

## 2018-03-23 DIAGNOSIS — R140 Abdominal distension (gaseous): Secondary | ICD-10-CM | POA: Diagnosis not present

## 2018-03-23 DIAGNOSIS — R8271 Bacteriuria: Secondary | ICD-10-CM | POA: Diagnosis not present

## 2018-03-23 DIAGNOSIS — M2550 Pain in unspecified joint: Secondary | ICD-10-CM | POA: Diagnosis not present

## 2018-03-23 DIAGNOSIS — S32599D Other specified fracture of unspecified pubis, subsequent encounter for fracture with routine healing: Secondary | ICD-10-CM

## 2018-03-23 DIAGNOSIS — J449 Chronic obstructive pulmonary disease, unspecified: Secondary | ICD-10-CM | POA: Diagnosis not present

## 2018-03-23 DIAGNOSIS — M169 Osteoarthritis of hip, unspecified: Secondary | ICD-10-CM | POA: Diagnosis not present

## 2018-03-23 DIAGNOSIS — Z7401 Bed confinement status: Secondary | ICD-10-CM | POA: Diagnosis not present

## 2018-03-23 DIAGNOSIS — S32599A Other specified fracture of unspecified pubis, initial encounter for closed fracture: Secondary | ICD-10-CM | POA: Diagnosis present

## 2018-03-23 DIAGNOSIS — R0902 Hypoxemia: Secondary | ICD-10-CM | POA: Diagnosis not present

## 2018-03-23 DIAGNOSIS — S32509D Unspecified fracture of unspecified pubis, subsequent encounter for fracture with routine healing: Secondary | ICD-10-CM | POA: Diagnosis not present

## 2018-03-23 DIAGNOSIS — K5900 Constipation, unspecified: Secondary | ICD-10-CM | POA: Diagnosis not present

## 2018-03-23 LAB — COMPREHENSIVE METABOLIC PANEL
AG Ratio: 1.6 (calc) (ref 1.0–2.5)
ALT: 14 U/L (ref 6–29)
ALT: 17 U/L (ref 0–44)
ANION GAP: 11 (ref 5–15)
AST: 24 U/L (ref 10–35)
AST: 30 U/L (ref 15–41)
Albumin: 3.9 g/dL (ref 3.6–5.1)
Albumin: 3.7 g/dL (ref 3.5–5.0)
Alkaline Phosphatase: 198 U/L — ABNORMAL HIGH (ref 38–126)
Alkaline phosphatase (APISO): 204 U/L — ABNORMAL HIGH (ref 33–130)
BILIRUBIN TOTAL: 0.3 mg/dL (ref 0.3–1.2)
BUN: 12 mg/dL (ref 7–25)
BUN: 12 mg/dL (ref 8–23)
CALCIUM: 9.2 mg/dL (ref 8.9–10.3)
CO2: 22 mmol/L (ref 20–32)
CO2: 23 mmol/L (ref 22–32)
CREATININE: 0.71 mg/dL (ref 0.60–0.93)
Calcium: 9.2 mg/dL (ref 8.6–10.4)
Chloride: 84 mmol/L — ABNORMAL LOW (ref 98–110)
Chloride: 87 mmol/L — ABNORMAL LOW (ref 98–111)
Creatinine, Ser: 0.61 mg/dL (ref 0.44–1.00)
GLUCOSE: 90 mg/dL (ref 65–99)
Globulin: 2.4 g/dL (calc) (ref 1.9–3.7)
Glucose, Bld: 105 mg/dL — ABNORMAL HIGH (ref 70–99)
Potassium: 4.2 mmol/L (ref 3.5–5.3)
Potassium: 3.6 mmol/L (ref 3.5–5.1)
Sodium: 118 mmol/L — CL (ref 135–146)
Sodium: 121 mmol/L — ABNORMAL LOW (ref 135–145)
TOTAL PROTEIN: 6.8 g/dL (ref 6.5–8.1)
Total Bilirubin: 0.5 mg/dL (ref 0.2–1.2)
Total Protein: 6.3 g/dL (ref 6.1–8.1)

## 2018-03-23 LAB — BASIC METABOLIC PANEL
ANION GAP: 11 (ref 5–15)
Anion gap: 10 (ref 5–15)
BUN: 8 mg/dL (ref 8–23)
BUN: 15 mg/dL (ref 8–23)
CO2: 24 mmol/L (ref 22–32)
CO2: 23 mmol/L (ref 22–32)
Calcium: 9.8 mg/dL (ref 8.9–10.3)
Calcium: 9.3 mg/dL (ref 8.9–10.3)
Chloride: 93 mmol/L — ABNORMAL LOW (ref 98–111)
Chloride: 94 mmol/L — ABNORMAL LOW (ref 98–111)
Creatinine, Ser: 0.7 mg/dL (ref 0.44–1.00)
Creatinine, Ser: 0.76 mg/dL (ref 0.44–1.00)
GFR calc Af Amer: >60 mL/min (ref >60)
GFR calc Af Amer: >60 mL/min (ref >60)
GFR calc non Af Amer: >60 mL/min (ref >60)
GFR calc non Af Amer: >60 mL/min (ref >60)
Glucose, Bld: 99 mg/dL (ref 70–99)
Glucose, Bld: 130 mg/dL — ABNORMAL HIGH (ref 70–99)
POTASSIUM: 3.5 mmol/L (ref 3.5–5.1)
POTASSIUM: 3.4 mmol/L — AB (ref 3.5–5.1)
SODIUM: 127 mmol/L — AB (ref 135–145)
Sodium: 128 mmol/L — ABNORMAL LOW (ref 135–145)

## 2018-03-23 LAB — URINALYSIS, ROUTINE W REFLEX MICROSCOPIC
BILIRUBIN URINE: NEGATIVE
Glucose, UA: NEGATIVE mg/dL
HGB URINE DIPSTICK: NEGATIVE
KETONES UR: NEGATIVE mg/dL
NITRITE: NEGATIVE
PH: 7 (ref 5.0–8.0)
Protein, ur: NEGATIVE mg/dL
Specific Gravity, Urine: 1.002 — ABNORMAL LOW (ref 1.005–1.030)

## 2018-03-23 LAB — CBC WITH DIFFERENTIAL/PLATELET
BASOS PCT: 0 %
Basophils Absolute: 0 10*3/uL (ref 0.0–0.1)
EOS ABS: 0 10*3/uL (ref 0.0–0.7)
Eosinophils Relative: 0 %
HCT: 34.2 % — ABNORMAL LOW (ref 36.0–46.0)
HEMOGLOBIN: 12.3 g/dL (ref 12.0–15.0)
Lymphocytes Relative: 11 %
Lymphs Abs: 1.1 10*3/uL (ref 0.7–4.0)
MCH: 29.4 pg (ref 26.0–34.0)
MCHC: 36 g/dL (ref 30.0–36.0)
MCV: 81.6 fL (ref 78.0–100.0)
MONO ABS: 0.8 10*3/uL (ref 0.1–1.0)
Monocytes Relative: 8 %
NEUTROS ABS: 8 10*3/uL — AB (ref 1.7–7.7)
NEUTROS PCT: 81 %
Platelets: 383 10*3/uL (ref 150–400)
RBC: 4.19 MIL/uL (ref 3.87–5.11)
RDW: 12.8 % (ref 11.5–15.5)
WBC: 9.9 10*3/uL (ref 4.0–10.5)

## 2018-03-23 LAB — CBC
HCT: 34 % — ABNORMAL LOW (ref 35.0–45.0)
Hemoglobin: 12.1 g/dL (ref 11.7–15.5)
MCH: 28.9 pg (ref 27.0–33.0)
MCHC: 35.6 g/dL (ref 32.0–36.0)
MCV: 81.1 fL (ref 80.0–100.0)
MPV: 9.3 fL (ref 7.5–12.5)
PLATELETS: 398 10*3/uL (ref 140–400)
RBC: 4.19 10*6/uL (ref 3.80–5.10)
RDW: 12.8 % (ref 11.0–15.0)
WBC: 7 10*3/uL (ref 3.8–10.8)

## 2018-03-23 LAB — SODIUM, URINE, RANDOM: Sodium, Ur: 31 mmol/L

## 2018-03-23 LAB — T4, FREE: Free T4: 1.16 ng/dL (ref 0.82–1.77)

## 2018-03-23 LAB — TSH
TSH: 9.89 m[IU]/L — AB (ref 0.40–4.50)
TSH: 6.213 u[IU]/mL — ABNORMAL HIGH (ref 0.350–4.500)

## 2018-03-23 LAB — OSMOLALITY: Osmolality: 249 mOsm/kg — CL (ref 275–295)

## 2018-03-23 LAB — CREATININE, URINE, RANDOM: Creatinine, Urine: 8.43 mg/dL

## 2018-03-23 LAB — OSMOLALITY, URINE: Osmolality, Ur: 112 mOsm/kg — ABNORMAL LOW (ref 300–900)

## 2018-03-23 MED ORDER — LEVOTHYROXINE SODIUM 50 MCG PO TABS
75.0000 ug | ORAL_TABLET | Freq: Every day | ORAL | Status: DC
  Administered 2018-03-23 – 2018-03-27 (×5): 75 ug via ORAL
  Filled 2018-03-23 (×5): qty 1

## 2018-03-23 MED ORDER — TRAMADOL HCL 50 MG PO TABS: 50.0000 mg | ORAL_TABLET | Freq: Four times a day (QID) | ORAL | Status: DC | PRN

## 2018-03-23 MED ORDER — PANTOPRAZOLE SODIUM 40 MG PO TBEC
40.0000 mg | DELAYED_RELEASE_TABLET | Freq: Every day | ORAL | Status: DC
  Administered 2018-03-23 – 2018-03-27 (×5): 40 mg via ORAL
  Filled 2018-03-23 (×6): qty 1

## 2018-03-23 MED ORDER — TRAMADOL HCL 50 MG PO TABS
50.0000 mg | ORAL_TABLET | Freq: Four times a day (QID) | ORAL | Status: DC | PRN
  Administered 2018-03-25 – 2018-03-26 (×2): 50 mg via ORAL
  Filled 2018-03-23 (×2): qty 1

## 2018-03-23 MED ORDER — ONDANSETRON HCL 4 MG/2ML IJ SOLN
4.0000 mg | Freq: Four times a day (QID) | INTRAMUSCULAR | Status: DC | PRN
  Filled 2018-03-23: qty 2

## 2018-03-23 MED ORDER — TRAZODONE HCL 50 MG PO TABS
50.0000 mg | ORAL_TABLET | Freq: Once | ORAL | Status: AC
  Administered 2018-03-23: 50 mg via ORAL
  Filled 2018-03-23: qty 1

## 2018-03-23 MED ORDER — HYDROCODONE-ACETAMINOPHEN 5-325 MG PO TABS
1.0000 | ORAL_TABLET | ORAL | Status: DC | PRN
  Administered 2018-03-23: 1 via ORAL
  Administered 2018-03-23 – 2018-03-24 (×2): 2 via ORAL
  Filled 2018-03-23: qty 1
  Filled 2018-03-23 (×2): qty 2

## 2018-03-23 MED ORDER — ACETAMINOPHEN 325 MG PO TABS: 650.0000 mg | ORAL_TABLET | Freq: Four times a day (QID) | ORAL | Status: DC | PRN

## 2018-03-23 MED ORDER — ONDANSETRON HCL 4 MG PO TABS: 4.0000 mg | ORAL_TABLET | Freq: Four times a day (QID) | ORAL | Status: DC | PRN

## 2018-03-23 MED ORDER — SODIUM CHLORIDE 0.9 % IV BOLUS
1000.0000 mL | Freq: Once | INTRAVENOUS | Status: AC
  Administered 2018-03-23: 1000 mL via INTRAVENOUS

## 2018-03-23 MED ORDER — ACETAMINOPHEN 650 MG RE SUPP: 650.0000 mg | Freq: Four times a day (QID) | RECTAL | Status: DC | PRN

## 2018-03-23 MED ORDER — BISACODYL 10 MG RE SUPP
10.0000 mg | Freq: Every day | RECTAL | Status: DC | PRN
  Administered 2018-03-25: 10 mg via RECTAL
  Filled 2018-03-23: qty 1

## 2018-03-23 MED ORDER — SENNA 8.6 MG PO TABS
1.0000 | ORAL_TABLET | Freq: Two times a day (BID) | ORAL | Status: DC
  Administered 2018-03-23 – 2018-03-26 (×7): 8.6 mg via ORAL
  Filled 2018-03-23 (×7): qty 1

## 2018-03-23 MED ORDER — POLYETHYLENE GLYCOL 3350 17 G PO PACK: 17.0000 g | PACK | Freq: Every day | ORAL | Status: DC | PRN

## 2018-03-23 NOTE — ED Triage Notes (Signed)
Pt was brought by [facility] due to an abnormal lab. Pt states that her PCP called her this morning and told her that her sodium was "dangerously low" and she needed to go to the hospital. Per EMS, pt c/o polyuria, weakness, and tremors x few days. Pt is following up with her urologist for her polyuria on Monday.

## 2018-03-23 NOTE — Telephone Encounter (Signed)
Received call from lab at 2:20 am regarding critical sodium level Called home number listed but no answer, I did LMOM that Taniesha should go to the ER now for evaluation and correction.   Called son Abe People, phone straight to VM Also called son Eden Lathe and [facility] with this info

## 2018-03-23 NOTE — Telephone Encounter (Signed)
Checked pts chart and she is at the ER being evaluated for her hyponatremia

## 2018-03-23 NOTE — ED Notes (Signed)
Coke given to pt.

## 2018-03-23 NOTE — ED Notes (Signed)
Bed: DY51 Expected date:  Expected time:  Means of arrival:  Comments: 22 F hyponatremia

## 2018-03-23 NOTE — ED Notes (Signed)
Contacted lab to add on labs.

## 2018-03-23 NOTE — Assessment & Plan Note (Signed)
We discussed importance of smoking cessation. Allergic to nicotine patches. Has not tried the gum.

## 2018-03-23 NOTE — H&P (Addendum)
KATHARIN SCHNEIDER STM:[id number] DOB: September 09, 1940 DOA: 03/23/2018     PCP: Marin Olp, MD   Outpatient Specialists:   [place] Patient arrived to ER on 03/23/18 at [place]  Patient coming from: home Lives  With family    Chief Complaint:  Chief Complaint  Patient presents with  . Abnormal Labs    HPI: [name] [name] is a 77 y.o. female with medical history significant of COPD, tobacco abuse, hypothyroidism    Presented with   Had recent Pubic Ramus fracture was given opioids and developed severe constipation opioids were stopped now constipation has resolved.  She continues to be in pain. Has trouble ambulating. Has been thirsty. Urinating just a small amount.  Yesterday she lost balance and scraped her elbow. Reports no BM in 5 days. She was told to take stool softener but she has not.  She still smokes. Needs assist with ADL's lives with her Husband who is 31 Yo she has very hard time ambulating.  Reports still have severe pain in the bottom of her back. She has been drinking A LOT of water because her mouth was so dry. She has been eating very little bc her husband does not cook and she was unable.  Her abdomen has been distended and hurting at the bottom She has been taking Tylenol and Advil for pain with minimal improvement. States she usually does not take her medications but took her synthroid for the first time yesterday.   She has hardly slepped. Denies any recent head injury Reports no fever no chills no chest pain  She went to get blood work at PCP and was called 2 days ago that her Na was low 118. Today she called 911 to come to the ER   Regarding pertinent Chronic problems: Hypothyroidism on syndroid   While in ER: Na 121  CT abd mild stool burden The following Work up has been ordered so far:  Orders Placed This Encounter  Procedures  . DG Chest 2 View  . Comprehensive metabolic panel  . CBC with Differential  . Urinalysis,  Routine w reflex microscopic  . Sodium, urine, random  . Osmolality, urine  . Patient may eat/drink  . Consult to hospitalist  . Place in observation (patient's expected length of stay will be less than 2 midnights)    Following Medications were ordered in ER: Medications  sodium chloride 0.9 % bolus 1,000 mL (0 mLs Intravenous Stopped 03/23/18 0549)    Significant initial  Findings: Abnormal Labs Reviewed  COMPREHENSIVE METABOLIC PANEL - Abnormal; Notable for the following components:      Result Value   Sodium 121 (*)    Chloride 87 (*)    Glucose, Bld 105 (*)    Alkaline Phosphatase 198 (*)    All other components within normal limits  CBC WITH DIFFERENTIAL/PLATELET - Abnormal; Notable for the following components:   HCT 34.2 (*)    Neutro Abs 8.0 (*)    All other components within normal limits  URINALYSIS, ROUTINE W REFLEX MICROSCOPIC - Abnormal; Notable for the following components:   Color, Urine STRAW (*)    Specific Gravity, Urine 1.002 (*)    Leukocytes, UA LARGE (*)    Bacteria, UA RARE (*)    All other components within normal limits     Na 121 K 3.6  Cr    stable,   Lab Results  Component Value Date   CREATININE 0.61 03/23/2018  CREATININE 0.71 03/22/2018   CREATININE 1.00 04/06/2017      WBC  9.9  HG/HCT   Stable     Component Value Date/Time   HGB 12.3 03/23/2018 0411   HCT 34.2 (L) 03/23/2018 0411        UA  no evidence of UTI  Diluted     CXR - COPD NON acute     ECG:  Not obtained    ED Triage Vitals  Enc Vitals Group     BP 03/23/18 0417 (!) 141/83     Pulse Rate 03/23/18 0417 75     Resp 03/23/18 0417 14     Temp 03/23/18 0417 97.7 F (36.5 C)     Temp Source 03/23/18 0417 Oral     SpO2 03/23/18 0357 94 %     Weight 03/23/18 0403 148 lb (67.1 kg)     Height 03/23/18 0403 5\' 5"  (1.651 m)     Head Circumference --      Peak Flow --      Pain Score 03/23/18 0402 8     Pain Loc --      Pain Edu? --      Excl. in [place]?  --   TMAX(24)@       Latest  Blood pressure (!) 141/83, pulse 75, temperature 97.7 F (36.5 C), temperature source Oral, resp. rate 14, height 5\' 5"  (1.651 m), weight 67.1 kg, SpO2 99 %.    Hospitalist was called for admission for hyponatremia   Review of Systems:    Pertinent positives include: fatigue, back pain, thirsty, increased frequency  Constitutional:  No weight loss, night sweats, Fevers, chills, , weight loss  HEENT:  No headaches, Difficulty swallowing,Tooth/dental problems,Sore throat,  No sneezing, itching, ear ache, nasal congestion, post nasal drip,  Cardio-vascular:  No chest pain, Orthopnea, PND, anasarca, dizziness, palpitations.no Bilateral lower extremity swelling  GI:  No heartburn, indigestion, abdominal pain, nausea, vomiting, diarrhea, change in bowel habits, loss of appetite, melena, blood in stool, hematemesis Resp:  no shortness of breath at rest. No dyspnea on exertion, No excess mucus, no productive cough, No non-productive cough, No coughing up of blood.No change in color of mucus.No wheezing. Skin:  no rash or lesions. No jaundice GU:  no dysuria, change in color of urine, no urgency or . No straining to urinate.  No flank pain.  Musculoskeletal:  No joint pain or no joint swelling. No decreased range of motion.    Psych:  No change in mood or affect. No depression or anxiety. No memory loss.  Neuro: no localizing neurological complaints, no tingling, no weakness, no double vision, no gait abnormality, no slurred speech, no confusion  All systems reviewed and apart from [place] all are negative  Past Medical History:   Past Medical History:  Diagnosis Date  . Asymptomatic varicose veins   . Cataract    beginnings  . Cigarette smoker   . COPD (chronic obstructive pulmonary disease) ([place])   . Diverticulosis of colon (without mention of hemorrhage)   . Headache(784.0)   . Hearing loss    wears hearing aides  . Lumbar back pain   . Pure  hypercholesterolemia   . Stress disorder, acute   . Varicose veins       Past Surgical History:  Procedure Laterality Date  . COLONOSCOPY  04-22-2004  . LUMBAR SPINE SURGERY     [phone]  . varicose vein treatment     at [facility]  Social History:  Ambulatory   [name]    reports that she has been smoking cigarettes. She has a 20.00 pack-year smoking history. She has never used smokeless tobacco. She reports that she does not drink alcohol or use drugs.     Family History:   Family History  Problem Relation Age of Onset  . Diabetes Father   . Heart attack Mother 36       nonsmoker  . Colon cancer Neg Hx   . Rectal cancer Neg Hx   . Stomach cancer Neg Hx     Allergies: Allergies  Allergen Reactions  . Atorvastatin     Intolerant to all statins   . Cyclobenzaprine Other (See Comments)    Bradycardia, hypotension  . Ezetimibe      INTOL to Zetia  . Rosuvastatin     intolerant to all statins  . Gadolinium Derivatives Nausea Only    Pt became very nauseous after gadolinium administration//lh  . Nicoderm [Nicotine] Rash    Unable to use the patches---caused severe rash to area placed     Prior to Admission medications   Medication Sig Start Date End Date Taking? Authorizing Provider  acetaminophen (TYLENOL) 500 MG tablet Take 500 mg by mouth daily.    [provider]  Cholecalciferol (VITAMIN D3 PO) Take 200 Units by mouth.    [provider]  levothyroxine (SYNTHROID, LEVOTHROID) 75 MCG tablet TAKE 1 TABLET(75 MCG) BY MOUTH DAILY 09/28/17   Marin Olp, MD  omeprazole (PRILOSEC) 20 MG capsule Take 1 capsule (20 mg total) by mouth daily. 01/28/18   Inda Coke, PA  ondansetron (ZOFRAN-ODT) 4 MG disintegrating tablet Take 1 tablet (4 mg total) by mouth every 8 (eight) hours as needed for nausea or vomiting. 03/04/18   Marin Olp, MD   Physical Exam: Blood pressure (!) 141/83, pulse 75, temperature 97.7 F (36.5 C),  temperature source Oral, resp. rate 14, height 5\' 5"  (1.651 m), weight 67.1 kg, SpO2 99 %. 1. General:  in No Acute distress  well  -appearing 2. Psychological: Alert and   Oriented 3. Head/ENT:    Dry Mucous Membranes                          Head Non traumatic, neck supple                            Poor Dentition 4. SKIN:    decreased Skin turgor,  Skin clean Dry and intact no rash 5. Heart: Regular rate and rhythm no  Murmur, no Rub or gallop 6. Lungs: distant  no wheezes or crackles   7. Abdomen: Soft, superpubic tenderness distended  bowel sounds present 8. Lower extremities: no clubbing, cyanosis, or edema 9. Neurologically Grossly intact, moving all 4 extremities equally  10. MSK: Normal range of motion   LABS:     Recent Labs  Lab 03/22/18 1521 03/23/18 0411  WBC 7.0 9.9  NEUTROABS  --  8.0*  HGB 12.1 12.3  HCT 34.0* 34.2*  MCV 81.1 81.6  PLT 398 979   Basic Metabolic Panel: Recent Labs  Lab 03/22/18 1521 03/23/18 0411  NA 118* 121*  K 4.2 3.6  CL 84* 87*  CO2 22 23  GLUCOSE 90 105*  BUN 12 12  CREATININE 0.71 0.61  CALCIUM 9.2 9.2      Recent Labs  Lab 03/22/18 1521 03/23/18 0411  AST 24 30  ALT 14 17  ALKPHOS  --  198*  BILITOT 0.5 0.3  PROT 6.3 6.8  ALBUMIN  --  3.7   No results for input(s): LIPASE, AMYLASE in the last 168 hours. No results for input(s): AMMONIA in the last 168 hours.    HbA1C: No results for input(s): HGBA1C in the last 72 hours. CBG: No results for input(s): GLUCAP in the last 168 hours.    Urine analysis:    Component Value Date/Time   COLORURINE STRAW (A) 03/23/2018 0526   APPEARANCEUR CLEAR 03/23/2018 0526   LABSPEC 1.002 (L) 03/23/2018 0526   PHURINE 7.0 03/23/2018 0526   GLUCOSEU NEGATIVE 03/23/2018 0526   GLUCOSEU NEGATIVE 01/28/2018 1055   HGBUR NEGATIVE 03/23/2018 0526   BILIRUBINUR NEGATIVE 03/23/2018 0526   BILIRUBINUR n 07/16/2015 0918   KETONESUR NEGATIVE 03/23/2018 0526   PROTEINUR NEGATIVE  03/23/2018 0526   UROBILINOGEN 0.2 01/28/2018 1055   NITRITE NEGATIVE 03/23/2018 0526   LEUKOCYTESUR LARGE (A) 03/23/2018 0526       Cultures: No results found for: SDES, SPECREQUEST, CULT, REPTSTATUS   Radiological Exams on Admission: Dg Chest 2 View  Result Date: 03/23/2018 CLINICAL DATA:  Hyponatremia. EXAM: CHEST - 2 VIEW COMPARISON:  Radiograph 04/06/2017 FINDINGS: The cardiomediastinal contours are normal. Chronic hyperinflation and bronchitic change pulmonary vasculature is normal. No consolidation, pleural effusion, or pneumothorax. No acute osseous abnormalities are seen. IMPRESSION: Chronic hyperinflation and bronchial thickening consistent with COPD. No superimposed acute abnormality. Electronically Signed   By: Keith Rake M.D.   On: 03/23/2018 04:40   Dg Abd 1 View  Result Date: 03/23/2018 CLINICAL DATA:  Pt stated that she had been constipated and 2 weeks ago her orthopedic surgeon had given her medications to clean her bowels out. Pt is complaining of feeling weak. Pt stated she has also had urinary frequency over the last 2 days. EXAM: ABDOMEN - 1 VIEW COMPARISON:  03/21/2018 FINDINGS: Stomach and small bowel decompressed. Normal distribution of gas and stool throughout the nondilated colon. No abnormal abdominal calcifications. Stable spondylitic changes in the lumbar spine. Advanced DJD in the right hip. IMPRESSION: Normal bowel gas pattern. Electronically Signed   By: Lucrezia Europe M.D.   On: 03/23/2018 09:02   Dg Abd 1 View  Result Date: 03/22/2018 CLINICAL DATA:  Constipation EXAM: ABDOMEN - 1 VIEW COMPARISON:  CT pelvis 02/27/2018.  KUB 04/06/2017 FINDINGS: Normal bowel gas pattern. Mild amount of stool in the colon. Negative for bowel obstruction or ileus. Negative for renal calculi Lumbar scoliosis and multilevel degenerative change. Advanced degenerative change in the right hip. Nondisplaced fractures in the right pubic rami are better seen on CT. No displaced  fracture on today's study. IMPRESSION: Normal bowel gas pattern with mild stool in the colon. Electronically Signed   By: Franchot Gallo M.D.   On: 03/22/2018 07:56    Chart has been reviewed    Assessment/Plan  77 y.o. female with medical history significant of COPD, tobacco abuse, hypothyroidism  Admitted for hyponatremia  Present on Admission: . Hyponatremia - Check urine electrolytes, patient appears to reduce large amount of clear urine has been drinking a large amount of water.  Will initiate fluid restriction obtain serial sodium levels and monitor, check TSH level . Debility -will need PT OT evaluation and possibly placement . COPD (chronic obstructive pulmonary disease) (HCC) -chronic currently stable . HYPERCHOLESTEROLEMIA - stable patient is intolerant of statins . Hypothyroidism- poor compliance has been taking her Synthroid in the  past.  Resume home dose of Synthroid for now check T4 T3 levels.  Patient should be encouraged to take her Synthroid, poorly treated hypothyroidism in this patient may contribute to hyponatremia  . Pubic ramus fracture (HCC) - PT/OT evaluation, and pain management . Fall - PT OT evaluation Obstipation  - will obtain KUB to determine stool burden status. Order bowel regimen. Suprapubic fullness - Obtain bladder scan to evaluate for incomplete emtying.   Tobacco abuse -  - Spoke about importance of quitting spent 5 minutes discussing options for treatment, prior attempts at quitting, and dangers of smoking    - states allergic to nicotine patch   - nursing tobacco cessation protocol  Urinary retention - foley in place, will need follow up with Urology  Other plan as per orders.  DVT prophylaxis:  SCD     Code Status:  FULL CODE as per patient   I had personally discussed CODE STATUS with patient   Family Communication:   Family not  at  Bedside    Disposition Plan:    likely will need placement for rehabilitation                                          Would benefit from PT/OT eval prior to DC  Ordered                                      Nutrition    consulted                                     Consults called: none  Admission status:   Obs    Level of care       medical floor         Toy Baker 03/23/2018, 8:57 AM    [facility]  Pager [phone]   after 2 AM please page floor coverage PA If 7AM-7PM, please contact the day team taking care of the patient  [email or web address]  Password [email or web address]

## 2018-03-23 NOTE — Telephone Encounter (Signed)
Dr. Lorelei Pont - thank you so much for being available to help patient during the night.

## 2018-03-23 NOTE — Evaluation (Addendum)
Physical Therapy Evaluation Patient Details Name: [name] [name] MRN: [id number] DOB: Jul 05, 1940 Today's Date: 03/23/2018   History of Present Illness  77 yo female with onset of urinary retention and difficulty walking was readmitted after pubic ramus fracture recently, now has constipation and pain with her back and then abdomen.  Last DC was 10/2.   PMHx:  pubic ramus fracture,  falls, diverticulosis, tobacco abuse, lumbar pain and surgery, varicose veins, hypothyroidism, COPD, UTI,   Clinical Impression  Pt was seen for evaluation of mobility and to instruct her on safety and body mechanics.  Pt is home with husband since her injury, and is not able to manage safely to walk or to get up and down from bed.  Pain and weakness are limitations, and has agreed to consider our SNF if she cannot safely walk alone.  Follow acutely for same goals to try and progress past SNF but anticipate this is the best option.    Follow Up Recommendations SNF    Equipment Recommendations  None recommended by PT    Recommendations for Other Services       Precautions / Restrictions Precautions Precautions: Fall Precaution Comments: pubic ramus fracture Restrictions Weight Bearing Restrictions: Yes Other Position/Activity Restrictions: WBAT      Mobility  Bed Mobility Overal bed mobility: Needs Assistance Bed Mobility: Supine to Sit;Sit to Supine     Supine to sit: Min assist Sit to supine: Min assist;Min guard   General bed mobility comments: minor help for LE's and to cue her body mechanics  Transfers Overall transfer level: Needs assistance Equipment used: Rolling walker (2 wheeled);1 person hand held assist Transfers: Sit to/from Stand Sit to Stand: Mod assist         General transfer comment: lists backward from standing even with bed to support her  Ambulation/Gait Ambulation/Gait assistance: Min assist Gait Distance (Feet): 7 Feet Assistive device: Rolling walker (2  wheeled);1 person hand held assist Gait Pattern/deviations: Step-to pattern;Decreased stride length;Wide base of support;Trunk flexed Gait velocity: reduced   General Gait Details: pt is about to sit down on the bed with each sidestep, very unsteady standing with intial efforts.  Stairs            Wheelchair Mobility    Modified Rankin (Stroke Patients Only)       Balance Overall balance assessment: Needs assistance Sitting-balance support: Feet supported;Bilateral upper extremity supported Sitting balance-Leahy Scale: Fair     Standing balance support: Bilateral upper extremity supported;During functional activity Standing balance-Leahy Scale: Poor                               Pertinent Vitals/Pain Pain Assessment: Faces Faces Pain Scale: Hurts even more Pain Location: pubic fracture Pain Descriptors / Indicators: Tender Pain Intervention(s): Limited activity within patient's tolerance;Monitored during session;Premedicated before session;Repositioned    Home Living Family/patient expects to be discharged to:: Private residence Living Arrangements: Spouse/significant other Available Help at Discharge: Family;Available 24 hours/day Type of Home: House Home Access: Stairs to enter   [employer] of Steps: 1 Home Layout: One level Home Equipment: Walker - 2 wheels;Cane - single point Additional Comments: pt has limited help with her husband due to his extended age    Prior Function Level of Independence: Independent               Hand Dominance        Extremity/Trunk Assessment   Upper Extremity Assessment  Upper Extremity Assessment: Overall WFL for tasks assessed    Lower Extremity Assessment Lower Extremity Assessment: Generalized weakness    Cervical / Trunk Assessment Cervical / Trunk Assessment: Kyphotic  Communication   Communication: No difficulties  Cognition Arousal/Alertness: Awake/alert Behavior During  Therapy: WFL for tasks assessed/performed Overall Cognitive Status: No family/caregiver present to determine baseline cognitive functioning                                 General Comments: pt has some difficulty controlling her balance and is partly attention      General Comments General comments (skin integrity, edema, etc.): pt reports at home her husband assists her with RW, but cannot walk far    Exercises     Assessment/Plan    PT Assessment Patient needs continued PT services  PT Problem List Decreased strength;Decreased range of motion;Decreased activity tolerance;Decreased balance;Decreased mobility;Decreased coordination;Decreased cognition;Decreased knowledge of use of DME;Decreased safety awareness;Decreased knowledge of precautions;Cardiopulmonary status limiting activity;Decreased skin integrity;Pain       PT Treatment Interventions DME instruction;Gait training;Stair training;Functional mobility training;Therapeutic activities;Therapeutic exercise;Balance training;Neuromuscular re-education;Patient/family education    PT Goals (Current goals can be found in the Care Plan section)  Acute Rehab PT Goals Patient Stated Goal: to walk and feel stronger PT Goal Formulation: With patient Time For Goal Achievement: 04/06/18 Potential to Achieve Goals: Good    Frequency Min 2X/week   Barriers to discharge Inaccessible home environment;Decreased caregiver support home with husband who is not going to be able to assist her low strength    Co-evaluation               AM-PAC PT "6 Clicks" Daily Activity  Outcome Measure Difficulty turning over in bed (including adjusting bedclothes, sheets and blankets)?: Unable Difficulty moving from lying on back to sitting on the side of the bed? : Unable Difficulty sitting down on and standing up from a chair with arms (e.g., wheelchair, bedside commode, etc,.)?: Unable Help needed moving to and from a bed to chair  (including a wheelchair)?: A Lot Help needed walking in hospital room?: A Lot Help needed climbing 3-5 steps with a railing? : A Lot 6 Click Score: 9    End of Session Equipment Utilized During Treatment: Gait belt Activity Tolerance: Patient limited by fatigue;Patient limited by pain Patient left: in bed;with call bell/phone within reach;with nursing/sitter in room Nurse Communication: Mobility status PT Visit Diagnosis: Other abnormalities of gait and mobility (R26.89);Muscle weakness (generalized) (M62.81);Difficulty in walking, not elsewhere classified (R26.2);Adult, failure to thrive (R62.7);Pain Pain - Right/Left: (bilateral) Pain - part of body: Hip    Time: 1638-4665 PT Time Calculation (min) (ACUTE ONLY): 24 min   Charges:   PT Evaluation $PT Eval Moderate Complexity: 1 Mod PT Treatments $Therapeutic Activity: 8-22 mins       Ramond Dial 03/23/2018, 2:14 PM   Mee Hives, PT MS Acute Rehab Dept. Number: [place] and [place]

## 2018-03-23 NOTE — ED Provider Notes (Signed)
[facility] DEPT Provider Note   CSN: [id number] Arrival date & time: 03/23/18  0354     History   Chief Complaint Chief Complaint  Patient presents with  . Abnormal Labs    HPI [name] [name] is a 77 y.o. female.  The history is provided by the patient.  She has history of hyperlipidemia, COPD and was told to come into the ED because of hyponatremia.  He had been in her physician's office 2 days ago when routine labs showed low sodium.  Patient states that she is not currently taking any medications.  She denies nausea, vomiting, diarrhea.  She does state that she had been constipated, and 2 weeks ago her orthopedic surgeon had given her medications to clean her bowels out.  She is complaining of feeling weak.  She has also had urinary frequency over the last 2 days.  She denies fever chills or cough.  Past Medical History:  Diagnosis Date  . Asymptomatic varicose veins   . Cataract    beginnings  . Cigarette smoker   . COPD (chronic obstructive pulmonary disease) ([place])   . Diverticulosis of colon (without mention of hemorrhage)   . Headache(784.0)   . Hearing loss    wears hearing aides  . Lumbar back pain   . Pure hypercholesterolemia   . Stress disorder, acute   . Varicose veins     Patient Active Problem List   Diagnosis Date Noted  . Primary osteoarthritis of right hip 02/07/2018  . Belching 02/01/2018  . Varicose veins 12/02/2013  . OA (osteoarthritis) 08/15/2012  . Hearing loss 08/16/2009  . Hypothyroidism 10/12/2007  . CIGARETTE SMOKER 10/09/2007  . HYPERCHOLESTEROLEMIA 10/08/2007  . COPD (chronic obstructive pulmonary disease) ([place]) 10/08/2007  . BACK PAIN, LUMBAR 10/08/2007    Past Surgical History:  Procedure Laterality Date  . COLONOSCOPY  04-22-2004  . LUMBAR SPINE SURGERY     [phone]  . varicose vein treatment     at [facility]     OB History   None      Home Medications    Prior to  Admission medications   Medication Sig Start Date End Date Taking? Authorizing Provider  acetaminophen (TYLENOL) 500 MG tablet Take 500 mg by mouth daily.    [provider]  Cholecalciferol (VITAMIN D3 PO) Take 200 Units by mouth.    [provider]  levothyroxine (SYNTHROID, LEVOTHROID) 75 MCG tablet TAKE 1 TABLET(75 MCG) BY MOUTH DAILY 09/28/17   Marin Olp, MD  omeprazole (PRILOSEC) 20 MG capsule Take 1 capsule (20 mg total) by mouth daily. 01/28/18   Inda Coke, PA  ondansetron (ZOFRAN-ODT) 4 MG disintegrating tablet Take 1 tablet (4 mg total) by mouth every 8 (eight) hours as needed for nausea or vomiting. 03/04/18   Marin Olp, MD    Family History Family History  Problem Relation Age of Onset  . Diabetes Father   . Heart attack Mother 46       nonsmoker  . Colon cancer Neg Hx   . Rectal cancer Neg Hx   . Stomach cancer Neg Hx     Social History Social History   Tobacco Use  . Smoking status: Current Every Day Smoker    Packs/day: 0.50    Years: 40.00    Pack years: 20.00    Types: Cigarettes  . Smokeless tobacco: Never Used  . Tobacco comment: 1/2-3/4 of a pk a day   Substance Use  Topics  . Alcohol use: No    Alcohol/week: 0.0 standard drinks  . Drug use: No     Allergies   Atorvastatin; Cyclobenzaprine; Ezetimibe; Rosuvastatin; Gadolinium derivatives; and Nicoderm [nicotine]   Review of Systems Review of Systems  All other systems reviewed and are negative.    Physical Exam Updated Vital Signs BP (!) 141/83 (BP Location: Left Arm)   Pulse 75   Temp 97.7 F (36.5 C) (Oral)   Resp 14   Ht 5\' 5"  (1.651 m)   Wt 67.1 kg   SpO2 99%   BMI 24.63 kg/m   Physical Exam  Nursing note and vitals reviewed.  77 year old female, resting comfortably and in no acute distress. Vital signs are normal. Oxygen saturation is 99%, which is normal. Head is normocephalic and atraumatic. PERRLA, EOMI. Oropharynx is clear. Neck is  nontender and supple without adenopathy or JVD. Back is nontender and there is no CVA tenderness. Lungs are clear without rales, wheezes, or rhonchi. Chest is nontender. Heart has regular rate and rhythm without murmur. Abdomen is soft, flat, nontender without masses or hepatosplenomegaly and peristalsis is normoactive. Extremities have no cyanosis or edema, full range of motion is present. Skin is warm and dry without rash. Neurologic: Mental status is normal, cranial nerves are intact, there are no motor or sensory deficits.  ED Treatments / Results  Labs (all labs ordered are listed, but only abnormal results are displayed) Labs Reviewed  COMPREHENSIVE METABOLIC PANEL - Abnormal; Notable for the following components:      Result Value   Sodium 121 (*)    Chloride 87 (*)    Glucose, Bld 105 (*)    Alkaline Phosphatase 198 (*)    All other components within normal limits  CBC WITH DIFFERENTIAL/PLATELET - Abnormal; Notable for the following components:   HCT 34.2 (*)    Neutro Abs 8.0 (*)    All other components within normal limits  URINALYSIS, ROUTINE W REFLEX MICROSCOPIC - Abnormal; Notable for the following components:   Color, Urine STRAW (*)    Specific Gravity, Urine 1.002 (*)    Leukocytes, UA LARGE (*)    Bacteria, UA RARE (*)    All other components within normal limits  SODIUM, URINE, RANDOM  OSMOLALITY, URINE    Radiology Dg Chest 2 View  Result Date: 03/23/2018 CLINICAL DATA:  Hyponatremia. EXAM: CHEST - 2 VIEW COMPARISON:  Radiograph 04/06/2017 FINDINGS: The cardiomediastinal contours are normal. Chronic hyperinflation and bronchitic change pulmonary vasculature is normal. No consolidation, pleural effusion, or pneumothorax. No acute osseous abnormalities are seen. IMPRESSION: Chronic hyperinflation and bronchial thickening consistent with COPD. No superimposed acute abnormality. Electronically Signed   By: Keith Rake M.D.   On: 03/23/2018 04:40   Dg Abd 1  View  Result Date: 03/22/2018 CLINICAL DATA:  Constipation EXAM: ABDOMEN - 1 VIEW COMPARISON:  CT pelvis 02/27/2018.  KUB 04/06/2017 FINDINGS: Normal bowel gas pattern. Mild amount of stool in the colon. Negative for bowel obstruction or ileus. Negative for renal calculi Lumbar scoliosis and multilevel degenerative change. Advanced degenerative change in the right hip. Nondisplaced fractures in the right pubic rami are better seen on CT. No displaced fracture on today's study. IMPRESSION: Normal bowel gas pattern with mild stool in the colon. Electronically Signed   By: Franchot Gallo M.D.   On: 03/22/2018 07:56    Procedures Procedures  CRITICAL CARE Performed by: Delora Fuel Total critical care time: 35 minutes Critical care time was exclusive  of separately billable procedures and treating other patients. Critical care was necessary to treat or prevent imminent or life-threatening deterioration. Critical care was time spent personally by me on the following activities: development of treatment plan with patient and/or surrogate as well as nursing, discussions with consultants, evaluation of patient's response to treatment, examination of patient, obtaining history from patient or surrogate, ordering and performing treatments and interventions, ordering and review of laboratory studies, ordering and review of radiographic studies, pulse oximetry and re-evaluation of patient's condition.  Medications Ordered in ED Medications  sodium chloride 0.9 % bolus 1,000 mL (has no administration in time range)     Initial Impression / Assessment and Plan / ED Course  I have reviewed the triage vital signs and the nursing notes.  Pertinent labs & imaging results that were available during my care of the patient were reviewed by me and considered in my medical decision making (see chart for details).  Hyponatremia.  Old records are reviewed confirming labs drawn yesterday afternoon with sodium of 118.   Last labs prior to that were 1 year ago at which time sodium was 133.  She is on not on any medications which should cause hyponatremia.  She does not appear dehydrated.  This could be lab error, so we will recheck electrolytes if true, likely represents SIADH.  Will check chest x-ray to make sure no lung cancer present that might be a cause of SIADH.  With urinary frequency, will check urinalysis.  Sodium has come back 121.  Chest x-ray shows no evidence of malignancy.  Cause for hyponatremia is not clear.  Incidental finding on metabolic panel is elevated alkaline phosphatase.  Case is discussed with Dr. Hal Hope of [facility], who agrees to admit the patient.  Final Clinical Impressions(s) / ED Diagnoses   Final diagnoses:  Hyponatremia  Elevated serum alkaline phosphatase level    ED Discharge Orders    None       Delora Fuel, MD 31/43/88 [phone]

## 2018-03-23 NOTE — ED Notes (Signed)
Report called to [facility] around [phone], afterwards provider ordered bladder scan and KUB due to abdominal distension. Bladder scanned revealed >941ml, indwelling catheter placed. Updated floor on patient status

## 2018-03-24 DIAGNOSIS — J449 Chronic obstructive pulmonary disease, unspecified: Secondary | ICD-10-CM | POA: Diagnosis not present

## 2018-03-24 DIAGNOSIS — E039 Hypothyroidism, unspecified: Secondary | ICD-10-CM | POA: Diagnosis not present

## 2018-03-24 DIAGNOSIS — R5381 Other malaise: Secondary | ICD-10-CM | POA: Diagnosis not present

## 2018-03-24 DIAGNOSIS — E871 Hypo-osmolality and hyponatremia: Secondary | ICD-10-CM | POA: Diagnosis not present

## 2018-03-24 LAB — BASIC METABOLIC PANEL
Anion gap: 8 (ref 5–15)
Anion gap: 10 (ref 5–15)
BUN: 11 mg/dL (ref 8–23)
BUN: 15 mg/dL (ref 8–23)
CHLORIDE: 97 mmol/L — AB (ref 98–111)
CO2: 26 mmol/L (ref 22–32)
CO2: 23 mmol/L (ref 22–32)
CREATININE: 0.8 mg/dL (ref 0.44–1.00)
Calcium: 9.6 mg/dL (ref 8.9–10.3)
Calcium: 9.4 mg/dL (ref 8.9–10.3)
Chloride: 96 mmol/L — ABNORMAL LOW (ref 98–111)
Creatinine, Ser: 0.75 mg/dL (ref 0.44–1.00)
GFR calc Af Amer: >60 mL/min (ref >60)
GFR calc Af Amer: >60 mL/min (ref >60)
GFR calc non Af Amer: >60 mL/min (ref >60)
GFR calc non Af Amer: >60 mL/min (ref >60)
GLUCOSE: 89 mg/dL (ref 70–99)
GLUCOSE: 95 mg/dL (ref 70–99)
POTASSIUM: 3.7 mmol/L (ref 3.5–5.1)
Potassium: 3.8 mmol/L (ref 3.5–5.1)
SODIUM: 130 mmol/L — AB (ref 135–145)
Sodium: 130 mmol/L — ABNORMAL LOW (ref 135–145)

## 2018-03-24 LAB — COMPREHENSIVE METABOLIC PANEL
ALT: 17 U/L (ref 0–44)
ANION GAP: 9 (ref 5–15)
AST: 27 U/L (ref 15–41)
Albumin: 3.4 g/dL — ABNORMAL LOW (ref 3.5–5.0)
Alkaline Phosphatase: 178 U/L — ABNORMAL HIGH (ref 38–126)
BUN: 13 mg/dL (ref 8–23)
CO2: 26 mmol/L (ref 22–32)
Calcium: 9.2 mg/dL (ref 8.9–10.3)
Chloride: 91 mmol/L — ABNORMAL LOW (ref 98–111)
Creatinine, Ser: 0.8 mg/dL (ref 0.44–1.00)
Glucose, Bld: 102 mg/dL — ABNORMAL HIGH (ref 70–99)
POTASSIUM: 3.5 mmol/L (ref 3.5–5.1)
Sodium: 126 mmol/L — ABNORMAL LOW (ref 135–145)
Total Bilirubin: 0.7 mg/dL (ref 0.3–1.2)
Total Protein: 6.3 g/dL — ABNORMAL LOW (ref 6.5–8.1)

## 2018-03-24 LAB — T3, FREE: T3, Free: 2.4 pg/mL (ref 2.0–4.4)

## 2018-03-24 LAB — MAGNESIUM: MAGNESIUM: 2.3 mg/dL (ref 1.7–2.4)

## 2018-03-24 LAB — PHOSPHORUS: Phosphorus: 2.1 mg/dL — ABNORMAL LOW (ref 2.5–4.6)

## 2018-03-24 LAB — URINE CULTURE

## 2018-03-24 LAB — CBC
HEMATOCRIT: 35.4 % — AB (ref 36.0–46.0)
HEMOGLOBIN: 12.1 g/dL (ref 12.0–15.0)
MCH: 28.7 pg (ref 26.0–34.0)
MCHC: 34.2 g/dL (ref 30.0–36.0)
MCV: 83.9 fL (ref 78.0–100.0)
Platelets: 405 10*3/uL — ABNORMAL HIGH (ref 150–400)
RBC: 4.22 MIL/uL (ref 3.87–5.11)
RDW: 13.2 % (ref 11.5–15.5)
WBC: 6.7 10*3/uL (ref 4.0–10.5)

## 2018-03-24 LAB — T3: T3, Total: 100 ng/dL (ref 71–180)

## 2018-03-24 MED ORDER — LIP MEDEX EX OINT
TOPICAL_OINTMENT | CUTANEOUS | Status: AC
  Administered 2018-03-24: 16:00:00
  Filled 2018-03-24: qty 7

## 2018-03-24 MED ORDER — ORAL CARE MOUTH RINSE
15.0000 mL | Freq: Two times a day (BID) | OROMUCOSAL | Status: DC
  Administered 2018-03-24 – 2018-03-27 (×7): 15 mL via OROMUCOSAL

## 2018-03-24 MED ORDER — TRAZODONE HCL 50 MG PO TABS
50.0000 mg | ORAL_TABLET | Freq: Every evening | ORAL | Status: DC | PRN
  Administered 2018-03-24: 50 mg via ORAL
  Filled 2018-03-24: qty 1

## 2018-03-24 MED ORDER — NICOTINE POLACRILEX 2 MG MT GUM
2.0000 mg | CHEWING_GUM | OROMUCOSAL | Status: DC | PRN
  Administered 2018-03-24: 2 mg via ORAL
  Filled 2018-03-24 (×3): qty 1

## 2018-03-24 MED ORDER — SODIUM CHLORIDE 0.9 % IV SOLN
INTRAVENOUS | Status: DC
  Administered 2018-03-24 – 2018-03-25 (×4): via INTRAVENOUS

## 2018-03-24 MED ORDER — HYDROCODONE-ACETAMINOPHEN 5-325 MG PO TABS
1.0000 | ORAL_TABLET | ORAL | Status: DC | PRN
  Administered 2018-03-25 (×2): 2 via ORAL
  Administered 2018-03-26 (×3): 1 via ORAL
  Administered 2018-03-26: 2 via ORAL
  Filled 2018-03-24: qty 2
  Filled 2018-03-24 (×2): qty 1
  Filled 2018-03-24: qty 2
  Filled 2018-03-24: qty 1
  Filled 2018-03-24: qty 2

## 2018-03-24 MED ORDER — POLYETHYLENE GLYCOL 3350 17 G PO PACK
17.0000 g | PACK | Freq: Every day | ORAL | Status: DC
  Administered 2018-03-25: 17 g via ORAL
  Filled 2018-03-24 (×2): qty 1

## 2018-03-24 NOTE — Progress Notes (Addendum)
PROGRESS NOTE  [name] [name]  VQQ:[id number] DOB: 05/11/1941 DOA: 03/23/2018 PCP: Marin Olp, MD  Outpatient Specialists: Drs. Paulla Fore and Lorin Mercy Brief Narrative: [name] [name] is a 77 y.o. female with a history of COPD, ongoing tobacco use, hypothyroidism with incomplete adherence to synthroid, and recent pubic ramus fracture. She had been taking narcotic pain medications for the fracture but still had significant pain and additionally become severely constipated from the opioids though this has resolved with discontinuation of opioids and bowel regimen per PCP. Unfortunately she's had severe low back pain and ambulating less despite advil and tylenol at home and injection by sports medicine. She's been getting around with increasing difficulty, initially using a walker, now often uses a wheelchair and has needed EMS to come to the home to get her up as her 20 year old husband was unable to. On evaluation with her PCP she endorsed decreased per oral intake for which labs were checked. When Na came back at 118 she was contacted and referred to the ED where Na confirmed to be low at 121 and patient admitted.  Assessment & Plan: Active Problems:   Hypothyroidism   HYPERCHOLESTEROLEMIA   COPD (chronic obstructive pulmonary disease) (HCC)   Hyponatremia   Debility   Pubic ramus fracture (HCC)   Fall   Urinary retention  Hyponatremia: Multifactorial. With euvolemia on exam, highest suspicion is for polydipsia due to dry mouth and insufficient solute intake due to debility, undertreated hypothyroidism. Also consider SIADH, less likely due to improvement with isotonic fluids. Could consider opioid effect, less likely with no recent administration. No diuretics, ACE, or recent steroid use. - Fluid restriction - Restarted isotonic IVF this AM after correction of 76mEq over 36 hours, well within upper limit of goal of 1mEq in 24 hours.  - Restart synthroid as below  Hypothyroidism: TSH  modestly elevated in the setting of incomplete adherence. The patient does not like the tablet she gets from her new pharmacy, but agrees to take it.  - Synthroid restarted at stable dose, as TSH is improving from prior value and free values of T3, T4 are wnl.   Constipation: Improved with holding opioids. - Senna, miralax. Can give dulcolax pr prn  Bacteriuria: With +dipstick but no WBC or RBC on micro. Urine culture nonclonal.  - No further treatment indicated.  Deconditioning: Due to decreased exercise/mobility due to pain from pubic ramus fracture initially.  - PT/OT. Anticipate SNF would be appropriate venue due to her elderly husband's limited ability to assist her at home. - Will attempt to control pain a little better to facilitate physical therapy. Bowel regimen as above.  Dry eyes, dry mouth, constipation: Per report these are acute on longstanding problems.  - No h/o systemic rheumatic disease, though serology could be considered (e.g. anti-Ro/SSA, anti-La/SSB) for further evaluation of Sjogren's. These would not be directly germane to the reason for admission, so are deferred to PCP's discretion. - Continue eye drops, can use sponge for dry mouth, but reviewed fluid restriction with patient today.  - Bowel regimen as above.  Tobacco use:  - Cessation counseling provided at admission and this morning.  - Trial nicorette gum. Pt had cutaneous-only reaction to patch and was given allergy designation to nicotine. This is clearly not the case as she continues to smoke, but instead a contact dermatitis (irritant vs. allergy) of patch adhesive, so gum is felt to have low risk of reaction.   COPD: No exacerbation.  - Prn's  Insomnia:  -  Trial trazodone  GERD:  - PPI  DVT prophylaxis: SCDs Code Status: Full Family Communication: Husband at bedside Disposition Plan: SNF in next 1-2 days.  Consultants:   PT  Procedures:   None  Antimicrobials:  None    Subjective: Pain in lower back remains, no tremors, nausea, vomiting. Continues to have dry mouth. No BM recently.  Objective: Vitals:   03/23/18 1338 03/23/18 2012 03/24/18 0422 03/24/18 1331  BP: 113/60 130/63 128/60 124/69  Pulse: 70 65 70 71  Resp: 15 14 16 16   Temp: 97.9 F (36.6 C) 97.9 F (36.6 C) 98 F (36.7 C) 98.4 F (36.9 C)  TempSrc: Oral Oral Oral Oral  SpO2: 98% 99% 98% 97%  Weight:      Height:        Intake/Output Summary (Last 24 hours) at 03/24/2018 1749 Last data filed at 03/24/2018 1600 Gross per 24 hour  Intake 1020 ml  Output 1350 ml  Net -330 ml   Filed Weights   03/23/18 0403 03/23/18 1029  Weight: 67.1 kg 66.5 kg    Gen: 77 y.o. female in no distress  Pulm: Non-labored breathing room air. Clear to auscultation bilaterally.  CV: Regular rate and rhythm. No murmur, rub, or gallop. No JVD, no pedal edema. GI: Abdomen soft, non-tender, non-distended, with normoactive bowel sounds. No organomegaly or masses felt. MSK: Warm, no deformities. Paraspinal tenderness and spasm in lumbar region. No stepoffs or midline tenderness. Skin: No rashes, lesions or ulcers Neuro: Alert and oriented. HOH. No focal neurological deficits. Psych: Judgement and insight appear normal. Mood & affect appropriate.   Data Reviewed: I have personally reviewed following labs and imaging studies  CBC: Recent Labs  Lab 03/22/18 1521 03/23/18 0411 03/24/18 0154  WBC 7.0 9.9 6.7  NEUTROABS  --  8.0*  --   HGB 12.1 12.3 12.1  HCT 34.0* 34.2* 35.4*  MCV 81.1 81.6 83.9  PLT 398 383 616*   Basic Metabolic Panel: Recent Labs  Lab 03/23/18 1055 03/23/18 1827 03/24/18 0154 03/24/18 0958 03/24/18 1642  NA 128* 127* 126* 130* 130*  K 3.5 3.4* 3.5 3.8 3.7  CL 93* 94* 91* 96* 97*  CO2 24 23 26 26 23   GLUCOSE 99 130* 102* 89 95  BUN 8 15 13 11 15   CREATININE 0.70 0.76 0.80 0.75 0.80  CALCIUM 9.8 9.3 9.2 9.6 9.4  MG  --   --  2.3  --   --   PHOS  --   --  2.1*  --    --    GFR: Estimated Creatinine Clearance: 55.5 mL/min (by C-G formula based on SCr of 0.8 mg/dL). Liver Function Tests: Recent Labs  Lab 03/22/18 1521 03/23/18 0411 03/24/18 0154  AST 24 30 27   ALT 14 17 17   ALKPHOS  --  198* 178*  BILITOT 0.5 0.3 0.7  PROT 6.3 6.8 6.3*  ALBUMIN  --  3.7 3.4*   No results for input(s): LIPASE, AMYLASE in the last 168 hours. No results for input(s): AMMONIA in the last 168 hours. Coagulation Profile: No results for input(s): INR, PROTIME in the last 168 hours. Cardiac Enzymes: No results for input(s): CKTOTAL, CKMB, CKMBINDEX, TROPONINI in the last 168 hours. BNP (last 3 results) No results for input(s): PROBNP in the last 8760 hours. HbA1C: No results for input(s): HGBA1C in the last 72 hours. CBG: No results for input(s): GLUCAP in the last 168 hours. Lipid Profile: No results for input(s): CHOL, HDL, LDLCALC, TRIG,  CHOLHDL, LDLDIRECT in the last 72 hours. Thyroid Function Tests: Recent Labs    03/23/18 0411 03/23/18 1055  TSH 6.213*  --   FREET4  --  1.16  T3FREE  --  2.4   Anemia Panel: No results for input(s): VITAMINB12, FOLATE, FERRITIN, TIBC, IRON, RETICCTPCT in the last 72 hours. Urine analysis:    Component Value Date/Time   COLORURINE STRAW (A) 03/23/2018 0526   APPEARANCEUR CLEAR 03/23/2018 0526   LABSPEC 1.002 (L) 03/23/2018 0526   PHURINE 7.0 03/23/2018 0526   GLUCOSEU NEGATIVE 03/23/2018 0526   GLUCOSEU NEGATIVE 01/28/2018 1055   HGBUR NEGATIVE 03/23/2018 0526   BILIRUBINUR NEGATIVE 03/23/2018 0526   BILIRUBINUR n 07/16/2015 0918   KETONESUR NEGATIVE 03/23/2018 0526   PROTEINUR NEGATIVE 03/23/2018 0526   UROBILINOGEN 0.2 01/28/2018 1055   NITRITE NEGATIVE 03/23/2018 0526   LEUKOCYTESUR LARGE (A) 03/23/2018 0526   Recent Results (from the past 240 hour(s))  Urine Culture     Status: Abnormal   Collection Time: 03/23/18  5:26 AM  Result Value Ref Range Status   Specimen Description   Final    URINE, CLEAN  CATCH Performed at [facility], [place] [address], [place], [place] [address]    Special Requests   Final    NONE Performed at [facility], [place] [address] [place],  [address]    Culture MULTIPLE SPECIES PRESENT, SUGGEST RECOLLECTION (A)  Final   Report Status 03/24/2018 FINAL  Final      Radiology Studies: Dg Chest 2 View  Result Date: 03/23/2018 CLINICAL DATA:  Hyponatremia. EXAM: CHEST - 2 VIEW COMPARISON:  Radiograph 04/06/2017 FINDINGS: The cardiomediastinal contours are normal. Chronic hyperinflation and bronchitic change pulmonary vasculature is normal. No consolidation, pleural effusion, or pneumothorax. No acute osseous abnormalities are seen. IMPRESSION: Chronic hyperinflation and bronchial thickening consistent with COPD. No superimposed acute abnormality. Electronically Signed   By: Keith Rake M.D.   On: 03/23/2018 04:40   Dg Abd 1 View  Result Date: 03/23/2018 CLINICAL DATA:  Pt stated that she had been constipated and 2 weeks ago her orthopedic surgeon had given her medications to clean her bowels out. Pt is complaining of feeling weak. Pt stated she has also had urinary frequency over the last 2 days. EXAM: ABDOMEN - 1 VIEW COMPARISON:  03/21/2018 FINDINGS: Stomach and small bowel decompressed. Normal distribution of gas and stool throughout the nondilated colon. No abnormal abdominal calcifications. Stable spondylitic changes in the lumbar spine. Advanced DJD in the right hip. IMPRESSION: Normal bowel gas pattern. Electronically Signed   By: Lucrezia Europe M.D.   On: 03/23/2018 09:02    Scheduled Meds: . levothyroxine  75 mcg Oral QAC breakfast  . mouth rinse  15 mL Mouth Rinse BID  . pantoprazole  40 mg Oral Daily  . senna  1 tablet Oral BID   Continuous Infusions: . sodium chloride 75 mL/hr at 03/24/18 0746     LOS: 0 days   Time spent: 25 minutes.  Patrecia Pour, MD [facility] [email or web address] Password  [email or web address] 03/24/2018, 5:49 PM

## 2018-03-25 DIAGNOSIS — R5381 Other malaise: Secondary | ICD-10-CM | POA: Diagnosis not present

## 2018-03-25 DIAGNOSIS — Z79899 Other long term (current) drug therapy: Secondary | ICD-10-CM | POA: Diagnosis not present

## 2018-03-25 DIAGNOSIS — J449 Chronic obstructive pulmonary disease, unspecified: Secondary | ICD-10-CM | POA: Diagnosis not present

## 2018-03-25 DIAGNOSIS — F17210 Nicotine dependence, cigarettes, uncomplicated: Secondary | ICD-10-CM | POA: Diagnosis present

## 2018-03-25 DIAGNOSIS — K219 Gastro-esophageal reflux disease without esophagitis: Secondary | ICD-10-CM | POA: Diagnosis present

## 2018-03-25 DIAGNOSIS — K5909 Other constipation: Secondary | ICD-10-CM | POA: Diagnosis present

## 2018-03-25 DIAGNOSIS — Z7401 Bed confinement status: Secondary | ICD-10-CM | POA: Diagnosis not present

## 2018-03-25 DIAGNOSIS — M2550 Pain in unspecified joint: Secondary | ICD-10-CM | POA: Diagnosis not present

## 2018-03-25 DIAGNOSIS — R8271 Bacteriuria: Secondary | ICD-10-CM | POA: Diagnosis present

## 2018-03-25 DIAGNOSIS — Z9114 Patient's other noncompliance with medication regimen: Secondary | ICD-10-CM | POA: Diagnosis not present

## 2018-03-25 DIAGNOSIS — R339 Retention of urine, unspecified: Secondary | ICD-10-CM

## 2018-03-25 DIAGNOSIS — W19XXXS Unspecified fall, sequela: Secondary | ICD-10-CM | POA: Diagnosis not present

## 2018-03-25 DIAGNOSIS — Z888 Allergy status to other drugs, medicaments and biological substances status: Secondary | ICD-10-CM | POA: Diagnosis not present

## 2018-03-25 DIAGNOSIS — E7800 Pure hypercholesterolemia, unspecified: Secondary | ICD-10-CM | POA: Diagnosis present

## 2018-03-25 DIAGNOSIS — G4700 Insomnia, unspecified: Secondary | ICD-10-CM | POA: Diagnosis present

## 2018-03-25 DIAGNOSIS — I10 Essential (primary) hypertension: Secondary | ICD-10-CM | POA: Diagnosis not present

## 2018-03-25 DIAGNOSIS — E871 Hypo-osmolality and hyponatremia: Secondary | ICD-10-CM | POA: Diagnosis not present

## 2018-03-25 DIAGNOSIS — W19XXXA Unspecified fall, initial encounter: Secondary | ICD-10-CM | POA: Diagnosis not present

## 2018-03-25 DIAGNOSIS — H9190 Unspecified hearing loss, unspecified ear: Secondary | ICD-10-CM | POA: Diagnosis present

## 2018-03-25 DIAGNOSIS — E039 Hypothyroidism, unspecified: Secondary | ICD-10-CM | POA: Diagnosis not present

## 2018-03-25 DIAGNOSIS — R682 Dry mouth, unspecified: Secondary | ICD-10-CM | POA: Diagnosis present

## 2018-03-25 DIAGNOSIS — Z79891 Long term (current) use of opiate analgesic: Secondary | ICD-10-CM | POA: Diagnosis not present

## 2018-03-25 LAB — BASIC METABOLIC PANEL
ANION GAP: 8 (ref 5–15)
Anion gap: 6 (ref 5–15)
BUN: 17 mg/dL (ref 8–23)
BUN: 16 mg/dL (ref 8–23)
CHLORIDE: 99 mmol/L (ref 98–111)
CO2: 24 mmol/L (ref 22–32)
CO2: 25 mmol/L (ref 22–32)
Calcium: 8.9 mg/dL (ref 8.9–10.3)
Calcium: 8.9 mg/dL (ref 8.9–10.3)
Chloride: 97 mmol/L — ABNORMAL LOW (ref 98–111)
Creatinine, Ser: 0.76 mg/dL (ref 0.44–1.00)
Creatinine, Ser: 0.72 mg/dL (ref 0.44–1.00)
GFR calc non Af Amer: >60 mL/min (ref >60)
GFR calc non Af Amer: >60 mL/min (ref >60)
Glucose, Bld: 99 mg/dL (ref 70–99)
Glucose, Bld: 103 mg/dL — ABNORMAL HIGH (ref 70–99)
POTASSIUM: 3.9 mmol/L (ref 3.5–5.1)
POTASSIUM: 4 mmol/L (ref 3.5–5.1)
SODIUM: 127 mmol/L — AB (ref 135–145)
SODIUM: 132 mmol/L — AB (ref 135–145)

## 2018-03-25 LAB — HEMOGLOBIN A1C
Hgb A1c MFr Bld: 6.1 % — ABNORMAL HIGH (ref 4.8–5.6)
Mean Plasma Glucose: 128 mg/dL

## 2018-03-25 NOTE — Evaluation (Signed)
Occupational Therapy Evaluation Patient Details Name: [name] [name] MRN: [id number] DOB: [birth year] Today's Date: 03/25/2018    History of Present Illness 77 yo female with onset of urinary retention and difficulty walking was readmitted after pubic ramus fracture recently, now has constipation and pain with her back and then abdomen.  Last DC was 10/2.   PMHx:  pubic ramus fracture,  falls, diverticulosis, tobacco abuse, lumbar pain and surgery, varicose veins, hypothyroidism, COPD, UTI,    Clinical Impression   Pt admitted with the above.  Pt currently with functional limitations due to the deficits listed below (see OT Problem List).  Pt will benefit from skilled OT to increase their safety and independence with ADL and functional mobility for ADL to facilitate discharge to venue listed below.      Follow Up Recommendations  SNF          Precautions / Restrictions Precautions Precautions: Fall Precaution Comments: pubic ramus fracture Restrictions Weight Bearing Restrictions: Yes      Mobility Bed Mobility Overal bed mobility: Needs Assistance Bed Mobility: Supine to Sit;Sit to Supine     Supine to sit: Min assist Sit to supine: Min assist   General bed mobility comments: minor help for LE's and to cue her body mechanics  Transfers Overall transfer level: Needs assistance   Transfers: Sit to/from Stand Sit to Stand: Mod assist         General transfer comment: pt declined    Balance Overall balance assessment: Needs assistance Sitting-balance support: Feet supported;Bilateral upper extremity supported Sitting balance-Leahy Scale: Fair                                     ADL either performed or assessed with clinical judgement   ADL Overall ADL's : Needs assistance/impaired Eating/Feeding: Set up   Grooming: Brushing hair;Set up;Sitting   Upper Body Bathing: Minimal assistance;Sitting   Lower Body Bathing: Maximal assistance;Cueing  for sequencing;Cueing for safety;Sitting/lateral leans                         General ADL Comments: After sitting EOB pt declined trying to stand. Pt very focused on DC plans and having a BM      Vision Patient Visual Report: No change from baseline              Pertinent Vitals/Pain Faces Pain Scale: Hurts even more Pain Location: pubic fracture Pain Descriptors / Indicators: Discomfort;Sore Pain Intervention(s): Limited activity within patient's tolerance;Repositioned        Extremity/Trunk Assessment         Cervical / Trunk Assessment Cervical / Trunk Assessment: Kyphotic   Communication Communication Communication: No difficulties   Cognition Arousal/Alertness: Awake/alert Behavior During Therapy: WFL for tasks assessed/performed Overall Cognitive Status: No family/caregiver present to determine baseline cognitive functioning                                                Home Living Family/patient expects to be discharged to:: Private residence Living Arrangements: Spouse/significant other Available Help at Discharge: Family;Available 24 hours/day Type of Home: House Home Access: Stairs to enter [employer] of Steps: 1   Home Layout: One level         Biochemist, clinical: Standard  Home Equipment: [place] - 2 wheels;Cane - single point   Additional Comments: pt has limited help with her husband due to his extended age      Prior Functioning/Environment Level of Independence: Independent                 OT Problem List: Decreased strength;Decreased activity tolerance;Impaired balance (sitting and/or standing);Decreased safety awareness;Decreased knowledge of use of DME or AE      OT Treatment/Interventions: Self-care/ADL training;Patient/family education;DME and/or AE instruction;Therapeutic activities    OT Goals(Current goals can be found in the care plan section) Acute Rehab OT Goals Patient  Stated Goal: to walk and feel stronger OT Goal Formulation: With patient Time For Goal Achievement: 04/08/18  OT Frequency: Min 2X/week   Barriers to D/C: Decreased caregiver support             AM-PAC PT "6 Clicks" Daily Activity     Outcome Measure Help from another person eating meals?: A Little Help from another person taking care of personal grooming?: A Little Help from another person toileting, which includes using toliet, bedpan, or urinal?: Total Help from another person bathing (including washing, rinsing, drying)?: A Lot Help from another person to put on and taking off regular upper body clothing?: A Little Help from another person to put on and taking off regular lower body clothing?: Total 6 Click Score: 13   End of Session Nurse Communication: Mobility status  Activity Tolerance: Patient limited by fatigue Patient left: in bed;with call bell/phone within reach;with bed alarm set  OT Visit Diagnosis: Unsteadiness on feet (R26.81);History of falling (Z91.81);Repeated falls (R29.6);Other abnormalities of gait and mobility (R26.89);Muscle weakness (generalized) (M62.81)                Time: 5929-2446 OT Time Calculation (min): 19 min Charges:  OT General Charges $OT Visit: 1 Visit OT Evaluation $OT Eval Moderate Complexity: 1 Mod  Kari Baars, OT Acute Rehabilitation Services [phone] Office- [phone]     [name] [name], [name] 03/25/2018, 2:52 PM

## 2018-03-25 NOTE — Progress Notes (Addendum)
PROGRESS NOTE  [name] [name]  MOQ:[id number] DOB: September 08, 1940 DOA: 03/23/2018 PCP: Marin Olp, MD  Outpatient Specialists: Drs. Paulla Fore and Lorin Mercy Brief Narrative: [name] [name] is a 77 y.o. female with a history of COPD, ongoing tobacco use, hypothyroidism with incomplete adherence to synthroid, and recent pubic ramus fracture. She had been taking narcotic pain medications for the fracture but still had significant pain and additionally become severely constipated from the opioids though this has resolved with discontinuation of opioids and bowel regimen per PCP. Unfortunately she's had severe low back pain and ambulating less despite advil and tylenol at home and injection by sports medicine. She's been getting around with increasing difficulty, initially using a walker, now often uses a wheelchair and has needed EMS to come to the home to get her up as her 58 year old husband was unable to. On evaluation with her PCP she endorsed decreased per oral intake for which labs were checked. When Na came back at 118 she was contacted and referred to the ED where Na confirmed to be low at 121 and patient admitted. Fluid restriction was imposed, synthroid restarted, and isotonic saline provided with improvement in hyponatremia.  Assessment & Plan: Active Problems:   Hypothyroidism   HYPERCHOLESTEROLEMIA   COPD (chronic obstructive pulmonary disease) (HCC)   Hyponatremia   Debility   Pubic ramus fracture (HCC)   Fall   Urinary retention  Hyponatremia: Multifactorial, possible chronic component as well as the only value from the past year was 121mmol/L. With euvolemia on exam, highest suspicion is for polydipsia due to dry mouth and insufficient solute intake due to debility, undertreated hypothyroidism. Also consider SIADH, less likely due to improvement with isotonic fluids. Could consider opioid effect, less likely with no recent administration. No diuretics, ACE, or recent steroid use. - Fluid  restriction to continue for now - Noted to have worsened overnight, though this is due to patient losing IV access. Restart isotonic IVF this AM at rate of 100cc/hr and check q12h.  - Restart synthroid as below  Hypothyroidism: TSH modestly elevated in the setting of incomplete adherence. The patient does not like the tablet she gets from her new pharmacy, but agrees to take it.  - Synthroid restarted at stable dose, as TSH is improving from prior value and free values of T3, T4 are wnl.   Constipation:  - Senna, miralax.  - Administer suppository today and give enema if ineffective.  - With ongoing need for opioids to facilitate mobility, will increase senna dosing.   Bacteriuria: With +dipstick but no WBC or RBC on micro. Urine culture nonclonal.  - No further treatment indicated.  Deconditioning: Due to decreased exercise/mobility due to pain from pubic ramus fracture initially.  - PT/OT. Anticipate SNF would be appropriate venue due to her elderly husband's limited ability to assist her at home. - Will attempt to control pain a little better to facilitate physical therapy. Bowel regimen as above.  Dry eyes, dry mouth, constipation: Per report these are acute on longstanding problems.  - No h/o systemic rheumatic disease, though serology could be considered (e.g. anti-Ro/SSA, anti-La/SSB) for further evaluation of Sjogren's. These would not be directly germane to the reason for admission, so are deferred to PCP's discretion. - Continue eye drops, can use sponge for dry mouth, but reviewed fluid restriction with patient again today.  - Bowel regimen as above.  Tobacco use:  - Cessation counseling provided at admission and this morning.  - Trial nicorette gum. Pt  had cutaneous-only reaction to patch and was given allergy designation to nicotine. This is clearly not the case as she continues to smoke, but instead a contact dermatitis (irritant vs. allergy) of patch adhesive, so gum is felt  to have low risk of reaction.   COPD: No exacerbation.  - Prn's  Insomnia:  - Continue trial trazodone  GERD:  - PPI  DVT prophylaxis: SCDs Code Status: Full Family Communication: Husband at bedside Disposition Plan: Anticipate she will be medically stable for discharge to SNF in next 1-2 days.  Consultants:   PT, OT, CSW  Procedures:   None  Antimicrobials:  None   Subjective: Had a bad night. Lost IV and has not had BM. Frustrated. Pain seems to be better controlled though she's gotten up rarely. No HA.   Objective: Vitals:   03/24/18 0422 03/24/18 1331 03/24/18 2013 03/25/18 0459  BP: 128/60 124/69 128/72 134/76  Pulse: 70 71 70 72  Resp: 16 16 18 20   Temp: 98 F (36.7 C) 98.4 F (36.9 C) 98.4 F (36.9 C) 98.6 F (37 C)  TempSrc: Oral Oral Oral Oral  SpO2: 98% 97% 94% 96%  Weight:      Height:        Intake/Output Summary (Last 24 hours) at 03/25/2018 1449 Last data filed at 03/25/2018 1228 Gross per 24 hour  Intake 2250.7 ml  Output 2300 ml  Net -49.3 ml   Filed Weights   03/23/18 0403 03/23/18 1029  Weight: 67.1 kg 66.5 kg   Gen: Talkative elderly female in no distress Pulm: Nonlabored breathing room air. Clear. CV: Regular rate and rhythm. No murmur, rub, or gallop. No JVD, no dependent edema. GI: Abdomen soft, non-tender, non-distended, with normoactive bowel sounds.  MSK: Warm extremities with no deformities. Spine without stepoffs, +paraspinal spasm. Skin: No rashes, lesions or ulcers on visualized skin.  Neuro: Alert and oriented. No focal neurological deficits. Psych: Judgement and insight appear fair. Mood euthymic & affect congruent. Behavior is appropriate.    Data Reviewed: I have personally reviewed following labs and imaging studies  CBC: Recent Labs  Lab 03/22/18 1521 03/23/18 0411 03/24/18 0154  WBC 7.0 9.9 6.7  NEUTROABS  --  8.0*  --   HGB 12.1 12.3 12.1  HCT 34.0* 34.2* 35.4*  MCV 81.1 81.6 83.9  PLT 398 383 405*    Basic Metabolic Panel: Recent Labs  Lab 03/23/18 1827 03/24/18 0154 03/24/18 0958 03/24/18 1642 03/25/18 0504  NA 127* 126* 130* 130* 127*  K 3.4* 3.5 3.8 3.7 3.9  CL 94* 91* 96* 97* 97*  CO2 23 26 26 23 24   GLUCOSE 130* 102* 89 95 99  BUN 15 13 11 15 17   CREATININE 0.76 0.80 0.75 0.80 0.76  CALCIUM 9.3 9.2 9.6 9.4 8.9  MG  --  2.3  --   --   --   PHOS  --  2.1*  --   --   --    GFR: Estimated Creatinine Clearance: 55.5 mL/min (by C-G formula based on SCr of 0.76 mg/dL). Liver Function Tests: Recent Labs  Lab 03/22/18 1521 03/23/18 0411 03/24/18 0154  AST 24 30 27   ALT 14 17 17   ALKPHOS  --  198* 178*  BILITOT 0.5 0.3 0.7  PROT 6.3 6.8 6.3*  ALBUMIN  --  3.7 3.4*   No results for input(s): LIPASE, AMYLASE in the last 168 hours. No results for input(s): AMMONIA in the last 168 hours. Coagulation Profile: No results  for input(s): INR, PROTIME in the last 168 hours. Cardiac Enzymes: No results for input(s): CKTOTAL, CKMB, CKMBINDEX, TROPONINI in the last 168 hours. BNP (last 3 results) No results for input(s): PROBNP in the last 8760 hours. HbA1C: Recent Labs    03/24/18 0154  HGBA1C 6.1*   CBG: No results for input(s): GLUCAP in the last 168 hours. Lipid Profile: No results for input(s): CHOL, HDL, LDLCALC, TRIG, CHOLHDL, LDLDIRECT in the last 72 hours. Thyroid Function Tests: Recent Labs    03/23/18 0411 03/23/18 1055  TSH 6.213*  --   FREET4  --  1.16  T3FREE  --  2.4   Anemia Panel: No results for input(s): VITAMINB12, FOLATE, FERRITIN, TIBC, IRON, RETICCTPCT in the last 72 hours. Urine analysis:    Component Value Date/Time   COLORURINE STRAW (A) 03/23/2018 0526   APPEARANCEUR CLEAR 03/23/2018 0526   LABSPEC 1.002 (L) 03/23/2018 0526   PHURINE 7.0 03/23/2018 0526   GLUCOSEU NEGATIVE 03/23/2018 0526   GLUCOSEU NEGATIVE 01/28/2018 1055   HGBUR NEGATIVE 03/23/2018 0526   BILIRUBINUR NEGATIVE 03/23/2018 0526   BILIRUBINUR n 07/16/2015 0918    KETONESUR NEGATIVE 03/23/2018 0526   PROTEINUR NEGATIVE 03/23/2018 0526   UROBILINOGEN 0.2 01/28/2018 1055   NITRITE NEGATIVE 03/23/2018 0526   LEUKOCYTESUR LARGE (A) 03/23/2018 0526   Recent Results (from the past 240 hour(s))  Urine Culture     Status: Abnormal   Collection Time: 03/23/18  5:26 AM  Result Value Ref Range Status   Specimen Description   Final    URINE, CLEAN CATCH Performed at [facility], [place] [address] [place], [place] [address]    Special Requests   Final    NONE Performed at [facility], [place] [address], [place], [place] [address]    Culture MULTIPLE SPECIES PRESENT, SUGGEST RECOLLECTION (A)  Final   Report Status 03/24/2018 FINAL  Final      Radiology Studies: No results found.  Scheduled Meds: . levothyroxine  75 mcg Oral QAC breakfast  . mouth rinse  15 mL Mouth Rinse BID  . pantoprazole  40 mg Oral Daily  . polyethylene glycol  17 g Oral Daily  . senna  1 tablet Oral BID   Continuous Infusions: . sodium chloride 100 mL/hr at 03/25/18 1200     LOS: 0 days   Time spent: 25 minutes.  Patrecia Pour, MD [facility] [email or web address] Password [email or web address] 03/25/2018, 2:49 PM

## 2018-03-25 NOTE — Plan of Care (Signed)
  Problem: Nutrition: Goal: Adequate nutrition will be maintained Outcome: Progressing   Problem: Safety: Goal: Ability to remain free from injury will improve Outcome: Progressing   Problem: Elimination: Goal: Will not experience complications related to bowel motility Outcome: Progressing   

## 2018-03-26 LAB — BASIC METABOLIC PANEL
ANION GAP: 9 (ref 5–15)
Anion gap: 8 (ref 5–15)
BUN: 15 mg/dL (ref 8–23)
BUN: 19 mg/dL (ref 8–23)
CHLORIDE: 100 mmol/L (ref 98–111)
CHLORIDE: 99 mmol/L (ref 98–111)
CO2: 25 mmol/L (ref 22–32)
CO2: 24 mmol/L (ref 22–32)
Calcium: 9 mg/dL (ref 8.9–10.3)
Calcium: 9.4 mg/dL (ref 8.9–10.3)
Creatinine, Ser: 0.66 mg/dL (ref 0.44–1.00)
Creatinine, Ser: 0.78 mg/dL (ref 0.44–1.00)
GFR calc Af Amer: >60 mL/min (ref >60)
GFR calc Af Amer: >60 mL/min (ref >60)
GFR calc non Af Amer: >60 mL/min (ref >60)
GFR calc non Af Amer: >60 mL/min (ref >60)
GLUCOSE: 96 mg/dL (ref 70–99)
GLUCOSE: 127 mg/dL — AB (ref 70–99)
POTASSIUM: 3.7 mmol/L (ref 3.5–5.1)
POTASSIUM: 4.2 mmol/L (ref 3.5–5.1)
Sodium: 133 mmol/L — ABNORMAL LOW (ref 135–145)
Sodium: 132 mmol/L — ABNORMAL LOW (ref 135–145)

## 2018-03-26 MED ORDER — NICOTINE POLACRILEX 2 MG MT GUM: 2.0000 mg | CHEWING_GUM | OROMUCOSAL | 0 refills | Status: AC | PRN

## 2018-03-26 MED ORDER — HYDROCODONE-ACETAMINOPHEN 5-325 MG PO TABS: 1.0000 | ORAL_TABLET | Freq: Four times a day (QID) | ORAL | 0 refills | Status: DC | PRN

## 2018-03-26 MED ORDER — SENNA 8.6 MG PO TABS: 1.0000 | ORAL_TABLET | Freq: Two times a day (BID) | ORAL | 0 refills | Status: AC

## 2018-03-26 MED ORDER — POLYETHYLENE GLYCOL 3350 17 G PO PACK: 17.0000 g | PACK | Freq: Every day | ORAL | 0 refills | Status: DC

## 2018-03-26 NOTE — Discharge Summary (Signed)
Physician Discharge Summary  [name] [name] MPN:[id number] DOB: 16-Feb-1941 DOA: 03/23/2018  PCP: Marin Olp, MD  Admit date: 03/23/2018 Discharge date: 03/26/2018  Admitted From: Home Disposition: SNF   Recommendations for Outpatient Follow-up:  1. Follow up with PCP in 1-2 weeks with repeat CBC, BMP 2. Consider further work up of constellation of dry eyes, dry mouth, chronic constipation. 3. Continue bowel regimen to prevent constipation while ongoing pain management required for pelvic fracture.   Home Health: N/A Equipment/Devices: Per SNF Discharge Condition: Stable CODE STATUS: Full Diet recommendation: Fluid restriction  Brief/Interim Summary: [name] [name] is a 77 y.o. female with a history of COPD, ongoing tobacco use, hypothyroidism with incomplete adherence to synthroid, and recent pubic ramus fracture. She had been taking narcotic pain medications for the fracture but still had significant pain and additionally become severely constipated from the opioids though this has resolved with discontinuation of opioids and bowel regimen per PCP. Unfortunately she's had severe low back pain and ambulating less despite advil and tylenol at home and injection by sports medicine. She's been getting around with increasing difficulty, initially using a walker, now often uses a wheelchair and has needed EMS to come to the home to get her up as her 30 year old husband was unable to. On evaluation with her PCP she endorsed decreased per oral intake for which labs were checked. When Na came back at 118 she was contacted and referred to the ED where Na confirmed to be low at 121 and patient admitted. Fluid restriction was imposed, synthroid restarted, and isotonic saline provided with improvement in hyponatremia to what is likely her baseline at 187mmol/L. Constipation has been treated aggressively and is resolved. [name] [name] has recommended ongoing therapy at SNF.  Discharge Diagnoses:  Active  Problems:   Hypothyroidism   HYPERCHOLESTEROLEMIA   COPD (chronic obstructive pulmonary disease) (HCC)   Hyponatremia   Debility   Pubic ramus fracture (HCC)   Fall   Urinary retention  Hyponatremia: Multifactorial, possible chronic component as well as the only value from the past year was 175mmol/L. With euvolemia on exam, highest suspicion is for polydipsia due to dry mouth and insufficient solute intake due to debility, undertreated hypothyroidism. No diuretics, ACE, or recent steroid use. - Fluid restriction to continue for now - Monitor BMP closely, preferably recheck in next 3 days.  - Restart synthroid as below  Hypothyroidism: TSH modestly elevated in the setting of incomplete adherence. The patient does not like the tablet she gets from her new pharmacy, but agrees to take it.  - Synthroid restarted at stable dose, as TSH is improving from prior value and free values of T3, T4 are wnl.   Constipation:  - Aggressive regimen to treat acute (due to opioids, immobility) on chronic constipation. Daily senna, miralax, and prn dulcolax suppository have been effective.  - Mobilize  Bacteriuria: With +dipstick but no WBC or RBC on micro. Urine culture nonclonal.  - No further treatment indicated.  Deconditioning: Due to decreased exercise/mobility due to pain from pubic ramus fracture initially.  - [name] [name]/OT. DC to SNF especially in light of her elderly husband's limited ability to assist her at home. - Will attempt to control pain a little better to facilitate physical therapy. Bowel regimen as above.  Dry eyes, dry mouth, constipation: Per report these are acute on longstanding problems.  - No h/o systemic rheumatic disease, though serology could be considered (e.g. anti-Ro/SSA, anti-La/SSB) for further evaluation of Sjogren's. These would not be  directly germane to the reason for admission, so are deferred to PCP's discretion. - Continue eye drops, can use sponge for dry mouth,  but reviewed fluid restriction with patient again today.  - Bowel regimen as above.  Tobacco use:  - Cessation counseling provided at admission and this morning.  - Trialed nicorette gum with no/modest benefit. [name] [name] had cutaneous-only reaction to patch and was given allergy designation to nicotine. This is clearly not the case as she continues to smoke, but instead a contact dermatitis (irritant vs. allergy) of patch adhesive, so gum is felt to have low risk of reaction.   COPD: No exacerbation.  - Prn's  Insomnia:  - Continue trial trazodone  GERD:  - PPI  Discharge Instructions Discharge Instructions    Diet general   Complete by:  As directed    with fluid restriction.   Increase activity slowly   Complete by:  As directed      Allergies as of 03/26/2018      Reactions   Atorvastatin    Intolerant to all statins   Cyclobenzaprine Other (See Comments)   Bradycardia, hypotension   Ezetimibe     INTOL to Zetia   Rosuvastatin    intolerant to all statins   Gadolinium Derivatives Nausea Only   [name] [name] became very nauseous after gadolinium administration//lh   Nicoderm [nicotine] Rash   Unable to use the patches---caused severe rash to area placed      Medication List    STOP taking these medications   acetaminophen 500 MG tablet Commonly known as:  TYLENOL     TAKE these medications   HYDROcodone-acetaminophen 5-325 MG tablet Commonly known as:  NORCO/VICODIN Take 1-2 tablets by mouth every 6 (six) hours as needed for moderate pain or severe pain.   levothyroxine 75 MCG tablet Commonly known as:  SYNTHROID, LEVOTHROID TAKE 1 TABLET(75 MCG) BY MOUTH DAILY   naproxen sodium 220 MG tablet Commonly known as:  ALEVE Take 220 mg by mouth at bedtime.   nicotine polacrilex 2 MG gum Commonly known as:  NICORETTE Take 1 each (2 mg total) by mouth as needed for smoking cessation.   omeprazole 20 MG capsule Commonly known as:  PRILOSEC Take 1 capsule (20 mg total) by  mouth daily.   ondansetron 4 MG disintegrating tablet Commonly known as:  ZOFRAN-ODT Take 1 tablet (4 mg total) by mouth every 8 (eight) hours as needed for nausea or vomiting.   polyethylene glycol packet Commonly known as:  MIRALAX / GLYCOLAX Take 17 g by mouth daily.   senna 8.6 MG Tabs tablet Commonly known as:  SENOKOT Take 1 tablet (8.6 mg total) by mouth 2 (two) times daily.      Follow-up Information    Marin Olp, MD. Schedule an appointment as soon as possible for a visit in 1 week(s).   Specialty:  Family Medicine Contact information: [place] [id number] [phone]          Allergies  Allergen Reactions  . Atorvastatin     Intolerant to all statins   . Cyclobenzaprine Other (See Comments)    Bradycardia, hypotension  . Ezetimibe      INTOL to Zetia  . Rosuvastatin     intolerant to all statins  . Gadolinium Derivatives Nausea Only    [name] [name] became very nauseous after gadolinium administration//lh  . Nicoderm [Nicotine] Rash    Unable to use the patches---caused severe rash to area placed  Consultations:  None  Procedures/Studies: Dg Chest 2 View  Result Date: 03/23/2018 CLINICAL DATA:  Hyponatremia. EXAM: CHEST - 2 VIEW COMPARISON:  Radiograph 04/06/2017 FINDINGS: The cardiomediastinal contours are normal. Chronic hyperinflation and bronchitic change pulmonary vasculature is normal. No consolidation, pleural effusion, or pneumothorax. No acute osseous abnormalities are seen. IMPRESSION: Chronic hyperinflation and bronchial thickening consistent with COPD. No superimposed acute abnormality. Electronically Signed   By: Keith Rake M.D.   On: 03/23/2018 04:40   Dg Abd 1 View  Result Date: 03/23/2018 CLINICAL DATA:  [name] [name] stated that she had been constipated and 2 weeks ago her orthopedic surgeon had given her medications to clean her bowels out. [name] [name] is complaining of feeling weak. [name] [name] stated she has also had urinary  frequency over the last 2 days. EXAM: ABDOMEN - 1 VIEW COMPARISON:  03/21/2018 FINDINGS: Stomach and small bowel decompressed. Normal distribution of gas and stool throughout the nondilated colon. No abnormal abdominal calcifications. Stable spondylitic changes in the lumbar spine. Advanced DJD in the right hip. IMPRESSION: Normal bowel gas pattern. Electronically Signed   By: Lucrezia Europe M.D.   On: 03/23/2018 09:02   Dg Abd 1 View  Result Date: 03/22/2018 CLINICAL DATA:  Constipation EXAM: ABDOMEN - 1 VIEW COMPARISON:  CT pelvis 02/27/2018.  KUB 04/06/2017 FINDINGS: Normal bowel gas pattern. Mild amount of stool in the colon. Negative for bowel obstruction or ileus. Negative for renal calculi Lumbar scoliosis and multilevel degenerative change. Advanced degenerative change in the right hip. Nondisplaced fractures in the right pubic rami are better seen on CT. No displaced fracture on today's study. IMPRESSION: Normal bowel gas pattern with mild stool in the colon. Electronically Signed   By: Franchot Gallo M.D.   On: 03/22/2018 07:56   Ct Pelvis Wo Contrast  Result Date: 02/27/2018 CLINICAL DATA:  Right hip and buttock pain for 3 weeks. EXAM: CT PELVIS WITHOUT CONTRAST TECHNIQUE: Multidetector CT imaging of the pelvis was performed following the standard protocol without intravenous contrast. COMPARISON:  None. FINDINGS: Urinary Tract:  No abnormality visualized. Bowel: Diverticulosis of colon is noted. There is no diverticulitis on visualized images. Vascular/Lymphatic: Atherosclerosis of the vessels are identified. Reproductive:  No mass or other significant abnormality Other:  None. Musculoskeletal: There is healing fracture of the right inferior pubic rami. There is fracture with some healing in the anterior aspect of right acetabulum/lateral aspect of the right superior pubic rami. Degenerative joint changes of the spine are noted. IMPRESSION: Healing fractures of the right pelvis as described.  Electronically Signed   By: Abelardo Diesel M.D.   On: 02/27/2018 14:04   Dg Bone Density  Result Date: 03/10/2018 Date of study: 03/04/18 Exam: DUAL X-RAY ABSORPTIOMETRY (DXA) FOR BONE MINERAL DENSITY (BMD) Instrument: [employer] Requesting Provider: PCP Indication: screening for osteoporosis Comparison: none (please note that it is not possible to compare data from different instruments) Clinical data: [name] [name] is a 77 y.o. female with previous fractures. Results:  Lumbar spine L1-L4 Femoral neck (FN) 33% distal radius T-score 1.2 RFN: 1.1 LFN: 0.6 n/a Change in BMD from previous DXA test (%) n/a n/a n/a (*) statistically significant Assessment: the BMD is normal according to the [facility] classification for osteoporosis (see below). Fracture risk: low FRAX score: not calculated due to normal BMD Comments: the technical quality of the study is good  L4 was excluded due to degenerative change. Recommend optimizing calcium (1200 mg/day) and vitamin D (800 IU/day) intake. No pharmacological treatment is indicated. Followup: Repeat BMD  is appropriate after 2 years. WHO criteria for diagnosis of osteoporosis in postmenopausal women and in men 75 y/o or older: - normal: T-score -1.0 to + 1.0 - osteopenia/low bone density: T-score between -2.5 and -1.0 - osteoporosis: T-score below -2.5 - severe osteoporosis: T-score below -2.5 with history of fragility fracture Note: although not part of the WHO classification, the presence of a fragility fracture, regardless of the T-score, should be considered diagnostic of osteoporosis, provided other causes for the fracture have been excluded. Loura Pardon MD    Subjective: Pain is better controlled, had 2 BMs yesterday, no seizure/tremor, HA, fatigue. Eating well, frustrated by fluid restriction.  Discharge Exam: Vitals:   03/26/18 0459 03/26/18 0502  BP: (!) 167/79 (!) 155/72  Pulse: 73 69  Resp: 18   Temp: 97.9 F (36.6 C)   SpO2: 94% 94%   General: [name] [name] is alert,  awake, not in acute distress Cardiovascular: RRR, S1/S2 +, no rubs, no gallops Respiratory: CTA bilaterally, no wheezing, no rhonchi Abdominal: Soft, NT, ND, bowel sounds + Extremities: No edema, no cyanosis  Labs: BNP (last 3 results) No results for input(s): BNP in the last 8760 hours. Basic Metabolic Panel: Recent Labs  Lab 03/24/18 0154 03/24/18 0958 03/24/18 1642 03/25/18 0504 03/25/18 1650 03/26/18 0408  NA 126* 130* 130* 127* 132* 133*  K 3.5 3.8 3.7 3.9 4.0 3.7  CL 91* 96* 97* 97* 99 100  CO2 26 26 23 24 25 25   GLUCOSE 102* 89 95 99 103* 96  BUN 13 11 15 17 16 15   CREATININE 0.80 0.75 0.80 0.76 0.72 0.66  CALCIUM 9.2 9.6 9.4 8.9 8.9 9.0  MG 2.3  --   --   --   --   --   PHOS 2.1*  --   --   --   --   --    Liver Function Tests: Recent Labs  Lab 03/22/18 1521 03/23/18 0411 03/24/18 0154  AST 24 30 27   ALT 14 17 17   ALKPHOS  --  198* 178*  BILITOT 0.5 0.3 0.7  PROT 6.3 6.8 6.3*  ALBUMIN  --  3.7 3.4*   No results for input(s): LIPASE, AMYLASE in the last 168 hours. No results for input(s): AMMONIA in the last 168 hours. CBC: Recent Labs  Lab 03/22/18 1521 03/23/18 0411 03/24/18 0154  WBC 7.0 9.9 6.7  NEUTROABS  --  8.0*  --   HGB 12.1 12.3 12.1  HCT 34.0* 34.2* 35.4*  MCV 81.1 81.6 83.9  PLT 398 383 405*   Cardiac Enzymes: No results for input(s): CKTOTAL, CKMB, CKMBINDEX, TROPONINI in the last 168 hours. BNP: Invalid input(s): POCBNP CBG: No results for input(s): GLUCAP in the last 168 hours. D-Dimer No results for input(s): DDIMER in the last 72 hours. Hgb A1c Recent Labs    03/24/18 0154  HGBA1C 6.1*   Lipid Profile No results for input(s): CHOL, HDL, LDLCALC, TRIG, CHOLHDL, LDLDIRECT in the last 72 hours. Thyroid function studies Recent Labs    03/23/18 1055  T3FREE 2.4   Anemia work up No results for input(s): VITAMINB12, FOLATE, FERRITIN, TIBC, IRON, RETICCTPCT in the last 72 hours. Urinalysis    Component Value Date/Time    COLORURINE STRAW (A) 03/23/2018 0526   APPEARANCEUR CLEAR 03/23/2018 0526   LABSPEC 1.002 (L) 03/23/2018 0526   PHURINE 7.0 03/23/2018 0526   GLUCOSEU NEGATIVE 03/23/2018 0526   GLUCOSEU NEGATIVE 01/28/2018 1055   HGBUR NEGATIVE 03/23/2018 0526   BILIRUBINUR NEGATIVE  03/23/2018 0526   BILIRUBINUR n 07/16/2015 0918   KETONESUR NEGATIVE 03/23/2018 0526   PROTEINUR NEGATIVE 03/23/2018 0526   UROBILINOGEN 0.2 01/28/2018 1055   NITRITE NEGATIVE 03/23/2018 0526   LEUKOCYTESUR LARGE (A) 03/23/2018 0526    Microbiology Recent Results (from the past 240 hour(s))  Urine Culture     Status: Abnormal   Collection Time: 03/23/18  5:26 AM  Result Value Ref Range Status   Specimen Description   Final    URINE, CLEAN CATCH Performed at [facility], [place] [address], [place], [place] [address]    Special Requests   Final    NONE Performed at [facility], [place] [address], [place], [place] [address]    Culture MULTIPLE SPECIES PRESENT, SUGGEST RECOLLECTION (A)  Final   Report Status 03/24/2018 FINAL  Final    Time coordinating discharge: Approximately 40 minutes  Patrecia Pour, MD  [facility] 03/26/2018, 10:54 AM Pager [phone]

## 2018-03-26 NOTE — [place] FL2 (Signed)
[place] LEVEL OF CARE SCREENING TOOL     IDENTIFICATION  Patient Name: [name] [name] Birthdate: [birth year] Sex: female Admission Date (Current Location): 03/23/2018  [facility] and [place] Number:  Herbalist and Address:  [facility],  [place] [place], [place]      Provider Number: [id number]  Attending Physician Name and Address:  Patrecia Pour, MD  Relative Name and Phone Number:  [name] [name]:     Current Level of Care: Hospital Recommended Level of Care: [place] Prior Approval Number:    Date Approved/Denied:   PASRR Number: [id number]  Discharge Plan: SNF    Current Diagnoses: Patient Active Problem List   Diagnosis Date Noted  . Hyponatremia 03/23/2018  . Debility 03/23/2018  . Pubic ramus fracture ([place]) 03/23/2018  . Fall 03/23/2018  . Urinary retention 03/23/2018  . Primary osteoarthritis of right hip 02/07/2018  . Belching 02/01/2018  . Varicose veins 12/02/2013  . OA (osteoarthritis) 08/15/2012  . Hearing loss 08/16/2009  . Hypothyroidism 10/12/2007  . CIGARETTE SMOKER 10/09/2007  . HYPERCHOLESTEROLEMIA 10/08/2007  . COPD (chronic obstructive pulmonary disease) ([place]) 10/08/2007  . BACK PAIN, LUMBAR 10/08/2007    Orientation RESPIRATION BLADDER Height & Weight     Self, Time, Situation, Place  Normal Continent Weight: 146 lb 9.7 oz (66.5 kg) Height:  5\' 7"  (170.2 cm)  BEHAVIORAL SYMPTOMS/MOOD NEUROLOGICAL BOWEL NUTRITION STATUS      Continent Diet(Regular)  AMBULATORY STATUS COMMUNICATION OF NEEDS Skin   Limited Assist Verbally Normal                       Personal Care Assistance Level of Assistance  Bathing, Dressing, Feeding Bathing Assistance: Maximum assistance Feeding assistance: Independent Dressing Assistance: Limited assistance     Functional Limitations Info  Speech, Hearing, Sight Sight Info: Adequate Hearing Info: Impaired(HOH) Speech Info: Adequate     SPECIAL CARE FACTORS FREQUENCY  PT (By licensed PT), OT (By licensed OT)     PT Frequency: 5x/week OT Frequency: 5x/week            Contractures Contractures Info: Not present    Additional Factors Info  Code Status, Allergies Code Status Info: Full Allergies Info: ATORVASTATIN, CYCLOBENZAPRINE, EZETIMIBE, ROSUVASTATIN, GADOLINIUM DERIVATIVES, NICODERM NICOTINE            Current Medications (03/26/2018):  This is the current hospital active medication list Current Facility-Administered Medications  Medication Dose Route Frequency Provider Last Rate Last Dose  . acetaminophen (TYLENOL) tablet 650 mg  650 mg Oral Q6H PRN Toy Baker, MD       Or  . acetaminophen (TYLENOL) suppository 650 mg  650 mg Rectal Q6H PRN Doutova, [name], MD      . bisacodyl (DULCOLAX) suppository 10 mg  10 mg Rectal Daily PRN Toy Baker, MD   10 mg at 03/25/18 1223  . HYDROcodone-acetaminophen (NORCO/VICODIN) 5-325 MG per tablet 1-2 tablet  1-2 tablet Oral Q4H PRN Patrecia Pour, MD   1 tablet at 03/26/18 1130  . levothyroxine (SYNTHROID, LEVOTHROID) tablet 75 mcg  75 mcg Oral QAC breakfast Toy Baker, MD   75 mcg at 03/26/18 0821  . MEDLINE mouth rinse  15 mL Mouth Rinse BID Doutova, [name], MD   15 mL at 03/26/18 0900  . nicotine polacrilex (NICORETTE) gum 2 mg  2 mg Oral PRN Patrecia Pour, MD   2 mg at 03/24/18 1706  . ondansetron (ZOFRAN) tablet 4  mg  4 mg Oral Q6H PRN Toy Baker, MD       Or  . ondansetron (ZOFRAN) injection 4 mg  4 mg Intravenous Q6H PRN Doutova, [name], MD      . pantoprazole (PROTONIX) EC tablet 40 mg  40 mg Oral Daily Doutova, [name], MD   40 mg at 03/26/18 0821  . polyethylene glycol (MIRALAX / GLYCOLAX) packet 17 g  17 g Oral Daily Patrecia Pour, MD   17 g at 03/25/18 1100  . senna (SENOKOT) tablet 8.6 mg  1 tablet Oral BID Toy Baker, MD   8.6 mg at 03/25/18 2159  . traMADol (ULTRAM) tablet 50 mg  50 mg Oral Q6H  PRN Patrecia Pour, MD   50 mg at 03/26/18 1130  . traZODone (DESYREL) tablet 50 mg  50 mg Oral QHS PRN Patrecia Pour, MD   50 mg at 03/24/18 2102     Discharge Medications: Please see discharge summary for a list of discharge medications.  Relevant Imaging Results:  Relevant Lab Results:   Additional Information SSN: [id number]  Pricilla Holm, [place]

## 2018-03-26 NOTE — Progress Notes (Signed)
Physical Therapy Treatment Patient Details Name: [name] [name] MRN: [id number] DOB: 11/12/1940 Today's Date: 03/26/2018    History of Present Illness 77 yo female with onset of urinary retention and difficulty walking was readmitted after pubic ramus fracture recently, now has constipation and pain with her back and then abdomen.  Last DC was 10/2.   PMHx:  pubic ramus fracture,  falls, diverticulosis, tobacco abuse, lumbar pain and surgery, varicose veins, hypothyroidism, COPD, UTI,     PT Comments    Pt presents with generalized weakness, decreased balance, and decreased amb. Pt was able to amb down the hall with min assist for safety and stability. Pt is progressing toward her goals and will benefit from skilled PT while in the acute care setting.  Follow Up Recommendations  SNF     Equipment Recommendations  Rolling walker with 5" wheels    Recommendations for Other Services       Precautions / Restrictions Precautions Precautions: Fall Precaution Comments: pubic ramus fracture    Mobility  Bed Mobility Overal bed mobility: Needs Assistance Bed Mobility: Supine to Sit     Supine to sit: Min assist     General bed mobility comments: Pt required min assist for saftey and stability   Transfers Overall transfer level: Needs assistance Equipment used: Rolling walker (2 wheeled) Transfers: Sit to/from Stand Sit to Stand: Min assist         General transfer comment: Pt required min assist to move from sit to stand for safety and stability.  Ambulation/Gait Ambulation/Gait assistance: Min assist Gait Distance (Feet): 50 Feet Assistive device: Rolling walker (2 wheeled) Gait Pattern/deviations: Step-to pattern;Step-through pattern;Decreased stride length;Trunk flexed;Wide base of support     General Gait Details: Pt required min assist for safety and stability to walk down the hall. Pt reported no pain when walking and reported the pain meds helped allow her to  move.   Stairs             Wheelchair Mobility    Modified Rankin (Stroke Patients Only)       Balance Overall balance assessment: Needs assistance Sitting-balance support: Bilateral upper extremity supported;Feet supported Sitting balance-Leahy Scale: Fair     Standing balance support: Bilateral upper extremity supported Standing balance-Leahy Scale: Poor Standing balance comment: Pt required min assist and use of the walker to maintain safe and stable standing balance.                            Cognition Arousal/Alertness: Awake/alert Behavior During Therapy: WFL for tasks assessed/performed Overall Cognitive Status: Within Functional Limits for tasks assessed                                        Exercises Total Joint Exercises Ankle Circles/Pumps: AROM;Both;10 reps Short Arc Quad: AROM;Both;10 reps Hip ABduction/ADduction: AROM;Both;10 reps    General Comments General comments (skin integrity, edema, etc.): Pts husband was in the room for therapy.       Pertinent Vitals/Pain Pain Assessment: Faces Faces Pain Scale: Hurts a little bit Pain Location: Pt reports that her pain decreased with pain medication. Pain Intervention(s): Monitored during session    Home Living                      Prior Function  PT Goals (current goals can now be found in the care plan section) Acute Rehab PT Goals PT Goal Formulation: With patient/family Potential to Achieve Goals: Good Progress towards PT goals: Progressing toward goals    Frequency    Min 3X/week      PT Plan Current plan remains appropriate    Co-evaluation              AM-PAC PT "6 Clicks" Daily Activity  Outcome Measure  Difficulty turning over in bed (including adjusting bedclothes, sheets and blankets)?: A Lot Difficulty moving from lying on back to sitting on the side of the bed? : A Lot Difficulty sitting down on and standing up  from a chair with arms (e.g., wheelchair, bedside commode, etc,.)?: A Lot Help needed moving to and from a bed to chair (including a wheelchair)?: A Lot Help needed walking in hospital room?: A Lot Help needed climbing 3-5 steps with a railing? : Total 6 Click Score: 11    End of Session Equipment Utilized During Treatment: Gait belt Activity Tolerance: Patient tolerated treatment well Patient left: in chair;with bed alarm set;with call bell/phone within reach;with family/visitor present   PT Visit Diagnosis: Other abnormalities of gait and mobility (R26.89);Muscle weakness (generalized) (M62.81);Difficulty in walking, not elsewhere classified (R26.2);Adult, failure to thrive (R62.7) Pain - part of body: Hip     Time: 1422-1445 PT Time Calculation (min) (ACUTE ONLY): 23 min  Charges:  $Gait Training: 8-22 mins $Therapeutic Exercise: 8-22 mins                     [employer] SPT 03/26/2018    Rolland Porter 03/26/2018, 4:57 PM

## 2018-03-26 NOTE — Clinical Social Work Note (Signed)
Clinical Social Work Assessment  Patient Details  Name: [name] [name] MRN: [id number] Date of Birth: Apr 17, 1941  Date of referral:  03/24/18               Reason for consult:  Facility Placement                Permission sought to share information with:  Facility Art therapist granted to share information::  Yes, Verbal Permission Granted  Name::     [place]::  SNF  Relationship::  Spouse  Contact Information:  [phone]  Housing/Transportation Living arrangements for the past 2 months:  [place] of Information:  Patient, Spouse Patient Interpreter Needed:  None Criminal Activity/Legal Involvement Pertinent to Current Situation/Hospitalization:  No - Comment as needed Significant Relationships:  Spouse Lives with:  Spouse Do you feel safe going back to the place where you live?  Yes Need for family participation in patient care:  No (Coment)  Care giving concerns:  Patient lives at home with spouse and will need short term therapy to regain strength before returning home.   Social Worker assessment / plan:  CSW met with patient and spouse, Mel, at bedside to discuss discharge plans. Patient lives with spouse at home in [place]. She has a history of COPD, ongoing tobacco use, hypothyroidism with incomplete adherence to synthroid, and recent pubic ramus fracture per her chart.  Patient is open and agreeable to SNF placement. CSW explained process of referrals and insurance authorization. Patient will need BCBS auth before discharge.   CSW will complete FL2, send out referrals, and coordinate discharge.  Employment status:  Retired Nurse, adult PT Recommendations:  [place] / Referral to community resources:  [place]  Patient/Family's Response to care:  Patient was excited to report she has made progress with her pain and movement since being in the  hospital. She is looking forward to going to rehab to get stronger.  Patient/Family's Understanding of and Emotional Response to Diagnosis, Current Treatment, and Prognosis:  Patient and spouse understand discharge process and SNF referral process.  Emotional Assessment Appearance:  Appears stated age Attitude/Demeanor/Rapport:  Engaged Affect (typically observed):  Appropriate, Pleasant Orientation:  Oriented to Self, Oriented to Place, Oriented to  Time, Oriented to Situation Alcohol / Substance use:  Not Applicable Psych involvement (Current and /or in the community):  No (Comment)  Discharge Needs  Concerns to be addressed:  Care Coordination Readmission within the last 30 days:  No Current discharge risk:  Physical Impairment Barriers to Discharge:  [place], [place] 03/26/2018, 11:31 AM

## 2018-03-26 NOTE — Telephone Encounter (Signed)
Looks like pt has been admitted for hyponatremia and abd distension.

## 2018-03-26 NOTE — Progress Notes (Signed)
Patient has chosen [facility] for SNF. [employer] authorization is pending. CSW will update medical team when Josem Kaufmann is received.    Pricilla Holm, MSW, [place] Social Work [phone]

## 2018-03-27 DIAGNOSIS — E871 Hypo-osmolality and hyponatremia: Secondary | ICD-10-CM | POA: Diagnosis not present

## 2018-03-27 DIAGNOSIS — Z79890 Hormone replacement therapy (postmenopausal): Secondary | ICD-10-CM | POA: Diagnosis not present

## 2018-03-27 DIAGNOSIS — S32509D Unspecified fracture of unspecified pubis, subsequent encounter for fracture with routine healing: Secondary | ICD-10-CM | POA: Diagnosis not present

## 2018-03-27 DIAGNOSIS — S32599D Other specified fracture of unspecified pubis, subsequent encounter for fracture with routine healing: Secondary | ICD-10-CM | POA: Diagnosis not present

## 2018-03-27 DIAGNOSIS — X58XXXA Exposure to other specified factors, initial encounter: Secondary | ICD-10-CM | POA: Diagnosis present

## 2018-03-27 DIAGNOSIS — S32511A Fracture of superior rim of right pubis, initial encounter for closed fracture: Secondary | ICD-10-CM | POA: Diagnosis not present

## 2018-03-27 DIAGNOSIS — M545 Low back pain: Secondary | ICD-10-CM | POA: Diagnosis present

## 2018-03-27 DIAGNOSIS — R0902 Hypoxemia: Secondary | ICD-10-CM | POA: Diagnosis not present

## 2018-03-27 DIAGNOSIS — H9190 Unspecified hearing loss, unspecified ear: Secondary | ICD-10-CM | POA: Diagnosis not present

## 2018-03-27 DIAGNOSIS — R5381 Other malaise: Secondary | ICD-10-CM | POA: Diagnosis not present

## 2018-03-27 DIAGNOSIS — R350 Frequency of micturition: Secondary | ICD-10-CM | POA: Diagnosis not present

## 2018-03-27 DIAGNOSIS — Z8249 Family history of ischemic heart disease and other diseases of the circulatory system: Secondary | ICD-10-CM | POA: Diagnosis not present

## 2018-03-27 DIAGNOSIS — S32591A Other specified fracture of right pubis, initial encounter for closed fracture: Secondary | ICD-10-CM | POA: Diagnosis not present

## 2018-03-27 DIAGNOSIS — M1990 Unspecified osteoarthritis, unspecified site: Secondary | ICD-10-CM | POA: Diagnosis not present

## 2018-03-27 DIAGNOSIS — K769 Liver disease, unspecified: Secondary | ICD-10-CM | POA: Diagnosis not present

## 2018-03-27 DIAGNOSIS — F17210 Nicotine dependence, cigarettes, uncomplicated: Secondary | ICD-10-CM | POA: Diagnosis not present

## 2018-03-27 DIAGNOSIS — R54 Age-related physical debility: Secondary | ICD-10-CM | POA: Diagnosis not present

## 2018-03-27 DIAGNOSIS — D649 Anemia, unspecified: Secondary | ICD-10-CM | POA: Diagnosis present

## 2018-03-27 DIAGNOSIS — R531 Weakness: Secondary | ICD-10-CM | POA: Diagnosis not present

## 2018-03-27 DIAGNOSIS — E8770 Fluid overload, unspecified: Secondary | ICD-10-CM | POA: Diagnosis present

## 2018-03-27 DIAGNOSIS — Z23 Encounter for immunization: Secondary | ICD-10-CM | POA: Diagnosis not present

## 2018-03-27 DIAGNOSIS — R410 Disorientation, unspecified: Secondary | ICD-10-CM | POA: Diagnosis not present

## 2018-03-27 DIAGNOSIS — W19XXXA Unspecified fall, initial encounter: Secondary | ICD-10-CM | POA: Diagnosis not present

## 2018-03-27 DIAGNOSIS — G8929 Other chronic pain: Secondary | ICD-10-CM | POA: Diagnosis not present

## 2018-03-27 DIAGNOSIS — N3080 Other cystitis without hematuria: Secondary | ICD-10-CM | POA: Diagnosis not present

## 2018-03-27 DIAGNOSIS — S32591D Other specified fracture of right pubis, subsequent encounter for fracture with routine healing: Secondary | ICD-10-CM | POA: Diagnosis not present

## 2018-03-27 DIAGNOSIS — N3000 Acute cystitis without hematuria: Secondary | ICD-10-CM | POA: Diagnosis not present

## 2018-03-27 DIAGNOSIS — E039 Hypothyroidism, unspecified: Secondary | ICD-10-CM | POA: Diagnosis not present

## 2018-03-27 DIAGNOSIS — W19XXXS Unspecified fall, sequela: Secondary | ICD-10-CM | POA: Diagnosis not present

## 2018-03-27 DIAGNOSIS — S32501D Unspecified fracture of right pubis, subsequent encounter for fracture with routine healing: Secondary | ICD-10-CM | POA: Diagnosis not present

## 2018-03-27 DIAGNOSIS — I10 Essential (primary) hypertension: Secondary | ICD-10-CM | POA: Diagnosis not present

## 2018-03-27 DIAGNOSIS — F17200 Nicotine dependence, unspecified, uncomplicated: Secondary | ICD-10-CM | POA: Diagnosis not present

## 2018-03-27 DIAGNOSIS — N390 Urinary tract infection, site not specified: Secondary | ICD-10-CM | POA: Diagnosis not present

## 2018-03-27 DIAGNOSIS — E782 Mixed hyperlipidemia: Secondary | ICD-10-CM | POA: Diagnosis not present

## 2018-03-27 DIAGNOSIS — I868 Varicose veins of other specified sites: Secondary | ICD-10-CM | POA: Diagnosis not present

## 2018-03-27 DIAGNOSIS — Z7401 Bed confinement status: Secondary | ICD-10-CM | POA: Diagnosis not present

## 2018-03-27 DIAGNOSIS — M2550 Pain in unspecified joint: Secondary | ICD-10-CM | POA: Diagnosis not present

## 2018-03-27 DIAGNOSIS — Z1612 Extended spectrum beta lactamase (ESBL) resistance: Secondary | ICD-10-CM | POA: Diagnosis not present

## 2018-03-27 DIAGNOSIS — K5900 Constipation, unspecified: Secondary | ICD-10-CM | POA: Diagnosis not present

## 2018-03-27 DIAGNOSIS — R2689 Other abnormalities of gait and mobility: Secondary | ICD-10-CM | POA: Diagnosis not present

## 2018-03-27 DIAGNOSIS — T40605A Adverse effect of unspecified narcotics, initial encounter: Secondary | ICD-10-CM | POA: Diagnosis present

## 2018-03-27 DIAGNOSIS — F329 Major depressive disorder, single episode, unspecified: Secondary | ICD-10-CM | POA: Diagnosis present

## 2018-03-27 DIAGNOSIS — S3289XD Fracture of other parts of pelvis, subsequent encounter for fracture with routine healing: Secondary | ICD-10-CM | POA: Diagnosis not present

## 2018-03-27 DIAGNOSIS — M6281 Muscle weakness (generalized): Secondary | ICD-10-CM | POA: Diagnosis not present

## 2018-03-27 DIAGNOSIS — J449 Chronic obstructive pulmonary disease, unspecified: Secondary | ICD-10-CM | POA: Diagnosis not present

## 2018-03-27 DIAGNOSIS — E7800 Pure hypercholesterolemia, unspecified: Secondary | ICD-10-CM | POA: Diagnosis not present

## 2018-03-27 DIAGNOSIS — B965 Pseudomonas (aeruginosa) (mallei) (pseudomallei) as the cause of diseases classified elsewhere: Secondary | ICD-10-CM | POA: Diagnosis not present

## 2018-03-27 DIAGNOSIS — M169 Osteoarthritis of hip, unspecified: Secondary | ICD-10-CM | POA: Diagnosis not present

## 2018-03-27 DIAGNOSIS — B9620 Unspecified Escherichia coli [E. coli] as the cause of diseases classified elsewhere: Secondary | ICD-10-CM | POA: Diagnosis not present

## 2018-03-27 MED ORDER — HYDROCODONE-ACETAMINOPHEN 5-325 MG PO TABS
1.0000 | ORAL_TABLET | ORAL | Status: DC | PRN
  Filled 2018-03-27: qty 2

## 2018-03-27 NOTE — Progress Notes (Signed)
PROGRESS NOTE  Brief Narrative: [name] [name] a77 y.o.femalewith a history of COPD, ongoing tobacco use, hypothyroidism with incomplete adherence to synthroid, and recent pubic ramus fracture. She had been taking narcotic pain medications for the fracture but still had significant pain and additionally become severely constipated from the opioids though this has resolved with discontinuation of opioids and bowel regimen per PCP. Unfortunately she's had severe low back pain and ambulating less despite advil and tylenol at home and injection by sports medicine. She's been getting around with increasing difficulty, initially using a walker, now often uses a wheelchair and has needed EMS to come to the home to get her up as her 45 year old husband was unable to. On evaluation with her PCP she endorsed decreased per oral intake for which labs were checked. When Na came back at 118 she was contacted and referred to the ED where Na confirmed to be low at 121 and patient admitted.Fluid restriction was imposed, synthroid restarted, and isotonic saline provided with improvement in hyponatremia to what is likely her baseline at 151mmol/L. Constipation has been treated aggressively and is resolved. PT has recommended ongoing therapy at SNF.  Subjective: Did not follow fluid restriction very closely overnight, Na down a point to 132. Did not have BM yesterday though pain is well controlled.  Objective: BP (!) 177/77 (BP Location: Right Arm) Comment: notified RN  Pulse 70   Temp 98 F (36.7 C) (Oral)   Resp 16   Ht 5\' 7"  (1.702 m)   Wt 66.5 kg   SpO2 96%   BMI 22.96 kg/m   Gen: Elderly, no distress Pulm: Clear and nonlabored on room air  CV: RRR, no murmur, no JVD, no edema GI: Soft, NT, ND, +BS  Neuro: Alert and oriented. No focal deficits. Skin: No rashes, lesions or ulcers  Assessment & Plan: Changing diet to regular (not heart healthy) though still with fluid restriction that was reviewed  in detail with the patient, husband, and son at the bedside. I feel her sodium is stable enough for discharge. Her pain is well controlled on a stable regimen and she is tolerating an aggressive bowel program.  - Continues to be stable for discharge pending BCBS authorization.  [name] Pour, MD 03/27/2018, 1:06 PM

## 2018-03-27 NOTE — Clinical Social Work Placement (Signed)
Pt received approved [employer] authorization to admit to [facility]- report [phone]. [facility] transporting. Husband at bedside agreeable. DC information sent via the [place]  NOTE  Date:  03/27/2018  Patient Details  Name: [name] [name] MRN: [id number] Date of Birth: 09/11/1940  Clinical Social Work is seeking post-discharge placement for this patient at the [place] level of care (*CSW will initial, date and re-position this form in  chart as items are completed):  Yes   Patient/family provided with [place] Work Department's list of facilities offering this level of care within the geographic area requested by the patient (or if unable, by the patient's family).  Yes   Patient/family informed of their freedom to choose among providers that offer the needed level of care, that participate in Medicare, Medicaid or managed care program needed by the patient, have an available bed and are willing to accept the patient.  Yes   Patient/family informed of [place]'s ownership interest in [facility] and [facility], as well as of the fact that they are under no obligation to receive care at these facilities.  PASRR submitted to EDS on 03/26/18     PASRR number received on 03/26/18     Existing PASRR number confirmed on       FL2 transmitted to all facilities in geographic area requested by pt/family on 03/26/18     FL2 transmitted to all facilities within larger geographic area on       Patient informed that his/her managed care company has contracts with or will negotiate with certain facilities, including the following:        Yes   Patient/family informed of bed offers received.  Patient chooses bed at [facility]     Physician recommends and patient chooses bed at [facility]    Patient to be transferred to [facility] on 03/27/18.  Patient to be transferred to  facility by [facility]     Patient family notified on 03/27/18 of transfer.  Name of family member notified:  husband Mel     PHYSICIAN       Additional Comment:    _______________________________________________ Nila Nephew, LCSW 03/27/2018, 2:34 PM [phone]

## 2018-03-28 DIAGNOSIS — G8929 Other chronic pain: Secondary | ICD-10-CM | POA: Diagnosis not present

## 2018-03-28 DIAGNOSIS — E871 Hypo-osmolality and hyponatremia: Secondary | ICD-10-CM | POA: Diagnosis not present

## 2018-03-28 DIAGNOSIS — S3289XD Fracture of other parts of pelvis, subsequent encounter for fracture with routine healing: Secondary | ICD-10-CM | POA: Diagnosis not present

## 2018-03-28 DIAGNOSIS — R350 Frequency of micturition: Secondary | ICD-10-CM | POA: Diagnosis not present

## 2018-03-29 DIAGNOSIS — E782 Mixed hyperlipidemia: Secondary | ICD-10-CM | POA: Diagnosis not present

## 2018-03-29 DIAGNOSIS — J449 Chronic obstructive pulmonary disease, unspecified: Secondary | ICD-10-CM | POA: Diagnosis not present

## 2018-03-29 DIAGNOSIS — E039 Hypothyroidism, unspecified: Secondary | ICD-10-CM | POA: Diagnosis not present

## 2018-03-29 DIAGNOSIS — S32591D Other specified fracture of right pubis, subsequent encounter for fracture with routine healing: Secondary | ICD-10-CM | POA: Diagnosis not present

## 2018-04-01 ENCOUNTER — Inpatient Hospital Stay (HOSPITAL_COMMUNITY)
Admission: EM | Admit: 2018-04-01 | Discharge: 2018-04-08 | DRG: 641 | Disposition: A | Discharge status: Home Health | Payer: Medicare Other | Attending: Internal Medicine | Admitting: Internal Medicine

## 2018-04-01 ENCOUNTER — Telehealth: Payer: Self-pay | Admitting: Family Medicine

## 2018-04-01 ENCOUNTER — Other Ambulatory Visit: Payer: Self-pay

## 2018-04-01 ENCOUNTER — Observation Stay (HOSPITAL_COMMUNITY): Payer: Medicare Other

## 2018-04-01 ENCOUNTER — Encounter (HOSPITAL_COMMUNITY): Payer: Self-pay

## 2018-04-01 DIAGNOSIS — R0902 Hypoxemia: Secondary | ICD-10-CM | POA: Diagnosis not present

## 2018-04-01 DIAGNOSIS — S32511A Fracture of superior rim of right pubis, initial encounter for closed fracture: Secondary | ICD-10-CM | POA: Diagnosis not present

## 2018-04-01 DIAGNOSIS — K769 Liver disease, unspecified: Secondary | ICD-10-CM | POA: Diagnosis not present

## 2018-04-01 DIAGNOSIS — N3000 Acute cystitis without hematuria: Secondary | ICD-10-CM

## 2018-04-01 DIAGNOSIS — E7800 Pure hypercholesterolemia, unspecified: Secondary | ICD-10-CM | POA: Diagnosis present

## 2018-04-01 DIAGNOSIS — I10 Essential (primary) hypertension: Secondary | ICD-10-CM | POA: Diagnosis present

## 2018-04-01 DIAGNOSIS — S3210XD Unspecified fracture of sacrum, subsequent encounter for fracture with routine healing: Secondary | ICD-10-CM | POA: Diagnosis not present

## 2018-04-01 DIAGNOSIS — F17210 Nicotine dependence, cigarettes, uncomplicated: Secondary | ICD-10-CM | POA: Diagnosis present

## 2018-04-01 DIAGNOSIS — H9190 Unspecified hearing loss, unspecified ear: Secondary | ICD-10-CM | POA: Diagnosis present

## 2018-04-01 DIAGNOSIS — R410 Disorientation, unspecified: Secondary | ICD-10-CM | POA: Diagnosis not present

## 2018-04-01 DIAGNOSIS — Z23 Encounter for immunization: Secondary | ICD-10-CM

## 2018-04-01 DIAGNOSIS — Z452 Encounter for adjustment and management of vascular access device: Secondary | ICD-10-CM | POA: Diagnosis not present

## 2018-04-01 DIAGNOSIS — M545 Low back pain: Secondary | ICD-10-CM | POA: Diagnosis present

## 2018-04-01 DIAGNOSIS — S32599D Other specified fracture of unspecified pubis, subsequent encounter for fracture with routine healing: Secondary | ICD-10-CM | POA: Diagnosis not present

## 2018-04-01 DIAGNOSIS — Z8249 Family history of ischemic heart disease and other diseases of the circulatory system: Secondary | ICD-10-CM

## 2018-04-01 DIAGNOSIS — N3080 Other cystitis without hematuria: Secondary | ICD-10-CM

## 2018-04-01 DIAGNOSIS — F329 Major depressive disorder, single episode, unspecified: Secondary | ICD-10-CM | POA: Diagnosis present

## 2018-04-01 DIAGNOSIS — S3289XD Fracture of other parts of pelvis, subsequent encounter for fracture with routine healing: Secondary | ICD-10-CM | POA: Diagnosis not present

## 2018-04-01 DIAGNOSIS — E871 Hypo-osmolality and hyponatremia: Secondary | ICD-10-CM | POA: Diagnosis not present

## 2018-04-01 DIAGNOSIS — Z79890 Hormone replacement therapy (postmenopausal): Secondary | ICD-10-CM

## 2018-04-01 DIAGNOSIS — K5790 Diverticulosis of intestine, part unspecified, without perforation or abscess without bleeding: Secondary | ICD-10-CM | POA: Diagnosis not present

## 2018-04-01 DIAGNOSIS — S32591A Other specified fracture of right pubis, initial encounter for closed fracture: Secondary | ICD-10-CM | POA: Diagnosis present

## 2018-04-01 DIAGNOSIS — R531 Weakness: Secondary | ICD-10-CM | POA: Diagnosis not present

## 2018-04-01 DIAGNOSIS — B965 Pseudomonas (aeruginosa) (mallei) (pseudomallei) as the cause of diseases classified elsewhere: Secondary | ICD-10-CM | POA: Diagnosis not present

## 2018-04-01 DIAGNOSIS — M169 Osteoarthritis of hip, unspecified: Secondary | ICD-10-CM | POA: Diagnosis not present

## 2018-04-01 DIAGNOSIS — S32599A Other specified fracture of unspecified pubis, initial encounter for closed fracture: Secondary | ICD-10-CM | POA: Diagnosis present

## 2018-04-01 DIAGNOSIS — K5900 Constipation, unspecified: Secondary | ICD-10-CM | POA: Diagnosis not present

## 2018-04-01 DIAGNOSIS — X58XXXA Exposure to other specified factors, initial encounter: Secondary | ICD-10-CM | POA: Diagnosis present

## 2018-04-01 DIAGNOSIS — T40605A Adverse effect of unspecified narcotics, initial encounter: Secondary | ICD-10-CM | POA: Diagnosis present

## 2018-04-01 DIAGNOSIS — F17200 Nicotine dependence, unspecified, uncomplicated: Secondary | ICD-10-CM

## 2018-04-01 DIAGNOSIS — E8770 Fluid overload, unspecified: Secondary | ICD-10-CM | POA: Diagnosis present

## 2018-04-01 DIAGNOSIS — D649 Anemia, unspecified: Secondary | ICD-10-CM | POA: Diagnosis present

## 2018-04-01 DIAGNOSIS — R7881 Bacteremia: Secondary | ICD-10-CM | POA: Diagnosis not present

## 2018-04-01 DIAGNOSIS — J449 Chronic obstructive pulmonary disease, unspecified: Secondary | ICD-10-CM | POA: Diagnosis present

## 2018-04-01 DIAGNOSIS — N390 Urinary tract infection, site not specified: Secondary | ICD-10-CM | POA: Diagnosis present

## 2018-04-01 DIAGNOSIS — G8929 Other chronic pain: Secondary | ICD-10-CM | POA: Diagnosis present

## 2018-04-01 DIAGNOSIS — S32058D Other fracture of fifth lumbar vertebra, subsequent encounter for fracture with routine healing: Secondary | ICD-10-CM | POA: Diagnosis not present

## 2018-04-01 DIAGNOSIS — R030 Elevated blood-pressure reading, without diagnosis of hypertension: Secondary | ICD-10-CM | POA: Diagnosis not present

## 2018-04-01 DIAGNOSIS — Z1612 Extended spectrum beta lactamase (ESBL) resistance: Secondary | ICD-10-CM | POA: Diagnosis present

## 2018-04-01 DIAGNOSIS — E039 Hypothyroidism, unspecified: Secondary | ICD-10-CM | POA: Diagnosis not present

## 2018-04-01 DIAGNOSIS — B9620 Unspecified Escherichia coli [E. coli] as the cause of diseases classified elsewhere: Secondary | ICD-10-CM | POA: Diagnosis present

## 2018-04-01 DIAGNOSIS — Z792 Long term (current) use of antibiotics: Secondary | ICD-10-CM | POA: Diagnosis not present

## 2018-04-01 LAB — COMPREHENSIVE METABOLIC PANEL
ALBUMIN: 3.3 g/dL — AB (ref 3.5–5.0)
ALK PHOS: 193 U/L — AB (ref 38–126)
ALT: 16 U/L (ref 0–44)
ANION GAP: 9 (ref 5–15)
AST: 28 U/L (ref 15–41)
BILIRUBIN TOTAL: 0.4 mg/dL (ref 0.3–1.2)
BUN: 16 mg/dL (ref 8–23)
CALCIUM: 9.5 mg/dL (ref 8.9–10.3)
CO2: 24 mmol/L (ref 22–32)
Chloride: 93 mmol/L — ABNORMAL LOW (ref 98–111)
Creatinine, Ser: 0.82 mg/dL (ref 0.44–1.00)
GFR calc Af Amer: >60 mL/min (ref >60)
GFR calc non Af Amer: >60 mL/min (ref >60)
Glucose, Bld: 108 mg/dL — ABNORMAL HIGH (ref 70–99)
Potassium: 4.1 mmol/L (ref 3.5–5.1)
Sodium: 126 mmol/L — ABNORMAL LOW (ref 135–145)
TOTAL PROTEIN: 6.5 g/dL (ref 6.5–8.1)

## 2018-04-01 LAB — URINALYSIS, ROUTINE W REFLEX MICROSCOPIC
Bilirubin Urine: NEGATIVE
Glucose, UA: NEGATIVE mg/dL
KETONES UR: NEGATIVE mg/dL
Nitrite: POSITIVE — AB
PH: 6 (ref 5.0–8.0)
Protein, ur: NEGATIVE mg/dL
SPECIFIC GRAVITY, URINE: 1.006 (ref 1.005–1.030)
WBC, UA: >50 WBC/hpf — ABNORMAL HIGH (ref 0–5)

## 2018-04-01 LAB — SODIUM, URINE, RANDOM: SODIUM UR: 47 mmol/L

## 2018-04-01 LAB — CBC WITH DIFFERENTIAL/PLATELET
Abs Immature Granulocytes: 0.04 10*3/uL (ref 0.00–0.07)
BASOS PCT: 1 %
Basophils Absolute: 0.1 10*3/uL (ref 0.0–0.1)
EOS ABS: 0.1 10*3/uL (ref 0.0–0.5)
EOS PCT: 1 %
HCT: 37.3 % (ref 36.0–46.0)
Hemoglobin: 12.5 g/dL (ref 12.0–15.0)
Immature Granulocytes: 0 %
Lymphocytes Relative: 12 %
Lymphs Abs: 1.1 10*3/uL (ref 0.7–4.0)
MCH: 28.5 pg (ref 26.0–34.0)
MCHC: 33.5 g/dL (ref 30.0–36.0)
MCV: 85.2 fL (ref 80.0–100.0)
MONO ABS: 0.9 10*3/uL (ref 0.1–1.0)
MONOS PCT: 10 %
Neutro Abs: 7.4 10*3/uL (ref 1.7–7.7)
Neutrophils Relative %: 76 %
PLATELETS: 413 10*3/uL — AB (ref 150–400)
RBC: 4.38 MIL/uL (ref 3.87–5.11)
RDW: 12.9 % (ref 11.5–15.5)
WBC: 9.6 10*3/uL (ref 4.0–10.5)
nRBC: 0 % (ref 0.0–0.2)

## 2018-04-01 MED ORDER — ENOXAPARIN SODIUM 40 MG/0.4ML ~~LOC~~ SOLN
40.0000 mg | SUBCUTANEOUS | Status: DC
  Administered 2018-04-01 – 2018-04-07 (×7): 40 mg via SUBCUTANEOUS
  Filled 2018-04-01 (×7): qty 0.4

## 2018-04-01 MED ORDER — POLYETHYLENE GLYCOL 3350 17 G PO PACK
17.0000 g | PACK | Freq: Every day | ORAL | Status: DC
  Administered 2018-04-02 – 2018-04-04 (×3): 17 g via ORAL
  Filled 2018-04-01 (×5): qty 1

## 2018-04-01 MED ORDER — NICOTINE POLACRILEX 2 MG MT GUM
2.0000 mg | CHEWING_GUM | OROMUCOSAL | Status: DC | PRN
  Filled 2018-04-01: qty 1

## 2018-04-01 MED ORDER — SENNA 8.6 MG PO TABS
1.0000 | ORAL_TABLET | Freq: Two times a day (BID) | ORAL | Status: DC
  Administered 2018-04-01 – 2018-04-08 (×13): 8.6 mg via ORAL
  Filled 2018-04-01 (×14): qty 1

## 2018-04-01 MED ORDER — ONDANSETRON HCL 4 MG/2ML IJ SOLN: 4.0000 mg | Freq: Four times a day (QID) | INTRAMUSCULAR | Status: DC | PRN

## 2018-04-01 MED ORDER — PANTOPRAZOLE SODIUM 40 MG PO TBEC
40.0000 mg | DELAYED_RELEASE_TABLET | Freq: Every day | ORAL | Status: DC
  Administered 2018-04-02 – 2018-04-08 (×7): 40 mg via ORAL
  Filled 2018-04-01 (×7): qty 1

## 2018-04-01 MED ORDER — SODIUM CHLORIDE 0.9 % IV BOLUS
500.0000 mL | Freq: Once | INTRAVENOUS | Status: AC
  Administered 2018-04-01: 500 mL via INTRAVENOUS

## 2018-04-01 MED ORDER — SODIUM CHLORIDE 0.9 % IV SOLN: Freq: Once | INTRAVENOUS | Status: DC

## 2018-04-01 MED ORDER — ONDANSETRON HCL 4 MG PO TABS: 4.0000 mg | ORAL_TABLET | Freq: Four times a day (QID) | ORAL | Status: DC | PRN

## 2018-04-01 MED ORDER — HYDROCODONE-ACETAMINOPHEN 5-325 MG PO TABS
1.0000 | ORAL_TABLET | Freq: Once | ORAL | Status: AC
  Administered 2018-04-01: 1 via ORAL
  Filled 2018-04-01: qty 1

## 2018-04-01 MED ORDER — INFLUENZA VAC SPLIT HIGH-DOSE 0.5 ML IM SUSY
0.5000 mL | PREFILLED_SYRINGE | INTRAMUSCULAR | Status: AC
  Administered 2018-04-03: 0.5 mL via INTRAMUSCULAR
  Filled 2018-04-01: qty 0.5

## 2018-04-01 MED ORDER — LEVOTHYROXINE SODIUM 75 MCG PO TABS
75.0000 ug | ORAL_TABLET | Freq: Every day | ORAL | Status: DC
  Administered 2018-04-02 – 2018-04-08 (×7): 75 ug via ORAL
  Filled 2018-04-01 (×7): qty 1

## 2018-04-01 MED ORDER — SODIUM CHLORIDE 0.9 % IV SOLN
1.0000 g | Freq: Once | INTRAVENOUS | Status: AC
  Administered 2018-04-01: 1 g via INTRAVENOUS
  Filled 2018-04-01: qty 10

## 2018-04-01 MED ORDER — HYDROCODONE-ACETAMINOPHEN 5-325 MG PO TABS
1.0000 | ORAL_TABLET | Freq: Four times a day (QID) | ORAL | Status: DC | PRN
  Administered 2018-04-01 – 2018-04-08 (×19): 2 via ORAL
  Filled 2018-04-01 (×19): qty 2
  Filled 2018-04-01: qty 1

## 2018-04-01 MED ORDER — ACETAMINOPHEN 325 MG PO TABS: 650.0000 mg | ORAL_TABLET | Freq: Four times a day (QID) | ORAL | Status: DC | PRN

## 2018-04-01 MED ORDER — FLEET ENEMA 7-19 GM/118ML RE ENEM: 1.0000 | ENEMA | Freq: Every day | RECTAL | Status: DC | PRN

## 2018-04-01 MED ORDER — HYDROXYZINE HCL 25 MG PO TABS
25.0000 mg | ORAL_TABLET | Freq: Three times a day (TID) | ORAL | Status: DC | PRN
  Administered 2018-04-01 – 2018-04-04 (×2): 25 mg via ORAL
  Filled 2018-04-01 (×2): qty 1

## 2018-04-01 MED ORDER — SODIUM CHLORIDE 0.9 % IV SOLN
INTRAVENOUS | Status: DC
  Administered 2018-04-01 – 2018-04-03 (×5): via INTRAVENOUS

## 2018-04-01 MED ORDER — SODIUM CHLORIDE 0.9 % IV SOLN
1.0000 g | INTRAVENOUS | Status: DC
  Administered 2018-04-02: 1 g via INTRAVENOUS
  Filled 2018-04-01: qty 1

## 2018-04-01 MED ORDER — ACETAMINOPHEN 650 MG RE SUPP: 650.0000 mg | Freq: Four times a day (QID) | RECTAL | Status: DC | PRN

## 2018-04-01 NOTE — ED Triage Notes (Signed)
Per EMS-states patient was here recently for the same symptoms-was transferred to [facility] they are not doing anything for her-husband was there gathering up her belongings-stated she is not to return there-no complaints other than constipation from pain meds-ambulatory on scene-fell asleep during transport

## 2018-04-01 NOTE — ED Notes (Signed)
ED TO INPATIENT HANDOFF REPORT  Name/Age/Gender [name] [name] 77 y.o. female  Code Status    Code Status Orders  (From admission, onward)         Start     Ordered   04/01/18 2051  Full code  Continuous     04/01/18 2052        Code Status History    Date Active Date Inactive Code Status Order ID Comments User Context   03/23/2018 1030 03/27/2018 1901 Full Code [id number]  Doutova, [name], MD Inpatient    Advance Directive Documentation     Most Recent Value  Type of Advance Directive  Living will  Pre-existing out of facility DNR order (yellow form or pink MOST form)  -  "MOST" Form in Place?  -      Home/SNF/Other Home  Chief Complaint low sodium   Level of Care/Admitting Diagnosis ED Disposition    ED Disposition Condition Comment   Admit  Hospital Area: [facility] [[id number]]  Level of Care: Med-Surg [16]  Diagnosis: Hyponatremia [198519]  Admitting Physician: GARDNER, JARED M [[id number]]  Attending Physician: GARDNER, JARED M [[id number]]  PT Class (Do Not Modify): Observation [104]  PT Acc Code (Do Not Modify): Observation [10022]       Medical History Past Medical History:  Diagnosis Date  . Asymptomatic varicose veins   . Cataract    beginnings  . Cigarette smoker   . COPD (chronic obstructive pulmonary disease) (HCC)   . Diverticulosis of colon (without mention of hemorrhage)   . Headache(784.0)   . Hearing loss    wears hearing aides  . Lumbar back pain   . Pure hypercholesterolemia   . Stress disorder, acute   . Varicose veins     Allergies Allergies  Allergen Reactions  . Atorvastatin     Intolerant to all statins   . Cyclobenzaprine Other (See Comments)    Bradycardia, hypotension  . Ezetimibe      INTOL to Zetia  . Rosuvastatin     intolerant to all statins  . Gadolinium Derivatives Nausea Only    Pt became very nauseous after gadolinium administration//lh  . Nicoderm [Nicotine] Rash    Unable to use the  patches---caused severe rash to area placed    IV Location/Drains/Wounds Patient Lines/Drains/Airways Status   Active Line/Drains/Airways    Name:   Placement date:   Placement time:   Site:   Days:   Peripheral IV 03/25/18 Left;Anterior Forearm   03/25/18    0701    Forearm   7   Peripheral IV 04/01/18 Right Antecubital   04/01/18    1646    Antecubital   less than 1   External Urinary Catheter   04/01/18    1639    -   less than 1          Labs/Imaging Results for orders placed or performed during the hospital encounter of 04/01/18 (from the past 48 hour(s))  Comprehensive metabolic panel     Status: Abnormal   Collection Time: 04/01/18  4:29 PM  Result Value Ref Range   Sodium 126 (L) 135 - 145 mmol/L   Potassium 4.1 3.5 - 5.1 mmol/L   Chloride 93 (L) 98 - 111 mmol/L   CO2 24 22 - 32 mmol/L   Glucose, Bld 108 (H) 70 - 99 mg/dL   BUN 16 8 - 23 mg/dL   Creatinine, Ser 0.82 0.44 - 1.00 mg/dL   Calcium 9.5   8.9 - 10.3 mg/dL   Total Protein 6.5 6.5 - 8.1 g/dL   Albumin 3.3 (L) 3.5 - 5.0 g/dL   AST 28 15 - 41 U/L   ALT 16 0 - 44 U/L   Alkaline Phosphatase 193 (H) 38 - 126 U/L   Total Bilirubin 0.4 0.3 - 1.2 mg/dL   GFR calc non Af Amer >60 >60 mL/min   GFR calc Af Amer >60 >60 mL/min    Comment: (NOTE) The eGFR has been calculated using the CKD EPI equation. This calculation has not been validated in all clinical situations. eGFR's persistently <60 mL/min signify possible Chronic Kidney Disease.    Anion gap 9 5 - 15    Comment: Performed at [facility], [place] [address] [place], [place] [address]  CBC with Differential     Status: Abnormal   Collection Time: 04/01/18  4:29 PM  Result Value Ref Range   WBC 9.6 4.0 - 10.5 K/uL   RBC 4.38 3.87 - 5.11 MIL/uL   Hemoglobin 12.5 12.0 - 15.0 g/dL   HCT 37.3 36.0 - 46.0 %   MCV 85.2 80.0 - 100.0 fL   MCH 28.5 26.0 - 34.0 pg   MCHC 33.5 30.0 - 36.0 g/dL   RDW 12.9 11.5 - 15.5 %   Platelets 413 (H) 150 -  400 K/uL   nRBC 0.0 0.0 - 0.2 %   Neutrophils Relative % 76 %   Neutro Abs 7.4 1.7 - 7.7 K/uL   Lymphocytes Relative 12 %   Lymphs Abs 1.1 0.7 - 4.0 K/uL   Monocytes Relative 10 %   Monocytes Absolute 0.9 0.1 - 1.0 K/uL   Eosinophils Relative 1 %   Eosinophils Absolute 0.1 0.0 - 0.5 K/uL   Basophils Relative 1 %   Basophils Absolute 0.1 0.0 - 0.1 K/uL   Immature Granulocytes 0 %   Abs Immature Granulocytes 0.04 0.00 - 0.07 K/uL    Comment: Performed at [facility], [place] [address], [place], [place] [address]  Urinalysis, Routine w reflex microscopic     Status: Abnormal   Collection Time: 04/01/18  7:17 PM  Result Value Ref Range   Color, Urine YELLOW YELLOW   APPearance HAZY (A) CLEAR   Specific Gravity, Urine 1.006 1.005 - 1.030   pH 6.0 5.0 - 8.0   Glucose, UA NEGATIVE NEGATIVE mg/dL   Hgb urine dipstick MODERATE (A) NEGATIVE   Bilirubin Urine NEGATIVE NEGATIVE   Ketones, ur NEGATIVE NEGATIVE mg/dL   Protein, ur NEGATIVE NEGATIVE mg/dL   Nitrite POSITIVE (A) NEGATIVE   Leukocytes, UA LARGE (A) NEGATIVE   RBC / HPF 11-20 0 - 5 RBC/hpf   WBC, UA >50 (H) 0 - 5 WBC/hpf   Bacteria, UA FEW (A) NONE SEEN   Squamous Epithelial / LPF 0-5 0 - 5   Mucus PRESENT     Comment: Performed at [facility], [place] [address] [place], [place] [address]  Sodium, urine, random     Status: None   Collection Time: 04/01/18  7:17 PM  Result Value Ref Range   Sodium, Ur 47 mmol/L    Comment: Performed at [facility], [place] [address], [place], [place] [address]   Ct Abdomen Pelvis Wo Contrast  Result Date: 04/01/2018 CLINICAL DATA:  Back pain possibly from a pubic ramus fracture. Urinary tract infection. No bowel movement in 1 week. Dysphagia to solids for couple of months. Hyponatremia. Smoker. EXAM: CT ABDOMEN  AND PELVIS WITHOUT CONTRAST TECHNIQUE: Multidetector CT imaging of the abdomen and pelvis was performed following the standard  protocol without IV contrast. COMPARISON:  CT pelvis 02/27/2018 FINDINGS: Lower chest: Atelectasis and scarring in the lung bases. Mild bronchiectasis with some mucous plugging. Hepatobiliary: Evaluation of the liver is limited without IV contrast material but there are mildly hypodense poorly circumscribed lesions in the medial segment left lobe measuring 2.1 cm diameter and in the dome of the liver measuring 2.2 cm diameter. Density measurements are not consistent with a simple cyst. Consider possibility of metastasis or other solid lesion. Suggest initial characterization with contrast-enhanced CT or MRI. Gallbladder is distended without stone or inflammatory infiltration. No bile duct dilatation. Pancreas: Diffuse fatty infiltration of the pancreas. Spleen: Normal in size without focal abnormality. Adrenals/Urinary Tract: No adrenal gland nodules. Kidneys appear symmetrical. Mildly dilated right renal collecting system and right ureter with slight infiltration around the right kidney. No obstructing stone is identified. This could be sequela of recently passed stone or pyelonephritis. Small amount of gas in the bladder could arise from instrumentation or infection. No bladder wall thickening. Stomach/Bowel: Stomach and small bowel are decompressed. Diffusely stool-filled colon without distention. No wall thickening or inflammatory changes. Diverticulosis of the sigmoid colon without evidence of diverticulitis. Appendix is not identified. Vascular/Lymphatic: Aortic atherosclerosis. No enlarged abdominal or pelvic lymph nodes. Reproductive: Uterus and bilateral adnexa are unremarkable. Other: No abdominal wall hernia or abnormality. No abdominopelvic ascites. Musculoskeletal: Healing fractures of the right superior and inferior pubic rami and of the sacral ala bilaterally. Also fracture of the right transverse process of L5. Degenerative changes throughout the spine. No destructive bone lesions. IMPRESSION: 1.  Healing fractures of the right superior and inferior pubic rami and of the sacral ala bilaterally. Fracture of the right transverse process of L5. 2. Indeterminate hypodense lesions in the medial segment left lobe of the liver measuring 2.1 cm diameter and 2.2 cm diameter. Suggest initial characterization with contrast-enhanced CT or MRI. 3. Mildly dilated right renal collecting system and right ureter with mild infiltration around the right kidney. No obstructing stone is identified. This could be sequela of recently passed stone or pyelonephritis. Small amount of gas in the bladder could arise from instrumentation or infection. 4. Diverticulosis of the sigmoid colon without evidence of diverticulitis. Aortic Atherosclerosis (ICD10-I70.0). Electronically Signed   By: William  Stevens M.D.   On: 04/01/2018 21:25    Pending Labs Unresulted Labs (From admission, onward)    Start     Ordered   04/02/18 0500  Basic metabolic panel  Tomorrow morning,   R     04/01/18 2003   04/01/18 2103  Creatinine, urine, random  Add-on,   STAT     04/01/18 2102   04/01/18 2103  Culture, Urine  Add-on,   R     04/01/18 2102          Vitals/Pain Today's Vitals   04/01/18 2000 04/01/18 2021 04/01/18 2127 04/01/18 2135  BP: (!) 164/85     Pulse: 90     Resp: 16     Temp:      TempSrc:      SpO2: 95%     Weight:      Height:      PainSc:  8  5  5     Isolation Precautions No active isolations  Medications Medications  cefTRIAXone (ROCEPHIN) 1 g in sodium chloride 0.9 % 100 mL IVPB (has no administration in time range)    HYDROcodone-acetaminophen (NORCO/VICODIN) 5-325 MG per tablet 1-2 tablet (has no administration in time range)  hydrOXYzine (ATARAX/VISTARIL) tablet 25 mg (has no administration in time range)  levothyroxine (SYNTHROID, LEVOTHROID) tablet 75 mcg (has no administration in time range)  pantoprazole (PROTONIX) EC tablet 40 mg (has no administration in time range)  senna (SENOKOT) tablet  8.6 mg (has no administration in time range)  polyethylene glycol (MIRALAX / GLYCOLAX) packet 17 g (has no administration in time range)  nicotine polacrilex (NICORETTE) gum 2 mg (has no administration in time range)  0.9 %  sodium chloride infusion ( Intravenous New Bag/Given 04/01/18 2024)  acetaminophen (TYLENOL) tablet 650 mg (has no administration in time range)    Or  acetaminophen (TYLENOL) suppository 650 mg (has no administration in time range)  ondansetron (ZOFRAN) tablet 4 mg (has no administration in time range)    Or  ondansetron (ZOFRAN) injection 4 mg (has no administration in time range)  enoxaparin (LOVENOX) injection 40 mg (has no administration in time range)  sodium phosphate (FLEET) 7-19 GM/118ML enema 1 enema (has no administration in time range)  sodium chloride 0.9 % bolus 500 mL (0 mLs Intravenous Stopped 04/01/18 1846)  HYDROcodone-acetaminophen (NORCO/VICODIN) 5-325 MG per tablet 1 tablet (1 tablet Oral Given 04/01/18 2022)  cefTRIAXone (ROCEPHIN) 1 g in sodium chloride 0.9 % 100 mL IVPB (0 g Intravenous Stopped 04/01/18 2101)    Mobility walks  

## 2018-04-01 NOTE — ED Provider Notes (Signed)
Emergency Department Provider Note   I have reviewed the triage vital signs and the nursing notes.   HISTORY  Chief Complaint Abnormal Lab   HPI [name] [name] is a 76 y.o. female with PMH of COPD, chronic lower back pain, and hyponatremia returns to the emergency department for evaluation of low sodium.  She was discharged on 03/26/18 with hyponatremia.  She had labs drawn on Friday for follow-up and was called today with results saying that her with sodium was 123.  She reports a poor appetite.  She has not continued her fluid restriction diet as she says she was told that she did not need a fluid restriction at home.  No new medications. Son at bedside reports some confusion. She is currently at a rehab facility following her discharge on the 8th.    Past Medical History:  Diagnosis Date  . Asymptomatic varicose veins   . Cataract    beginnings  . Cigarette smoker   . COPD (chronic obstructive pulmonary disease) ([place])   . Diverticulosis of colon (without mention of hemorrhage)   . Headache(784.0)   . Hearing loss    wears hearing aides  . Lumbar back pain   . Pure hypercholesterolemia   . Stress disorder, acute   . Varicose veins     Patient Active Problem List   Diagnosis Date Noted  . Acute lower UTI 04/01/2018  . Constipation 04/01/2018  . Hyponatremia 03/23/2018  . Debility 03/23/2018  . Pubic ramus fracture ([place]) 03/23/2018  . Fall 03/23/2018  . Urinary retention 03/23/2018  . Primary osteoarthritis of right hip 02/07/2018  . Belching 02/01/2018  . Varicose veins 12/02/2013  . OA (osteoarthritis) 08/15/2012  . Hearing loss 08/16/2009  . Hypothyroidism 10/12/2007  . CIGARETTE SMOKER 10/09/2007  . HYPERCHOLESTEROLEMIA 10/08/2007  . COPD (chronic obstructive pulmonary disease) ([place]) 10/08/2007  . BACK PAIN, LUMBAR 10/08/2007    Past Surgical History:  Procedure Laterality Date  . COLONOSCOPY  04-22-2004  . LUMBAR SPINE SURGERY     [phone]  .  varicose vein treatment     at [facility]    Allergies Atorvastatin; Cyclobenzaprine; Ezetimibe; Rosuvastatin; Gadolinium derivatives; and Nicoderm [nicotine]  Family History  Problem Relation Age of Onset  . Diabetes Father   . Heart attack Mother 74       nonsmoker  . Colon cancer Neg Hx   . Rectal cancer Neg Hx   . Stomach cancer Neg Hx     Social History Social History   Tobacco Use  . Smoking status: Current Every Day Smoker    Packs/day: 0.50    Years: 40.00    Pack years: 20.00    Types: Cigarettes  . Smokeless tobacco: Never Used  . Tobacco comment: 1/2-3/4 of a pk a day   Substance Use Topics  . Alcohol use: No    Alcohol/week: 0.0 standard drinks  . Drug use: No    Review of Systems  Constitutional: No fever/chills. Positive generalized weakness and mild confusion (per family).  Eyes: No visual changes. ENT: No sore throat. Cardiovascular: Denies chest pain. Respiratory: Denies shortness of breath. Gastrointestinal: No abdominal pain.  No nausea, no vomiting.  No diarrhea.  No constipation. Genitourinary: Negative for dysuria. Musculoskeletal: Negative for back pain. Skin: Negative for rash. Neurological: Negative for headaches, focal weakness or numbness.  10-point ROS otherwise negative.  ____________________________________________   PHYSICAL EXAM:  VITAL SIGNS: ED Triage Vitals  Enc Vitals Group     BP  04/01/18 1438 (!) 152/70     Pulse Rate 04/01/18 1438 75     Resp 04/01/18 1438 16     Temp 04/01/18 1438 98.2 F (36.8 C)     Temp Source 04/01/18 1438 Oral     SpO2 04/01/18 1438 95 %     Weight 04/01/18 1506 146 lb 9.7 oz (66.5 kg)     Height 04/01/18 1506 5\' 7"  (1.702 m)   Constitutional: Alert and oriented. Well appearing and in no acute distress. Eyes: Conjunctivae are normal. PERRL. Head: Atraumatic. Nose: No congestion/rhinnorhea. Mouth/Throat: Mucous membranes are moist.  Oropharynx non-erythematous. Neck: No  stridor. Cardiovascular: Normal rate, regular rhythm. Good peripheral circulation. Grossly normal heart sounds.   Respiratory: Normal respiratory effort.  No retractions. Lungs CTAB. Gastrointestinal: Soft and nontender. No distention.  Musculoskeletal: No lower extremity tenderness nor edema. No gross deformities of extremities. Neurologic:  Normal speech and language. No gross focal neurologic deficits are appreciated.  Skin:  Skin is warm, dry and intact. No rash noted.  ____________________________________________   LABS (all labs ordered are listed, but only abnormal results are displayed)  Labs Reviewed  COMPREHENSIVE METABOLIC PANEL - Abnormal; Notable for the following components:      Result Value   Sodium 126 (*)    Chloride 93 (*)    Glucose, Bld 108 (*)    Albumin 3.3 (*)    Alkaline Phosphatase 193 (*)    All other components within normal limits  CBC WITH DIFFERENTIAL/PLATELET - Abnormal; Notable for the following components:   Platelets 413 (*)    All other components within normal limits  URINALYSIS, ROUTINE W REFLEX MICROSCOPIC - Abnormal; Notable for the following components:   APPearance HAZY (*)    Hgb urine dipstick MODERATE (*)    Nitrite POSITIVE (*)    Leukocytes, UA LARGE (*)    WBC, UA >50 (*)    Bacteria, UA FEW (*)    All other components within normal limits  URINE CULTURE  SODIUM, URINE, RANDOM  BASIC METABOLIC PANEL  CREATININE, URINE, RANDOM   ____________________________________________  RADIOLOGY  Ct Abdomen Pelvis Wo Contrast  Result Date: 04/01/2018 CLINICAL DATA:  Back pain possibly from a pubic ramus fracture. Urinary tract infection. No bowel movement in 1 week. Dysphagia to solids for couple of months. Hyponatremia. Smoker. EXAM: CT ABDOMEN AND PELVIS WITHOUT CONTRAST TECHNIQUE: Multidetector CT imaging of the abdomen and pelvis was performed following the standard protocol without IV contrast. COMPARISON:  CT pelvis 02/27/2018  FINDINGS: Lower chest: Atelectasis and scarring in the lung bases. Mild bronchiectasis with some mucous plugging. Hepatobiliary: Evaluation of the liver is limited without IV contrast material but there are mildly hypodense poorly circumscribed lesions in the medial segment left lobe measuring 2.1 cm diameter and in the dome of the liver measuring 2.2 cm diameter. Density measurements are not consistent with a simple cyst. Consider possibility of metastasis or other solid lesion. Suggest initial characterization with contrast-enhanced CT or MRI. Gallbladder is distended without stone or inflammatory infiltration. No bile duct dilatation. Pancreas: Diffuse fatty infiltration of the pancreas. Spleen: Normal in size without focal abnormality. Adrenals/Urinary Tract: No adrenal gland nodules. Kidneys appear symmetrical. Mildly dilated right renal collecting system and right ureter with slight infiltration around the right kidney. No obstructing stone is identified. This could be sequela of recently passed stone or pyelonephritis. Small amount of gas in the bladder could arise from instrumentation or infection. No bladder wall thickening. Stomach/Bowel: Stomach and small bowel are  decompressed. Diffusely stool-filled colon without distention. No wall thickening or inflammatory changes. Diverticulosis of the sigmoid colon without evidence of diverticulitis. Appendix is not identified. Vascular/Lymphatic: Aortic atherosclerosis. No enlarged abdominal or pelvic lymph nodes. Reproductive: Uterus and bilateral adnexa are unremarkable. Other: No abdominal wall hernia or abnormality. No abdominopelvic ascites. Musculoskeletal: Healing fractures of the right superior and inferior pubic rami and of the sacral ala bilaterally. Also fracture of the right transverse process of L5. Degenerative changes throughout the spine. No destructive bone lesions. IMPRESSION: 1. Healing fractures of the right superior and inferior pubic rami and  of the sacral ala bilaterally. Fracture of the right transverse process of L5. 2. Indeterminate hypodense lesions in the medial segment left lobe of the liver measuring 2.1 cm diameter and 2.2 cm diameter. Suggest initial characterization with contrast-enhanced CT or MRI. 3. Mildly dilated right renal collecting system and right ureter with mild infiltration around the right kidney. No obstructing stone is identified. This could be sequela of recently passed stone or pyelonephritis. Small amount of gas in the bladder could arise from instrumentation or infection. 4. Diverticulosis of the sigmoid colon without evidence of diverticulitis. Aortic Atherosclerosis (ICD10-I70.0). Electronically Signed   By: Lucienne Capers M.D.   On: 04/01/2018 21:25    ____________________________________________   PROCEDURES  Procedure(s) performed:   Procedures  None  ____________________________________________   INITIAL IMPRESSION / ASSESSMENT AND PLAN / ED COURSE  Pertinent labs & imaging results that were available during my care of the patient were reviewed by me and considered in my medical decision making (see chart for details).  Presents to the emergency department with concern for hyponatremia.  Family states that patient has had some confusion.  Suspect a hypovolemic etiology the patient has not been eating very well.  Her mucous membranes appear dry and skin turgor is poor.  Plan for gentle IV fluids pending repeat sodium.   Patient with sodium of 126 and UTI on UA. Patient started on abx and IVF at rate. Sent Urine sodium and creatinine. No urinary obstruction symptoms.   Discussed patient's case with Hospitalist, Dr. Alcario Drought to request admission. Patient and family (if present) updated with plan. Care transferred to [facility] service.  I reviewed all nursing notes, vitals, pertinent old records, EKGs, labs, imaging (as available).  ____________________________________________  FINAL  CLINICAL IMPRESSION(S) / ED DIAGNOSES  Final diagnoses:  Confusion  Generalized weakness  Hyponatremia  Acute cystitis without hematuria     MEDICATIONS GIVEN DURING THIS VISIT:  Medications  cefTRIAXone (ROCEPHIN) 1 g in sodium chloride 0.9 % 100 mL IVPB (has no administration in time range)  HYDROcodone-acetaminophen (NORCO/VICODIN) 5-325 MG per tablet 1-2 tablet (has no administration in time range)  hydrOXYzine (ATARAX/VISTARIL) tablet 25 mg (25 mg Oral Given 04/01/18 2214)  levothyroxine (SYNTHROID, LEVOTHROID) tablet 75 mcg (has no administration in time range)  pantoprazole (PROTONIX) EC tablet 40 mg (has no administration in time range)  senna (SENOKOT) tablet 8.6 mg (8.6 mg Oral Given 04/01/18 2214)  polyethylene glycol (MIRALAX / GLYCOLAX) packet 17 g (has no administration in time range)  nicotine polacrilex (NICORETTE) gum 2 mg (has no administration in time range)  0.9 %  sodium chloride infusion ( Intravenous New Bag/Given 04/01/18 2024)  acetaminophen (TYLENOL) tablet 650 mg (has no administration in time range)    Or  acetaminophen (TYLENOL) suppository 650 mg (has no administration in time range)  ondansetron (ZOFRAN) tablet 4 mg (has no administration in time range)    Or  ondansetron (  ZOFRAN) injection 4 mg (has no administration in time range)  enoxaparin (LOVENOX) injection 40 mg (has no administration in time range)  sodium phosphate (FLEET) 7-19 GM/118ML enema 1 enema (has no administration in time range)  Influenza vac split quadrivalent PF (FLUZONE HIGH-DOSE) injection 0.5 mL (has no administration in time range)  sodium chloride 0.9 % bolus 500 mL (0 mLs Intravenous Stopped 04/01/18 1846)  HYDROcodone-acetaminophen (NORCO/VICODIN) 5-325 MG per tablet 1 tablet (1 tablet Oral Given 04/01/18 2022)  cefTRIAXone (ROCEPHIN) 1 g in sodium chloride 0.9 % 100 mL IVPB (0 g Intravenous Stopped 04/01/18 2101)    Note:  This document was prepared using Dragon voice  recognition software and may include unintentional dictation errors.  Nanda Quinton, MD Emergency Medicine    Daveion Robar, [facility], MD 04/01/18 [phone]

## 2018-04-01 NOTE — ED Notes (Signed)
Patient transported to CT 

## 2018-04-01 NOTE — H&P (Signed)
History and Physical    [name]:[id number] DOB: Jun 09, 1941 DOA: 04/01/2018  PCP: Marin Olp, MD  Patient coming from: Home  I have personally briefly reviewed patient's old medical records in [place]  Chief Complaint: Hyponatremia  HPI: [name] [name] is a 77 y.o. female with medical history significant of COPD, chronic low back pain, pubic ramus fx.  She was discharged on 03/26/18 after admit with hyponatremia.  Improved that admit with isotonic saline and PO fluid restriction.  She had labs drawn on Friday for follow-up and was called today with results saying that her with sodium was 123.  Sent to ED.  She reports a poor appetite.  She has not continued her fluid restriction diet as she says she was told that she did not need a fluid restriction at home.  No new medications. Son at bedside reports some confusion. She is currently at a rehab facility following her discharge on the 8th.  Last BM was on Tues or Wed last week she reports.  Continues to have ongoing low back pain that has been persistent since the pubic ramus fx.  ED Course: Sodium 126, UA shows UTI.   Review of Systems: As per HPI otherwise 10 point review of systems negative.   Past Medical History:  Diagnosis Date  . Asymptomatic varicose veins   . Cataract    beginnings  . Cigarette smoker   . COPD (chronic obstructive pulmonary disease) ([place])   . Diverticulosis of colon (without mention of hemorrhage)   . Headache(784.0)   . Hearing loss    wears hearing aides  . Lumbar back pain   . Pure hypercholesterolemia   . Stress disorder, acute   . Varicose veins     Past Surgical History:  Procedure Laterality Date  . COLONOSCOPY  04-22-2004  . LUMBAR SPINE SURGERY     [phone]  . varicose vein treatment     at [facility]     reports that she has been smoking cigarettes. She has a 20.00 pack-year smoking history. She has never used smokeless tobacco. She  reports that she does not drink alcohol or use drugs.  Allergies  Allergen Reactions  . Atorvastatin     Intolerant to all statins   . Cyclobenzaprine Other (See Comments)    Bradycardia, hypotension  . Ezetimibe      INTOL to Zetia  . Rosuvastatin     intolerant to all statins  . Gadolinium Derivatives Nausea Only    Pt became very nauseous after gadolinium administration//lh  . Nicoderm [Nicotine] Rash    Unable to use the patches---caused severe rash to area placed    Family History  Problem Relation Age of Onset  . Diabetes Father   . Heart attack Mother 77       nonsmoker  . Colon cancer Neg Hx   . Rectal cancer Neg Hx   . Stomach cancer Neg Hx      Prior to Admission medications   Medication Sig Start Date End Date Taking? Authorizing Provider  HYDROcodone-acetaminophen (NORCO/VICODIN) 5-325 MG tablet Take 1-2 tablets by mouth every 6 (six) hours as needed for moderate pain or severe pain. 03/26/18  Yes Patrecia Pour, MD  hydrOXYzine (ATARAX/VISTARIL) 25 MG tablet Take 25 mg by mouth 3 (three) times daily as needed.   Yes [provider]  levothyroxine (SYNTHROID, LEVOTHROID) 75 MCG tablet TAKE 1 TABLET(75 MCG) BY MOUTH DAILY Patient taking differently: Take 75 mcg  by mouth daily before breakfast.  09/28/17  Yes Marin Olp, MD  omeprazole (PRILOSEC) 20 MG capsule Take 1 capsule (20 mg total) by mouth daily. 01/28/18  Yes Worley, Aldona Bar, PA  polyethylene glycol (MIRALAX / GLYCOLAX) packet Take 17 g by mouth daily. 03/26/18  Yes Patrecia Pour, MD  senna (SENOKOT) 8.6 MG TABS tablet Take 1 tablet (8.6 mg total) by mouth 2 (two) times daily. 03/26/18  Yes Patrecia Pour, MD  nicotine polacrilex (NICORETTE) 2 MG gum Take 1 each (2 mg total) by mouth as needed for smoking cessation. 03/26/18   Patrecia Pour, MD  ondansetron (ZOFRAN-ODT) 4 MG disintegrating tablet Take 1 tablet (4 mg total) by mouth every 8 (eight) hours as needed for nausea or vomiting. 03/04/18    Marin Olp, MD    Physical Exam: Vitals:   04/01/18 1700 04/01/18 1830 04/01/18 1930 04/01/18 2000  BP: 134/85 (!) 144/81 (!) 153/92 (!) 164/85  Pulse: 70 74 79 90  Resp: 16 16 16 16   Temp:      TempSrc:      SpO2: 98% 95% 96% 95%  Weight:      Height:        Constitutional: NAD, calm, comfortable Eyes: PERRL, lids and conjunctivae normal ENMT: Mucous membranes are moist. Posterior pharynx clear of any exudate or lesions.Normal dentition.  Neck: normal, supple, no masses, no thyromegaly Respiratory: clear to auscultation bilaterally, no wheezing, no crackles. Normal respiratory effort. No accessory muscle use.  Cardiovascular: Regular rate and rhythm, no murmurs / rubs / gallops. No extremity edema. 2+ pedal pulses. No carotid bruits.  Abdomen: no tenderness, no masses palpated. No hepatosplenomegaly. Bowel sounds positive.  Musculoskeletal: no clubbing / cyanosis. No joint deformity upper and lower extremities. Good ROM, no contractures. Normal muscle tone.  Skin: no rashes, lesions, ulcers. No induration Neurologic: CN 2-12 grossly intact. Sensation intact, DTR normal. Strength 5/5 in all 4.  Psychiatric: Normal judgment and insight. Alert and oriented x 3. Normal mood.    Labs on Admission: I have personally reviewed following labs and imaging studies  CBC: Recent Labs  Lab 04/01/18 1629  WBC 9.6  NEUTROABS 7.4  HGB 12.5  HCT 37.3  MCV 85.2  PLT 416*   Basic Metabolic Panel: Recent Labs  Lab 03/26/18 0408 03/26/18 1638 04/01/18 1629  NA 133* 132* 126*  K 3.7 4.2 4.1  CL 100 99 93*  CO2 25 24 24   GLUCOSE 96 127* 108*  BUN 15 19 16   CREATININE 0.66 0.78 0.82  CALCIUM 9.0 9.4 9.5   GFR: Estimated Creatinine Clearance: 54.1 mL/min (by C-G formula based on SCr of 0.82 mg/dL). Liver Function Tests: Recent Labs  Lab 04/01/18 1629  AST 28  ALT 16  ALKPHOS 193*  BILITOT 0.4  PROT 6.5  ALBUMIN 3.3*   No results for input(s): LIPASE, AMYLASE in the  last 168 hours. No results for input(s): AMMONIA in the last 168 hours. Coagulation Profile: No results for input(s): INR, PROTIME in the last 168 hours. Cardiac Enzymes: No results for input(s): CKTOTAL, CKMB, CKMBINDEX, TROPONINI in the last 168 hours. BNP (last 3 results) No results for input(s): PROBNP in the last 8760 hours. HbA1C: No results for input(s): HGBA1C in the last 72 hours. CBG: No results for input(s): GLUCAP in the last 168 hours. Lipid Profile: No results for input(s): CHOL, HDL, LDLCALC, TRIG, CHOLHDL, LDLDIRECT in the last 72 hours. Thyroid Function Tests: No results for input(s): TSH, T4TOTAL,  FREET4, T3FREE, THYROIDAB in the last 72 hours. Anemia Panel: No results for input(s): VITAMINB12, FOLATE, FERRITIN, TIBC, IRON, RETICCTPCT in the last 72 hours. Urine analysis:    Component Value Date/Time   COLORURINE YELLOW 04/01/2018 1917   APPEARANCEUR HAZY (A) 04/01/2018 1917   LABSPEC 1.006 04/01/2018 1917   PHURINE 6.0 04/01/2018 [place] 04/01/2018 1917   GLUCOSEU NEGATIVE 01/28/2018 1055   HGBUR MODERATE (A) 04/01/2018 1917   BILIRUBINUR NEGATIVE 04/01/2018 1917   BILIRUBINUR n 07/16/2015 [place] 04/01/2018 1917   PROTEINUR NEGATIVE 04/01/2018 1917   UROBILINOGEN 0.2 01/28/2018 1055   NITRITE POSITIVE (A) 04/01/2018 1917   LEUKOCYTESUR LARGE (A) 04/01/2018 1917    Radiological Exams on Admission: Ct Abdomen Pelvis Wo Contrast  Result Date: 04/01/2018 CLINICAL DATA:  Back pain possibly from a pubic ramus fracture. Urinary tract infection. No bowel movement in 1 week. Dysphagia to solids for couple of months. Hyponatremia. Smoker. EXAM: CT ABDOMEN AND PELVIS WITHOUT CONTRAST TECHNIQUE: Multidetector CT imaging of the abdomen and pelvis was performed following the standard protocol without IV contrast. COMPARISON:  CT pelvis 02/27/2018 FINDINGS: Lower chest: Atelectasis and scarring in the lung bases. Mild bronchiectasis  with some mucous plugging. Hepatobiliary: Evaluation of the liver is limited without IV contrast material but there are mildly hypodense poorly circumscribed lesions in the medial segment left lobe measuring 2.1 cm diameter and in the dome of the liver measuring 2.2 cm diameter. Density measurements are not consistent with a simple cyst. Consider possibility of metastasis or other solid lesion. Suggest initial characterization with contrast-enhanced CT or MRI. Gallbladder is distended without stone or inflammatory infiltration. No bile duct dilatation. Pancreas: Diffuse fatty infiltration of the pancreas. Spleen: Normal in size without focal abnormality. Adrenals/Urinary Tract: No adrenal gland nodules. Kidneys appear symmetrical. Mildly dilated right renal collecting system and right ureter with slight infiltration around the right kidney. No obstructing stone is identified. This could be sequela of recently passed stone or pyelonephritis. Small amount of gas in the bladder could arise from instrumentation or infection. No bladder wall thickening. Stomach/Bowel: Stomach and small bowel are decompressed. Diffusely stool-filled colon without distention. No wall thickening or inflammatory changes. Diverticulosis of the sigmoid colon without evidence of diverticulitis. Appendix is not identified. Vascular/Lymphatic: Aortic atherosclerosis. No enlarged abdominal or pelvic lymph nodes. Reproductive: Uterus and bilateral adnexa are unremarkable. Other: No abdominal wall hernia or abnormality. No abdominopelvic ascites. Musculoskeletal: Healing fractures of the right superior and inferior pubic rami and of the sacral ala bilaterally. Also fracture of the right transverse process of L5. Degenerative changes throughout the spine. No destructive bone lesions. IMPRESSION: 1. Healing fractures of the right superior and inferior pubic rami and of the sacral ala bilaterally. Fracture of the right transverse process of L5. 2.  Indeterminate hypodense lesions in the medial segment left lobe of the liver measuring 2.1 cm diameter and 2.2 cm diameter. Suggest initial characterization with contrast-enhanced CT or MRI. 3. Mildly dilated right renal collecting system and right ureter with mild infiltration around the right kidney. No obstructing stone is identified. This could be sequela of recently passed stone or pyelonephritis. Small amount of gas in the bladder could arise from instrumentation or infection. 4. Diverticulosis of the sigmoid colon without evidence of diverticulitis. Aortic Atherosclerosis (ICD10-I70.0). Electronically Signed   By: Lucienne Capers M.D.   On: 04/01/2018 21:25    EKG: Independently reviewed.  Assessment/Plan Principal Problem:   Hyponatremia Active Problems:   Hypothyroidism  CIGARETTE SMOKER   Pubic ramus fracture (HCC)   Acute lower UTI   Constipation    1. Hyponatremia - 1. Urine sodium and Creat 2. Resume fluid restriction that worked last admit 3. NS 100 cc/hr (also worked last week). 4. Repeat BMP in AM 2. Acute UTI - 1. UCx pending 2. Rocephin 3. Pubic ramus fx - 1. Continue norco for ongoing back pain 4. Constipation - 1. Getting CT abd/pelvis non contrast: 1. R/O stone 2. R/o obstruction 3. And look for any evidence of metastatic SCLC as a cause for the hyponatremia in this smoker. 4. Also look for cause of multi month reported dysphagia 2. Continue home laxatives (asuming no obstruction) 3. Fleets PRN if needed 5. Hypothyroidism - continue synthroid 6. Cigarette smoker - 1. PRN nicorette  DVT prophylaxis: Lovenox Code Status: Full Family Communication: Family at bedside Disposition Plan: Rehab after admit Consults called: None Admission status: Place in [place] for now    Etta Quill. DO [facility] Pager [phone] Only works nights!  If 7AM-7PM, please contact the primary day team physician taking care of patient  [email or web address] Password  [email or web address]  04/01/2018, 9:28 PM

## 2018-04-01 NOTE — ED Notes (Signed)
Bed: WA21 Expected date:  Expected time:  Means of arrival:  Comments: EMS-forgot

## 2018-04-01 NOTE — Telephone Encounter (Signed)
[  04/01/2018 1:09 PM]  [name]:   please speak with patient's husband. I dont have control over which nursing facility she is in. He can talk to Education officer, museum at that location if interested in transfer. I dont have control over bloodwork there.  I would need to see physician recommendations at facility about going home- see if they can get provider there to evaluate patient and see if she is appropriate she is HIGH RISK for rehospitalization so dont want to rush her out

## 2018-04-01 NOTE — Telephone Encounter (Signed)
See note  Copied from [place] [phone]. Topic: General - Inquiry >> Apr 01, 2018 12:13 PM Margot Ables wrote: Reason for CRM: Pt went to [facility] Thursday 03/27/18. Pt husband states he is not happy. They took blood Friday and was upset they wouldn't have results until Monday. He states today they still don't have results. Pt walked 800-900 steps on Friday using walker. He is wanting to get in home health care. Mel is requesting call back from Dr. Yong Channel this evening if possible

## 2018-04-01 NOTE — Telephone Encounter (Signed)
Patient disposition: ER

## 2018-04-01 NOTE — ED Triage Notes (Addendum)
Pt BIB EMS from [facility]. Pt received blood work results today and it showed a low sodium of 123. Pt has been going to [facility] for rehab after admission here last week. Pt endorses having incontinence of urine so rehab services were recommended. Pt reports that [facility] is "not doing anything for her" and wants rehab somewhere else.

## 2018-04-02 ENCOUNTER — Ambulatory Visit (INDEPENDENT_AMBULATORY_CARE_PROVIDER_SITE_OTHER): Payer: Medicare Other | Admitting: Orthopaedic Surgery

## 2018-04-02 DIAGNOSIS — S32599D Other specified fracture of unspecified pubis, subsequent encounter for fracture with routine healing: Secondary | ICD-10-CM

## 2018-04-02 DIAGNOSIS — I10 Essential (primary) hypertension: Secondary | ICD-10-CM | POA: Diagnosis present

## 2018-04-02 DIAGNOSIS — B965 Pseudomonas (aeruginosa) (mallei) (pseudomallei) as the cause of diseases classified elsewhere: Secondary | ICD-10-CM | POA: Diagnosis present

## 2018-04-02 DIAGNOSIS — M545 Low back pain: Secondary | ICD-10-CM | POA: Diagnosis present

## 2018-04-02 DIAGNOSIS — H9190 Unspecified hearing loss, unspecified ear: Secondary | ICD-10-CM | POA: Diagnosis present

## 2018-04-02 DIAGNOSIS — J449 Chronic obstructive pulmonary disease, unspecified: Secondary | ICD-10-CM | POA: Diagnosis present

## 2018-04-02 DIAGNOSIS — R410 Disorientation, unspecified: Secondary | ICD-10-CM | POA: Diagnosis present

## 2018-04-02 DIAGNOSIS — F17210 Nicotine dependence, cigarettes, uncomplicated: Secondary | ICD-10-CM | POA: Diagnosis present

## 2018-04-02 DIAGNOSIS — Z1612 Extended spectrum beta lactamase (ESBL) resistance: Secondary | ICD-10-CM | POA: Diagnosis present

## 2018-04-02 DIAGNOSIS — Z8249 Family history of ischemic heart disease and other diseases of the circulatory system: Secondary | ICD-10-CM | POA: Diagnosis not present

## 2018-04-02 DIAGNOSIS — E8770 Fluid overload, unspecified: Secondary | ICD-10-CM | POA: Diagnosis present

## 2018-04-02 DIAGNOSIS — K769 Liver disease, unspecified: Secondary | ICD-10-CM | POA: Diagnosis present

## 2018-04-02 DIAGNOSIS — G8929 Other chronic pain: Secondary | ICD-10-CM | POA: Diagnosis present

## 2018-04-02 DIAGNOSIS — N3000 Acute cystitis without hematuria: Secondary | ICD-10-CM | POA: Diagnosis not present

## 2018-04-02 DIAGNOSIS — T40605A Adverse effect of unspecified narcotics, initial encounter: Secondary | ICD-10-CM | POA: Diagnosis present

## 2018-04-02 DIAGNOSIS — K5900 Constipation, unspecified: Secondary | ICD-10-CM | POA: Diagnosis not present

## 2018-04-02 DIAGNOSIS — D649 Anemia, unspecified: Secondary | ICD-10-CM | POA: Diagnosis present

## 2018-04-02 DIAGNOSIS — S32591A Other specified fracture of right pubis, initial encounter for closed fracture: Secondary | ICD-10-CM | POA: Diagnosis present

## 2018-04-02 DIAGNOSIS — N390 Urinary tract infection, site not specified: Secondary | ICD-10-CM

## 2018-04-02 DIAGNOSIS — E871 Hypo-osmolality and hyponatremia: Secondary | ICD-10-CM | POA: Diagnosis not present

## 2018-04-02 DIAGNOSIS — E7800 Pure hypercholesterolemia, unspecified: Secondary | ICD-10-CM | POA: Diagnosis present

## 2018-04-02 DIAGNOSIS — X58XXXA Exposure to other specified factors, initial encounter: Secondary | ICD-10-CM | POA: Diagnosis present

## 2018-04-02 DIAGNOSIS — Z23 Encounter for immunization: Secondary | ICD-10-CM | POA: Diagnosis present

## 2018-04-02 DIAGNOSIS — F329 Major depressive disorder, single episode, unspecified: Secondary | ICD-10-CM | POA: Diagnosis present

## 2018-04-02 DIAGNOSIS — B9620 Unspecified Escherichia coli [E. coli] as the cause of diseases classified elsewhere: Secondary | ICD-10-CM | POA: Diagnosis present

## 2018-04-02 DIAGNOSIS — Z79890 Hormone replacement therapy (postmenopausal): Secondary | ICD-10-CM | POA: Diagnosis not present

## 2018-04-02 DIAGNOSIS — R531 Weakness: Secondary | ICD-10-CM | POA: Diagnosis not present

## 2018-04-02 DIAGNOSIS — E039 Hypothyroidism, unspecified: Secondary | ICD-10-CM | POA: Diagnosis not present

## 2018-04-02 LAB — BASIC METABOLIC PANEL
ANION GAP: 6 (ref 5–15)
BUN: 16 mg/dL (ref 8–23)
CHLORIDE: 99 mmol/L (ref 98–111)
CO2: 25 mmol/L (ref 22–32)
Calcium: 9.3 mg/dL (ref 8.9–10.3)
Creatinine, Ser: 0.77 mg/dL (ref 0.44–1.00)
GFR calc Af Amer: >60 mL/min (ref >60)
GFR calc non Af Amer: >60 mL/min (ref >60)
GLUCOSE: 97 mg/dL (ref 70–99)
Potassium: 4.1 mmol/L (ref 3.5–5.1)
Sodium: 130 mmol/L — ABNORMAL LOW (ref 135–145)

## 2018-04-02 LAB — CREATININE, URINE, RANDOM: CREATININE, URINE: 37.03 mg/dL

## 2018-04-02 LAB — MRSA PCR SCREENING: MRSA BY PCR: NEGATIVE

## 2018-04-02 NOTE — Progress Notes (Signed)
PROGRESS NOTE    [name] [name]  ZDG:[id number] DOB: January 10, 1941 DOA: 04/01/2018 PCP: Marin Olp, MD   Brief Narrative:  HPI per Dr. Jennette Kettle on 04/01/18 [name] [name] is a 77 y.o. female with medical history significant of COPD, chronic low back pain, pubic ramus fx.  She was discharged on 10/08/19after admit with hyponatremia.  Improved that admit with isotonic saline and PO fluid restriction.  She had labs drawn on Friday for follow-up and was called today with results saying that her with sodium was 123.  Sent to ED.  She reports a poor appetite.She has not continued her fluid restriction diet as she saysshe was told thatshe did not need a fluid restriction at home.No new medications. Son at bedside reports some confusion. She is currently at a rehab facility following her discharge on the 8th.  Last BM was on Tues or Wed last week she reports.  Continues to have ongoing low back pain that has been persistent since the pubic ramus fx.  **States that she feels a little bit better but still feels weak.  Had a bowel movement finally.  Assessment & Plan:   Principal Problem:   Hyponatremia Active Problems:   Hypothyroidism   C[employer]ARETTE SMO[employer]   Pubic ramus fracture (HCC)   Acute lower UTI   Constipation  Hyponatremia  -Improving and went from 126 -> 130 -Still feels weak and PT/OT Consulted  -Urine sodium and Creat -Resume fluid restriction that worked last admit -Contunue with IVF with NS 100 cc/hr (also worked last week). -Repeat CMP in AM  Acute Urinary Tract Infection -Patient's urinalysis on admission showed hazy appearance with moderate blood and hemoglobin, large leukocytes, positive nitrites, few bacteria, 11-20 red blood cells per high-power field and greater than 50 WBCs -Urine cultures pending -CT of the abdomen pelvis showed mildly dilated right renal collecting system and right ureter with mild infiltration around the kidney.  There is  no obstructing stone that was identified.   -Continue with empiric treatment with IV ceftriaxone and neuro therapies based on sensitivities  Pubic Ramic Fractures and Right Transvers Process Fractures of L5 -CT of the abdomen pelvis showed healing fracture of the right superior and inferior pubic rami and of the sacral Olla bilaterally.  Is also fracture of the right transverse process of L5 -Continue with supportive care -PT/OT to evaluate and treat -C/w Hydrocodone-Acetaminophen 1-2 tabs p.o. every 6 PRN for moderate and severe pain -Also continue with acetaminophen 650 mill grams p.o./RC every 6 PRN for mild pain or fever greater than 101  Constipation, improved -Getting CT abd/pelvis non contrast and the stomach and small bowel were decompressed. The scan showed diffusely stool-filled colon without distention.  No wall thickening or inflammatory changes and diverticulosis of the sigmoid colon without evidence of diverticulitis. -In the setting of narcotics -Continue with Senokot 8.6 mill grams p.o. twice daily, MiraLAX 17 g p.o. daily, and a sodium phosphate Fleet enema rectally daily PRN for severe constipation  Hypothyroidism -Continue with levothyroxine 75 mcg daily before breakfast -Check TSH in the a.m.  Tobacco Abuse/Cigarette Smoker -Smoking cessation counseling given -Continue nicotine polacrilex 2 mg po PRN smoking Cessastion  Hypodense liver lesions -CT of the abdomen pelvis showed indeterminate hypodense lesion in the medial segment of the left lobe of the liver measuring 2.1 cm in diameter and 2.2 cm diameter -Will need a contrast-enhanced CT or an MRI and this can be done as an outpatient -We will need to inform the patient  about the liver lesions  DVT prophylaxis: Continue with enoxaparin 40 mg subcu every 24 hours  Code Status: FULL CODE Family Communication: Discussed with Son and Husband at bedside Disposition Plan: Pending further evaluation by PT/OT but likely  discharge to skilled nursing facility when medically stable and patient was changed to inpatient status and she is still hyponatremic and still feels very weak  Consultants:   None   Procedures: None   Antimicrobials:  Anti-infectives (From admission, onward)   Start     Dose/Rate Route Frequency Ordered Stop   04/02/18 2200  cefTRIAXone (ROCEPHIN) 1 g in sodium chloride 0.9 % 100 mL IVPB     1 g 200 mL/hr over 30 Minutes Intravenous Every 24 hours 04/01/18 2002     04/01/18 2000  cefTRIAXone (ROCEPHIN) 1 g in sodium chloride 0.9 % 100 mL IVPB     1 g 200 mL/hr over 30 Minutes Intravenous  Once 04/01/18 1950 04/01/18 2101     Subjective: Seen and examined at bedside and states that she was to have abdominal.  No chest pain, lightheadedness or dizziness however does still feel very weak.  States that it would help if she received a pain pill before she ambulates with PT.  On other concerns or complaints at this time and family states that she was drinking a lot of water her to coming in because she is urinating very frequently likely attributable to the urinary tract infection.  Objective: Vitals:   04/01/18 2000 04/01/18 2158 04/02/18 0501 04/02/18 1408  BP: (!) 164/85 (!) 157/99 (!) 147/58 114/79  Pulse: 90 85 80 84  Resp: 16 18 (!) 22 18  Temp:  97.8 F (36.6 C) 98 F (36.7 C) 97.9 F (36.6 C)  TempSrc:  Oral Oral   SpO2: 95% 95% 98% 97%  Weight:      Height:        Intake/Output Summary (Last 24 hours) at 04/02/2018 1501 Last data filed at 04/02/2018 1033 Gross per 24 hour  Intake 1560 ml  Output 1650 ml  Net -90 ml   Filed Weights   04/01/18 1506  Weight: 66.5 kg   Examination: Physical Exam:  Constitutional: WN/WD Caucasian female in NAD and appears calm but is uncomfortable Eyes: Lids and conjunctivae normal, sclerae anicteric  ENMT: External Ears, Nose appear normal. Grossly normal hearing. Neck: Appears normal, supple, no cervical masses, normal ROM,  no appreciable thyromegaly; no JVD Respiratory: Diminished to auscultation bilaterally, no wheezing, rales, rhonchi or crackles. Normal respiratory effort and patient is not tachypenic. No accessory muscle use.  Cardiovascular: RRR, no murmurs / rubs / gallops. S1 and S2 auscultated. Trace extremity edema. Abdomen: Soft, non-tender, Distended slightly. No masses palpated. No appreciable hepatosplenomegaly. Bowel sounds positive x4.  GU: Deferred. Musculoskeletal: No clubbing / cyanosis of digits/nails. No contractures Skin: No rashes, lesions, ulcers on a limited skin evaluation. No induration; Warm and dry.  Neurologic: CN 2-12 grossly intact with no focal deficits.  Romberg sign and cerebellar reflexes not assessed.  Psychiatric: Normal judgment and insight. Alert and oriented x 3. Normal mood and appropriate affect.   Data Reviewed: I have personally reviewed following labs and imaging studies  CBC: Recent Labs  Lab 04/01/18 1629  WBC 9.6  NEUTROABS 7.4  HGB 12.5  HCT 37.3  MCV 85.2  PLT 147*   Basic Metabolic Panel: Recent Labs  Lab 03/26/18 1638 04/01/18 1629 04/02/18 0558  NA 132* 126* 130*  K 4.2 4.1 4.1  CL  99 93* 99  CO2 24 24 25   GLUCOSE 127* 108* 97  BUN 19 16 16   CREATININE 0.78 0.82 0.77  CALCIUM 9.4 9.5 9.3   GFR: Estimated Creatinine Clearance: 55.5 mL/min (by C-G formula based on SCr of 0.77 mg/dL). Liver Function Tests: Recent Labs  Lab 04/01/18 1629  AST 28  ALT 16  ALKPHOS 193*  BILITOT 0.4  PROT 6.5  ALBUMIN 3.3*   No results for input(s): LIPASE, AMYLASE in the last 168 hours. No results for input(s): AMMONIA in the last 168 hours. Coagulation Profile: No results for input(s): INR, PROTIME in the last 168 hours. Cardiac Enzymes: No results for input(s): CKTOTAL, CKMB, CKMBINDEX, TROPONINI in the last 168 hours. BNP (last 3 results) No results for input(s): PROBNP in the last 8760 hours. HbA1C: No results for input(s): HGBA1C in the last  72 hours. CBG: No results for input(s): GLUCAP in the last 168 hours. Lipid Profile: No results for input(s): CHOL, HDL, LDLCALC, TRIG, CHOLHDL, LDLDIRECT in the last 72 hours. Thyroid Function Tests: No results for input(s): TSH, T4TOTAL, FREET4, T3FREE, THYROIDAB in the last 72 hours. Anemia Panel: No results for input(s): VITAMINB12, FOLATE, FERRITIN, TIBC, IRON, RETICCTPCT in the last 72 hours. Sepsis Labs: No results for input(s): PROCALCITON, LATICACIDVEN in the last 168 hours.  Recent Results (from the past 240 hour(s))  MRSA PCR Screening     Status: None   Collection Time: 04/02/18  6:04 AM  Result Value Ref Range Status   MRSA by PCR NEGATIVE NEGATIVE Final    Comment:        The GeneXpert MRSA Assay (FDA approved for NASAL specimens only), is one component of a comprehensive MRSA colonization surveillance program. It is not intended to diagnose MRSA infection nor to guide or monitor treatment for MRSA infections. Performed at [facility], [place] [address], [place], [place] [address]     Radiology Studies: Ct Abdomen Pelvis Wo Contrast  Result Date: 04/01/2018 CLINICAL DATA:  Back pain possibly from a pubic ramus fracture. Urinary tract infection. No bowel movement in 1 week. Dysphagia to solids for couple of months. Hyponatremia. Smoker. EXAM: CT ABDOMEN AND PELVIS WITHOUT CONTRAST TECHNIQUE: Multidetector CT imaging of the abdomen and pelvis was performed following the standard protocol without IV contrast. COMPARISON:  CT pelvis 02/27/2018 FINDINGS: Lower chest: Atelectasis and scarring in the lung bases. Mild bronchiectasis with some mucous plugging. Hepatobiliary: Evaluation of the liver is limited without IV contrast material but there are mildly hypodense poorly circumscribed lesions in the medial segment left lobe measuring 2.1 cm diameter and in the dome of the liver measuring 2.2 cm diameter. Density measurements are not consistent with a simple  cyst. Consider possibility of metastasis or other solid lesion. Suggest initial characterization with contrast-enhanced CT or MRI. Gallbladder is distended without stone or inflammatory infiltration. No bile duct dilatation. Pancreas: Diffuse fatty infiltration of the pancreas. Spleen: Normal in size without focal abnormality. Adrenals/Urinary Tract: No adrenal gland nodules. Kidneys appear symmetrical. Mildly dilated right renal collecting system and right ureter with slight infiltration around the right kidney. No obstructing stone is identified. This could be sequela of recently passed stone or pyelonephritis. Small amount of gas in the bladder could arise from instrumentation or infection. No bladder wall thickening. Stomach/Bowel: Stomach and small bowel are decompressed. Diffusely stool-filled colon without distention. No wall thickening or inflammatory changes. Diverticulosis of the sigmoid colon without evidence of diverticulitis. Appendix is not identified. Vascular/Lymphatic: Aortic atherosclerosis. No enlarged  abdominal or pelvic lymph nodes. Reproductive: Uterus and bilateral adnexa are unremarkable. Other: No abdominal wall hernia or abnormality. No abdominopelvic ascites. Musculoskeletal: Healing fractures of the right superior and inferior pubic rami and of the sacral ala bilaterally. Also fracture of the right transverse process of L5. Degenerative changes throughout the spine. No destructive bone lesions. IMPRESSION: 1. Healing fractures of the right superior and inferior pubic rami and of the sacral ala bilaterally. Fracture of the right transverse process of L5. 2. Indeterminate hypodense lesions in the medial segment left lobe of the liver measuring 2.1 cm diameter and 2.2 cm diameter. Suggest initial characterization with contrast-enhanced CT or MRI. 3. Mildly dilated right renal collecting system and right ureter with mild infiltration around the right kidney. No obstructing stone is  identified. This could be sequela of recently passed stone or pyelonephritis. Small amount of gas in the bladder could arise from instrumentation or infection. 4. Diverticulosis of the sigmoid colon without evidence of diverticulitis. Aortic Atherosclerosis (ICD10-I70.0). Electronically Signed   By: Lucienne Capers M.D.   On: 04/01/2018 21:25   Scheduled Meds: . enoxaparin (LOVENOX) injection  40 mg Subcutaneous Q24H  . Influenza vac split quadrivalent PF  0.5 mL Intramuscular Tomorrow-1000  . levothyroxine  75 mcg Oral QAC breakfast  . pantoprazole  40 mg Oral Daily  . polyethylene glycol  17 g Oral Daily  . senna  1 tablet Oral BID   Continuous Infusions: . sodium chloride 100 mL/hr at 04/02/18 0833  . cefTRIAXone (ROCEPHIN)  IV      LOS: 0 days   Kerney Elbe, DO [facility] PAGER is on AMION  If 7PM-7AM, please contact night-coverage [email or web address] Password [email or web address] 04/02/2018, 3:01 PM

## 2018-04-02 NOTE — Evaluation (Signed)
Physical Therapy Evaluation Patient Details Name: [name] [name] MRN: [id number] DOB: 11-Apr-1941 Today's Date: 04/02/2018   History of Present Illness  77 yo female admitted from SNF with hyponatremia, UTI, weakness. Recent hx of R pubic ramus fx, sacral fx, R L5 transverse process fx. Hx of COPD, hypothyroidism, chronic back pain.   Clinical Impression  On eval, pt required Min assist for mobility. She walked ~100 feet with a RW. Pain rated 6/10 in low back with activity. Pt is very eager to mobilize and regain independence. Discussed d/c plan-pt is refusing SNF placement. She plans to return home, with husband at discharge. Will recommend HHPT and a home health aide, if possible, to assist with ADLs since pt's husband is elderly and only able to provide limited assistance.     Follow Up Recommendations Home health PT;Supervision/Assistance - 24 hour; Home Health Aide (pt refusing SNF placement)    Equipment Recommendations  None recommended by PT    Recommendations for Other Services       Precautions / Restrictions Precautions Precautions: Fall Restrictions Weight Bearing Restrictions: No      Mobility  Bed Mobility Overal bed mobility: Needs Assistance Bed Mobility: Supine to Sit;Sit to Supine     Supine to sit: Min guard Sit to supine: Min guard;HOB elevated   General bed mobility comments: close guard for safety.   Transfers Overall transfer level: Needs assistance Equipment used: Rolling walker (2 wheeled) Transfers: Sit to/from Stand Sit to Stand: Min assist         General transfer comment: VCs for safety, technique, hand placement. Assist to rise, stabilize pt/walker, control descent.   Ambulation/Gait Ambulation/Gait assistance: Min guard Gait Distance (Feet): 100 Feet Assistive device: Rolling walker (2 wheeled) Gait Pattern/deviations: Step-through pattern;Decreased stride length;Trunk flexed     General Gait Details: Close guard for safety. Pt  c/o low back pain during ambulation. She tolerated distance fairly well.   Stairs            Wheelchair Mobility    Modified Rankin (Stroke Patients Only)       Balance Overall balance assessment: Needs assistance         Standing balance support: Bilateral upper extremity supported Standing balance-Leahy Scale: Poor                               Pertinent Vitals/Pain Pain Assessment: 0-10 Pain Score: 6  Pain Location: low back Pain Descriptors / Indicators: Discomfort;Sore;Aching Pain Intervention(s): Limited activity within patient's tolerance;Repositioned    Home Living Family/patient expects to be discharged to:: Unsure Living Arrangements: Spouse/significant other Available Help at Discharge: Family;Available 24 hours/day Type of Home: House     Entrance Stairs-Number of Steps: 1  Home Layout: One level Home Equipment: Walker - 2 wheels;Cane - single point;Bedside commode Additional Comments: pt has limited help with her husband due to his extended age    Prior Function Level of Independence: Needs assistance   Gait / Transfers Assistance Needed: was working with PT at [facility]           Hand Dominance        Extremity/Trunk Assessment   Upper Extremity Assessment Upper Extremity Assessment: Defer to OT evaluation    Lower Extremity Assessment Lower Extremity Assessment: Generalized weakness    Cervical / Trunk Assessment Cervical / Trunk Assessment: Kyphotic  Communication   Communication: No difficulties  Cognition Arousal/Alertness: Awake/alert Behavior During Therapy: [facility] for tasks  assessed/performed Overall Cognitive Status: Within Functional Limits for tasks assessed                                        General Comments      Exercises Total Joint Exercises Ankle Circles/Pumps: AROM;Both;10 reps;Supine Quad Sets: AROM;Both;10 reps;Supine Heel Slides: AROM;Both;10 reps;Supine Hip  ABduction/ADduction: AROM;Both;10 reps;Supine   Assessment/Plan    PT Assessment Patient needs continued PT services  PT Problem List Decreased strength;Decreased balance;Decreased activity tolerance;Decreased mobility;Pain;Decreased knowledge of use of DME;Decreased safety awareness       PT Treatment Interventions DME instruction;Functional mobility training;Balance training;Patient/family education;Gait training;Therapeutic activities;Therapeutic exercise    PT Goals (Current goals can be found in the Care Plan section)  Acute Rehab PT Goals Patient Stated Goal: home! PT Goal Formulation: With patient/family Time For Goal Achievement: 04/16/18 Potential to Achieve Goals: Good    Frequency Min 3X/week   Barriers to discharge        Co-evaluation               AM-PAC PT "6 Clicks" Daily Activity  Outcome Measure Difficulty turning over in bed (including adjusting bedclothes, sheets and blankets)?: A Little Difficulty moving from lying on back to sitting on the side of the bed? : A Little Difficulty sitting down on and standing up from a chair with arms (e.g., wheelchair, bedside commode, etc,.)?: Unable Help needed moving to and from a bed to chair (including a wheelchair)?: A Little Help needed walking in hospital room?: A Little Help needed climbing 3-5 steps with a railing? : A Lot 6 Click Score: 15    End of Session Equipment Utilized During Treatment: Gait belt Activity Tolerance: Patient limited by pain Patient left: in bed;with call bell/phone within reach;with bed alarm set;with family/visitor present   PT Visit Diagnosis: Muscle weakness (generalized) (M62.81);Pain;Unsteadiness on feet (R26.81) Pain - part of body: (back)    Time: 5625-6389 PT Time Calculation (min) (ACUTE ONLY): 28 min   Charges:   PT Evaluation $PT Eval Moderate Complexity: 1 Mod PT Treatments $Gait Training: 8-22 mins         Weston Anna, PT Acute Rehabilitation  Services Pager: [phone] Office: [phone]

## 2018-04-03 ENCOUNTER — Other Ambulatory Visit: Payer: Self-pay | Admitting: Family Medicine

## 2018-04-03 DIAGNOSIS — B965 Pseudomonas (aeruginosa) (mallei) (pseudomallei) as the cause of diseases classified elsewhere: Secondary | ICD-10-CM | POA: Diagnosis present

## 2018-04-03 DIAGNOSIS — N3080 Other cystitis without hematuria: Secondary | ICD-10-CM

## 2018-04-03 LAB — COMPREHENSIVE METABOLIC PANEL
ALBUMIN: 3 g/dL — AB (ref 3.5–5.0)
ALT: 23 U/L (ref 0–44)
ANION GAP: 7 (ref 5–15)
AST: 29 U/L (ref 15–41)
Alkaline Phosphatase: 177 U/L — ABNORMAL HIGH (ref 38–126)
BILIRUBIN TOTAL: 0.3 mg/dL (ref 0.3–1.2)
BUN: 12 mg/dL (ref 8–23)
CHLORIDE: 99 mmol/L (ref 98–111)
CO2: 25 mmol/L (ref 22–32)
Calcium: 8.9 mg/dL (ref 8.9–10.3)
Creatinine, Ser: 0.62 mg/dL (ref 0.44–1.00)
GFR calc Af Amer: >60 mL/min (ref >60)
GFR calc non Af Amer: >60 mL/min (ref >60)
GLUCOSE: 105 mg/dL — AB (ref 70–99)
POTASSIUM: 4.2 mmol/L (ref 3.5–5.1)
Sodium: 131 mmol/L — ABNORMAL LOW (ref 135–145)
TOTAL PROTEIN: 6.1 g/dL — AB (ref 6.5–8.1)

## 2018-04-03 LAB — CBC WITH DIFFERENTIAL/PLATELET
Abs Immature Granulocytes: 0.06 10*3/uL (ref 0.00–0.07)
BASOS ABS: 0.1 10*3/uL (ref 0.0–0.1)
Basophils Relative: 1 %
EOS ABS: 0.2 10*3/uL (ref 0.0–0.5)
EOS PCT: 2 %
HCT: 34.7 % — ABNORMAL LOW (ref 36.0–46.0)
Hemoglobin: 11.3 g/dL — ABNORMAL LOW (ref 12.0–15.0)
IMMATURE GRANULOCYTES: 1 %
Lymphocytes Relative: 13 %
Lymphs Abs: 1.2 10*3/uL (ref 0.7–4.0)
MCH: 28.9 pg (ref 26.0–34.0)
MCHC: 32.6 g/dL (ref 30.0–36.0)
MCV: 88.7 fL (ref 80.0–100.0)
Monocytes Absolute: 0.9 10*3/uL (ref 0.1–1.0)
Monocytes Relative: 10 %
NEUTROS PCT: 73 %
NRBC: 0 % (ref 0.0–0.2)
Neutro Abs: 6.9 10*3/uL (ref 1.7–7.7)
Platelets: 382 10*3/uL (ref 150–400)
RBC: 3.91 MIL/uL (ref 3.87–5.11)
RDW: 13.4 % (ref 11.5–15.5)
WBC: 9.3 10*3/uL (ref 4.0–10.5)

## 2018-04-03 LAB — MAGNESIUM: MAGNESIUM: 2 mg/dL (ref 1.7–2.4)

## 2018-04-03 LAB — PHOSPHORUS: Phosphorus: 2.2 mg/dL — ABNORMAL LOW (ref 2.5–4.6)

## 2018-04-03 MED ORDER — PIPERACILLIN-TAZOBACTAM 3.375 G IVPB
3.3750 g | Freq: Three times a day (TID) | INTRAVENOUS | Status: DC
  Administered 2018-04-03 – 2018-04-04 (×4): 3.375 g via INTRAVENOUS
  Filled 2018-04-03 (×6): qty 50

## 2018-04-03 NOTE — Progress Notes (Signed)
OT Cancellation Note  Patient Details Name: ROTUNDA WORDEN MRN: [id number] DOB: August 27, 1940   Cancelled Treatment:    Reason Eval/Treat Not Completed: Other (comment)  Pt had just worked with PT and had gotten back in bed- will check on pt in the morning Kari Baars, [place] [phone] Office- [phone], [name] 04/03/2018, 3:17 PM

## 2018-04-03 NOTE — Progress Notes (Signed)
PROGRESS NOTE  [name] [name] MVH:[id number] DOB: [birth year] DOA: 04/01/2018 PCP: Marin Olp, MD  HPI/Brief Narrative  [name] [name] is a 77 y.o. year old female with medical history significant for COPD, chronic low back pain, pubic ramus fracture, recent admission and discharge on 03/26/2018 for acute hyponatremia treated with isotonic saline and p.o. fluid restriction.  Patient presented on 04/01/2018 after abnormal outpatient sodium value of 123 (on dischargeSodium was 132) Subjective Wants to physical therapy today.  Otherwise no acute complaints.  Assessment/Plan:  #Acute on chronic hyponatremia, continues to improve. Currently 131 consistent with last normal sodium on prior admission., nadir of 123 prompting admission. ON IVF but no signs of being volume down on exam currently and doing well eating so will discontinue fluids and repeat BMP In 24 hours to assess.  Suspect multifactorial etiology (decreased solute intake related to high water, low-salt diet, some endocrinology related to hypothyroidism)   #Pseudomonal UTI. Switched from IV ceftriaxone to IV zosyn on 10/16, pending urine culture sensitivities.   #Constipation, resolved.  Related to opiate regimen.  Improved with optimization of bowel regimen.  Continue to monitor.  #Hypothyroidism.  Last TSH on admission elevated at 6.213.  Continue current Synthroid, will need follow-up as outpatient  #COPD, stable.  No wheezing on exam on room air.  #Chronic deconditioning, exacerbated by subacute pubic ramus fracture.  Patient discharged to SNF on last admission.  Code Status: Full code  Family Communication: Family updated bedside  Disposition Plan: Repeat BMP in a.m., pending urine culture sensitivities.   Consultants:  None     Procedures:  None  Antimicrobials: Anti-infectives (From admission, onward)   Start     Dose/Rate Route Frequency Ordered Stop   04/03/18 1000  piperacillin-tazobactam (ZOSYN)  IVPB 3.375 g     3.375 g 12.5 mL/hr over 240 Minutes Intravenous Every 8 hours 04/03/18 0815     04/02/18 2200  cefTRIAXone (ROCEPHIN) 1 g in sodium chloride 0.9 % 100 mL IVPB  Status:  Discontinued     1 g 200 mL/hr over 30 Minutes Intravenous Every 24 hours 04/01/18 2002 04/03/18 0802   04/01/18 2000  cefTRIAXone (ROCEPHIN) 1 g in sodium chloride 0.9 % 100 mL IVPB     1 g 200 mL/hr over 30 Minutes Intravenous  Once 04/01/18 1950 04/01/18 2101         Cultures:  04/01/2018, urine culture with Pseudomonas, pending sensitivities  Telemetry: No  DVT prophylaxis: Lovenox   Objective: Vitals:   04/02/18 0501 04/02/18 1408 04/02/18 2045 04/03/18 0550  BP: (!) 147/58 114/79 120/69 126/78  Pulse: 80 84 74 69  Resp: (!) 22 18 18 17   Temp: 98 F (36.7 C) 97.9 F (36.6 C) 97.9 F (36.6 C) 98.5 F (36.9 C)  TempSrc: Oral  Oral Oral  SpO2: 98% 97% 95% 96%  Weight:      Height:        Intake/Output Summary (Last 24 hours) at 04/03/2018 1117 Last data filed at 04/03/2018 1044 Gross per 24 hour  Intake 1355.32 ml  Output 3150 ml  Net -1794.68 ml   Filed Weights   04/01/18 1506  Weight: 66.5 kg    Exam:  Gen- alert, no distress Eyes0 EOMI, anicteric ENMT- moist oral mucosa CV-RRR, no murmurs, no edema Abdomen- soft, ND/NT, Neuro- no gross focal neurologic deficits Psych- alert and oriented x 3.    Data Reviewed: CBC: Recent Labs  Lab 04/01/18 1629 04/03/18 0614  WBC 9.6 9.3  NEUTROABS 7.4 6.9  HGB 12.5 11.3*  HCT 37.3 34.7*  MCV 85.2 88.7  PLT 413* 161   Basic Metabolic Panel: Recent Labs  Lab 04/01/18 1629 04/02/18 0558 04/03/18 0614  NA 126* 130* 131*  K 4.1 4.1 4.2  CL 93* 99 99  CO2 24 25 25   GLUCOSE 108* 97 105*  BUN 16 16 12   CREATININE 0.82 0.77 0.62  CALCIUM 9.5 9.3 8.9  MG  --   --  2.0  PHOS  --   --  2.2*   GFR: Estimated Creatinine Clearance: 55.5 mL/min (by C-G formula based on SCr of 0.62 mg/dL). Liver Function  Tests: Recent Labs  Lab 04/01/18 1629 04/03/18 0614  AST 28 29  ALT 16 23  ALKPHOS 193* 177*  BILITOT 0.4 0.3  PROT 6.5 6.1*  ALBUMIN 3.3* 3.0*   No results for input(s): LIPASE, AMYLASE in the last 168 hours. No results for input(s): AMMONIA in the last 168 hours. Coagulation Profile: No results for input(s): INR, PROTIME in the last 168 hours. Cardiac Enzymes: No results for input(s): CKTOTAL, CKMB, CKMBINDEX, TROPONINI in the last 168 hours. BNP (last 3 results) No results for input(s): PROBNP in the last 8760 hours. HbA1C: No results for input(s): HGBA1C in the last 72 hours. CBG: No results for input(s): GLUCAP in the last 168 hours. Lipid Profile: No results for input(s): CHOL, HDL, LDLCALC, TRIG, CHOLHDL, LDLDIRECT in the last 72 hours. Thyroid Function Tests: No results for input(s): TSH, T4TOTAL, FREET4, T3FREE, THYROIDAB in the last 72 hours. Anemia Panel: No results for input(s): VITAMINB12, FOLATE, FERRITIN, TIBC, IRON, RETICCTPCT in the last 72 hours. Urine analysis:    Component Value Date/Time   COLORURINE YELLOW 04/01/2018 1917   APPEARANCEUR HAZY (A) 04/01/2018 1917   LABSPEC 1.006 04/01/2018 1917   PHURINE 6.0 04/01/2018 [place] NEGATIVE 04/01/2018 1917   GLUCOSEU NEGATIVE 01/28/2018 1055   HGBUR MODERATE (A) 04/01/2018 [place] NEGATIVE 04/01/2018 1917   BILIRUBINUR n 07/16/2015 0918   KETONESUR NEGATIVE 04/01/2018 [place] NEGATIVE 04/01/2018 1917   UROBILINOGEN 0.2 01/28/2018 1055   NITRITE POSITIVE (A) 04/01/2018 1917   LEUKOCYTESUR LARGE (A) 04/01/2018 1917   Sepsis Labs: @LABRCNTIP (procalcitonin:4,lacticidven:4)  ) Recent Results (from the past 240 hour(s))  Culture, Urine     Status: Abnormal (Preliminary result)   Collection Time: 04/01/18  7:17 PM  Result Value Ref Range Status   Specimen Description   Final    URINE, CLEAN CATCH Performed at [facility], [place] [address],  [place], [place] [address]    Special Requests   Final    NONE Performed at [facility], [place] [address] [place], [place] [address]    Culture (A)  Final    >=100,000 COLONIES/mL PSEUDOMONAS AERUGINOSA SUSCEPTIBILITIES TO FOLLOW Performed at [facility], [place] [address] [place], [place] [address]    Report Status PENDING  Incomplete  MRSA PCR Screening     Status: None   Collection Time: 04/02/18  6:04 AM  Result Value Ref Range Status   MRSA by PCR NEGATIVE NEGATIVE Final    Comment:        The GeneXpert MRSA Assay (FDA approved for NASAL specimens only), is one component of a comprehensive MRSA colonization surveillance program. It is not intended to diagnose MRSA infection nor to guide or monitor treatment for MRSA infections. Performed at [facility], [place] [address] [place], [place]  [address]       Studies: No results found.  Scheduled Meds: . enoxaparin (LOVENOX) injection  40 mg Subcutaneous Q24H  . levothyroxine  75 mcg Oral QAC breakfast  . pantoprazole  40 mg Oral Daily  . polyethylene glycol  17 g Oral Daily  . senna  1 tablet Oral BID    Continuous Infusions: . sodium chloride 100 mL/hr at 04/03/18 0418  . piperacillin-tazobactam (ZOSYN)  IV 3.375 g (04/03/18 1023)     LOS: 1 day     Desiree Hane, MD [facility] Pager [phone]  If 7PM-7AM, please contact night-coverage [email or web address] Password [email or web address] 04/03/2018, 11:17 AM

## 2018-04-03 NOTE — Progress Notes (Addendum)
Pharmacy Antibiotic Note  [name] [name] is a 76 y.o. female admitted on 04/01/2018 with UTI.  Pharmacy has been consulted for Zosyn dosing.  Plan: Zosyn 3.375g IV q8h (4 hour infusion).         Height: 5\' 7"  (170.2 cm) Weight: 146 lb 9.7 oz (66.5 kg) IBW/kg (Calculated) : 61.6  Temp (24hrs), Avg:98.1 F (36.7 C), Min:97.9 F (36.6 C), Max:98.5 F (36.9 C)  Recent Labs  Lab 04/01/18 1629 04/02/18 0558 04/03/18 0614  WBC 9.6  --  9.3  CREATININE 0.82 0.77 0.62    Estimated Creatinine Clearance: 55.5 mL/min (by C-G formula based on SCr of 0.62 mg/dL).    Allergies  Allergen Reactions  . Atorvastatin     Intolerant to all statins   . Cyclobenzaprine Other (See Comments)    Bradycardia, hypotension  . Ezetimibe      INTOL to Zetia  . Rosuvastatin     intolerant to all statins  . Gadolinium Derivatives Nausea Only    Pt became very nauseous after gadolinium administration//lh  . Nicoderm [Nicotine] Rash    Unable to use the patches---caused severe rash to area placed    Antimicrobials this admission: Ceftriaxone  10/14 >> 10/15 Zosyn 10/16 >>   Dose adjustments this admission:   Microbiology results: 10/14 UCx: Pseudomonas   10/15 MRSA PCR: negative  Thank you for allowing pharmacy to be a part of this patient's care.   Royetta Asal, PharmD, BCPS Pager [phone] 04/03/2018 12:56 PM

## 2018-04-03 NOTE — Progress Notes (Signed)
Physical Therapy Treatment Patient Details Name: [name] [name] MRN: [id number] DOB: 05-17-1941 Today's Date: 04/03/2018    History of Present Illness 77 yo female admitted from SNF with hyponatremia, UTI, weakness. Recent hx of R pubic ramus fx, sacral fx, R L5 transverse process fx. Hx of COPD, hypothyroidism, chronic back pain.     PT Comments    Progressing with mobility. Recommend daily OOB/ambulation with nursing assistance, in addition to PT, to increase activity.   Follow Up Recommendations  Home health PT;Supervision/Assistance - 24 hour;Home Health Aide (pt refusing SNF)     Equipment Recommendations  None recommended by PT    Recommendations for Other Services       Precautions / Restrictions Precautions Precautions: Fall Restrictions Weight Bearing Restrictions: No Other Position/Activity Restrictions: WBAT    Mobility  Bed Mobility Overal bed mobility: Needs Assistance Bed Mobility: Supine to Sit;Sit to Supine     Supine to sit: Min guard;HOB elevated Sit to supine: Min guard;HOB elevated   General bed mobility comments: close guard for safety.   Transfers Overall transfer level: Needs assistance Equipment used: Rolling walker (2 wheeled) Transfers: Sit to/from Stand Sit to Stand: Min assist         General transfer comment: x2. VCs safety, technique, hand placement. Assist to rise, stabilize, control descent.   Ambulation/Gait Ambulation/Gait assistance: Min guard;Min assist Gait Distance (Feet): 125 Feet Assistive device: Rolling walker (2 wheeled) Gait Pattern/deviations: Step-through pattern;Decreased stride length;Trunk flexed     General Gait Details: Intermittent assist to steady but mostly Min guard. Pt c/o low back pain during ambulation. She tolerated distance fairly well.    Stairs             Wheelchair Mobility    Modified Rankin (Stroke Patients Only)       Balance Overall balance assessment: Needs assistance         Standing balance support: Bilateral upper extremity supported Standing balance-Leahy Scale: Poor                              Cognition Arousal/Alertness: Awake/alert Behavior During Therapy: WFL for tasks assessed/performed Overall Cognitive Status: Within Functional Limits for tasks assessed                                        Exercises      General Comments        Pertinent Vitals/Pain Pain Assessment: 0-10 Pain Score: 6  Pain Location: low back Pain Descriptors / Indicators: Discomfort;Sore;Aching Pain Intervention(s): Limited activity within patient's tolerance;Repositioned;Monitored during session    Home Living                      Prior Function            PT Goals (current goals can now be found in the care plan section) Progress towards PT goals: Progressing toward goals    Frequency    Min 3X/week      PT Plan Current plan remains appropriate    Co-evaluation              AM-PAC PT "6 Clicks" Daily Activity  Outcome Measure  Difficulty turning over in bed (including adjusting bedclothes, sheets and blankets)?: A Little Difficulty moving from lying on back to sitting on the side of the bed? : A  Little Difficulty sitting down on and standing up from a chair with arms (e.g., wheelchair, bedside commode, etc,.)?: Unable Help needed moving to and from a bed to chair (including a wheelchair)?: A Little Help needed walking in hospital room?: A Little Help needed climbing 3-5 steps with a railing? : A Lot 6 Click Score: 15    End of Session Equipment Utilized During Treatment: Gait belt Activity Tolerance: Patient limited by pain Patient left: in bed;with call bell/phone within reach;with bed alarm set;with family/visitor present   PT Visit Diagnosis: Muscle weakness (generalized) (M62.81);Pain;Unsteadiness on feet (R26.81) Pain - part of body: (back)     Time: 1400-1447 PT Time Calculation  (min) (ACUTE ONLY): 47 min  Charges:  $Gait Training: 23-37 mins $Therapeutic Activity: 8-22 mins                        Weston Anna, PT Acute Rehabilitation Services Pager: [phone] Office: [phone]

## 2018-04-04 LAB — BASIC METABOLIC PANEL
Anion gap: 10 (ref 5–15)
BUN: 14 mg/dL (ref 8–23)
CHLORIDE: 98 mmol/L (ref 98–111)
CO2: 25 mmol/L (ref 22–32)
Calcium: 9.7 mg/dL (ref 8.9–10.3)
Creatinine, Ser: 0.8 mg/dL (ref 0.44–1.00)
Glucose, Bld: 102 mg/dL — ABNORMAL HIGH (ref 70–99)
Potassium: 4.3 mmol/L (ref 3.5–5.1)
SODIUM: 133 mmol/L — AB (ref 135–145)

## 2018-04-04 MED ORDER — PIPERACILLIN-TAZOBACTAM 3.375 G IVPB
3.3750 g | Freq: Three times a day (TID) | INTRAVENOUS | Status: DC
  Administered 2018-04-04 – 2018-04-05 (×2): 3.375 g via INTRAVENOUS
  Filled 2018-04-04 (×3): qty 50

## 2018-04-04 MED ORDER — POLYVINYL ALCOHOL 1.4 % OP SOLN
1.0000 [drp] | Freq: Three times a day (TID) | OPHTHALMIC | Status: DC | PRN
  Filled 2018-04-04: qty 15

## 2018-04-04 MED ORDER — CEPHALEXIN 500 MG PO CAPS: 500.0000 mg | ORAL_CAPSULE | Freq: Two times a day (BID) | ORAL | Status: DC

## 2018-04-04 NOTE — Evaluation (Addendum)
Occupational Therapy Evaluation Patient Details Name: [name] [name] MRN: [id number] DOB: January 19, 1941 Today's Date: 04/04/2018    History of Present Illness 77 yo female admitted from SNF with hyponatremia, UTI, weakness. Recent hx of R pubic ramus fx, sacral fx, R L5 transverse process fx. Hx of COPD, hypothyroidism, chronic back pain.    Clinical Impression   Pt does fatigue after toileting and grooming task this am and reports 6/10 pain in her back. She states she does not want to return to same SNF but is open to other SNF option and would like to explore these options. Feel pt would benefit from continued OT to progress safety and independence with self care tasks. Feel pt could benefit from SNF but if she decides to return home, recommend [facility] and an aide.     Follow Up Recommendations  SNF;Supervision/Assistance - 24 hour (if pt returns home, recommend HHOT and aide)   Equipment Recommendations  3 in 1 bedside commode(if home)    Recommendations for Other Services       Precautions / Restrictions Precautions Precautions: Fall Restrictions Weight Bearing Restrictions: No Other Position/Activity Restrictions: WBAT      Mobility Bed Mobility Overal bed mobility: Needs Assistance Bed Mobility: Supine to Sit;Sit to Supine     Supine to sit: Min guard;HOB elevated Sit to supine: Min guard;HOB elevated   General bed mobility comments: min guard for safety.  Transfers Overall transfer level: Needs assistance Equipment used: Rolling walker (2 wheeled) Transfers: Sit to/from Stand Sit to Stand: Min assist         General transfer comment: cues for hand placement and min assist to steady as she stood.    Balance                                           ADL either performed or assessed with clinical judgement   ADL Overall ADL's : Needs assistance/impaired Eating/Feeding: Independent;Bed level   Grooming: Wash/dry hands;Oral care;Min  guard;Standing   Upper Body Bathing: Set up;Sitting   Lower Body Bathing: Minimal assistance;Sit to/from stand   Upper Body Dressing : Sitting;Minimal assistance   Lower Body Dressing: Moderate assistance;Sit to/from stand   Toilet Transfer: Minimal assistance;Ambulation;BSC;RW   Toileting- Water quality scientist and Hygiene: Min guard;Sit to/from stand         General ADL Comments: Pt stating she was somewhat fatigued after toileting and grooming tasks. Pt stood to brush her teeth and wash her hands for approximately 2 minutes at the sink using sink edge for support with min guard assist for balance. Pt needs cues to stay closer inside walker and to turn walker as she backs up to surface to sit. She also needs mod cues for safety with hand placement for functional transfers. Husband present for session.     Vision Patient Visual Report: No change from baseline       Perception     Praxis      Pertinent Vitals/Pain Pain Assessment: 0-10 Pain Score: 6  Pain Location: all throughout her back. Pain Descriptors / Indicators: Aching Pain Intervention(s): Patient requesting pain meds-RN notified;Monitored during session     Hand Dominance     Extremity/Trunk Assessment Upper Extremity Assessment Upper Extremity Assessment: Overall WFL for tasks assessed           Communication Communication Communication: No difficulties   Cognition Arousal/Alertness: Awake/alert Behavior During  Therapy: WFL for tasks assessed/performed Overall Cognitive Status: Within Functional Limits for tasks assessed                                     General Comments       Exercises     Shoulder Instructions      Home Living Family/patient expects to be discharged to:: Unsure Living Arrangements: Spouse/significant other Available Help at Discharge: Family;Available 24 hours/day Type of Home: House   Entrance Stairs-Number of Steps: 1    Home Layout: One level                Home Equipment: Walker - 2 wheels;Cane - single point   Additional Comments: pt has limited help with her husband due to his extended age      Prior Functioning/Environment Level of Independence: Needs assistance  Gait / Transfers Assistance Needed: was working with PT at [facility] ADL's / Homemaking Assistance Needed: was not receiving OT at SNF. Pt states before a month ago she was managing her ADL on her own.            OT Problem List: Decreased strength;Decreased activity tolerance      OT Treatment/Interventions: Self-care/ADL training;DME and/or AE instruction;Therapeutic activities;Patient/family education    OT Goals(Current goals can be found in the care plan section) Acute Rehab OT Goals Patient Stated Goal: home OT Goal Formulation: With patient/family Time For Goal Achievement: 04/18/18 Potential to Achieve Goals: Good  OT Frequency: Min 2X/week   Barriers to D/C:            Co-evaluation              AM-PAC PT "6 Clicks" Daily Activity     Outcome Measure Help from another person eating meals?: None Help from another person taking care of personal grooming?: A Little Help from another person toileting, which includes using toliet, bedpan, or urinal?: A Little Help from another person bathing (including washing, rinsing, drying)?: A Little Help from another person to put on and taking off regular upper body clothing?: A Little Help from another person to put on and taking off regular lower body clothing?: A Little 6 Click Score: 19   End of Session Equipment Utilized During Treatment: Rolling walker;Gait belt  Activity Tolerance: Patient limited by fatigue Patient left: in bed;with call bell/phone within reach;with family/visitor present  OT Visit Diagnosis: Unsteadiness on feet (R26.81);Muscle weakness (generalized) (M62.81)                Time: 0950-1020 OT Time Calculation (min): 30 min Charges:  OT General Charges $OT Visit: 1  Visit OT Evaluation $OT Eval Moderate Complexity: 1 Mod OT Treatments $Therapeutic Activity: 8-22 mins   Jae Dire Danyon Mcginness OTR/L Acute Rehab [phone] 04/04/2018, 11:18 AM

## 2018-04-04 NOTE — Progress Notes (Signed)
Physical Therapy Treatment Patient Details Name: [name] [name] MRN: [id number] DOB: 12-03-1940 Today's Date: 04/04/2018    History of Present Illness 77 yo female admitted from SNF with hyponatremia, UTI, weakness. Recent hx of R pubic ramus fx, sacral fx, R L5 transverse process fx. Hx of COPD, hypothyroidism, chronic back pain.     PT Comments    Progressing with mobility. Pt seems to only get OOB for PT sessions. Highly recommend OOB, at least to/from bathroom, with nursing assistance. Pt and spouse seem undecided about d/c plan. If pt returns home, she will need to be set up with as much home care as possible. Husband is only able to provide limited assistance. Will continue to follow.     Follow Up Recommendations  Home health PT;Supervision/Assistance - 24 hour(Home Health Aide)     Equipment Recommendations  None recommended by PT    Recommendations for Other Services       Precautions / Restrictions Precautions Precautions: Fall Restrictions Weight Bearing Restrictions: No Other Position/Activity Restrictions: WBAT    Mobility  Bed Mobility Overal bed mobility: Needs Assistance Bed Mobility: Supine to Sit;Sit to Supine     Supine to sit: Supervision;HOB elevated Sit to supine: Supervision;HOB elevated   General bed mobility comments: for safety.  Transfers Overall transfer level: Needs assistance Equipment used: Rolling walker (2 wheeled) Transfers: Sit to/from Stand Sit to Stand: Min guard         General transfer comment: close guard for safety. VCs safety, hand placement.   Ambulation/Gait Ambulation/Gait assistance: Min guard Gait Distance (Feet): 135 Feet Assistive device: Rolling walker (2 wheeled) Gait Pattern/deviations: Step-through pattern;Decreased stride length     General Gait Details: close guard for safety. Pt c/o low back pain. She tolerated distance fairly well. Will possibly have to keep distances on the shorter side due to  pain   Stairs             Wheelchair Mobility    Modified Rankin (Stroke Patients Only)       Balance Overall balance assessment: Needs assistance;History of Falls         Standing balance support: Bilateral upper extremity supported Standing balance-Leahy Scale: Poor Standing balance comment: requires RW for safety                            Cognition Arousal/Alertness: Awake/alert Behavior During Therapy: WFL for tasks assessed/performed Overall Cognitive Status: Within Functional Limits for tasks assessed                                        Exercises      General Comments        Pertinent Vitals/Pain Pain Assessment: 0-10 Pain Score: 7  Pain Location: back Pain Descriptors / Indicators: Aching;Discomfort Pain Intervention(s): Monitored during session;Repositioned    Home Living                      Prior Function            PT Goals (current goals can now be found in the care plan section) Progress towards PT goals: Progressing toward goals    Frequency    Min 3X/week      PT Plan Current plan remains appropriate    Co-evaluation  AM-PAC PT "6 Clicks" Daily Activity  Outcome Measure  Difficulty turning over in bed (including adjusting bedclothes, sheets and blankets)?: A Little Difficulty moving from lying on back to sitting on the side of the bed? : A Little Difficulty sitting down on and standing up from a chair with arms (e.g., wheelchair, bedside commode, etc,.)?: A Little Help needed moving to and from a bed to chair (including a wheelchair)?: A Little Help needed walking in hospital room?: A Little Help needed climbing 3-5 steps with a railing? : A Lot 6 Click Score: 17    End of Session Equipment Utilized During Treatment: Gait belt Activity Tolerance: Patient tolerated treatment well Patient left: in bed;with call bell/phone within reach;with bed alarm set;with  family/visitor present   PT Visit Diagnosis: Muscle weakness (generalized) (M62.81);Pain;Unsteadiness on feet (R26.81) Pain - part of body: (back)     Time: 3888-7579 PT Time Calculation (min) (ACUTE ONLY): 25 min  Charges:  $Gait Training: 8-22 mins $Therapeutic Activity: 8-22 mins                        Weston Anna, PT Acute Rehabilitation Services Pager: [phone] Office: [phone]

## 2018-04-04 NOTE — Progress Notes (Signed)
PROGRESS NOTE  [name] [name] MVE:[id number] DOB: 03/08/1941 DOA: 04/01/2018 PCP: Marin Olp, MD  HPI/Brief Narrative  [name] [name] is a 77 y.o. year old female with medical history significant for COPD, chronic low back pain, pubic ramus fracture, recent admission and discharge on 03/26/2018 for acute hyponatremia treated with isotonic saline and p.o. fluid restriction.  Patient presented on 04/01/2018 after abnormal outpatient sodium value of 123 (on dischargeSodium was 132) Subjective 2 BMs yesterday, complains of dry eyes,   Assessment/Plan:  #Acute on chronic hyponatremia, continues to improve.  Currently 133 (previously 131 on 10/16), nadir of 120 through during admission.  Have since discontinue IV fluids on 10/16.  Will continue fluid restriction as presumed patient's presenting symptoms related to combined etiology of low solute diet and increase water intake.  He also has some relation to undertreated hypothyroidism from previous admission.   #Pseudomonal UTI. Switched from IV ceftriaxone to IV zosyn on 10/16, but cultures are reincubating (now currently saying GNR), pending urine culture sensitivities, hopeful to result on 10/18.   #Constipation, resolved.  Related to opiate regimen.  Improved with optimization of bowel regimen.  Continue to monitor.  #Hypothyroidism.  Last TSH on admission elevated at 6.213.  Continue current Synthroid, will need follow-up as outpatient  #COPD, stable.  No wheezing on exam on room air.  #Chronic deconditioning, exacerbated by subacute pubic ramus fracture.  Patient discharged to SNF on last admission but currently declining, PT recommends 24 H assistance and [place] aide.   Code Status: Full code  Family Communication: Family updated bedside  Disposition Plan: Repeat BMP in a.m., pending urine culture sensitivities.   Consultants:  None     Procedures:  None  Antimicrobials: Anti-infectives (From admission, onward)   Start     Dose/Rate Route Frequency Ordered Stop   04/03/18 1000  piperacillin-tazobactam (ZOSYN) IVPB 3.375 g     3.375 g 12.5 mL/hr over 240 Minutes Intravenous Every 8 hours 04/03/18 0815     04/02/18 2200  cefTRIAXone (ROCEPHIN) 1 g in sodium chloride 0.9 % 100 mL IVPB  Status:  Discontinued     1 g 200 mL/hr over 30 Minutes Intravenous Every 24 hours 04/01/18 2002 04/03/18 0802   04/01/18 2000  cefTRIAXone (ROCEPHIN) 1 g in sodium chloride 0.9 % 100 mL IVPB     1 g 200 mL/hr over 30 Minutes Intravenous  Once 04/01/18 1950 04/01/18 2101        Cultures:  04/01/2018, urine culture with Pseudomonas, pending sensitivities  Telemetry: No  DVT prophylaxis: Lovenox   Objective: Vitals:   04/03/18 0550 04/03/18 1511 04/03/18 2026 04/04/18 0437  BP: 126/78 128/74 113/75 139/75  Pulse: 69 73 73 68  Resp: 17 16 18 19   Temp: 98.5 F (36.9 C) 97.7 F (36.5 C) 97.9 F (36.6 C) 97.9 F (36.6 C)  TempSrc: Oral Oral Oral   SpO2: 96% 95% 96% 96%  Weight:      Height:        Intake/Output Summary (Last 24 hours) at 04/04/2018 1151 Last data filed at 04/04/2018 1006 Gross per 24 hour  Intake 2586.61 ml  Output -  Net 2586.61 ml   Filed Weights   04/01/18 1506  Weight: 66.5 kg    Exam:  Gen- alert, no distress Eyes- EOMI, anicteric ENMT- moist oral mucosa CV-RRR, no murmurs, no edema Abdomen- soft, ND/NT, normal bowel sounds Neuro- no gross focal neurologic deficits Psych- alert and oriented x 3.    Data  Reviewed: CBC: Recent Labs  Lab 04/01/18 1629 04/03/18 0614  WBC 9.6 9.3  NEUTROABS 7.4 6.9  HGB 12.5 11.3*  HCT 37.3 34.7*  MCV 85.2 88.7  PLT 413* 371   Basic Metabolic Panel: Recent Labs  Lab 04/01/18 1629 04/02/18 0558 04/03/18 0614 04/04/18 0601  NA 126* 130* 131* 133*  K 4.1 4.1 4.2 4.3  CL 93* 99 99 98  CO2 24 25 25 25   GLUCOSE 108* 97 105* 102*  BUN 16 16 12 14   CREATININE 0.82 0.77 0.62 0.80  CALCIUM 9.5 9.3 8.9 9.7  MG  --   --   2.0  --   PHOS  --   --  2.2*  --    GFR: Estimated Creatinine Clearance: 55.5 mL/min (by C-G formula based on SCr of 0.8 mg/dL). Liver Function Tests: Recent Labs  Lab 04/01/18 1629 04/03/18 0614  AST 28 29  ALT 16 23  ALKPHOS 193* 177*  BILITOT 0.4 0.3  PROT 6.5 6.1*  ALBUMIN 3.3* 3.0*   No results for input(s): LIPASE, AMYLASE in the last 168 hours. No results for input(s): AMMONIA in the last 168 hours. Coagulation Profile: No results for input(s): INR, PROTIME in the last 168 hours. Cardiac Enzymes: No results for input(s): CKTOTAL, CKMB, CKMBINDEX, TROPONINI in the last 168 hours. BNP (last 3 results) No results for input(s): PROBNP in the last 8760 hours. HbA1C: No results for input(s): HGBA1C in the last 72 hours. CBG: No results for input(s): GLUCAP in the last 168 hours. Lipid Profile: No results for input(s): CHOL, HDL, LDLCALC, TRIG, CHOLHDL, LDLDIRECT in the last 72 hours. Thyroid Function Tests: No results for input(s): TSH, T4TOTAL, FREET4, T3FREE, THYROIDAB in the last 72 hours. Anemia Panel: No results for input(s): VITAMINB12, FOLATE, FERRITIN, TIBC, IRON, RETICCTPCT in the last 72 hours. Urine analysis:    Component Value Date/Time   COLORURINE YELLOW 04/01/2018 1917   APPEARANCEUR HAZY (A) 04/01/2018 1917   LABSPEC 1.006 04/01/2018 1917   PHURINE 6.0 04/01/2018 [place] NEGATIVE 04/01/2018 1917   GLUCOSEU NEGATIVE 01/28/2018 1055   HGBUR MODERATE (A) 04/01/2018 [place] NEGATIVE 04/01/2018 1917   BILIRUBINUR n 07/16/2015 0918   KETONESUR NEGATIVE 04/01/2018 [place] NEGATIVE 04/01/2018 1917   UROBILINOGEN 0.2 01/28/2018 1055   NITRITE POSITIVE (A) 04/01/2018 1917   LEUKOCYTESUR LARGE (A) 04/01/2018 1917   Sepsis Labs: @LABRCNTIP (procalcitonin:4,lacticidven:4)  ) Recent Results (from the past 240 hour(s))  Culture, Urine     Status: Abnormal (Preliminary result)   Collection Time: 04/01/18  7:17 PM  Result Value  Ref Range Status   Specimen Description   Final    URINE, CLEAN CATCH Performed at [facility], [place] [address] [place], [place] [address]    Special Requests   Final    NONE Performed at [facility], [place] [address], [place], [place] [address]    Culture >=100,000 COLONIES/mL GRAM NEGATIVE RODS (A)  Final   Report Status PENDING  Incomplete  MRSA PCR Screening     Status: None   Collection Time: 04/02/18  6:04 AM  Result Value Ref Range Status   MRSA by PCR NEGATIVE NEGATIVE Final    Comment:        The GeneXpert MRSA Assay (FDA approved for NASAL specimens only), is one component of a comprehensive MRSA colonization surveillance program. It is not intended to diagnose MRSA infection nor to guide or monitor treatment for MRSA infections.  Performed at [facility], [place] [address] [place], [place] [address]       Studies: No results found.  Scheduled Meds: . enoxaparin (LOVENOX) injection  40 mg Subcutaneous Q24H  . levothyroxine  75 mcg Oral QAC breakfast  . pantoprazole  40 mg Oral Daily  . polyethylene glycol  17 g Oral Daily  . senna  1 tablet Oral BID    Continuous Infusions: . piperacillin-tazobactam (ZOSYN)  IV 3.375 g (04/04/18 0908)     LOS: 2 days     Desiree Hane, MD [facility] Pager [phone]  If 7PM-7AM, please contact night-coverage [email or web address] Password [email or web address] 04/04/2018, 11:51 AM

## 2018-04-05 DIAGNOSIS — R030 Elevated blood-pressure reading, without diagnosis of hypertension: Secondary | ICD-10-CM | POA: Diagnosis not present

## 2018-04-05 DIAGNOSIS — I10 Essential (primary) hypertension: Secondary | ICD-10-CM

## 2018-04-05 LAB — BASIC METABOLIC PANEL
Anion gap: 9 (ref 5–15)
Anion gap: 10 (ref 5–15)
BUN: 11 mg/dL (ref 8–23)
BUN: 13 mg/dL (ref 8–23)
CALCIUM: 9.1 mg/dL (ref 8.9–10.3)
CALCIUM: 8.9 mg/dL (ref 8.9–10.3)
CHLORIDE: 91 mmol/L — AB (ref 98–111)
CO2: 25 mmol/L (ref 22–32)
CO2: 23 mmol/L (ref 22–32)
CREATININE: 0.69 mg/dL (ref 0.44–1.00)
Chloride: 91 mmol/L — ABNORMAL LOW (ref 98–111)
Creatinine, Ser: 0.72 mg/dL (ref 0.44–1.00)
GFR calc non Af Amer: >60 mL/min (ref >60)
GFR calc non Af Amer: >60 mL/min (ref >60)
GLUCOSE: 124 mg/dL — AB (ref 70–99)
Glucose, Bld: 104 mg/dL — ABNORMAL HIGH (ref 70–99)
Potassium: 3.8 mmol/L (ref 3.5–5.1)
Potassium: 3.9 mmol/L (ref 3.5–5.1)
SODIUM: 125 mmol/L — AB (ref 135–145)
Sodium: 124 mmol/L — ABNORMAL LOW (ref 135–145)

## 2018-04-05 LAB — OSMOLALITY, URINE: Osmolality, Ur: 282 mOsm/kg — ABNORMAL LOW (ref 300–900)

## 2018-04-05 LAB — SODIUM, URINE, RANDOM: SODIUM UR: 97 mmol/L

## 2018-04-05 LAB — URIC ACID: Uric Acid, Serum: 2.5 mg/dL (ref 2.5–7.1)

## 2018-04-05 LAB — TSH: TSH: 6.991 u[IU]/mL — ABNORMAL HIGH (ref 0.350–4.500)

## 2018-04-05 LAB — OSMOLALITY: OSMOLALITY: 263 mosm/kg — AB (ref 275–295)

## 2018-04-05 LAB — CHLORIDE, URINE, RANDOM: Chloride Urine: 75 mmol/L

## 2018-04-05 MED ORDER — FUROSEMIDE 40 MG PO TABS
40.0000 mg | ORAL_TABLET | Freq: Two times a day (BID) | ORAL | Status: DC
  Administered 2018-04-06 – 2018-04-07 (×3): 40 mg via ORAL
  Filled 2018-04-05 (×3): qty 1

## 2018-04-05 MED ORDER — SODIUM CHLORIDE 0.9 % IV SOLN
1.0000 g | INTRAVENOUS | Status: DC
  Administered 2018-04-05: 1 g via INTRAVENOUS
  Filled 2018-04-05: qty 10
  Filled 2018-04-05: qty 1

## 2018-04-05 MED ORDER — FUROSEMIDE 40 MG PO TABS
80.0000 mg | ORAL_TABLET | Freq: Once | ORAL | Status: AC
  Administered 2018-04-05: 80 mg via ORAL
  Filled 2018-04-05: qty 2

## 2018-04-05 NOTE — Consult Note (Signed)
Reason for Consult:Hyponatremia  Referring Physician: Dr. Hazle Coca is an 77 y.o. female.  HPI: 77 yr female with hx COPD, ongoing smoking, Divetic dz, HOH, ^ lipids, hosp with pelvic fx, L5 fx and hyponatremia earlier in the month.  Low SNa thought due to meds, low intake, and hypothyroid. Level rose 127  To 132. Was low again on 10/14 at 125, but on 10/15 was 130.  Admitted here again on 10/14.  Serum Na rose to 133 on 10/17 then was 125 today, repeat 124. Denies any MS changes, change in fluid consumption but has poor appetite .  "Has to have a lot of fluid with pills to get down".  Has normal Cr at .69. Urine Na has varied 31 on 10/5 to 97 today.  U Osm  112 on 10/5.  HTn now and never has been.  Constitutional: weak, poor appetite, now improving Eyes: some decreased vision Ears, nose, mouth, throat, and face: HOH , sometime swallowing issues Respiratory: cough Cardiovascular: negative Gastrointestinal: constip with Narcotics, occ V, trouble swallowing Genitourinary:negative Integument/breast: negative Musculoskeletal:diffuse arth, esp back and pelvis Neurological: negative Endocrine: thyroid issues Allergic/Immunologic: cyclobenzaprine, Gd, statins, nicoderm patch, Zetia  Past Medical History:  Diagnosis Date  . Asymptomatic varicose veins   . Cataract    beginnings  . Cigarette smoker   . COPD (chronic obstructive pulmonary disease) ([place])   . Diverticulosis of colon (without mention of hemorrhage)   . Headache(784.0)   . Hearing loss    wears hearing aides  . Lumbar back pain   . Pure hypercholesterolemia   . Stress disorder, acute   . Varicose veins     Past Surgical History:  Procedure Laterality Date  . COLONOSCOPY  04-22-2004  . LUMBAR SPINE SURGERY     [phone]  . varicose vein treatment     at [facility]    Family History  Problem Relation Age of Onset  . Diabetes Father   . Heart attack Mother 51       nonsmoker  . Colon cancer Neg  Hx   . Rectal cancer Neg Hx   . Stomach cancer Neg Hx     Social History:  reports that she has been smoking cigarettes. She has a 20.00 pack-year smoking history. She has never used smokeless tobacco. She reports that she does not drink alcohol or use drugs.  Allergies:  Allergies  Allergen Reactions  . Atorvastatin     Intolerant to all statins   . Cyclobenzaprine Other (See Comments)    Bradycardia, hypotension  . Ezetimibe      INTOL to Zetia  . Rosuvastatin     intolerant to all statins  . Gadolinium Derivatives Nausea Only    Pt became very nauseous after gadolinium administration//lh  . Nicoderm [Nicotine] Rash    Unable to use the patches---caused severe rash to area placed    Medications:  I have reviewed the patient's current medications. Prior to Admission:  Medications Prior to Admission  Medication Sig Dispense Refill Last Dose  . HYDROcodone-acetaminophen (NORCO/VICODIN) 5-325 MG tablet Take 1-2 tablets by mouth every 6 (six) hours as needed for moderate pain or severe pain. 15 tablet 0 04/01/2018 at Unknown time  . hydrOXYzine (ATARAX/VISTARIL) 25 MG tablet Take 25 mg by mouth 3 (three) times daily as needed.   prn  . omeprazole (PRILOSEC) 20 MG capsule Take 1 capsule (20 mg total) by mouth daily. 30 capsule 3 04/01/2018 at Unknown time  .  polyethylene glycol (MIRALAX / GLYCOLAX) packet Take 17 g by mouth daily. 14 each 0 04/01/2018 at Unknown time  . senna (SENOKOT) 8.6 MG TABS tablet Take 1 tablet (8.6 mg total) by mouth 2 (two) times daily. 120 each 0 04/01/2018 at Unknown time  . nicotine polacrilex (NICORETTE) 2 MG gum Take 1 each (2 mg total) by mouth as needed for smoking cessation. 100 tablet 0 prn  . ondansetron (ZOFRAN-ODT) 4 MG disintegrating tablet Take 1 tablet (4 mg total) by mouth every 8 (eight) hours as needed for nausea or vomiting. 20 tablet 0 prn    Results for orders placed or performed during the hospital encounter of 04/01/18 (from the past  48 hour(s))  Basic metabolic panel     Status: Abnormal   Collection Time: 04/04/18  6:01 AM  Result Value Ref Range   Sodium 133 (L) 135 - 145 mmol/L   Potassium 4.3 3.5 - 5.1 mmol/L   Chloride 98 98 - 111 mmol/L   CO2 25 22 - 32 mmol/L   Glucose, Bld 102 (H) 70 - 99 mg/dL   BUN 14 8 - 23 mg/dL   Creatinine, Ser 0.80 0.44 - 1.00 mg/dL   Calcium 9.7 8.9 - 10.3 mg/dL   GFR calc non Af Amer >60 >60 mL/min   GFR calc Af Amer >60 >60 mL/min    Comment: (NOTE) The eGFR has been calculated using the CKD EPI equation. This calculation has not been validated in all clinical situations. eGFR's persistently <60 mL/min signify possible Chronic Kidney Disease.    Anion gap 10 5 - 15    Comment: Performed at [facility], [place] [address], [place], [place] [address]  Basic metabolic panel     Status: Abnormal   Collection Time: 04/05/18  5:32 AM  Result Value Ref Range   Sodium 125 (L) 135 - 145 mmol/L    Comment: DELTA CHECK NOTED   Potassium 3.8 3.5 - 5.1 mmol/L   Chloride 91 (L) 98 - 111 mmol/L   CO2 25 22 - 32 mmol/L   Glucose, Bld 104 (H) 70 - 99 mg/dL   BUN 11 8 - 23 mg/dL   Creatinine, Ser 0.69 0.44 - 1.00 mg/dL   Calcium 9.1 8.9 - 10.3 mg/dL   GFR calc non Af Amer >60 >60 mL/min   GFR calc Af Amer >60 >60 mL/min    Comment: (NOTE) The eGFR has been calculated using the CKD EPI equation. This calculation has not been validated in all clinical situations. eGFR's persistently <60 mL/min signify possible Chronic Kidney Disease.    Anion gap 9 5 - 15    Comment: Performed at [facility], [place] [address] [place], [place] [address]  Osmolality     Status: Abnormal   Collection Time: 04/05/18  9:13 AM  Result Value Ref Range   Osmolality 263 (L) 275 - 295 mOsm/kg    Comment: Performed at [place] [address], [place], [place] [address]  Sodium, urine, random     Status: None   Collection Time: 04/05/18  9:48 AM  Result Value Ref  Range   Sodium, Ur 97 mmol/L    Comment: Performed at [facility], [place] [address] [place], [place] [address]  Chloride, urine, random     Status: None   Collection Time: 04/05/18  9:48 AM  Result Value Ref Range   Chloride Urine 75 mmol/L    Comment: Performed at [facility]  [place] [address], [place], [place] [address]  Basic metabolic panel     Status: Abnormal   Collection Time: 04/05/18  4:35 PM  Result Value Ref Range   Sodium 124 (L) 135 - 145 mmol/L   Potassium 3.9 3.5 - 5.1 mmol/L   Chloride 91 (L) 98 - 111 mmol/L   CO2 23 22 - 32 mmol/L   Glucose, Bld 124 (H) 70 - 99 mg/dL   BUN 13 8 - 23 mg/dL   Creatinine, Ser 0.72 0.44 - 1.00 mg/dL   Calcium 8.9 8.9 - 10.3 mg/dL   GFR calc non Af Amer >60 >60 mL/min   GFR calc Af Amer >60 >60 mL/min    Comment: (NOTE) The eGFR has been calculated using the CKD EPI equation. This calculation has not been validated in all clinical situations. eGFR's persistently <60 mL/min signify possible Chronic Kidney Disease.    Anion gap 10 5 - 15    Comment: Performed at [facility], [place] [address], [place], [place] [address]  Uric acid     Status: None   Collection Time: 04/05/18  4:35 PM  Result Value Ref Range   Uric Acid, Serum 2.5 2.5 - 7.1 mg/dL    Comment: Performed at [facility], [place] [address] [place], [place] [address]    No results found.  ROS Blood pressure (!) 150/68, pulse 74, temperature (!) 97.4 F (36.3 C), temperature source Oral, resp. rate 18, height '5\' 7"'  (1.702 m), weight 66.5 kg, SpO2 92 %. Physical Exam Physical Examination: General appearance - pale, NAD Mental status - alert, prob with focus, talkative Eyes - pupils equal and reactive, extraocular eye movements intact, funduscopic exam normal, discs flat and sharp Mouth - mucous membranes moist, pharynx normal without lesions Neck - adenopathy noted PCL Lymphatics - posterior  cervical nodes Chest - rales in bases Heart - S1 and S2 normal, S4 present, systolic murmur KM6/3 at apex Abdomen - soft, nontender, nondistended, no masses or organomegaly Extremities - pedal edema 1+ + Skin - pale, trophic changes in feet.  Assessment/Plan: 1 Hyponatremia  Mixed picture.  High UNa now c/w vol decrease and maybe pressure naturesis. Has some vol xs due to administerd fluids.  Low UOsm 2 wk ago argues against ^ADH but need repeat.  Good case for low solute load so cannot excrete enough free water (low UOsm, low alb).  Thyroid disease, and adrenal disease also can give this.  Has a lot of reasons for [facility] but no data to support that.  Also suspect much more fluid intake than she reports or is measured as evidenced by Hx and amt on tray.  Need to treat with Furosemide , fluid restriction for now and get data 2 Pelvic and L5 fx 3 Hypertension: new will diurest 4. Anemia mild 5. ^ lipids P Repeat Osms, urine chem, give Lasix, fluid restrict enforce  [name] Monchel Pollitt 04/05/2018, 7:31 PM

## 2018-04-05 NOTE — Care Management Important Message (Signed)
Important Message  Patient Details  Name: [name] [name] MRN: [id number] Date of Birth: July 10, 1940   Medicare Important Message Given:  Yes    Kerin Salen 04/05/2018, 11:02 AMImportant Message  Patient Details  Name: [name] [name] MRN: [id number] Date of Birth: [birth year]   Medicare Important Message Given:  Yes    Kerin Salen 04/05/2018, 11:02 AM

## 2018-04-05 NOTE — Plan of Care (Signed)

## 2018-04-05 NOTE — Progress Notes (Addendum)
PROGRESS NOTE  [name] [name] UEK:[id number] DOB: 07/24/1940 DOA: 04/01/2018 PCP: Marin Olp, MD  HPI/Brief Narrative  [name] [name] is a 77 y.o. year old female with medical history significant for COPD, chronic low back pain, pubic ramus fracture, recent admission and discharge on 03/26/2018 for acute hyponatremia treated with isotonic saline and p.o. fluid restriction.  Patient presented on 04/01/2018 after abnormal outpatient sodium value of 123 (on dischargeSodium was 132) Subjective Does not want hydroxyzine for anxiety anymore. Still urinating and having Bms daily  Assessment/Plan:  #Acute on chronic hypotonic hyponatremia, suspect multifactorial etiology, worsening and asymptomatic.  Sodium had normalized after discontinuation of IV fluids on 10/16 however on 10/18 sodium found to be 126( 133 the day prior).  Unclear etiology but have suspected related to low solute intake, increase fluids on (prior to admission), some elements of hypothyroidism( though not very severe), with consideration of SIADH as patient was previously fluid restricted on prior admission.  Patient is euvolemic on exam, eating well, and being fluid restricted. No obvious meds to blame -Will repeat BMP this evening - pending uric acid, urine osm, urine Cl - if sodium continues to be low will consult nephrology for further assistance currently  #E.Coli UTI. Empirically on IV ceftriaxone then switched to IV zosyn on 10/16 as culture resulted as Pseudomonas; however, culture reincubated and now results as E.coli so switched back to ceftriaxone on 10/18, still  pending urine culture sensitivities to hopefully deescalate to oral regimen.   #Hypertension, new, not controlled. Not on home BP meds. SBP as high as 170 but gone down to 150s. Does not want oral medications. IV hydralazine PRN. Discussed need to start oral regimen if this continues, she seems amenable to consider starting in next 24 hours if remains  uncontrolled  #Constipation, resolved.  Related to opiate regimen.  Improved with optimization of bowel regimen.  Continue to monitor.  #Hypothyroidism.  Last TSH on admission elevated at 6.213.  Continue current Synthroid, will need follow-up as outpatient  #COPD, stable.  No wheezing on exam on room air.  #Chronic deconditioning, exacerbated by subacute pubic ramus fracture.  Patient discharged to SNF on last admission but currently declining, PT recommends 24 H assistance and [facility] aide.   Code Status: Full code  Family Communication: Family updated bedside  Disposition Plan: Repeat BMP(if still low will consult nephrology on 04/05/18)., pending urine culture sensitivities.   Consultants:  None     Procedures:  None  Antimicrobials: Anti-infectives (From admission, onward)   Start     Dose/Rate Route Frequency Ordered Stop   04/05/18 1200  cefTRIAXone (ROCEPHIN) 1 g in sodium chloride 0.9 % 100 mL IVPB     1 g 200 mL/hr over 30 Minutes Intravenous Every 24 hours 04/05/18 1112     04/04/18 2200  cephALEXin (KEFLEX) capsule 500 mg  Status:  Discontinued     500 mg Oral Every 12 hours 04/04/18 1550 04/04/18 1550   04/04/18 2000  piperacillin-tazobactam (ZOSYN) IVPB 3.375 g  Status:  Discontinued     3.375 g 12.5 mL/hr over 240 Minutes Intravenous Every 8 hours 04/04/18 1551 04/05/18 1112   04/03/18 1000  piperacillin-tazobactam (ZOSYN) IVPB 3.375 g  Status:  Discontinued     3.375 g 12.5 mL/hr over 240 Minutes Intravenous Every 8 hours 04/03/18 0815 04/04/18 1550   04/02/18 2200  cefTRIAXone (ROCEPHIN) 1 g in sodium chloride 0.9 % 100 mL IVPB  Status:  Discontinued     1 g  200 mL/hr over 30 Minutes Intravenous Every 24 hours 04/01/18 2002 04/03/18 0802   04/01/18 2000  cefTRIAXone (ROCEPHIN) 1 g in sodium chloride 0.9 % 100 mL IVPB     1 g 200 mL/hr over 30 Minutes Intravenous  Once 04/01/18 1950 04/01/18 2101        Cultures:  04/01/2018, urine culture with  Pseudomonas, pending sensitivities  Telemetry: No  DVT prophylaxis: Lovenox   Objective: Vitals:   04/04/18 1421 04/04/18 2023 04/05/18 0422 04/05/18 0919  BP: 122/76 (!) 157/74 (!) 166/103 (!) 173/79  Pulse:  70 72 68  Resp:  15 19 14   Temp:  (!) 97.5 F (36.4 C) 97.8 F (36.6 C) 97.7 F (36.5 C)  TempSrc:  Oral  Oral  SpO2:  95% 96% 95%  Weight:      Height:        Intake/Output Summary (Last 24 hours) at 04/05/2018 1144 Last data filed at 04/05/2018 0800 Gross per 24 hour  Intake 355.86 ml  Output 1100 ml  Net -744.14 ml   Filed Weights   04/01/18 1506  Weight: 66.5 kg    Exam:  Gen- alert, no distress Eyes- EOMI, anicteric ENMT- moist oral mucosa CV-RRR, no murmurs, no peripheral edema Abdomen- soft, ND/NT, normal bowel sounds Neuro- no gross focal neurologic deficits Psych- alert and oriented x 4.    Data Reviewed: CBC: Recent Labs  Lab 04/01/18 1629 04/03/18 0614  WBC 9.6 9.3  NEUTROABS 7.4 6.9  HGB 12.5 11.3*  HCT 37.3 34.7*  MCV 85.2 88.7  PLT 413* 998   Basic Metabolic Panel: Recent Labs  Lab 04/01/18 1629 04/02/18 0558 04/03/18 0614 04/04/18 0601 04/05/18 0532  NA 126* 130* 131* 133* 125*  K 4.1 4.1 4.2 4.3 3.8  CL 93* 99 99 98 91*  CO2 24 25 25 25 25   GLUCOSE 108* 97 105* 102* 104*  BUN 16 16 12 14 11   CREATININE 0.82 0.77 0.62 0.80 0.69  CALCIUM 9.5 9.3 8.9 9.7 9.1  MG  --   --  2.0  --   --   PHOS  --   --  2.2*  --   --    GFR: Estimated Creatinine Clearance: 55.5 mL/min (by C-G formula based on SCr of 0.69 mg/dL). Liver Function Tests: Recent Labs  Lab 04/01/18 1629 04/03/18 0614  AST 28 29  ALT 16 23  ALKPHOS 193* 177*  BILITOT 0.4 0.3  PROT 6.5 6.1*  ALBUMIN 3.3* 3.0*   No results for input(s): LIPASE, AMYLASE in the last 168 hours. No results for input(s): AMMONIA in the last 168 hours. Coagulation Profile: No results for input(s): INR, PROTIME in the last 168 hours. Cardiac Enzymes: No results for  input(s): CKTOTAL, CKMB, CKMBINDEX, TROPONINI in the last 168 hours. BNP (last 3 results) No results for input(s): PROBNP in the last 8760 hours. HbA1C: No results for input(s): HGBA1C in the last 72 hours. CBG: No results for input(s): GLUCAP in the last 168 hours. Lipid Profile: No results for input(s): CHOL, HDL, LDLCALC, TRIG, CHOLHDL, LDLDIRECT in the last 72 hours. Thyroid Function Tests: No results for input(s): TSH, T4TOTAL, FREET4, T3FREE, THYROIDAB in the last 72 hours. Anemia Panel: No results for input(s): VITAMINB12, FOLATE, FERRITIN, TIBC, IRON, RETICCTPCT in the last 72 hours. Urine analysis:    Component Value Date/Time   COLORURINE YELLOW 04/01/2018 1917   APPEARANCEUR HAZY (A) 04/01/2018 1917   LABSPEC 1.006 04/01/2018 1917   PHURINE 6.0 04/01/2018 1917  GLUCOSEU NEGATIVE 04/01/2018 [place] NEGATIVE 01/28/2018 1055   HGBUR MODERATE (A) 04/01/2018 [place] 04/01/2018 1917   BILIRUBINUR n 07/16/2015 [place] 04/01/2018 [place] NEGATIVE 04/01/2018 1917   UROBILINOGEN 0.2 01/28/2018 1055   NITRITE POSITIVE (A) 04/01/2018 1917   LEUKOCYTESUR LARGE (A) 04/01/2018 1917   Sepsis Labs: @LABRCNTIP (procalcitonin:4,lacticidven:4)  ) Recent Results (from the past 240 hour(s))  Culture, Urine     Status: Abnormal (Preliminary result)   Collection Time: 04/01/18  7:17 PM  Result Value Ref Range Status   Specimen Description   Final    URINE, CLEAN CATCH Performed at [facility], [place] [address] [place], [place] [address]    Special Requests   Final    NONE Performed at [facility], [place] [address] [place], [place] [address]    Culture >=100,000 COLONIES/mL ESCHERICHIA COLI (A)  Final   Report Status PENDING  Incomplete  MRSA PCR Screening     Status: None   Collection Time: 04/02/18  6:04 AM  Result Value Ref Range Status   MRSA by PCR NEGATIVE NEGATIVE Final     Comment:        The GeneXpert MRSA Assay (FDA approved for NASAL specimens only), is one component of a comprehensive MRSA colonization surveillance program. It is not intended to diagnose MRSA infection nor to guide or monitor treatment for MRSA infections. Performed at [facility], [place] [address], [place], [place] [address]       Studies: No results found.  Scheduled Meds: . enoxaparin (LOVENOX) injection  40 mg Subcutaneous Q24H  . levothyroxine  75 mcg Oral QAC breakfast  . pantoprazole  40 mg Oral Daily  . polyethylene glycol  17 g Oral Daily  . senna  1 tablet Oral BID    Continuous Infusions: . cefTRIAXone (ROCEPHIN)  IV       LOS: 3 days     Desiree Hane, MD [facility] Pager [phone]  If 7PM-7AM, please contact night-coverage [email or web address] Password [email or web address] 04/05/2018, 11:44 AM

## 2018-04-06 ENCOUNTER — Inpatient Hospital Stay: Payer: Self-pay

## 2018-04-06 LAB — BASIC METABOLIC PANEL
Anion gap: 11 (ref 5–15)
BUN: 14 mg/dL (ref 8–23)
CHLORIDE: 90 mmol/L — AB (ref 98–111)
CO2: 25 mmol/L (ref 22–32)
Calcium: 9.2 mg/dL (ref 8.9–10.3)
Creatinine, Ser: 0.83 mg/dL (ref 0.44–1.00)
Glucose, Bld: 108 mg/dL — ABNORMAL HIGH (ref 70–99)
POTASSIUM: 4 mmol/L (ref 3.5–5.1)
SODIUM: 126 mmol/L — AB (ref 135–145)

## 2018-04-06 LAB — OSMOLALITY, URINE: Osmolality, Ur: 284 mOsm/kg — ABNORMAL LOW (ref 300–900)

## 2018-04-06 LAB — OSMOLALITY: OSMOLALITY: 260 mosm/kg — AB (ref 275–295)

## 2018-04-06 LAB — SODIUM, URINE, RANDOM: Sodium, Ur: 82 mmol/L

## 2018-04-06 LAB — CREATININE, URINE, RANDOM: Creatinine, Urine: 28.21 mg/dL

## 2018-04-06 LAB — CORTISOL: CORTISOL PLASMA: 7.5 ug/dL

## 2018-04-06 MED ORDER — SODIUM CHLORIDE 0.9 % IV SOLN
1.0000 g | Freq: Three times a day (TID) | INTRAVENOUS | Status: DC
  Administered 2018-04-06 – 2018-04-08 (×6): 1 g via INTRAVENOUS
  Filled 2018-04-06 (×8): qty 1

## 2018-04-06 NOTE — Progress Notes (Signed)
Spoke with Dr Jonnie Finner- states ok to place PICC from Nephrology standpoint.  Spoke with Dr Tawanna Solo re blood cultures.  States ok to place PICC even though no results.  States pt for d/c home 04-07-18, ok to place PICC in the am.

## 2018-04-06 NOTE — Progress Notes (Signed)
PROGRESS NOTE    [name] [name]  ZOX:[id number] DOB: 03/02/1941 DOA: 04/01/2018 PCP: Marin Olp, MD   Brief Narrative: Patient is a 77 year old female with past medical history of COPD, chronic low back pain, pubic fracture, recent admission and discharge on October 8 for acute hyponatremia treated with presented again with abnormal outpatient sodium level of 123.  Urine culture on this admission also showed ESBL E. Coli.  Nephrology also following.  Assessment & Plan:   Principal Problem:   Hyponatremia Active Problems:   Hypothyroidism   CIGARETTE SMOKER   Pubic ramus fracture (HCC)   Acute lower UTI   Constipation   Cystitis due to Pseudomonas   HTN (hypertension)  Acute on chronic  Hyponatremia: Nephrology following.  Most likely this is hypervolemic hyponatremia.  Urine and plasma osmolality both low.  Urine sodium 82.  Nephrology recommending IV Lasix.  Sodium has actually improved to 126 today. We will check BMP tomorrow.  ESBL E. coli UTI: Was on ceftriaxone.  Changed to meropenem today.  Most likely she will need a PICC line to continue the antibiotic course of 5 to 7 days so that she can complete the antibiotic course at home.  Currently she is hemodynamically stable, afebrile.  We will also check blood cultures.  Hypertension: Blood pressure stable today.  Will not start on antihypertensive for now.  Depression: Resolved  Hypothyroidism: TSH of 6.99 ,we will increase the Synthyroid dose  COPD: Currently stable  Chronic deconditioning/subacute pubic ramus fracture: Was discharged to skilled nursing facility on last admission .PT recommending 24-hour assistance and home health aid.     DVT prophylaxis: Lovenox Code Status: Full Family Communication: None present at the bed side Disposition Plan: Home with [place] after PICC line   Consultants: Nephrology  Procedures: None  Antimicrobials: Meropenem day 1  Subjective: Patient seen and examined the  bedside this morning.  Remains comfortable.  Hemodynamically stable.  No new complaints. Objective: Vitals:   04/05/18 0919 04/05/18 1408 04/06/18 0436 04/06/18 0806  BP: (!) 173/79 (!) 150/68 116/75   Pulse: 68 74 67   Resp: 14 18 (!) 22   Temp: 97.7 F (36.5 C) (!) 97.4 F (36.3 C) 98.1 F (36.7 C)   TempSrc: Oral Oral Oral   SpO2: 95% 92% 96% 95%  Weight:      Height:        Intake/Output Summary (Last 24 hours) at 04/06/2018 1326 Last data filed at 04/05/2018 1929 Gross per 24 hour  Intake 100 ml  Output 600 ml  Net -500 ml   Filed Weights   04/01/18 1506  Weight: 66.5 kg    Examination:  General exam: Appears calm and comfortable ,Not in distress,average built HEENT:PERRL,Oral mucosa moist, Ear/Nose normal on gross exam Respiratory system: Bilateral equal air entry, normal vesicular breath sounds, no wheezes or crackles  Cardiovascular system: S1 & S2 heard, RRR. No JVD, murmurs, rubs, gallops or clicks. No pedal edema. Gastrointestinal system: Abdomen is nondistended, soft and nontender. No organomegaly or masses felt. Normal bowel sounds heard. Central nervous system: Alert and oriented. No focal neurological deficits. Extremities: No edema, no clubbing ,no cyanosis, distal peripheral pulses palpable. Skin: No rashes, lesions or ulcers,no icterus ,no pallor MSK: Normal muscle bulk,tone ,power Psychiatry: Judgement and insight appear normal. Mood & affect appropriate.     Data Reviewed: I have personally reviewed following labs and imaging studies  CBC: Recent Labs  Lab 04/01/18 1629 04/03/18 0614  WBC 9.6 9.3  NEUTROABS  7.4 6.9  HGB 12.5 11.3*  HCT 37.3 34.7*  MCV 85.2 88.7  PLT 413* 765   Basic Metabolic Panel: Recent Labs  Lab 04/03/18 0614 04/04/18 0601 04/05/18 0532 04/05/18 1635 04/06/18 0554  NA 131* 133* 125* 124* 126*  K 4.2 4.3 3.8 3.9 4.0  CL 99 98 91* 91* 90*  CO2 25 25 25 23 25   GLUCOSE 105* 102* 104* 124* 108*  BUN 12 14 11 13  14   CREATININE 0.62 0.80 0.69 0.72 0.83  CALCIUM 8.9 9.7 9.1 8.9 9.2  MG 2.0  --   --   --   --   PHOS 2.2*  --   --   --   --    GFR: Estimated Creatinine Clearance: 53.4 mL/min (by C-G formula based on SCr of 0.83 mg/dL). Liver Function Tests: Recent Labs  Lab 04/01/18 1629 04/03/18 0614  AST 28 29  ALT 16 23  ALKPHOS 193* 177*  BILITOT 0.4 0.3  PROT 6.5 6.1*  ALBUMIN 3.3* 3.0*   No results for input(s): LIPASE, AMYLASE in the last 168 hours. No results for input(s): AMMONIA in the last 168 hours. Coagulation Profile: No results for input(s): INR, PROTIME in the last 168 hours. Cardiac Enzymes: No results for input(s): CKTOTAL, CKMB, CKMBINDEX, TROPONINI in the last 168 hours. BNP (last 3 results) No results for input(s): PROBNP in the last 8760 hours. HbA1C: No results for input(s): HGBA1C in the last 72 hours. CBG: No results for input(s): GLUCAP in the last 168 hours. Lipid Profile: No results for input(s): CHOL, HDL, LDLCALC, TRIG, CHOLHDL, LDLDIRECT in the last 72 hours. Thyroid Function Tests: Recent Labs    04/05/18 1937  TSH 6.991*   Anemia Panel: No results for input(s): VITAMINB12, FOLATE, FERRITIN, TIBC, IRON, RETICCTPCT in the last 72 hours. Sepsis Labs: No results for input(s): PROCALCITON, LATICACIDVEN in the last 168 hours.  Recent Results (from the past 240 hour(s))  Culture, Urine     Status: Abnormal (Preliminary result)   Collection Time: 04/01/18  7:17 PM  Result Value Ref Range Status   Specimen Description   Final    URINE, CLEAN CATCH Performed at [facility], [place] [address] [place], [name] [address]    Special Requests   Final    NONE Performed at [facility], [place] [address], [place], [place] [address]    Culture (A)  Final    >=100,000 COLONIES/mL ESCHERICHIA COLI Confirmed Extended Spectrum Beta-Lactamase Producer (ESBL).  In bloodstream infections from ESBL organisms, carbapenems are  preferred over piperacillin/tazobactam. They are shown to have a lower risk of mortality. CULTURE REINCUBATED FOR BETTER GROWTH Performed at [facility], [place] [address], [place], [place] [address]    Report Status PENDING  Incomplete   Organism ID, Bacteria ESCHERICHIA COLI (A)  Final      Susceptibility   Escherichia coli - MIC*    AMPICILLIN >=32 RESISTANT Resistant     CEFAZOLIN >=64 RESISTANT Resistant     CEFTRIAXONE >=64 RESISTANT Resistant     CIPROFLOXACIN 0.5 SENSITIVE Sensitive     GENTAMICIN >=16 RESISTANT Resistant     IMIPENEM <=0.25 SENSITIVE Sensitive     NITROFURANTOIN <=16 SENSITIVE Sensitive     TRIMETH/SULFA >=320 RESISTANT Resistant     AMPICILLIN/SULBACTAM 16 INTERMEDIATE Intermediate     PIP/TAZO <=4 SENSITIVE Sensitive     Extended ESBL POSITIVE Resistant     * >=100,000 COLONIES/mL ESCHERICHIA COLI  MRSA PCR Screening  Status: None   Collection Time: 04/02/18  6:04 AM  Result Value Ref Range Status   MRSA by PCR NEGATIVE NEGATIVE Final    Comment:        The GeneXpert MRSA Assay (FDA approved for NASAL specimens only), is one component of a comprehensive MRSA colonization surveillance program. It is not intended to diagnose MRSA infection nor to guide or monitor treatment for MRSA infections. Performed at [facility], [place] [address] [place], [place] [address]          Radiology Studies: No results found.      Scheduled Meds: . enoxaparin (LOVENOX) injection  40 mg Subcutaneous Q24H  . furosemide  40 mg Oral BID  . levothyroxine  75 mcg Oral QAC breakfast  . pantoprazole  40 mg Oral Daily  . polyethylene glycol  17 g Oral Daily  . senna  1 tablet Oral BID   Continuous Infusions: . meropenem (MERREM) IV       LOS: 4 days    Time spent:25 mins. More than 50% of that time was spent in counseling and/or coordination of care.      Shelly Coss, MD [facility] Pager [phone]  If  7PM-7AM, please contact night-coverage [email or web address] Password [email or web address] 04/06/2018, 1:26 PM

## 2018-04-06 NOTE — Progress Notes (Signed)
[name] [name] Progress Note  Subjective: Na+ ^126 today. UOP 1700 cc yest on 40 lasix po bid  Vitals:   04/05/18 1408 04/06/18 0436 04/06/18 0806 04/06/18 1430  BP: (!) 150/68 116/75  131/82  Pulse: 74 67  76  Resp: 18 (!) 22  20  Temp: (!) 97.4 F (36.3 C) 98.1 F (36.7 C)  97.8 F (36.6 C)  TempSrc: Oral Oral  Oral  SpO2: 92% 96% 95% 96%  Weight:      Height:        Inpatient medications: . enoxaparin (LOVENOX) injection  40 mg Subcutaneous Q24H  . furosemide  40 mg Oral BID  . levothyroxine  75 mcg Oral QAC breakfast  . pantoprazole  40 mg Oral Daily  . polyethylene glycol  17 g Oral Daily  . senna  1 tablet Oral BID   . meropenem (MERREM) IV 1 g (04/06/18 1458)   acetaminophen **OR** acetaminophen, HYDROcodone-acetaminophen, nicotine polacrilex, ondansetron **OR** ondansetron (ZOFRAN) IV, polyvinyl alcohol, sodium phosphate  Iron/TIBC/Ferritin/ %Sat No results found for: IRON, TIBC, FERRITIN, IRONPCTSAT  Exam: General appearance - pale, NAD Mental status - alert, prob with focus, talkative Eyes - pupils equal and reactive, extraocular eye movements intact, funduscopic exam normal, discs flat and sharp Mouth - mucous membranes moist, pharynx normal without lesions Neck - adenopathy noted PCL Lymphatics - posterior cervical nodes Chest - rales in bases Heart - S1 and S2 normal, S4 present, systolic murmur FU9/3 at apex Abdomen - soft, nontender, nondistended, no masses or organomegaly Extremities - pedal edema 1+ + Skin - pale, trophic changes in feet.      Impression: 1 Hyponatremia - mixed picture.  High UNa now c/w vol decrease and maybe pressure naturesis. Has some vol xs due to administerd fluids.  Low UOsm 2 wk ago argues against ^ADH but need repeat.  Good case for low solute load so cannot excrete enough free water (low UOsm, low alb). Has a lot of reasons for [facility] but no data to support that.  TSH and cortisol both borderline. Also suspect  much more fluid intake than she reports. Repeat urine Na and osm unchanged. Will continue w po furosemide + fluid restriction for now.  2 Pelvic and L5 fx 3 Hypertension: new will diurese 4. Anemia mild 5. ^ lipids   P as above    Kelly Splinter MD [place] pager [phone]   04/06/2018, 5:24 PM   Recent Labs  Lab 04/01/18 1629  04/03/18 0614  04/05/18 1635 04/06/18 0554  NA 126*   < > 131*   < > 124* 126*  K 4.1   < > 4.2   < > 3.9 4.0  CL 93*   < > 99   < > 91* 90*  CO2 24   < > 25   < > 23 25  GLUCOSE 108*   < > 105*   < > 124* 108*  BUN 16   < > 12   < > 13 14  CREATININE 0.82   < > 0.62   < > 0.72 0.83  CALCIUM 9.5   < > 8.9   < > 8.9 9.2  PHOS  --   --  2.2*  --   --   --   ALBUMIN 3.3*  --  3.0*  --   --   --    < > = values in this interval not displayed.   Recent Labs  Lab 04/01/18 1629  04/03/18 0614  AST 28 29  ALT 16 23  ALKPHOS 193* 177*  BILITOT 0.4 0.3  PROT 6.5 6.1*   Recent Labs  Lab 04/01/18 1629 04/03/18 0614  WBC 9.6 9.3  NEUTROABS 7.4 6.9  HGB 12.5 11.3*  HCT 37.3 34.7*  MCV 85.2 88.7  PLT 413* 382

## 2018-04-07 LAB — BASIC METABOLIC PANEL
ANION GAP: 8 (ref 5–15)
BUN: 19 mg/dL (ref 8–23)
CHLORIDE: 93 mmol/L — AB (ref 98–111)
CO2: 28 mmol/L (ref 22–32)
Calcium: 9.1 mg/dL (ref 8.9–10.3)
Creatinine, Ser: 0.84 mg/dL (ref 0.44–1.00)
GFR calc Af Amer: >60 mL/min (ref >60)
GFR calc non Af Amer: >60 mL/min (ref >60)
Glucose, Bld: 108 mg/dL — ABNORMAL HIGH (ref 70–99)
POTASSIUM: 3.6 mmol/L (ref 3.5–5.1)
Sodium: 129 mmol/L — ABNORMAL LOW (ref 135–145)

## 2018-04-07 LAB — URINE CULTURE

## 2018-04-07 LAB — CORTISOL: Cortisol, Plasma: 12.9 ug/dL

## 2018-04-07 MED ORDER — SODIUM CHLORIDE 0.9% FLUSH: 10.0000 mL | Freq: Two times a day (BID) | INTRAVENOUS | Status: DC

## 2018-04-07 MED ORDER — HEPARIN SOD (PORK) LOCK FLUSH 100 UNIT/ML IV SOLN
250.0000 [IU] | INTRAVENOUS | Status: AC | PRN
  Administered 2018-04-08: 250 [IU]

## 2018-04-07 MED ORDER — SODIUM CHLORIDE 0.9% FLUSH: 10.0000 mL | INTRAVENOUS | Status: DC | PRN

## 2018-04-07 MED ORDER — MEROPENEM IV (FOR PTA / DISCHARGE USE ONLY): 1.0000 g | Freq: Three times a day (TID) | INTRAVENOUS | 0 refills | Status: AC

## 2018-04-07 NOTE — Discharge Summary (Signed)
Physician Discharge Summary  [name] [name] BDZ:[id number] DOB: Oct 18, 1940 DOA: 04/01/2018  PCP: Marin Olp, MD  Admit date: 04/01/2018 Discharge date: 04/07/2018  Admitted From: Home Disposition:  Home  Discharge Condition:Stable CODE STATUS:FULL Diet recommendation:  Regular   Brief/Interim Summary: Patient is a 77 year old female with past medical history of COPD, chronic low back pain, pubic fracture, recent admission and discharge on October 8 for acute hyponatremia treated with presented again with abnormal outpatient sodium level of 123.  Urine culture on this admission also showed ESBL E. Coli.  Nephrology was also following.  Patient was started on water restriction and Lasix which improved her sodium level.  Today her sodium level is 129.  She underwent PICC line today.  Home health will follow her for antibiotic infusion at home.  Following problems were addressed during her hospitalization:  Acute on chronic  Hyponatremia: Nephrology was following.  Most likely this is hypervolemic hyponatremia.  Urine and plasma osmolality both low.  Urine sodium 82.   Patient was drinking a lot of fluids at home.She reported of increased urinary frequency that could have triggered increased water intake. Nephrology recommended lasix and water restriction which improve her sodium level.  Lasix has been discontinued on discharge.  She has been advised to restrict water to less than 1.2 L a day.Check BMP with follow-up with her PCP in a week.  ESBL E. coli UTI: Abx changed to  to meropenem .Underwent PICC line placement  so that she can complete the antibiotic course at home.  Currently she is hemodynamically stable, afebrile.    Hypertension: Blood pressure is stable.  Will not start on antihypertensive for now.Can monitor as an outpatient.  Hypothyroidism: TSH of 6.99 .Continue current Synthyroid dose.  Check TSH in 3 months as an outpatient  COPD: Currently stable  Chronic  deconditioning/subacute pubic ramus fracture: Was discharged to skilled nursing facility on last admission .PT recommending 24-hour assistance and home health aid. Case manager requested for arranging home health.   Discharge Diagnoses:  Principal Problem:   Hyponatremia Active Problems:   Hypothyroidism   CIGARETTE SMOKER   Pubic ramus fracture (HCC)   Acute lower UTI   Constipation   Cystitis due to Pseudomonas   HTN (hypertension)    Discharge Instructions  Discharge Instructions    Diet - low sodium heart healthy   Complete by:  As directed    Discharge instructions   Complete by:  As directed    1) Follow up with your PCP in a week. Do a BMP test to check your sodium level during the follow up. 2)Please restrict the fluid to less than 1.2 Litres a day. 3) We have arranged home health for the antibiotic infusion at home.   Home infusion instructions [facility] May follow [place] Dosing Protocol; May administer Cathflo as needed to maintain patency of vascular access device.; Flushing of vascular access device: per [facility] Protocol: 0.9% NaCl pre/post medica...   Complete by:  As directed    Instructions:  May follow [place] Dosing Protocol   Instructions:  May administer Cathflo as needed to maintain patency of vascular access device.   Instructions:  Flushing of vascular access device: per [facility] Protocol: 0.9% NaCl pre/post medication administration and prn patency; Heparin 100 u/ml, 12m for implanted ports and Heparin 10u/ml, 566mfor all other central venous catheters.   Instructions:  May follow [facility] Anaphylaxis Protocol for First Dose Administration in the home: 0.9% NaCl at 25-50 ml/hr  to maintain IV access for protocol meds. Epinephrine 0.3 ml IV/IM PRN and Benadryl 25-50 IV/IM PRN s/s of anaphylaxis.   Instructions:  [place] Infusion Coordinator (RN) to assist per patient IV care needs in the home PRN.   Increase activity slowly   Complete by:  As  directed      Allergies as of 04/07/2018      Reactions   Atorvastatin    Intolerant to all statins   Cyclobenzaprine Other (See Comments)   Bradycardia, hypotension   Ezetimibe     INTOL to Zetia   Rosuvastatin    intolerant to all statins   Gadolinium Derivatives Nausea Only   Pt became very nauseous after gadolinium administration//lh   Nicoderm [nicotine] Rash   Unable to use the patches---caused severe rash to area placed      Medication List    TAKE these medications   HYDROcodone-acetaminophen 5-325 MG tablet Commonly known as:  NORCO/VICODIN Take 1-2 tablets by mouth every 6 (six) hours as needed for moderate pain or severe pain.   hydrOXYzine 25 MG tablet Commonly known as:  ATARAX/VISTARIL Take 25 mg by mouth 3 (three) times daily as needed.   levothyroxine 75 MCG tablet Commonly known as:  SYNTHROID, LEVOTHROID TAKE 1 TABLET(75 MCG) BY MOUTH DAILY What changed:  See the new instructions.   meropenem  IVPB Commonly known as:  MERREM Inject 1 g into the vein every 8 (eight) hours for 6 days. Indication:  bacteremia Last Day of Therapy:  04/13/18 Labs - Once weekly:  CBC/D and BMP, Labs - Every other week:  ESR and CRP   nicotine polacrilex 2 MG gum Commonly known as:  NICORETTE Take 1 each (2 mg total) by mouth as needed for smoking cessation.   omeprazole 20 MG capsule Commonly known as:  PRILOSEC Take 1 capsule (20 mg total) by mouth daily.   ondansetron 4 MG disintegrating tablet Commonly known as:  ZOFRAN-ODT Take 1 tablet (4 mg total) by mouth every 8 (eight) hours as needed for nausea or vomiting.   polyethylene glycol packet Commonly known as:  MIRALAX / GLYCOLAX Take 17 g by mouth daily.   senna 8.6 MG Tabs tablet Commonly known as:  SENOKOT Take 1 tablet (8.6 mg total) by mouth 2 (two) times daily.            Home Infusion Instuctions  (From admission, onward)         Start     Ordered   04/07/18 0000  Home infusion  instructions [facility] May follow [place] Dosing Protocol; May administer Cathflo as needed to maintain patency of vascular access device.; Flushing of vascular access device: per [facility] Protocol: 0.9% NaCl pre/post medica...    Question Answer Comment  Instructions May follow [place] Dosing Protocol   Instructions May administer Cathflo as needed to maintain patency of vascular access device.   Instructions Flushing of vascular access device: per [facility] Protocol: 0.9% NaCl pre/post medication administration and prn patency; Heparin 100 u/ml, 70m for implanted ports and Heparin 10u/ml, 564mfor all other central venous catheters.   Instructions May follow [facility] Anaphylaxis Protocol for First Dose Administration in the home: 0.9% NaCl at 25-50 ml/hr to maintain IV access for protocol meds. Epinephrine 0.3 ml IV/IM PRN and Benadryl 25-50 IV/IM PRN s/s of anaphylaxis.   Instructions [facility] Infusion Coordinator (RN) to assist per patient IV care needs in the home PRN.  04/07/18 1104         Follow-up Information    Marin Olp, MD. Schedule an appointment as soon as possible for a visit in 1 week(s).   Specialty:  Family Medicine Contact information: [place] [address] [phone]          Allergies  Allergen Reactions  . Atorvastatin     Intolerant to all statins   . Cyclobenzaprine Other (See Comments)    Bradycardia, hypotension  . Ezetimibe      INTOL to Zetia  . Rosuvastatin     intolerant to all statins  . Gadolinium Derivatives Nausea Only    Pt became very nauseous after gadolinium administration//lh  . Nicoderm [Nicotine] Rash    Unable to use the patches---caused severe rash to area placed    Consultations: Nephrology  Procedures/Studies: Ct Abdomen Pelvis Wo Contrast  Result Date: 04/01/2018 CLINICAL DATA:  Back pain possibly from a pubic ramus fracture. Urinary tract infection. No bowel movement in 1  week. Dysphagia to solids for couple of months. Hyponatremia. Smoker. EXAM: CT ABDOMEN AND PELVIS WITHOUT CONTRAST TECHNIQUE: Multidetector CT imaging of the abdomen and pelvis was performed following the standard protocol without IV contrast. COMPARISON:  CT pelvis 02/27/2018 FINDINGS: Lower chest: Atelectasis and scarring in the lung bases. Mild bronchiectasis with some mucous plugging. Hepatobiliary: Evaluation of the liver is limited without IV contrast material but there are mildly hypodense poorly circumscribed lesions in the medial segment left lobe measuring 2.1 cm diameter and in the dome of the liver measuring 2.2 cm diameter. Density measurements are not consistent with a simple cyst. Consider possibility of metastasis or other solid lesion. Suggest initial characterization with contrast-enhanced CT or MRI. Gallbladder is distended without stone or inflammatory infiltration. No bile duct dilatation. Pancreas: Diffuse fatty infiltration of the pancreas. Spleen: Normal in size without focal abnormality. Adrenals/Urinary Tract: No adrenal gland nodules. Kidneys appear symmetrical. Mildly dilated right renal collecting system and right ureter with slight infiltration around the right kidney. No obstructing stone is identified. This could be sequela of recently passed stone or pyelonephritis. Small amount of gas in the bladder could arise from instrumentation or infection. No bladder wall thickening. Stomach/Bowel: Stomach and small bowel are decompressed. Diffusely stool-filled colon without distention. No wall thickening or inflammatory changes. Diverticulosis of the sigmoid colon without evidence of diverticulitis. Appendix is not identified. Vascular/Lymphatic: Aortic atherosclerosis. No enlarged abdominal or pelvic lymph nodes. Reproductive: Uterus and bilateral adnexa are unremarkable. Other: No abdominal wall hernia or abnormality. No abdominopelvic ascites. Musculoskeletal: Healing fractures of the  right superior and inferior pubic rami and of the sacral ala bilaterally. Also fracture of the right transverse process of L5. Degenerative changes throughout the spine. No destructive bone lesions. IMPRESSION: 1. Healing fractures of the right superior and inferior pubic rami and of the sacral ala bilaterally. Fracture of the right transverse process of L5. 2. Indeterminate hypodense lesions in the medial segment left lobe of the liver measuring 2.1 cm diameter and 2.2 cm diameter. Suggest initial characterization with contrast-enhanced CT or MRI. 3. Mildly dilated right renal collecting system and right ureter with mild infiltration around the right kidney. No obstructing stone is identified. This could be sequela of recently passed stone or pyelonephritis. Small amount of gas in the bladder could arise from instrumentation or infection. 4. Diverticulosis of the sigmoid colon without evidence of diverticulitis. Aortic Atherosclerosis (ICD10-I70.0). Electronically Signed   By: Lucienne Capers M.D.   On: 04/01/2018  21:25   Dg Chest 2 View  Result Date: 03/23/2018 CLINICAL DATA:  Hyponatremia. EXAM: CHEST - 2 VIEW COMPARISON:  Radiograph 04/06/2017 FINDINGS: The cardiomediastinal contours are normal. Chronic hyperinflation and bronchitic change pulmonary vasculature is normal. No consolidation, pleural effusion, or pneumothorax. No acute osseous abnormalities are seen. IMPRESSION: Chronic hyperinflation and bronchial thickening consistent with COPD. No superimposed acute abnormality. Electronically Signed   By: Keith Rake M.D.   On: 03/23/2018 04:40   Dg Abd 1 View  Result Date: 03/23/2018 CLINICAL DATA:  Pt stated that she had been constipated and 2 weeks ago her orthopedic surgeon had given her medications to clean her bowels out. Pt is complaining of feeling weak. Pt stated she has also had urinary frequency over the last 2 days. EXAM: ABDOMEN - 1 VIEW COMPARISON:  03/21/2018 FINDINGS: Stomach and  small bowel decompressed. Normal distribution of gas and stool throughout the nondilated colon. No abnormal abdominal calcifications. Stable spondylitic changes in the lumbar spine. Advanced DJD in the right hip. IMPRESSION: Normal bowel gas pattern. Electronically Signed   By: Lucrezia Europe M.D.   On: 03/23/2018 09:02   Dg Abd 1 View  Result Date: 03/22/2018 CLINICAL DATA:  Constipation EXAM: ABDOMEN - 1 VIEW COMPARISON:  CT pelvis 02/27/2018.  KUB 04/06/2017 FINDINGS: Normal bowel gas pattern. Mild amount of stool in the colon. Negative for bowel obstruction or ileus. Negative for renal calculi Lumbar scoliosis and multilevel degenerative change. Advanced degenerative change in the right hip. Nondisplaced fractures in the right pubic rami are better seen on CT. No displaced fracture on today's study. IMPRESSION: Normal bowel gas pattern with mild stool in the colon. Electronically Signed   By: Franchot Gallo M.D.   On: 03/22/2018 07:56   [place] Ekg Site Rite  Result Date: 04/06/2018 If Site Rite image not attached, placement could not be confirmed due to current cardiac rhythm.      Subjective: Patient seen and examined the bedside this morning.  Remains comfortable.  No active issues.  Stable for discharge to home today.  Discharge Exam: Vitals:   04/06/18 2006 04/07/18 0408  BP: 117/75 (!) 145/86  Pulse: 77 73  Resp: 18 20  Temp: 97.7 F (36.5 C) 97.6 F (36.4 C)  SpO2: 94% 97%   Vitals:   04/06/18 0806 04/06/18 1430 04/06/18 2006 04/07/18 0408  BP:  131/82 117/75 (!) 145/86  Pulse:  76 77 73  Resp:  '20 18 20  ' Temp:  97.8 F (36.6 C) 97.7 F (36.5 C) 97.6 F (36.4 C)  TempSrc:  Oral Oral Oral  SpO2: 95% 96% 94% 97%  Weight:      Height:        General: Pt is alert, awake, not in acute distress Cardiovascular: RRR, S1/S2 +, no rubs, no gallops Respiratory: CTA bilaterally, no wheezing, no rhonchi Abdominal: Soft, NT, ND, bowel sounds + Extremities: no edema, no  cyanosis.New PICC line on the right arm    The results of significant diagnostics from this hospitalization (including imaging, microbiology, ancillary and laboratory) are listed below for reference.     Microbiology: Recent Results (from the past 240 hour(s))  Culture, Urine     Status: Abnormal   Collection Time: 04/01/18  7:17 PM  Result Value Ref Range Status   Specimen Description   Final    URINE, CLEAN CATCH Performed at [facility], [place] [address], [place], [place] [address]    Special Requests   Final  NONE Performed at [facility], [place] [address], [place], [place] [address]    Culture (A)  Final    >=100,000 COLONIES/mL ESCHERICHIA COLI Confirmed Extended Spectrum Beta-Lactamase Producer (ESBL).  In bloodstream infections from ESBL organisms, carbapenems are preferred over piperacillin/tazobactam. They are shown to have a lower risk of mortality.    Report Status 04/07/2018 FINAL  Final   Organism ID, Bacteria ESCHERICHIA COLI (A)  Final      Susceptibility   Escherichia coli - MIC*    AMPICILLIN >=32 RESISTANT Resistant     CEFAZOLIN >=64 RESISTANT Resistant     CEFTRIAXONE >=64 RESISTANT Resistant     CIPROFLOXACIN 0.5 SENSITIVE Sensitive     GENTAMICIN >=16 RESISTANT Resistant     IMIPENEM <=0.25 SENSITIVE Sensitive     NITROFURANTOIN <=16 SENSITIVE Sensitive     TRIMETH/SULFA >=320 RESISTANT Resistant     AMPICILLIN/SULBACTAM 16 INTERMEDIATE Intermediate     PIP/TAZO <=4 SENSITIVE Sensitive     Extended ESBL POSITIVE Resistant     * >=100,000 COLONIES/mL ESCHERICHIA COLI  MRSA PCR Screening     Status: None   Collection Time: 04/02/18  6:04 AM  Result Value Ref Range Status   MRSA by PCR NEGATIVE NEGATIVE Final    Comment:        The GeneXpert MRSA Assay (FDA approved for NASAL specimens only), is one component of a comprehensive MRSA colonization surveillance program. It is not intended to diagnose  MRSA infection nor to guide or monitor treatment for MRSA infections. Performed at [facility], [place] [address], [place], [place] [address]      Labs: BNP (last 3 results) No results for input(s): BNP in the last 8760 hours. Basic Metabolic Panel: Recent Labs  Lab 04/03/18 0614 04/04/18 0601 04/05/18 0532 04/05/18 1635 04/06/18 0554 04/07/18 0531  NA 131* 133* 125* 124* 126* 129*  K 4.2 4.3 3.8 3.9 4.0 3.6  CL 99 98 91* 91* 90* 93*  CO2 '25 25 25 23 25 28  ' GLUCOSE 105* 102* 104* 124* 108* 108*  BUN '12 14 11 13 14 19  ' CREATININE 0.62 0.80 0.69 0.72 0.83 0.84  CALCIUM 8.9 9.7 9.1 8.9 9.2 9.1  MG 2.0  --   --   --   --   --   PHOS 2.2*  --   --   --   --   --    Liver Function Tests: Recent Labs  Lab 04/01/18 1629 04/03/18 0614  AST 28 29  ALT 16 23  ALKPHOS 193* 177*  BILITOT 0.4 0.3  PROT 6.5 6.1*  ALBUMIN 3.3* 3.0*   No results for input(s): LIPASE, AMYLASE in the last 168 hours. No results for input(s): AMMONIA in the last 168 hours. CBC: Recent Labs  Lab 04/01/18 1629 04/03/18 0614  WBC 9.6 9.3  NEUTROABS 7.4 6.9  HGB 12.5 11.3*  HCT 37.3 34.7*  MCV 85.2 88.7  PLT 413* 382   Cardiac Enzymes: No results for input(s): CKTOTAL, CKMB, CKMBINDEX, TROPONINI in the last 168 hours. BNP: Invalid input(s): POCBNP CBG: No results for input(s): GLUCAP in the last 168 hours. D-Dimer No results for input(s): DDIMER in the last 72 hours. Hgb A1c No results for input(s): HGBA1C in the last 72 hours. Lipid Profile No results for input(s): CHOL, HDL, LDLCALC, TRIG, CHOLHDL, LDLDIRECT in the last 72 hours. Thyroid function studies Recent Labs    04/05/18 1937  TSH 6.991*   Anemia work up No  results for input(s): VITAMINB12, FOLATE, FERRITIN, TIBC, IRON, RETICCTPCT in the last 72 hours. Urinalysis    Component Value Date/Time   COLORURINE YELLOW 04/01/2018 1917   APPEARANCEUR HAZY (A) 04/01/2018 1917   LABSPEC 1.006 04/01/2018 1917    PHURINE 6.0 04/01/2018 1917   GLUCOSEU NEGATIVE 04/01/2018 1917   GLUCOSEU NEGATIVE 01/28/2018 1055   HGBUR MODERATE (A) 04/01/2018 [place] 04/01/2018 1917   BILIRUBINUR n 07/16/2015 [place] 04/01/2018 1917   PROTEINUR NEGATIVE 04/01/2018 1917   UROBILINOGEN 0.2 01/28/2018 1055   NITRITE POSITIVE (A) 04/01/2018 1917   LEUKOCYTESUR LARGE (A) 04/01/2018 1917   Sepsis Labs Invalid input(s): PROCALCITONIN,  WBC,  LACTICIDVEN Microbiology Recent Results (from the past 240 hour(s))  Culture, Urine     Status: Abnormal   Collection Time: 04/01/18  7:17 PM  Result Value Ref Range Status   Specimen Description   Final    URINE, CLEAN CATCH Performed at [facility], [place] [address] [place], [place] [address]    Special Requests   Final    NONE Performed at [facility], [place] [address] [place], [place] [address]    Culture (A)  Final    >=100,000 COLONIES/mL ESCHERICHIA COLI Confirmed Extended Spectrum Beta-Lactamase Producer (ESBL).  In bloodstream infections from ESBL organisms, carbapenems are preferred over piperacillin/tazobactam. They are shown to have a lower risk of mortality.    Report Status 04/07/2018 FINAL  Final   Organism ID, Bacteria ESCHERICHIA COLI (A)  Final      Susceptibility   Escherichia coli - MIC*    AMPICILLIN >=32 RESISTANT Resistant     CEFAZOLIN >=64 RESISTANT Resistant     CEFTRIAXONE >=64 RESISTANT Resistant     CIPROFLOXACIN 0.5 SENSITIVE Sensitive     GENTAMICIN >=16 RESISTANT Resistant     IMIPENEM <=0.25 SENSITIVE Sensitive     NITROFURANTOIN <=16 SENSITIVE Sensitive     TRIMETH/SULFA >=320 RESISTANT Resistant     AMPICILLIN/SULBACTAM 16 INTERMEDIATE Intermediate     PIP/TAZO <=4 SENSITIVE Sensitive     Extended ESBL POSITIVE Resistant     * >=100,000 COLONIES/mL ESCHERICHIA COLI  MRSA PCR Screening     Status: None   Collection Time: 04/02/18  6:04 AM  Result Value  Ref Range Status   MRSA by PCR NEGATIVE NEGATIVE Final    Comment:        The GeneXpert MRSA Assay (FDA approved for NASAL specimens only), is one component of a comprehensive MRSA colonization surveillance program. It is not intended to diagnose MRSA infection nor to guide or monitor treatment for MRSA infections. Performed at [facility], [place] [address], [place], [place] [address]     Please note: You were cared for by a hospitalist during your hospital stay. Once you are discharged, your primary care physician will handle any further medical issues. Please note that NO REFILLS for any discharge medications will be authorized once you are discharged, as it is imperative that you return to your primary care physician (or establish a relationship with a primary care physician if you do not have one) for your post hospital discharge needs so that they can reassess your need for medications and monitor your lab values.    Time coordinating discharge: 40 minutes  SIGNED:   Shelly Coss, MD  [facility] 04/07/2018, 11:04 AM Pager [phone]  If 7PM-7AM, please contact night-coverage [email or web address] Password [email or web address]

## 2018-04-07 NOTE — Progress Notes (Signed)
PHARMACY CONSULT NOTE FOR:  OUTPATIENT  PARENTERAL ANTIBIOTIC THERAPY (OPAT)  Indication: bacteremia Regimen: merrem 1gm IV q8h End date: 04/13/18  IV antibiotic discharge orders are pended. To discharging provider:  please sign these orders via discharge navigator,  Select New Orders & click on the button choice - Manage This Unsigned Work.     Thank you for allowing pharmacy to be a part of this patient's care.  Dolly Rias RPh 04/07/2018, 9:41 AM Pager [phone]

## 2018-04-07 NOTE — Care Management Note (Addendum)
Case Management Note  Patient Details  Name: MAHEEN CWIKLA MRN: [id number] Date of Birth: 1940/07/24  Subjective/Objective:  ESBL E Coli UTI                  Action/Plan: NCM spoke to pt and husband at bedside. Offered choice for HH/list provided. Pt agreeable to [facility] for HH and IV abx. Scheduled dc home today with Meropenum with needing evening dose at 10 pm. [facility] will not be able to do a same day start of care and will need to check copay for medication. Made attending aware and plan is for dc 04/08/18 after am 0600 dose. [facility] can do a start of care for 10/21 at 2 pm. Pt has RW and bedside commode at home.   Expected Discharge Date:  04/07/18               Expected Discharge Plan:  [place]  In-House Referral:  NA  Discharge planning Services  CM Consult  Post Acute Care Choice:  Home Health Choice offered to:  Patient  DME Arranged:  N/A DME Agency:  NA  HH Arranged:  RN, IV Antibiotics HH Agency:  [place]  Status of Service:  Completed, signed off  If discussed at [place] of Stay Meetings, dates discussed:    Additional Comments:  Erenest Rasher, RN 04/07/2018, 12:12 PM

## 2018-04-07 NOTE — Progress Notes (Signed)
Peripherally Inserted Central Catheter/Midline Placement  The IV Nurse has discussed with the patient and/or persons authorized to consent for the patient, the purpose of this procedure and the potential benefits and risks involved with this procedure.  The benefits include less needle sticks, lab draws from the catheter, and the patient may be discharged home with the catheter. Risks include, but not limited to, infection, bleeding, blood clot (thrombus formation), and puncture of an artery; nerve damage and irregular heartbeat and possibility to perform a PICC exchange if needed/ordered by physician.  Alternatives to this procedure were also discussed.  Bard Power PICC patient education guide, fact sheet on infection prevention and patient information card has been provided to patient /or left at bedside.    PICC/Midline Placement Documentation  PICC Single Lumen 04/07/18 PICC Right Cephalic 38 cm 0 cm (Active)  Indication for Insertion or Continuance of Line Home intravenous therapies (PICC only) 04/07/2018  9:13 AM  Exposed Catheter (cm) 0 cm 04/07/2018  9:13 AM  Site Assessment Clean;Dry;Intact 04/07/2018  9:13 AM  Line Status Flushed;Saline locked;Blood return noted 04/07/2018  9:13 AM  Dressing Type Transparent 04/07/2018  9:13 AM  Dressing Status Clean;Dry;Intact;Antimicrobial disc in place 04/07/2018  9:13 AM  Line Care Connections checked and tightened 04/07/2018  9:13 AM  Line Adjustment (NICU/IV Team Only) No 04/07/2018  9:13 AM  Dressing Intervention New dressing 04/07/2018  9:13 AM  Dressing Change Due 04/14/18 04/07/2018  9:13 AM       Rolena Infante 04/07/2018, 9:14 AM

## 2018-04-07 NOTE — Progress Notes (Signed)
[facility] Progress Note  Subjective: Na+ ^129, feels good today, going home  Vitals:   04/06/18 0806 04/06/18 1430 04/06/18 2006 04/07/18 0408  BP:  131/82 117/75 (!) 145/86  Pulse:  76 77 73  Resp:  20 18 20   Temp:  97.8 F (36.6 C) 97.7 F (36.5 C) 97.6 F (36.4 C)  TempSrc:  Oral Oral Oral  SpO2: 95% 96% 94% 97%  Weight:      Height:        Inpatient medications: . enoxaparin (LOVENOX) injection  40 mg Subcutaneous Q24H  . levothyroxine  75 mcg Oral QAC breakfast  . pantoprazole  40 mg Oral Daily  . polyethylene glycol  17 g Oral Daily  . senna  1 tablet Oral BID  . sodium chloride flush  10-40 mL Intracatheter Q12H   . meropenem (MERREM) IV 1 g (04/07/18 0527)   acetaminophen **OR** acetaminophen, HYDROcodone-acetaminophen, nicotine polacrilex, ondansetron **OR** ondansetron (ZOFRAN) IV, polyvinyl alcohol, sodium chloride flush, sodium phosphate  Iron/TIBC/Ferritin/ %Sat No results found for: IRON, TIBC, FERRITIN, IRONPCTSAT  Exam: General appearance - pale, NAD Mental status - alert, prob with focus, talkative Eyes - pupils equal and reactive, extraocular eye movements intact, funduscopic exam normal, discs flat and sharp Mouth - mucous membranes moist, pharynx normal without lesions Neck - adenopathy noted PCL Lymphatics - posterior cervical nodes Chest - rales in bases Heart - S1 and S2 normal, S4 present, systolic murmur XL2/4 at apex Abdomen - soft, nontender, nondistended, no masses or organomegaly Extremities - pedal edema 1+ + Skin - pale, trophic changes in feet.      Impression: 1 Hyponatremia - mixed picture, low solute and then some vol excess. Responded to po lasix for latter and fluid restriction.  Ok for dc , cont fluid restriction 800 cc/ d and f/u PCP for labs.  No lasix at dc. Have d/w pmd.  2 Pelvic and L5 fx 3 Hypertension: new will diurese 4. Anemia mild 5. ^ lipids   P as above    Kelly Splinter MD [facility] pager [phone]   04/07/2018, 11:08 AM   Recent Labs  Lab 04/01/18 1629  04/03/18 0614  04/06/18 0554 04/07/18 0531  NA 126*   < > 131*   < > 126* 129*  K 4.1   < > 4.2   < > 4.0 3.6  CL 93*   < > 99   < > 90* 93*  CO2 24   < > 25   < > 25 28  GLUCOSE 108*   < > 105*   < > 108* 108*  BUN 16   < > 12   < > 14 19  CREATININE 0.82   < > 0.62   < > 0.83 0.84  CALCIUM 9.5   < > 8.9   < > 9.2 9.1  PHOS  --   --  2.2*  --   --   --   ALBUMIN 3.3*  --  3.0*  --   --   --    < > = values in this interval not displayed.   Recent Labs  Lab 04/01/18 1629 04/03/18 0614  AST 28 29  ALT 16 23  ALKPHOS 193* 177*  BILITOT 0.4 0.3  PROT 6.5 6.1*   Recent Labs  Lab 04/01/18 1629 04/03/18 0614  WBC 9.6 9.3  NEUTROABS 7.4 6.9  HGB 12.5 11.3*  HCT 37.3 34.7*  MCV 85.2 88.7  PLT 413*  382      

## 2018-04-08 ENCOUNTER — Telehealth (INDEPENDENT_AMBULATORY_CARE_PROVIDER_SITE_OTHER): Payer: Self-pay | Admitting: Orthopaedic Surgery

## 2018-04-08 ENCOUNTER — Telehealth: Payer: Self-pay | Admitting: Family Medicine

## 2018-04-08 DIAGNOSIS — N3000 Acute cystitis without hematuria: Secondary | ICD-10-CM | POA: Diagnosis not present

## 2018-04-08 DIAGNOSIS — Z452 Encounter for adjustment and management of vascular access device: Secondary | ICD-10-CM | POA: Diagnosis not present

## 2018-04-08 DIAGNOSIS — K5790 Diverticulosis of intestine, part unspecified, without perforation or abscess without bleeding: Secondary | ICD-10-CM | POA: Diagnosis not present

## 2018-04-08 DIAGNOSIS — R7881 Bacteremia: Secondary | ICD-10-CM | POA: Diagnosis not present

## 2018-04-08 DIAGNOSIS — S32058D Other fracture of fifth lumbar vertebra, subsequent encounter for fracture with routine healing: Secondary | ICD-10-CM | POA: Diagnosis not present

## 2018-04-08 DIAGNOSIS — Z792 Long term (current) use of antibiotics: Secondary | ICD-10-CM | POA: Diagnosis not present

## 2018-04-08 DIAGNOSIS — S3289XD Fracture of other parts of pelvis, subsequent encounter for fracture with routine healing: Secondary | ICD-10-CM | POA: Diagnosis not present

## 2018-04-08 DIAGNOSIS — S3210XD Unspecified fracture of sacrum, subsequent encounter for fracture with routine healing: Secondary | ICD-10-CM | POA: Diagnosis not present

## 2018-04-08 DIAGNOSIS — F17210 Nicotine dependence, cigarettes, uncomplicated: Secondary | ICD-10-CM | POA: Diagnosis not present

## 2018-04-08 DIAGNOSIS — J449 Chronic obstructive pulmonary disease, unspecified: Secondary | ICD-10-CM | POA: Diagnosis not present

## 2018-04-08 DIAGNOSIS — B965 Pseudomonas (aeruginosa) (mallei) (pseudomallei) as the cause of diseases classified elsewhere: Secondary | ICD-10-CM | POA: Diagnosis not present

## 2018-04-08 DIAGNOSIS — I10 Essential (primary) hypertension: Secondary | ICD-10-CM | POA: Diagnosis not present

## 2018-04-08 LAB — IMMUNOFIXATION ELECTROPHORESIS
IGA: 200 mg/dL (ref 64–422)
IgG (Immunoglobin G), Serum: 654 mg/dL — ABNORMAL LOW (ref 700–1600)
IgM (Immunoglobulin M), Srm: 35 mg/dL (ref 26–217)
TOTAL PROTEIN ELP: 5.8 g/dL — AB (ref 6.0–8.5)

## 2018-04-08 LAB — PROTEIN ELECTROPHORESIS, SERUM
A/G RATIO SPE: 1.1 (ref 0.7–1.7)
ALBUMIN ELP: 3.1 g/dL (ref 2.9–4.4)
ALPHA-1-GLOBULIN: 0.3 g/dL (ref 0.0–0.4)
ALPHA-2-GLOBULIN: 0.9 g/dL (ref 0.4–1.0)
BETA GLOBULIN: 1.1 g/dL (ref 0.7–1.3)
GLOBULIN, TOTAL: 2.8 g/dL (ref 2.2–3.9)
Gamma Globulin: 0.5 g/dL (ref 0.4–1.8)
Total Protein ELP: 5.9 g/dL — ABNORMAL LOW (ref 6.0–8.5)

## 2018-04-08 LAB — BASIC METABOLIC PANEL
ANION GAP: 8 (ref 5–15)
BUN: 13 mg/dL (ref 8–23)
CO2: 26 mmol/L (ref 22–32)
Calcium: 9.4 mg/dL (ref 8.9–10.3)
Chloride: 97 mmol/L — ABNORMAL LOW (ref 98–111)
Creatinine, Ser: 0.75 mg/dL (ref 0.44–1.00)
GFR calc Af Amer: >60 mL/min (ref >60)
GFR calc non Af Amer: >60 mL/min (ref >60)
GLUCOSE: 102 mg/dL — AB (ref 70–99)
POTASSIUM: 3.6 mmol/L (ref 3.5–5.1)
Sodium: 131 mmol/L — ABNORMAL LOW (ref 135–145)

## 2018-04-08 LAB — KAPPA/LAMBDA LIGHT CHAINS
KAPPA, LAMDA LIGHT CHAIN RATIO: 1.57 (ref 0.26–1.65)
Kappa free light chain: 22.9 mg/L — ABNORMAL HIGH (ref 3.3–19.4)
LAMDA FREE LIGHT CHAINS: 14.6 mg/L (ref 5.7–26.3)

## 2018-04-08 MED ORDER — HYDROCODONE-ACETAMINOPHEN 5-325 MG PO TABS: 1.0000 | ORAL_TABLET | Freq: Four times a day (QID) | ORAL | 0 refills | Status: DC | PRN

## 2018-04-08 NOTE — Telephone Encounter (Signed)
Patients son called needing to be advised if his mother needs to be up moving around or if she should be laying down, resting, etc. He states she is still in a lot of pain and wants to make sure they are only helping her, not making her worse. Brets # [phone]

## 2018-04-08 NOTE — Progress Notes (Signed)
Occupational Therapy Treatment Patient Details Name: [name] [name] MRN: [id number] DOB: 11/24/1940 Today's Date: 04/08/2018    History of present illness 77 yo female admitted from SNF with hyponatremia, UTI, weakness. Recent hx of R pubic ramus fx, sacral fx, R L5 transverse process fx. Hx of COPD, hypothyroidism, chronic back pain.    OT comments  Plan is to DC home with [facility]  Follow Up Recommendations  Supervision/Assistance - 24 hour;Home health OT    Equipment Recommendations  3 in 1 bedside commode(if home)    Recommendations for Other Services      Precautions / Restrictions Precautions Precautions: Fall Precaution Comments: pubic ramus fracture Restrictions Weight Bearing Restrictions: No Other Position/Activity Restrictions: WBAT       Mobility Bed Mobility Overal bed mobility: Needs Assistance Bed Mobility: Supine to Sit     Supine to sit: Supervision     General bed mobility comments: for safety.  Transfers Overall transfer level: Needs assistance Equipment used: Rolling walker (2 wheeled) Transfers: Sit to/from [facility] Sit to Stand: Min assist Stand pivot transfers: Min assist       General transfer comment: close guard for safety. VCs safety, hand placement.     Balance Overall balance assessment: Needs assistance;History of Falls Sitting-balance support: No upper extremity supported Sitting balance-Leahy Scale: Good     Standing balance support: During functional activity;No upper extremity supported Standing balance-Leahy Scale: Poor Standing balance comment: washing hands at sink and minguard for balance due to LOB without UE support                           ADL either performed or assessed with clinical judgement   ADL Overall ADL's : Needs assistance/impaired                     Lower Body Dressing: Moderate assistance;Sit to/from stand   Toilet Transfer: Minimal  assistance;Ambulation;BSC;RW Toilet Transfer Details (indicate cue type and reason): VC for hand placement Toileting- Clothing Manipulation and Hygiene: Sit to/from stand;Maximal assistance         General ADL Comments: Educated pt she will need A with all ADL actiivty for safety     Vision       Perception     Praxis      Cognition Arousal/Alertness: Awake/alert Behavior During Therapy: WFL for tasks assessed/performed Overall Cognitive Status: Within Functional Limits for tasks assessed                                          Exercises     Shoulder Instructions       General Comments toileted with cues for hand placement stand to sit onto 3:1 over commode; spouse and son in the room reviewed how to assist up step for home entry and how to perform/assist car transfer with demonstration at EOB    Pertinent Vitals/ Pain       Pain Score: 3  Pain Location: back and L leg Pain Descriptors / Indicators: Aching;Discomfort Pain Intervention(s): Limited activity within patient's tolerance  Home Living                                          Prior Functioning/Environment  Frequency  Min 2X/week        Progress Toward Goals  OT Goals(current goals can now be found in the care plan section)  Progress towards OT goals: Progressing toward goals     Plan Discharge plan needs to be updated    Co-evaluation                 AM-PAC PT "6 Clicks" Daily Activity     Outcome Measure   Help from another person eating meals?: None Help from another person taking care of personal grooming?: A Little Help from another person toileting, which includes using toliet, bedpan, or urinal?: A Little Help from another person bathing (including washing, rinsing, drying)?: A Little Help from another person to put on and taking off regular upper body clothing?: A Little Help from another person to put on and taking off  regular lower body clothing?: A Little 6 Click Score: 19    End of Session Equipment Utilized During Treatment: Rolling walker;Gait belt  OT Visit Diagnosis: Unsteadiness on feet (R26.81);Muscle weakness (generalized) (M62.81)   Activity Tolerance Patient tolerated treatment well   Patient Left with call bell/phone within reach;with family/visitor present;in chair   Nurse Communication Mobility status        Time: 1210-1223 OT Time Calculation (min): 13 min  Charges: OT General Charges $OT Visit: 1 Visit OT Treatments $Self Care/Home Management : 8-22 mins  Kari Baars, [place] [phone] Office- [phone]      [place]land, [name] 04/08/2018, 12:46 PM

## 2018-04-08 NOTE — Telephone Encounter (Signed)
Copied from [place] [id number]. Topic: General - Other >> Apr 08, 2018  1:31 PM Oneta Rack wrote: Relation to pt: self  Call back number: [phone]   Reason for call:  Patient was discharged from [facility] today and advised to follow up with PCP 7 days from discharge, PCP next available appointment is not until 04/23/2018. Patient requesting senna (SENOKOT) tablet 8.6 mg   stating this medication was prescribed while she was in the hospital, please advise

## 2018-04-08 NOTE — Telephone Encounter (Signed)
noted 

## 2018-04-08 NOTE — Telephone Encounter (Signed)
Please advise 

## 2018-04-08 NOTE — Telephone Encounter (Signed)
That medication is over the counter- they can pick it up at any pharmacy.   Cassie- please find a spot for her for hospital follow up- does she qualify for TCM?

## 2018-04-08 NOTE — Telephone Encounter (Signed)
I called no answer. Left message ,  Should be up walking some, with walker then rest.

## 2018-04-08 NOTE — Plan of Care (Signed)
Planned discharge this am. No acute complaints. Plan in place as per discharge summary on 04/07/18. Short script for pain meds ( 15 tabs) provided given subacute pelvic fracture. No charge  [facility] hospitalists

## 2018-04-08 NOTE — Progress Notes (Signed)
Physical Therapy Treatment Patient Details Name: [name] [name] MRN: [id number] DOB: [birth year] Today's Date: 04/08/2018    History of Present Illness 77 yo female admitted from SNF with hyponatremia, UTI, weakness. Recent hx of R pubic ramus fx, sacral fx, R L5 transverse process fx. Hx of COPD, hypothyroidism, chronic back pain.     PT Comments    Patient progressing with mobility and able to demonstrate transfers at times with S.  Continues to need cues for safety and assist for balance when performing ADL's requiring bilateral UE use.  Feel she is stable for d/c home with family assist and follow up HHPT.   Follow Up Recommendations  Home health PT;Supervision/Assistance - 24 hour     Equipment Recommendations       Recommendations for Other Services       Precautions / Restrictions Precautions Precautions: Fall Precaution Comments: pubic ramus fracture Restrictions Weight Bearing Restrictions: No Other Position/Activity Restrictions: WBAT    Mobility  Bed Mobility Overal bed mobility: Needs Assistance Bed Mobility: Supine to Sit     Supine to sit: Supervision     General bed mobility comments: for safety.  Transfers Overall transfer level: Needs assistance Equipment used: Rolling walker (2 wheeled) Transfers: Sit to/from Stand Sit to Stand: Min guard;Supervision         General transfer comment: close guard for safety. VCs safety, hand placement.   Ambulation/Gait Ambulation/Gait assistance: Min guard;Supervision Gait Distance (Feet): 220 Feet Assistive device: Rolling walker (2 wheeled) Gait Pattern/deviations: Step-through pattern;Decreased stride length;Trunk flexed;Antalgic     General Gait Details: leaning R and flexed, cues and faciliation for hips under shoulders and trunk extension as much as tolerated.   Stairs             Wheelchair Mobility    Modified Rankin (Stroke Patients Only)       Balance Overall balance  assessment: Needs assistance;History of Falls Sitting-balance support: No upper extremity supported Sitting balance-Leahy Scale: Good     Standing balance support: During functional activity;No upper extremity supported Standing balance-Leahy Scale: Poor Standing balance comment: washing hands at sink and minguard for balance due to LOB without UE support                            Cognition Arousal/Alertness: Awake/alert Behavior During Therapy: WFL for tasks assessed/performed Overall Cognitive Status: Within Functional Limits for tasks assessed                                        Exercises      General Comments General comments (skin integrity, edema, etc.): toileted with cues for hand placement stand to sit onto 3:1 over commode; spouse and son in the room reviewed how to assist up step for home entry and how to perform/assist car transfer with demonstration at EOB      Pertinent Vitals/Pain Pain Score: 6  Pain Location: back and L leg Pain Descriptors / Indicators: Aching;Discomfort Pain Intervention(s): Monitored during session;Repositioned    Home Living                      Prior Function            PT Goals (current goals can now be found in the care plan section) Progress towards PT goals: Progressing toward goals    Frequency  Min 3X/week      PT Plan Current plan remains appropriate    Co-evaluation              AM-PAC PT "6 Clicks" Daily Activity  Outcome Measure  Difficulty turning over in bed (including adjusting bedclothes, sheets and blankets)?: A Little Difficulty moving from lying on back to sitting on the side of the bed? : A Little Difficulty sitting down on and standing up from a chair with arms (e.g., wheelchair, bedside commode, etc,.)?: A Little Help needed moving to and from a bed to chair (including a wheelchair)?: A Little Help needed walking in hospital room?: A Little Help needed  climbing 3-5 steps with a railing? : A Lot 6 Click Score: 17    End of Session Equipment Utilized During Treatment: Gait belt Activity Tolerance: Patient tolerated treatment well Patient left: with call bell/phone within reach;in chair;with family/visitor present   PT Visit Diagnosis: Muscle weakness (generalized) (M62.81);Pain;Unsteadiness on feet (R26.81)     Time: 1018-1050 PT Time Calculation (min) (ACUTE ONLY): 32 min  Charges:  $Gait Training: 8-22 mins $Therapeutic Activity: 8-22 mins                     Magda Kiel, PT Acute Rehabilitation Services [phone] 04/08/2018    Reginia Naas 04/08/2018, 11:12 AM

## 2018-04-09 ENCOUNTER — Telehealth: Payer: Self-pay | Admitting: *Deleted

## 2018-04-09 ENCOUNTER — Telehealth: Payer: Self-pay | Admitting: Family Medicine

## 2018-04-09 NOTE — Telephone Encounter (Signed)
Copied from [place] [id number]. Topic: Quick Communication - Rx Refill/Question >> Apr 09, 2018  8:48 AM Judyann Munson wrote: Medication: HYDROcodone-acetaminophen (NORCO/VICODIN) 5-325 MG tablet   Has the patient contacted their pharmacy? No  Preferred Pharmacy (with phone number or street name): [facility] [place], [address] AT [address] [place] [phone] (Phone) [phone] (Fax)    Agent: Please be advised that RX refills may take up to 3 business days. We ask that you follow-up with your pharmacy.

## 2018-04-09 NOTE — Telephone Encounter (Signed)
Per chart review: Admit date: 04/01/2018 Discharge date: 04/07/2018  Admitted From: Home Disposition:  Home  Discharge Condition:Stable CODE STATUS:FULL Diet recommendation:  Regular  __________________________________________________________ Per telephone encounter: Transition Care Management Follow-up Telephone Call   Date discharged? 04/08/18   How have you been since you were released from the hospital? "Ok, my son is staying with me for now"   Do you understand why you were in the hospital? yes   Do you understand the discharge instructions? yes   Where were you discharged to? Home   Items Reviewed:  Medications reviewed: yes  Allergies reviewed: yes  Dietary changes reviewed: yes  Referrals reviewed: yes   Functional Questionnaire:   Activities of Daily Living (ADLs):   She states they are independent in the following: ambulation, bathing and hygiene, feeding, continence, grooming, toileting and dressing States they require assistance with the following: son assisting    Any transportation issues/concerns?: yes   Any patient concerns? yes, Patient wanted to be seen ASAP due to pain medications.   Confirmed importance and date/time of follow-up visits scheduled yes  Provider Appointment booked with Dr Yong Channel 04/10/18 10:45. Will be here at 10:30  Confirmed with patient if condition begins to worsen call PCP or go to the ER.  Patient was given the office number and encouraged to call back with question or concerns.  : yes

## 2018-04-09 NOTE — Telephone Encounter (Signed)
Yes, she was on my list this morning so I will give her a call today and move up her appt and let her know about the medication.

## 2018-04-09 NOTE — Telephone Encounter (Signed)
noted thanks  

## 2018-04-10 ENCOUNTER — Telehealth: Payer: Self-pay | Admitting: Family Medicine

## 2018-04-10 ENCOUNTER — Ambulatory Visit: Payer: Medicare Other | Admitting: Family Medicine

## 2018-04-10 ENCOUNTER — Encounter: Payer: Self-pay | Admitting: Family Medicine

## 2018-04-10 VITALS — BP 134/76 | HR 89 | Temp 97.7°F | Ht 67.0 in | Wt 145.0 lb

## 2018-04-10 DIAGNOSIS — S32599D Other specified fracture of unspecified pubis, subsequent encounter for fracture with routine healing: Secondary | ICD-10-CM

## 2018-04-10 DIAGNOSIS — N390 Urinary tract infection, site not specified: Secondary | ICD-10-CM | POA: Diagnosis not present

## 2018-04-10 DIAGNOSIS — E039 Hypothyroidism, unspecified: Secondary | ICD-10-CM

## 2018-04-10 DIAGNOSIS — M545 Low back pain: Secondary | ICD-10-CM

## 2018-04-10 DIAGNOSIS — R5381 Other malaise: Secondary | ICD-10-CM

## 2018-04-10 DIAGNOSIS — R030 Elevated blood-pressure reading, without diagnosis of hypertension: Secondary | ICD-10-CM

## 2018-04-10 DIAGNOSIS — I700 Atherosclerosis of aorta: Secondary | ICD-10-CM | POA: Insufficient documentation

## 2018-04-10 DIAGNOSIS — R160 Hepatomegaly, not elsewhere classified: Secondary | ICD-10-CM | POA: Diagnosis not present

## 2018-04-10 DIAGNOSIS — F17200 Nicotine dependence, unspecified, uncomplicated: Secondary | ICD-10-CM

## 2018-04-10 DIAGNOSIS — E871 Hypo-osmolality and hyponatremia: Secondary | ICD-10-CM

## 2018-04-10 DIAGNOSIS — G8929 Other chronic pain: Secondary | ICD-10-CM

## 2018-04-10 DIAGNOSIS — K5900 Constipation, unspecified: Secondary | ICD-10-CM

## 2018-04-10 LAB — COMPREHENSIVE METABOLIC PANEL
ALK PHOS: 192 U/L — AB (ref 39–117)
ALT: 31 U/L (ref 0–35)
AST: 37 U/L (ref 0–37)
Albumin: 3.9 g/dL (ref 3.5–5.2)
BUN: 16 mg/dL (ref 6–23)
CHLORIDE: 95 meq/L — AB (ref 96–112)
CO2: 25 mEq/L (ref 19–32)
CREATININE: 0.74 mg/dL (ref 0.40–1.20)
Calcium: 10.1 mg/dL (ref 8.4–10.5)
GFR: 80.43 mL/min (ref >60.00)
Glucose, Bld: 99 mg/dL (ref 70–99)
POTASSIUM: 3.6 meq/L (ref 3.5–5.1)
SODIUM: 129 meq/L — AB (ref 135–145)
Total Bilirubin: 0.4 mg/dL (ref 0.2–1.2)
Total Protein: 7 g/dL (ref 6.0–8.3)

## 2018-04-10 LAB — CBC WITH DIFFERENTIAL/PLATELET
BASOS ABS: 0.1 10*3/uL (ref 0.0–0.1)
Basophils Relative: 0.9 % (ref 0.0–3.0)
Eosinophils Absolute: 0.3 10*3/uL (ref 0.0–0.7)
Eosinophils Relative: 3.3 % (ref 0.0–5.0)
HEMATOCRIT: 36 % (ref 36.0–46.0)
Hemoglobin: 12.1 g/dL (ref 12.0–15.0)
LYMPHS PCT: 11.3 % — AB (ref 12.0–46.0)
Lymphs Abs: 1.2 10*3/uL (ref 0.7–4.0)
MCHC: 33.7 g/dL (ref 30.0–36.0)
MCV: 85.9 fl (ref 78.0–100.0)
MONOS PCT: 6.6 % (ref 3.0–12.0)
Monocytes Absolute: 0.7 10*3/uL (ref 0.1–1.0)
Neutro Abs: 8 10*3/uL — ABNORMAL HIGH (ref 1.4–7.7)
Neutrophils Relative %: 77.9 % — ABNORMAL HIGH (ref 43.0–77.0)
Platelets: 464 10*3/uL — ABNORMAL HIGH (ref 150.0–400.0)
RBC: 4.19 Mil/uL (ref 3.87–5.11)
RDW: 13.9 % (ref 11.5–15.5)
WBC: 10.3 10*3/uL (ref 4.0–10.5)

## 2018-04-10 MED ORDER — HYDROCODONE-ACETAMINOPHEN 5-325 MG PO TABS: 1.0000 | ORAL_TABLET | Freq: Four times a day (QID) | ORAL | 0 refills | Status: DC | PRN

## 2018-04-10 NOTE — Assessment & Plan Note (Signed)
Patient has been having really good bowel movements with senna.  She will continue these nightly.  She denies being on MiraLAX at present or twice daily senna.  Discussed constipation may worsen given narcotic use- and importance of getting off of narcotics quickly.

## 2018-04-10 NOTE — Assessment & Plan Note (Signed)
Patient's pain in relation to her pelvis/groin is minimal-she is primarily complaining of back pain at this point.

## 2018-04-10 NOTE — Assessment & Plan Note (Signed)
Liver mass-small but x2 at about 2.1 and 2.2 cm in diameter.  We discussed contrast-enhanced CT or MRI at future visit in 2 to 3 weeks-holding off for now to make sure she remains stable

## 2018-04-10 NOTE — Telephone Encounter (Signed)
Copied from [place] [id number]. Topic: Quick Communication - Home Health Verbal Orders >> Apr 10, 2018  3:30 PM Cecelia Byars, [place] wrote: Caller/Agency: [place] Number: [phone]  [id number] PT /Frequency:  1 week 1 2 week 4

## 2018-04-10 NOTE — Patient Instructions (Addendum)
For tomorrow Take 2 hydrocodone in the morning, may repeat 1 pill every 6 hours through day.   For Friday Take 1 hydrocodone every 6 hours  For Saturday Take 1 hydrocodone in AM, half tablet every 6 hours  For Sunday Take half hydrocodone every 6 hours  For Monday Take half hydrocodone every 8 hours  For Tuesday and beyond Can use half hydrocodone twice a day until you run out  Call orthopedics for follow up- we need to have them evaluate your back pain further   Please stop by lab before you go May get another sodium test next week, then want to see you in 2-3 weeks to check in - in person. We need to do a follow up test for some small liver spots but want to make sure you are doing well before doing these

## 2018-04-10 NOTE — Assessment & Plan Note (Signed)
Patient should use nicotine patches or cessation from cigarettes.  Has quit in the past with patches.  COPD was listed in the hospitalization-while this is possible I do not believe she has been formally diagnosed so we will leave this off the list for now but can consider pulmonary consultation in the future.
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
## 2018-04-10 NOTE — Telephone Encounter (Signed)
See note

## 2018-04-10 NOTE — Assessment & Plan Note (Signed)
Patient noted is hypertensive during hospitalization.  We have not confirmed this on outpatient basis when not acutely ill.  We will change this back to elevated blood pressure without diagnosis of hypertension.  Continue to monitor

## 2018-04-10 NOTE — Assessment & Plan Note (Signed)
Hypervolemic hyponatremia- encouraged continued fluid restriction to 1.2 L/day or approximately 40 ounces.  She is off Lasix- sodium is stable today at 129 which is the same as hospital discharge.  We will see if home health can draw another CBC from her PICC line early next week before they remove the PICC line.  Hoping to maintain at least 128 or above.  May need to reinstitute Lasix if worsens again.

## 2018-04-10 NOTE — Assessment & Plan Note (Signed)
Due to ESBL E. coli.  Patient is completing 10-day course of meropenem through PICC line.  Consider repeat urine culture at follow-up to ensure clearance

## 2018-04-10 NOTE — Assessment & Plan Note (Signed)
Mild TSH elevation.  Continue levothyroxine 75 mcg.  Consider repeat TSH at follow-up visit in 2 to 3 weeks.

## 2018-04-10 NOTE — Progress Notes (Signed)
Sodium down slightly to 129 but still stable from time of hospital discharge.  Team-please see if home health can draw 1 more CMET under hyponatremia before they remove the PICC line.  One liver test a hair high-we will monitor. Anemia has improved.  Platelets are high which can be seen in inflammation.  Neutrophils are high- could be from body  fighting off urinary tract infection.

## 2018-04-10 NOTE — Assessment & Plan Note (Signed)
Noted on CT scan of abdomen and pelvis.  Blood pressure well controlled.  Consider getting updated lipids at follow-up and starting statin- would want patient stability to have improved by that point before starting statin due to potential for muscle weakness with exercise or myalgias
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
## 2018-04-10 NOTE — Assessment & Plan Note (Signed)
Had some debility due to deconditioning from pubic ramus fracture- she did not do well in the skilled nursing facility.  She will continue physical therapy at home.  I am willing to sign off on home health certification the recertification was needed.

## 2018-04-11 LAB — CULTURE, BLOOD (ROUTINE X 2)
CULTURE: NO GROWTH
Culture: NO GROWTH
SPECIAL REQUESTS: ADEQUATE
SPECIAL REQUESTS: ADEQUATE

## 2018-04-11 NOTE — Telephone Encounter (Signed)
Called and left a voicemail message providing verbal order as requested

## 2018-04-12 ENCOUNTER — Telehealth: Payer: Self-pay | Admitting: Family Medicine

## 2018-04-12 NOTE — Telephone Encounter (Signed)
Spoke with patient and reviewed lab results.   They are going to need order to remove picc line. Before that's done I want another cmet under hyponatremia and another cbc with differential under anemia and a urine culture under acute cystitis. See if homecare can do this or we need her in Monday or Tuesday of next week to do these.

## 2018-04-12 NOTE — Telephone Encounter (Signed)
Copied from [place] [id number]. Topic: General - Other >> Apr 12, 2018  1:43 PM Maretta Stare wrote:  Pt son Abe People cal lto ask for a return call with pt lab results. He was concern about her sodium level

## 2018-04-15 NOTE — Telephone Encounter (Signed)
[name] [name]- are they able to get urine culture too?

## 2018-04-15 NOTE — Telephone Encounter (Signed)
See note  Copied from [place] [id number]. Topic: Quick Communication - See Telephone Encounter >> Apr 15, 2018  4:39 PM Blase Mess A wrote: CRM for notification. See Telephone encounter for: 04/15/18. [name] from [facility] is calling for [facility], orders were to request labs, however according to the patient the patient is having urine sample tomorrow. Please advise [phone]

## 2018-04-15 NOTE — Telephone Encounter (Signed)
See note

## 2018-04-15 NOTE — Telephone Encounter (Signed)
Copied from [place] [id number]. Topic: Appointment Scheduling - Scheduling Inquiry for Clinic >> Apr 15, 2018  1:12 PM [place], Lanice Schwab wrote: Reason for CRM: Patient's son Abe People called regarding Dr. Yong Channel requesting she has a urinalysis. Home health nurse visit today at 1:00 pm but they are only performing blood draw.   There is not an order placed in Epic and Dr. Yong Channel is not in today.  Billy asked if he could let his mother use the urine kit Dr. Yong Channel sent and I advised based on my call with the office that someone would call Billy back tomorrow at [phone] with instructions on whether to use the urine kit or come into the office for a urinalysis.

## 2018-04-15 NOTE — Telephone Encounter (Signed)
Called and spoke with son [name] [name] who states that her [place] agency is [facility]. Their telephone number provided is [phone]. I called and spoke with the nurse Estill Bamberg asking if we could order labs to be drawn prior to them pulling her PICC line. Provided verbal orders to [name] who stated they would be drawn tomorrow. [name] confirmed they have no order currently to pull PICC line.

## 2018-04-16 ENCOUNTER — Telehealth: Payer: Self-pay | Admitting: Family Medicine

## 2018-04-16 NOTE — Telephone Encounter (Signed)
Copied from [place] [id number]. Topic: Quick Communication - See Telephone Encounter >> Apr 16, 2018 10:34 AM Percell Belt A wrote: CRM for notification. See Telephone encounter for: 04/16/18. Pt son called in and would like someone to call him back with pts labs results she had done on 10/23  Best call [phone]

## 2018-04-16 NOTE — Telephone Encounter (Signed)
[name] [name] from [place] called back to confirm that all blood work was collected yesterday and that Urine will be collected when physical therapy goes out to the home tomorrow. All results will be faxed to the office.

## 2018-04-16 NOTE — Telephone Encounter (Signed)
Confirmed that PT will collect Urine Culture tomorrow morning 04/17/2018

## 2018-04-16 NOTE — Telephone Encounter (Signed)
Called and spoke to the son Abe People who was calling to see if we had the results from the labs that were drawn yesterday 04/15/2018. I told him we don't have the results yet. We will call when we get results. I also told him that PT will be collecting a urine specimen tomorrow morning. He verbalized understanding

## 2018-04-16 NOTE — Telephone Encounter (Signed)
Called and spoke to someone at [name]. Instructed that we had given orders to be completed today and that a Urine was part of it. No one knows who [name] [name] is providing a sample to [name] [name], what test are ordered, and if we will be provided the results. [facility] was calling the nurse to clarify that the orders this office provided were completed and to get more information about who is requesting other labs. They will return my call

## 2018-04-17 ENCOUNTER — Encounter: Payer: Self-pay | Admitting: Family Medicine

## 2018-04-17 ENCOUNTER — Telehealth: Payer: Self-pay | Admitting: Family Medicine

## 2018-04-17 DIAGNOSIS — B965 Pseudomonas (aeruginosa) (mallei) (pseudomallei) as the cause of diseases classified elsewhere: Secondary | ICD-10-CM | POA: Diagnosis not present

## 2018-04-17 DIAGNOSIS — N3000 Acute cystitis without hematuria: Secondary | ICD-10-CM | POA: Diagnosis not present

## 2018-04-17 NOTE — Telephone Encounter (Signed)
Spoke with pt's son. Pt is having Pelvic pain where fracture is located.. Hip area radiating to back, not sleeping, not able to move around as much. Son would like to know if pt can have another RX until she is able to see Dr Lorin Mercy.   Currently taking 1/2 tablet twice daily hydrocodone 5-325   [place] Controlled Substance Database checked. Last filled on 04/10/2018

## 2018-04-17 NOTE — Telephone Encounter (Signed)
Copied from [place] [id number]. Topic: Quick Communication - See Telephone Encounter >> Apr 17, 2018  9:23 AM Ahmed Prima L wrote: CRM for notification. See Telephone encounter for: 04/17/18.  Patient's son, Abe People would like a call back from the nurse or Dr Yong Channel about managing her pain and what can be done. [phone]

## 2018-04-17 NOTE — Telephone Encounter (Signed)
She needs to wean as we discussed- pain that she described in visit was not related to fracture but instead related to chronic back pain. Could consider pain management referral if needed in future if they are not aware of additional ways to help the chronic pain pain. I would save any remaining half tablets for before bed.

## 2018-04-18 ENCOUNTER — Ambulatory Visit: Payer: Medicare Other | Admitting: Sports Medicine

## 2018-04-18 DIAGNOSIS — Z452 Encounter for adjustment and management of vascular access device: Secondary | ICD-10-CM

## 2018-04-18 DIAGNOSIS — R7881 Bacteremia: Secondary | ICD-10-CM | POA: Diagnosis not present

## 2018-04-18 DIAGNOSIS — Z792 Long term (current) use of antibiotics: Secondary | ICD-10-CM

## 2018-04-18 DIAGNOSIS — S3210XD Unspecified fracture of sacrum, subsequent encounter for fracture with routine healing: Secondary | ICD-10-CM

## 2018-04-18 DIAGNOSIS — B965 Pseudomonas (aeruginosa) (mallei) (pseudomallei) as the cause of diseases classified elsewhere: Secondary | ICD-10-CM | POA: Diagnosis not present

## 2018-04-18 DIAGNOSIS — I10 Essential (primary) hypertension: Secondary | ICD-10-CM

## 2018-04-18 DIAGNOSIS — S3289XD Fracture of other parts of pelvis, subsequent encounter for fracture with routine healing: Secondary | ICD-10-CM | POA: Diagnosis not present

## 2018-04-18 DIAGNOSIS — F17210 Nicotine dependence, cigarettes, uncomplicated: Secondary | ICD-10-CM

## 2018-04-18 DIAGNOSIS — S32058D Other fracture of fifth lumbar vertebra, subsequent encounter for fracture with routine healing: Secondary | ICD-10-CM

## 2018-04-18 DIAGNOSIS — K5790 Diverticulosis of intestine, part unspecified, without perforation or abscess without bleeding: Secondary | ICD-10-CM

## 2018-04-18 DIAGNOSIS — J449 Chronic obstructive pulmonary disease, unspecified: Secondary | ICD-10-CM

## 2018-04-18 DIAGNOSIS — N3000 Acute cystitis without hematuria: Secondary | ICD-10-CM | POA: Diagnosis not present

## 2018-04-18 NOTE — Telephone Encounter (Signed)
Pt's son called back.  He states he thought he was supposed to get a call back after speaking with assistant yesterday.

## 2018-04-18 NOTE — Telephone Encounter (Signed)
See note

## 2018-04-18 NOTE — Telephone Encounter (Signed)
I would recommend they move up appointment with Dr. Lorin Mercy. Dr. Lorin Mercy has recommended no further oxycodone or hydrocodone and I don't want to put her on a long term regimen against his recommendations- I was ok with short term trial to help her wean off- they can call to see if they can get appointment moved up.   I do not think continuing narcotics without clear indication is advisable. Pain seems to be a flare up of low back pain. I would advise trial of icy hot and continue scheduled tylenol.

## 2018-04-18 NOTE — Telephone Encounter (Signed)
Patient son [name] [name] is calling back and states he is needing Dr. Yong Channel or nurse to call him back. He states he was supposed to get a call back and has not.   [phone]

## 2018-04-18 NOTE — Telephone Encounter (Signed)
Called and spoke with son who states that she is scheduled to see Dr. Yong Channel next week and Dr. Lorin Mercy a week after that. Son states that PT has increased patient's pain level. She is out of medication tonight. Wants to know if something can be sent in to get her to her appointment next week?

## 2018-04-19 DIAGNOSIS — N3000 Acute cystitis without hematuria: Secondary | ICD-10-CM | POA: Diagnosis not present

## 2018-04-19 DIAGNOSIS — B965 Pseudomonas (aeruginosa) (mallei) (pseudomallei) as the cause of diseases classified elsewhere: Secondary | ICD-10-CM | POA: Diagnosis not present

## 2018-04-19 LAB — HEPATIC FUNCTION PANEL
ALK PHOS: 206 — AB (ref 25–125)
ALT: 15 (ref 7–35)
AST: 44 — AB (ref 13–35)
Bilirubin, Total: 0.3

## 2018-04-19 LAB — BASIC METABOLIC PANEL
BUN: 11 (ref 4–21)
CREATININE: 0.6 (ref 0.5–1.1)
GLUCOSE: 115
Potassium: 3.9 (ref 3.4–5.3)
Sodium: 128 — AB (ref 137–147)

## 2018-04-19 LAB — CBC AND DIFFERENTIAL
HCT: 30 — AB (ref 36–46)
Hemoglobin: 10.4 — AB (ref 12.0–16.0)
Neutrophils Absolute: 7
Platelets: 531 — AB (ref 150–399)
WBC: 9.7

## 2018-04-19 MED ORDER — HYDROCODONE-ACETAMINOPHEN 5-325 MG PO TABS: 0.5000 | ORAL_TABLET | Freq: Two times a day (BID) | ORAL | 0 refills | Status: AC | PRN

## 2018-04-19 NOTE — Addendum Note (Signed)
Addended by: Marin Olp on: 04/19/2018 05:58 PM   Modules accepted: Orders

## 2018-04-19 NOTE — Telephone Encounter (Signed)
Spoke with son [name] [name]- patient has been having trouble sleeping at night due to pain in her back. She then seems more easily anxious/irritaged in the daytime. Discussed dont want to encourage dependency on narcotics or cause constipation but will give 5 more talets of hydrocodone for her to use half tablet before bedtime to get to visit with Dr. Lorin Mercy  He mentions some drooling more than normal but denies facial droop  Mentions anxiety being worse and asks about medicine for this- we discussed working on pain control and addressing anxiety if doesn't improve with more sleep/better pain control

## 2018-04-19 NOTE — Telephone Encounter (Signed)
Pt son Haskel Khan is calling back to talk with [facility]

## 2018-04-19 NOTE — Telephone Encounter (Signed)
Called and spoke to patients son [name] [name]. He is very frustrated with coordination of care. He states that they cannot get into Dr Lorin Mercy office any sooner. He also states that [facility] lost all patients blood work so home health had to come back out today. He is concerned between that and her pain that she may end up back in the hospital. I again reiterated that Dr Yong Channel is following recommendations based on Dr Lorin Mercy. I again suggested that he call Dr Lorin Mercy office to update them and see about a sooner appointment. Patients son states that he will be very upset if patient ends up back in the hospital, he states she is having anxiety and sleep problems from the pain. He would like to receive a call from Dr Yong Channel directly.

## 2018-04-22 ENCOUNTER — Encounter: Payer: Self-pay | Admitting: Family Medicine

## 2018-04-22 ENCOUNTER — Telehealth: Payer: Self-pay | Admitting: Family Medicine

## 2018-04-22 DIAGNOSIS — Z1624 Resistance to multiple antibiotics: Principal | ICD-10-CM

## 2018-04-22 DIAGNOSIS — A498 Other bacterial infections of unspecified site: Secondary | ICD-10-CM

## 2018-04-22 DIAGNOSIS — Z1613 Resistance to carbapenem: Secondary | ICD-10-CM

## 2018-04-22 DIAGNOSIS — Z1612 Extended spectrum beta lactamase (ESBL) resistance: Secondary | ICD-10-CM

## 2018-04-22 MED ORDER — NITROFURANTOIN MONOHYD MACRO 100 MG PO CAPS: 100.0000 mg | ORAL_CAPSULE | Freq: Two times a day (BID) | ORAL | 0 refills | Status: DC

## 2018-04-22 NOTE — Telephone Encounter (Signed)
Received urine culture result. We were waiting to pull her picc line until urine cuture results wer eback.   Unfortunately it appears her E. Coli has become resistant to meropenem and she has 50,000-100,000 colonies noted.   She also has 100,000 colonies of Enterococcus ( a different bacteria in urine)   Team- please help process urgent referral to infectious disease that I placed  Team- make a copy of the report from [facility] and include this with referral to ID. Lets keep it in our office instead of sending to scan until we know they have a copy.   I am placing her on 7 days of nitrofurantoin as both enterococcus and E. Coli are sensitive- holding off on quinolone until we get ID opinion.     Her sodium is slightly worse at 128- is she making sure to still restrict her fluids? If she is- have her reduce by another 5-10 oz per day.   Mild anemia noted- worse than last few checks- should check again in next 1-2 weeks but want to work up urine issues first.   2 of liver tests are a hair high. We also should recheck those in 1-2 weeks. CRP test was high (marker of inflammation) but ESR was low (another marker of inflammation- we should continue to monitor. I did send these labs for scan.

## 2018-04-23 ENCOUNTER — Inpatient Hospital Stay: Payer: Medicare Other | Admitting: Family Medicine

## 2018-04-23 ENCOUNTER — Telehealth: Payer: Self-pay | Admitting: Family Medicine

## 2018-04-23 NOTE — Telephone Encounter (Signed)
Called and spoke to son Abe People. He states they scheduled an appointment with Infectious Disease as they called them today. The first appointment they had was 04/30/18. He did pick up the nitrofurantion and his mom is starting it today. The nurse came out to draw blood work today from the PICC line. She was unable to draw any blood. They are able to flush the PICC line but can't draw blood from it. They will restrict fluid by 5-10 oz as instructed.  Patient is scheduled for this Thursday with Dr. Yong Channel for a follow up.

## 2018-04-23 NOTE — Telephone Encounter (Signed)
Son calling to get results of pt's labs and discuss with Dr Yong Channel. Please call back.

## 2018-04-23 NOTE — Telephone Encounter (Signed)
See note  Copied from [place] [phone]. Topic: General - Other >> Apr 23, 2018  3:25 PM Delancey Stare wrote:  Pt son Abe People call to say the nurse that came out today was not able to draw blood and he is asking if they can come in tomorrow to have blood drawn/ Pt has a pic line and nurse was not able to get blood

## 2018-04-25 ENCOUNTER — Telehealth: Payer: Self-pay

## 2018-04-25 ENCOUNTER — Telehealth: Payer: Self-pay | Admitting: Radiology

## 2018-04-25 ENCOUNTER — Inpatient Hospital Stay (HOSPITAL_COMMUNITY)
Admission: EM | Admit: 2018-04-25 | Discharge: 2018-05-11 | DRG: 542 | Disposition: A | Discharge status: Skilled Nursing Facility | Payer: Medicare Other | Attending: Internal Medicine | Admitting: Internal Medicine

## 2018-04-25 ENCOUNTER — Encounter (HOSPITAL_COMMUNITY): Payer: Self-pay

## 2018-04-25 ENCOUNTER — Ambulatory Visit: Payer: Medicare Other | Admitting: Family Medicine

## 2018-04-25 ENCOUNTER — Encounter: Payer: Self-pay | Admitting: Family Medicine

## 2018-04-25 ENCOUNTER — Inpatient Hospital Stay: Payer: Medicare Other | Admitting: Family Medicine

## 2018-04-25 ENCOUNTER — Other Ambulatory Visit: Payer: Self-pay

## 2018-04-25 VITALS — BP 129/69 | HR 86 | Temp 98.2°F | Ht 67.0 in | Wt 148.2 lb

## 2018-04-25 DIAGNOSIS — M2550 Pain in unspecified joint: Secondary | ICD-10-CM | POA: Diagnosis not present

## 2018-04-25 DIAGNOSIS — C787 Secondary malignant neoplasm of liver and intrahepatic bile duct: Secondary | ICD-10-CM | POA: Diagnosis not present

## 2018-04-25 DIAGNOSIS — I5031 Acute diastolic (congestive) heart failure: Secondary | ICD-10-CM | POA: Diagnosis present

## 2018-04-25 DIAGNOSIS — Z974 Presence of external hearing-aid: Secondary | ICD-10-CM | POA: Diagnosis not present

## 2018-04-25 DIAGNOSIS — S22000A Wedge compression fracture of unspecified thoracic vertebra, initial encounter for closed fracture: Secondary | ICD-10-CM | POA: Diagnosis not present

## 2018-04-25 DIAGNOSIS — E039 Hypothyroidism, unspecified: Secondary | ICD-10-CM | POA: Diagnosis not present

## 2018-04-25 DIAGNOSIS — M47814 Spondylosis without myelopathy or radiculopathy, thoracic region: Secondary | ICD-10-CM | POA: Diagnosis not present

## 2018-04-25 DIAGNOSIS — Z833 Family history of diabetes mellitus: Secondary | ICD-10-CM

## 2018-04-25 DIAGNOSIS — K219 Gastro-esophageal reflux disease without esophagitis: Secondary | ICD-10-CM | POA: Diagnosis not present

## 2018-04-25 DIAGNOSIS — F17210 Nicotine dependence, cigarettes, uncomplicated: Secondary | ICD-10-CM | POA: Diagnosis present

## 2018-04-25 DIAGNOSIS — M8458XA Pathological fracture in neoplastic disease, other specified site, initial encounter for fracture: Secondary | ICD-10-CM | POA: Diagnosis not present

## 2018-04-25 DIAGNOSIS — D649 Anemia, unspecified: Secondary | ICD-10-CM | POA: Diagnosis present

## 2018-04-25 DIAGNOSIS — M47816 Spondylosis without myelopathy or radiculopathy, lumbar region: Secondary | ICD-10-CM | POA: Diagnosis present

## 2018-04-25 DIAGNOSIS — M8448XA Pathological fracture, other site, initial encounter for fracture: Secondary | ICD-10-CM | POA: Diagnosis not present

## 2018-04-25 DIAGNOSIS — Z79890 Hormone replacement therapy (postmenopausal): Secondary | ICD-10-CM

## 2018-04-25 DIAGNOSIS — E785 Hyperlipidemia, unspecified: Secondary | ICD-10-CM | POA: Diagnosis present

## 2018-04-25 DIAGNOSIS — A498 Other bacterial infections of unspecified site: Secondary | ICD-10-CM | POA: Diagnosis not present

## 2018-04-25 DIAGNOSIS — Z888 Allergy status to other drugs, medicaments and biological substances status: Secondary | ICD-10-CM | POA: Diagnosis not present

## 2018-04-25 DIAGNOSIS — M545 Low back pain: Secondary | ICD-10-CM

## 2018-04-25 DIAGNOSIS — K5730 Diverticulosis of large intestine without perforation or abscess without bleeding: Secondary | ICD-10-CM | POA: Diagnosis present

## 2018-04-25 DIAGNOSIS — C801 Malignant (primary) neoplasm, unspecified: Secondary | ICD-10-CM

## 2018-04-25 DIAGNOSIS — C3432 Malignant neoplasm of lower lobe, left bronchus or lung: Secondary | ICD-10-CM | POA: Diagnosis not present

## 2018-04-25 DIAGNOSIS — Z8249 Family history of ischemic heart disease and other diseases of the circulatory system: Secondary | ICD-10-CM

## 2018-04-25 DIAGNOSIS — E7800 Pure hypercholesterolemia, unspecified: Secondary | ICD-10-CM | POA: Diagnosis present

## 2018-04-25 DIAGNOSIS — Z7401 Bed confinement status: Secondary | ICD-10-CM | POA: Diagnosis not present

## 2018-04-25 DIAGNOSIS — E875 Hyperkalemia: Secondary | ICD-10-CM | POA: Diagnosis not present

## 2018-04-25 DIAGNOSIS — M1611 Unilateral primary osteoarthritis, right hip: Secondary | ICD-10-CM | POA: Diagnosis not present

## 2018-04-25 DIAGNOSIS — E222 Syndrome of inappropriate secretion of antidiuretic hormone: Secondary | ICD-10-CM | POA: Diagnosis not present

## 2018-04-25 DIAGNOSIS — K5790 Diverticulosis of intestine, part unspecified, without perforation or abscess without bleeding: Secondary | ICD-10-CM | POA: Diagnosis present

## 2018-04-25 DIAGNOSIS — C7949 Secondary malignant neoplasm of other parts of nervous system: Secondary | ICD-10-CM | POA: Diagnosis present

## 2018-04-25 DIAGNOSIS — Z1624 Resistance to multiple antibiotics: Secondary | ICD-10-CM

## 2018-04-25 DIAGNOSIS — R918 Other nonspecific abnormal finding of lung field: Secondary | ICD-10-CM | POA: Diagnosis not present

## 2018-04-25 DIAGNOSIS — J810 Acute pulmonary edema: Secondary | ICD-10-CM | POA: Diagnosis not present

## 2018-04-25 DIAGNOSIS — I5030 Unspecified diastolic (congestive) heart failure: Secondary | ICD-10-CM | POA: Diagnosis not present

## 2018-04-25 DIAGNOSIS — N390 Urinary tract infection, site not specified: Secondary | ICD-10-CM | POA: Diagnosis not present

## 2018-04-25 DIAGNOSIS — I110 Hypertensive heart disease with heart failure: Secondary | ICD-10-CM | POA: Diagnosis present

## 2018-04-25 DIAGNOSIS — K7689 Other specified diseases of liver: Secondary | ICD-10-CM | POA: Diagnosis not present

## 2018-04-25 DIAGNOSIS — C3490 Malignant neoplasm of unspecified part of unspecified bronchus or lung: Secondary | ICD-10-CM | POA: Diagnosis not present

## 2018-04-25 DIAGNOSIS — R278 Other lack of coordination: Secondary | ICD-10-CM | POA: Diagnosis not present

## 2018-04-25 DIAGNOSIS — H9190 Unspecified hearing loss, unspecified ear: Secondary | ICD-10-CM | POA: Diagnosis present

## 2018-04-25 DIAGNOSIS — C7951 Secondary malignant neoplasm of bone: Secondary | ICD-10-CM | POA: Diagnosis not present

## 2018-04-25 DIAGNOSIS — G9520 Unspecified cord compression: Secondary | ICD-10-CM | POA: Diagnosis present

## 2018-04-25 DIAGNOSIS — I700 Atherosclerosis of aorta: Secondary | ICD-10-CM | POA: Diagnosis not present

## 2018-04-25 DIAGNOSIS — J449 Chronic obstructive pulmonary disease, unspecified: Secondary | ICD-10-CM | POA: Diagnosis present

## 2018-04-25 DIAGNOSIS — R8271 Bacteriuria: Secondary | ICD-10-CM | POA: Diagnosis not present

## 2018-04-25 DIAGNOSIS — C3492 Malignant neoplasm of unspecified part of left bronchus or lung: Secondary | ICD-10-CM | POA: Diagnosis not present

## 2018-04-25 DIAGNOSIS — E871 Hypo-osmolality and hyponatremia: Secondary | ICD-10-CM

## 2018-04-25 DIAGNOSIS — D491 Neoplasm of unspecified behavior of respiratory system: Secondary | ICD-10-CM | POA: Diagnosis not present

## 2018-04-25 DIAGNOSIS — M549 Dorsalgia, unspecified: Secondary | ICD-10-CM

## 2018-04-25 DIAGNOSIS — G9341 Metabolic encephalopathy: Secondary | ICD-10-CM | POA: Diagnosis not present

## 2018-04-25 DIAGNOSIS — J90 Pleural effusion, not elsewhere classified: Secondary | ICD-10-CM | POA: Diagnosis not present

## 2018-04-25 DIAGNOSIS — F419 Anxiety disorder, unspecified: Secondary | ICD-10-CM | POA: Diagnosis not present

## 2018-04-25 DIAGNOSIS — M84550D Pathological fracture in neoplastic disease, pelvis, subsequent encounter for fracture with routine healing: Secondary | ICD-10-CM | POA: Diagnosis present

## 2018-04-25 DIAGNOSIS — M5136 Other intervertebral disc degeneration, lumbar region: Secondary | ICD-10-CM | POA: Diagnosis not present

## 2018-04-25 DIAGNOSIS — K5909 Other constipation: Secondary | ICD-10-CM | POA: Diagnosis present

## 2018-04-25 DIAGNOSIS — N3000 Acute cystitis without hematuria: Secondary | ICD-10-CM | POA: Diagnosis not present

## 2018-04-25 DIAGNOSIS — R2681 Unsteadiness on feet: Secondary | ICD-10-CM | POA: Diagnosis not present

## 2018-04-25 DIAGNOSIS — G8929 Other chronic pain: Secondary | ICD-10-CM

## 2018-04-25 LAB — COMPREHENSIVE METABOLIC PANEL
ALBUMIN: 3.8 g/dL (ref 3.5–5.2)
ALK PHOS: 173 U/L — AB (ref 39–117)
ALT: 15 U/L (ref 0–35)
AST: 40 U/L — AB (ref 0–37)
BUN: 15 mg/dL (ref 6–23)
CO2: 26 mEq/L (ref 19–32)
CREATININE: 0.71 mg/dL (ref 0.40–1.20)
Calcium: 9.1 mg/dL (ref 8.4–10.5)
Chloride: 88 mEq/L — ABNORMAL LOW (ref 96–112)
GFR: 84.35 mL/min (ref >60.00)
GLUCOSE: 102 mg/dL — AB (ref 70–99)
Potassium: 4.1 mEq/L (ref 3.5–5.1)
SODIUM: 120 meq/L — AB (ref 135–145)
TOTAL PROTEIN: 6.8 g/dL (ref 6.0–8.3)
Total Bilirubin: 0.4 mg/dL (ref 0.2–1.2)

## 2018-04-25 LAB — CBC WITH DIFFERENTIAL/PLATELET
Abs Immature Granulocytes: 0.04 10*3/uL (ref 0.00–0.07)
Basophils Absolute: 0.1 10*3/uL (ref 0.0–0.1)
Basophils Relative: 1 %
EOS PCT: 3 %
Eosinophils Absolute: 0.2 10*3/uL (ref 0.0–0.5)
HCT: 30.8 % — ABNORMAL LOW (ref 36.0–46.0)
HEMOGLOBIN: 10.2 g/dL — AB (ref 12.0–15.0)
Immature Granulocytes: 1 %
LYMPHS PCT: 19 %
Lymphs Abs: 1.3 10*3/uL (ref 0.7–4.0)
MCH: 28.6 pg (ref 26.0–34.0)
MCHC: 33.1 g/dL (ref 30.0–36.0)
MCV: 86.3 fL (ref 80.0–100.0)
MONO ABS: 0.7 10*3/uL (ref 0.1–1.0)
MONOS PCT: 10 %
Neutro Abs: 4.7 10*3/uL (ref 1.7–7.7)
Neutrophils Relative %: 66 %
Platelets: 412 10*3/uL — ABNORMAL HIGH (ref 150–400)
RBC: 3.57 MIL/uL — AB (ref 3.87–5.11)
RDW: 13.4 % (ref 11.5–15.5)
WBC: 7 10*3/uL (ref 4.0–10.5)
nRBC: 0 % (ref 0.0–0.2)

## 2018-04-25 LAB — POC URINALSYSI DIPSTICK (AUTOMATED)
Bilirubin, UA: NEGATIVE
Blood, UA: NEGATIVE
GLUCOSE UA: NEGATIVE
Ketones, UA: NEGATIVE
Leukocytes, UA: NEGATIVE
NITRITE UA: NEGATIVE
PH UA: 6 (ref 5.0–8.0)
PROTEIN UA: NEGATIVE
Spec Grav, UA: 1.015 (ref 1.010–1.025)
UROBILINOGEN UA: 0.2 U/dL

## 2018-04-25 LAB — BASIC METABOLIC PANEL
ANION GAP: 10 (ref 5–15)
ANION GAP: 9 (ref 5–15)
BUN: 13 mg/dL (ref 8–23)
BUN: 12 mg/dL (ref 8–23)
CO2: 23 mmol/L (ref 22–32)
CO2: 24 mmol/L (ref 22–32)
Calcium: 9 mg/dL (ref 8.9–10.3)
Calcium: 8.7 mg/dL — ABNORMAL LOW (ref 8.9–10.3)
Chloride: 90 mmol/L — ABNORMAL LOW (ref 98–111)
Chloride: 92 mmol/L — ABNORMAL LOW (ref 98–111)
Creatinine, Ser: 0.6 mg/dL (ref 0.44–1.00)
Creatinine, Ser: 0.62 mg/dL (ref 0.44–1.00)
GLUCOSE: 97 mg/dL (ref 70–99)
GLUCOSE: 94 mg/dL (ref 70–99)
POTASSIUM: 4 mmol/L (ref 3.5–5.1)
POTASSIUM: 3.9 mmol/L (ref 3.5–5.1)
SODIUM: 123 mmol/L — AB (ref 135–145)
Sodium: 125 mmol/L — ABNORMAL LOW (ref 135–145)

## 2018-04-25 LAB — CBC
HCT: 30.1 % — ABNORMAL LOW (ref 36.0–46.0)
Hemoglobin: 10.4 g/dL — ABNORMAL LOW (ref 12.0–15.0)
MCHC: 34.7 g/dL (ref 30.0–36.0)
MCV: 83.7 fl (ref 78.0–100.0)
Platelets: 433 10*3/uL — ABNORMAL HIGH (ref 150.0–400.0)
RBC: 3.6 Mil/uL — AB (ref 3.87–5.11)
RDW: 14.1 % (ref 11.5–15.5)
WBC: 6.8 10*3/uL (ref 4.0–10.5)

## 2018-04-25 LAB — URINALYSIS, ROUTINE W REFLEX MICROSCOPIC
Bilirubin Urine: NEGATIVE
GLUCOSE, UA: NEGATIVE mg/dL
Hgb urine dipstick: NEGATIVE
Ketones, ur: 5 mg/dL — AB
LEUKOCYTES UA: NEGATIVE
NITRITE: NEGATIVE
PROTEIN: NEGATIVE mg/dL
Specific Gravity, Urine: 1.01 (ref 1.005–1.030)
pH: 7 (ref 5.0–8.0)

## 2018-04-25 LAB — SODIUM, URINE, RANDOM: SODIUM UR: 81 mmol/L

## 2018-04-25 LAB — TSH: TSH: 5.13 u[IU]/mL — ABNORMAL HIGH (ref 0.35–4.50)

## 2018-04-25 LAB — CREATININE, URINE, RANDOM: CREATININE, URINE: 47.67 mg/dL

## 2018-04-25 MED ORDER — SODIUM CHLORIDE 0.9 % IV SOLN
Freq: Once | INTRAVENOUS | Status: AC
  Administered 2018-04-25: 18:00:00 via INTRAVENOUS

## 2018-04-25 MED ORDER — LEVOTHYROXINE SODIUM 75 MCG PO TABS
75.0000 ug | ORAL_TABLET | Freq: Every day | ORAL | Status: DC
  Administered 2018-04-26 – 2018-05-11 (×17): 75 ug via ORAL
  Filled 2018-04-25 (×2): qty 1
  Filled 2018-04-25: qty 3
  Filled 2018-04-25 (×2): qty 1
  Filled 2018-04-25: qty 3
  Filled 2018-04-25: qty 1
  Filled 2018-04-25: qty 3
  Filled 2018-04-25: qty 1
  Filled 2018-04-25 (×3): qty 3
  Filled 2018-04-25: qty 1
  Filled 2018-04-25: qty 3
  Filled 2018-04-25 (×4): qty 1
  Filled 2018-04-25 (×3): qty 3
  Filled 2018-04-25 (×2): qty 1
  Filled 2018-04-25: qty 3
  Filled 2018-04-25: qty 1
  Filled 2018-04-25: qty 3
  Filled 2018-04-25: qty 1
  Filled 2018-04-25: qty 3
  Filled 2018-04-25: qty 1
  Filled 2018-04-25: qty 3

## 2018-04-25 MED ORDER — FENTANYL CITRATE (PF) 100 MCG/2ML IJ SOLN
50.0000 ug | Freq: Once | INTRAMUSCULAR | Status: AC
  Administered 2018-04-25: 50 ug via INTRAVENOUS
  Filled 2018-04-25: qty 2

## 2018-04-25 MED ORDER — SENNA 8.6 MG PO TABS
1.0000 | ORAL_TABLET | Freq: Every day | ORAL | Status: DC | PRN
  Administered 2018-04-25 – 2018-04-28 (×3): 8.6 mg via ORAL
  Filled 2018-04-25 (×3): qty 1

## 2018-04-25 MED ORDER — PANTOPRAZOLE SODIUM 40 MG PO TBEC
40.0000 mg | DELAYED_RELEASE_TABLET | Freq: Every day | ORAL | Status: DC
  Administered 2018-04-26: 40 mg via ORAL
  Filled 2018-04-25: qty 1

## 2018-04-25 MED ORDER — HYDROCODONE-ACETAMINOPHEN 5-325 MG PO TABS: 0.5000 | ORAL_TABLET | Freq: Four times a day (QID) | ORAL | Status: DC | PRN

## 2018-04-25 MED ORDER — HYDROCODONE-ACETAMINOPHEN 5-325 MG PO TABS
1.0000 | ORAL_TABLET | Freq: Four times a day (QID) | ORAL | Status: DC | PRN
  Administered 2018-04-25 – 2018-04-26 (×2): 1 via ORAL
  Filled 2018-04-25 (×2): qty 1

## 2018-04-25 MED ORDER — NITROFURANTOIN MONOHYD MACRO 100 MG PO CAPS
100.0000 mg | ORAL_CAPSULE | Freq: Two times a day (BID) | ORAL | Status: DC
  Administered 2018-04-25: 100 mg via ORAL
  Filled 2018-04-25 (×2): qty 1

## 2018-04-25 MED ORDER — ENOXAPARIN SODIUM 40 MG/0.4ML ~~LOC~~ SOLN
40.0000 mg | SUBCUTANEOUS | Status: DC
  Administered 2018-04-25 – 2018-04-27 (×3): 40 mg via SUBCUTANEOUS
  Filled 2018-04-25 (×3): qty 0.4

## 2018-04-25 MED ORDER — POLYETHYLENE GLYCOL 3350 17 G PO PACK
17.0000 g | PACK | Freq: Every day | ORAL | Status: DC | PRN
  Administered 2018-04-28: 17 g via ORAL
  Filled 2018-04-25: qty 1

## 2018-04-25 NOTE — ED Triage Notes (Signed)
Pt sent from PCP for a sodium level of 120. Pt was seen at PCP for a follow up.

## 2018-04-25 NOTE — Assessment & Plan Note (Signed)
S:Back pain not improving- seeing Dr. Lorin Mercy tomorrow. Walk with walker, cant stand for any length of time due to pain. Doing some leg exercises, got dressed this AM.   Husband has hernia in stomach area and cant help her anymore.   She has not been able to sleep due to pain even despite half a hydrocodone at night. Some constipation- Miralax each Am and sennakot each night. She has had some nausea. A/P: I am concerned there may be something more than orthopedic issue going on- alk phos remains high- hopeful she can get lumbar films in hospital potentially.

## 2018-04-25 NOTE — Telephone Encounter (Signed)
CRITICAL VALUE STICKER  CRITICAL VALUE: Sodium 120.10  RECEIVER (on-site recipient of call): Kayren Eaves  DATE & TIME NOTIFIED: 04/25/18 @ 1535 hrs  MESSENGER (representative from lab): Esperanza Richters   MD NOTIFIED: Yong Channel  TIME OF NOTIFICATION: 0102 HRS  RESPONSE:

## 2018-04-25 NOTE — Assessment & Plan Note (Signed)
S: On thyroid medication-levothyroxine 75 mcg  Lab Results  Component Value Date   TSH 5.13 (H) 04/25/2018   A/P: TSh getting closer to normal with this check- she also may be acutely ill as well- will not make adjustments at this time.

## 2018-04-25 NOTE — Patient Instructions (Addendum)
Please stop by lab before you go   I am ordering labs as urgent with hopes we can get answers tonight or at latest tomorrow morning.

## 2018-04-25 NOTE — ED Notes (Signed)
Pt placed on Purewick, per RN as Pt has a Pelvic fx.

## 2018-04-25 NOTE — Assessment & Plan Note (Signed)
S: patient is Not as clear, not as engaged, forgetting passwords. Last sodium level was 128. She has restricted her fluids down another 5-10 oz per day since last evaluation.  A/P: unfortunately sodium today dropped down to 120 - we will have her admitted for correction and further evaluation.

## 2018-04-25 NOTE — Assessment & Plan Note (Signed)
S: Patient with ESBL E. Coli and was treated with meropenem in hospital and at home recently. Follow up culture shows E. Coli resistant to meropenem. Also had enterobacter. Sensitive to nitrofurantoin and this was sent in for her- she is on 3rd day today. We also referred to ID and has visit next Tuesday.   She still has picc line in place but we are not actively using  Family feels like patient was doing better until PICC line antibiotics stopped last Saturday and she seemed to have worsening confusion since that time. She is not as clear, engaged and she is forgetting things like her passwords at home. We also discussed this could be from hyponatremia A/P: we will get updated urine culture- would prefer to have waited until after nitrofurantoin was finished but feel we need to move forward with evaluation given her lack of improvement- in addition cultures are with [facility] and not in epic and would be great to have those records in epic moving forward.

## 2018-04-25 NOTE — Telephone Encounter (Signed)
Dr. Yong Channel advised for patient to go to the hospital. I called and spoke with patient's son Abe People who verbalized understanding. He is going to take her.

## 2018-04-25 NOTE — Telephone Encounter (Signed)
Called son Abe People and advised him that per Dr. Yong Channel he needs to take his mom to the hospital as her sodium level is at 120. He verbalized understanding

## 2018-04-25 NOTE — H&P (Addendum)
History and Physical    ZURIA FOSDICK EGB:[id number] DOB: 02-23-1941 DOA: 04/25/2018  PCP: Marin Olp, MD Patient coming from: Home  Chief Complaint: Sent by PCP for evaluation of abnormal lab  HPI: [name] [name] is a 77 y.o. female with medical history significant of COPD, chronic low back pain, pubic fracture, recent admissions on October 8 in October 14 for hyponatremia presenting to the hospital from her PCPs office for evaluation of hyponatremia (sodium 120).  Patient states she does not drink too much fluid at home.  Reports drinking two 22 ounce bottles of water per day.  However, she requested water multiple times while we were talking. Patient denies having any headaches or seizures.  Son believes she has been more forgetful the last few days but not confused.  States patient went to her PCP 2 days ago and the repeat urine culture continued to grow E. coli and the patient was started on Macrobid.  She has an appointment with infectious disease next week.    During her recent admission from April 01, 2018 to April 07, 2018 nephrology was consulted and they recommended water restriction and Lasix for hypervolemic hyponatremia, which improved her sodium level.  Urine culture showed ESBL E. coli and a PICC line was placed.  She was started on meropenem.  Stop date April 13, 2018.  ED Course: Vitals stable; afebrile.  Sodium checked in the ED 123.  No leukocytosis.  UA not suggestive of infection.  Patient was started on normal saline infusion in the ED. [facility] paged to admit.   Review of Systems: As per HPI otherwise 10 point review of systems negative.  Past Medical History:  Diagnosis Date  . Asymptomatic varicose veins   . Cataract    beginnings  . Cigarette smoker   . COPD (chronic obstructive pulmonary disease) ([place])   . Diverticulosis of colon (without mention of hemorrhage)   . Headache(784.0)   . Hearing loss    wears hearing aides  . Lumbar back pain   .  Pure hypercholesterolemia   . Stress disorder, acute   . Varicose veins     Past Surgical History:  Procedure Laterality Date  . COLONOSCOPY  04-22-2004  . LUMBAR SPINE SURGERY     [phone]  . varicose vein treatment     at [facility]     reports that she has been smoking cigarettes. She has a 20.00 pack-year smoking history. She has never used smokeless tobacco. She reports that she does not drink alcohol or use drugs.  Allergies  Allergen Reactions  . Atorvastatin     Intolerant to all statins   . Cyclobenzaprine Other (See Comments)    Bradycardia, hypotension  . Ezetimibe      INTOL to Zetia  . Rosuvastatin     intolerant to all statins  . Gadolinium Derivatives Nausea Only    Pt became very nauseous after gadolinium administration//lh  . Nicoderm [Nicotine] Rash    Unable to use the patches---caused severe rash to area placed    Family History  Problem Relation Age of Onset  . Diabetes Father   . Heart attack Mother 42       nonsmoker  . Colon cancer Neg Hx   . Rectal cancer Neg Hx   . Stomach cancer Neg Hx     Prior to Admission medications   Medication Sig Start Date End Date Taking? Authorizing Provider  HYDROcodone-acetaminophen (NORCO/VICODIN) 5-325 MG tablet Take 0.5 tablets by  mouth every 6 (six) hours as needed for moderate pain or severe pain.   Yes [provider]  levothyroxine (SYNTHROID, LEVOTHROID) 75 MCG tablet TAKE 1 TABLET(75 MCG) BY MOUTH DAILY Patient taking differently: Take 75 mcg by mouth daily before breakfast.  04/03/18  Yes Marin Olp, MD  nitrofurantoin, macrocrystal-monohydrate, (MACROBID) 100 MG capsule Take 1 capsule (100 mg total) by mouth 2 (two) times daily for 7 days. 04/22/18 04/29/18 Yes Marin Olp, MD  omeprazole (PRILOSEC) 20 MG capsule Take 1 capsule (20 mg total) by mouth daily. 01/28/18  Yes Inda Coke, PA  senna (SENOKOT) 8.6 MG TABS tablet Take 1 tablet (8.6 mg total) by mouth 2 (two)  times daily. 03/26/18  Yes Patrecia Pour, MD  nicotine polacrilex (NICORETTE) 2 MG gum Take 1 each (2 mg total) by mouth as needed for smoking cessation. Patient not taking: Reported on 04/25/2018 03/26/18   Patrecia Pour, MD  ondansetron (ZOFRAN-ODT) 4 MG disintegrating tablet Take 1 tablet (4 mg total) by mouth every 8 (eight) hours as needed for nausea or vomiting. 03/04/18   Marin Olp, MD  polyethylene glycol (MIRALAX / Floria Raveling) packet Take 17 g by mouth daily. Patient taking differently: Take 17 g by mouth daily as needed for mild constipation.  03/26/18   Patrecia Pour, MD    Physical Exam: Vitals:   04/25/18 2000 04/25/18 2030 04/25/18 2100 04/25/18 2130  BP: 132/79 120/76 133/82 (!) 142/85  Pulse: 83 77 82 84  Resp: 18 16 17 16   Temp:      TempSrc:      SpO2: 96% 92% 96% 95%  Weight:      Height:        Physical Exam  Constitutional: She appears well-developed and well-nourished. No distress.  Resting comfortably in a hospital stretcher  HENT:  Head: Normocephalic.  Mouth/Throat: Oropharynx is clear and moist.  Eyes: Right eye exhibits no discharge. Left eye exhibits no discharge.  Neck: Neck supple. No tracheal deviation present.  Cardiovascular: Normal rate, regular rhythm and intact distal pulses.  Pulmonary/Chest: Effort normal and breath sounds normal. No respiratory distress. She has no wheezes. She has no rales.  Abdominal: Soft. Bowel sounds are normal. She exhibits no distension. There is no tenderness.  Musculoskeletal:  +1 bilateral lower extremity edema  Neurological:  AAO x4  Skin: Skin is warm and dry. She is not diaphoretic.  Psychiatric: She has a normal mood and affect. Her behavior is normal.     Labs on Admission: I have personally reviewed following labs and imaging studies  CBC: Recent Labs  Lab 04/19/18 04/25/18 1402 04/25/18 1810  WBC 9.7 6.8 7.0  NEUTROABS 7  --  4.7  HGB 10.4* 10.4* 10.2*  HCT 30* 30.1* 30.8*  MCV  --  83.7 86.3   PLT 531* 433.0* 646*   Basic Metabolic Panel: Recent Labs  Lab 04/19/18 04/25/18 1402 04/25/18 1810  NA 128* 120* 123*  K 3.9 4.1 4.0  CL  --  88* 90*  CO2  --  26 23  GLUCOSE  --  102* 97  BUN 11 15 13   CREATININE 0.6 0.71 0.60  CALCIUM  --  9.1 9.0   GFR: Estimated Creatinine Clearance: 55.5 mL/min (by C-G formula based on SCr of 0.6 mg/dL). Liver Function Tests: Recent Labs  Lab 04/19/18 04/25/18 1402  AST 44* 40*  ALT 15 15  ALKPHOS 206* 173*  BILITOT  --  0.4  PROT  --  6.8  ALBUMIN  --  3.8   No results for input(s): LIPASE, AMYLASE in the last 168 hours. No results for input(s): AMMONIA in the last 168 hours. Coagulation Profile: No results for input(s): INR, PROTIME in the last 168 hours. Cardiac Enzymes: No results for input(s): CKTOTAL, CKMB, CKMBINDEX, TROPONINI in the last 168 hours. BNP (last 3 results) No results for input(s): PROBNP in the last 8760 hours. HbA1C: No results for input(s): HGBA1C in the last 72 hours. CBG: No results for input(s): GLUCAP in the last 168 hours. Lipid Profile: No results for input(s): CHOL, HDL, LDLCALC, TRIG, CHOLHDL, LDLDIRECT in the last 72 hours. Thyroid Function Tests: Recent Labs    04/25/18 1402  TSH 5.13*   Anemia Panel: No results for input(s): VITAMINB12, FOLATE, FERRITIN, TIBC, IRON, RETICCTPCT in the last 72 hours. Urine analysis:    Component Value Date/Time   COLORURINE YELLOW 04/25/2018 1922   APPEARANCEUR CLEAR 04/25/2018 1922   LABSPEC 1.010 04/25/2018 1922   PHURINE 7.0 04/25/2018 1922   GLUCOSEU NEGATIVE 04/25/2018 1922   GLUCOSEU NEGATIVE 01/28/2018 1055   HGBUR NEGATIVE 04/25/2018 1922   BILIRUBINUR NEGATIVE 04/25/2018 1922   BILIRUBINUR Negative 04/25/2018 1432   KETONESUR 5 (A) 04/25/2018 1922   PROTEINUR NEGATIVE 04/25/2018 1922   UROBILINOGEN 0.2 04/25/2018 1432   UROBILINOGEN 0.2 01/28/2018 1055   NITRITE NEGATIVE 04/25/2018 1922   LEUKOCYTESUR NEGATIVE 04/25/2018 1922     Radiological Exams on Admission: No results found.  Assessment/Plan Active Problems:   Hyponatremia   UNABLE to update problem list as patient's chart is locked at her PCPs office (closed at this time).   Acute on chronic hyponatremia Sodium 120 at PCPs office.  Repeat checked in the ED was 123.  Appears slightly volume overloaded (+1 b/l lower extremity edema).  No change in mental status (AAO x4). Nephrology was consulted during her previous hospitalization in October and thought her presentation was secondary to hypervolemic hyponatremia.  Water restriction and Lasix were recommended at that time.  Patient was not discharged home with Lasix. -Fluid restriction (800 cc/day) as recommended by nephrology previously -Patient received normal saline infusion in the ED.  Will repeat BMP at this time to recheck sodium level.   -Consult nephrology in the morning -Check serum osmolarity, urine osmolarity, urine sodium, urine creatinine -Give Lasix if labs are consistent with hypervolemic hyponatremia -Repeat BMP again in a.m.  History of ESBL E. coli UTI.  PICC line placed and patient started on meropenem during her recent hospitalization.  Stop date April 13, 2018.  Per PCP note from today, follow-up urine culture revealed E. coli resistant to meropenem.  Also had Enterobacter.  Sensitive to nitrofurantoin and patient was started on this medication.  ID appointment next Tuesday.  Patient is afebrile and does not have leukocytosis.  Nontoxic-appearing.  UA not suggestive of infection. -Continue Macrobid at this time -Repeat urine culture -Consult ID in the morning  Hypertension -Blood pressure stable.  Currently not on any home medications. -Continue to monitor blood pressure  Hypothyroidism TSH 6.99 during her recent hospitalization.  TSH checked today at PCPs office 5.13. -Continue Synthroid 75 mcg daily.  Seen by PCP today.   Chronic anemia -Hemoglobin 10.4, stable.  Chronic  deconditioning/subacute pubic ramus fracture -PT consult  Chronic low back pain -Continue home Norco 5-325 1 tablet every 6 hours as needed  GERD -Continue PPI  Chronic constipation -Continue home MiraLAX as needed, senna as needed  DVT prophylaxis: Lovenox Code Status: Patient wishes  to be full code. Family Communication: Son at bedside updated. Disposition Plan: Anticipate discharge to home in 1 to 2 days. Consults called: None Admission status: Observation   Shela Leff MD [facility] Pager [phone]  If 7PM-7AM, please contact night-coverage [email or web address] Password [facility]  04/25/2018, 9:58 PM

## 2018-04-25 NOTE — ED Notes (Addendum)
Pt stated to writer she needed pain meds. Writer informed Museum/gallery conservator, Therapist, sports.

## 2018-04-25 NOTE — ED Provider Notes (Addendum)
[facility] DEPT Provider Note   CSN: [id number] Arrival date & time: 04/25/18  1630     History   Chief Complaint Chief Complaint  Patient presents with  . Abnormal Lab    HPI [name] [name] is a 77 y.o. female.  Recent history of hyponatremia.  Patient went to her primary care doctor and her sodium was rechecked recently and it was "120".  She was sent to the emergency department for further evaluation.  Additionally, she has a PICC line in her right arm for "UTI secondary to E. coli".  She has a pending ID consult for this Tuesday.  She has had 2 recent admissions for hyponatremia.  Severity of symptoms is moderate.  Nothing makes him better or worse.     Past Medical History:  Diagnosis Date  . Asymptomatic varicose veins   . Cataract    beginnings  . Cigarette smoker   . COPD (chronic obstructive pulmonary disease) ([place])   . Diverticulosis of colon (without mention of hemorrhage)   . Headache(784.0)   . Hearing loss    wears hearing aides  . Lumbar back pain   . Pure hypercholesterolemia   . Stress disorder, acute   . Varicose veins     Patient Active Problem List   Diagnosis Date Noted  . Liver mass 04/10/2018  . Aortic atherosclerosis ([place]) 04/10/2018  . Elevated BP without diagnosis of hypertension 04/05/2018  . Acute lower UTI 04/01/2018  . Constipation 04/01/2018  . Hyponatremia 03/23/2018  . Debility 03/23/2018  . Pubic ramus fracture ([place]) 03/23/2018  . Primary osteoarthritis of right hip 02/07/2018  . Belching 02/01/2018  . Acquired trigger finger of right middle finger 07/20/2017  . Varicose veins 12/02/2013  . OA (osteoarthritis) 08/15/2012  . Hearing loss 08/16/2009  . Hypothyroidism 10/12/2007  . CIGARETTE SMOKER 10/09/2007  . HYPERCHOLESTEROLEMIA 10/08/2007  . COPD (chronic obstructive pulmonary disease) ([place]) 10/08/2007  . BACK PAIN, LUMBAR 10/08/2007    Past Surgical History:  Procedure Laterality  Date  . COLONOSCOPY  04-22-2004  . LUMBAR SPINE SURGERY     [phone]  . varicose vein treatment     at [place] [facility]     OB History   None      Home Medications    Prior to Admission medications   Medication Sig Start Date End Date Taking? Authorizing Provider  HYDROcodone-acetaminophen (NORCO/VICODIN) 5-325 MG tablet Take 0.5 tablets by mouth every 6 (six) hours as needed for moderate pain or severe pain.   Yes [provider]  levothyroxine (SYNTHROID, LEVOTHROID) 75 MCG tablet TAKE 1 TABLET(75 MCG) BY MOUTH DAILY Patient taking differently: Take 75 mcg by mouth daily before breakfast.  04/03/18  Yes Marin Olp, MD  nitrofurantoin, macrocrystal-monohydrate, (MACROBID) 100 MG capsule Take 1 capsule (100 mg total) by mouth 2 (two) times daily for 7 days. 04/22/18 04/29/18 Yes Marin Olp, MD  omeprazole (PRILOSEC) 20 MG capsule Take 1 capsule (20 mg total) by mouth daily. 01/28/18  Yes Inda Coke, PA  senna (SENOKOT) 8.6 MG TABS tablet Take 1 tablet (8.6 mg total) by mouth 2 (two) times daily. 03/26/18  Yes Patrecia Pour, MD  nicotine polacrilex (NICORETTE) 2 MG gum Take 1 each (2 mg total) by mouth as needed for smoking cessation. Patient not taking: Reported on 04/25/2018 03/26/18   Patrecia Pour, MD  ondansetron (ZOFRAN-ODT) 4 MG disintegrating tablet Take 1 tablet (4 mg total) by mouth every 8 (eight) hours  as needed for nausea or vomiting. 03/04/18   Marin Olp, MD  polyethylene glycol [facility] / Floria Raveling) packet Take 17 g by mouth daily. Patient taking differently: Take 17 g by mouth daily as needed for mild constipation.  03/26/18   Patrecia Pour, MD    Family History Family History  Problem Relation Age of Onset  . Diabetes Father   . Heart attack Mother 39       nonsmoker  . Colon cancer Neg Hx   . Rectal cancer Neg Hx   . Stomach cancer Neg Hx     Social History Social History   Tobacco Use  . Smoking status: Current Every Day  Smoker    Packs/day: 0.50    Years: 40.00    Pack years: 20.00    Types: Cigarettes  . Smokeless tobacco: Never Used  . Tobacco comment: 1/2-3/4 of a pk a day   Substance Use Topics  . Alcohol use: No    Alcohol/week: 0.0 standard drinks  . Drug use: No     Allergies   Atorvastatin; Cyclobenzaprine; Ezetimibe; Rosuvastatin; Gadolinium derivatives; and Nicoderm [nicotine]   Review of Systems Review of Systems  All other systems reviewed and are negative.    Physical Exam Updated Vital Signs BP (!) 141/88 (BP Location: Left Arm)   Pulse 82   Temp 97.7 F (36.5 C) (Oral)   Resp 18   Ht 5\' 7"  (1.702 m)   Wt 67.2 kg   SpO2 97%   BMI 23.22 kg/m   Physical Exam  Constitutional: She is oriented to person, place, and time. She appears well-developed and well-nourished.  HENT:  Head: Normocephalic and atraumatic.  Eyes: Conjunctivae are normal.  Neck: Neck supple.  Cardiovascular: Normal rate and regular rhythm.  Pulmonary/Chest: Effort normal and breath sounds normal.  Abdominal: Soft. Bowel sounds are normal.  Musculoskeletal: Normal range of motion.  Pelvic pain with rocking  Neurological: She is alert and oriented to person, place, and time.  Skin: Skin is warm and dry.  Psychiatric: She has a normal mood and affect. Her behavior is normal.  Nursing note and vitals reviewed.    ED Treatments / Results  Labs (all labs ordered are listed, but only abnormal results are displayed) Labs Reviewed  CBC WITH DIFFERENTIAL/PLATELET - Abnormal; Notable for the following components:      Result Value   RBC 3.57 (*)    Hemoglobin 10.2 (*)    HCT 30.8 (*)    Platelets 412 (*)    All other components within normal limits  BASIC METABOLIC PANEL - Abnormal; Notable for the following components:   Sodium 123 (*)    Chloride 90 (*)    All other components within normal limits  URINALYSIS, ROUTINE W REFLEX MICROSCOPIC - Abnormal; Notable for the following components:    Ketones, ur 5 (*)    All other components within normal limits    EKG None  Radiology No results found.  Procedures Procedures (including critical care time)  Medications Ordered in ED Medications  0.9 %  sodium chloride infusion ( Intravenous New Bag/Given 04/25/18 1812)  fentaNYL (SUBLIMAZE) injection 50 mcg (50 mcg Intravenous Given 04/25/18 1958)     Initial Impression / Assessment and Plan / ED Course  I have reviewed the triage vital signs and the nursing notes.  Pertinent labs & imaging results that were available during my care of the patient were reviewed by me and considered in my medical decision making (  see chart for details).     Patient presents with hyponatremia.  Otherwise, she is hemodynamically stable. Will consult general medicine.  Final Clinical Impressions(s) / ED Diagnoses   Final diagnoses:  Hyponatremia    ED Discharge Orders    None       Nat Christen, MD 04/25/18 Karl Bales    Nat Christen, MD 04/25/18 2048

## 2018-04-25 NOTE — Progress Notes (Signed)
VAST RN called to pt's bedside to declot PICC. Spoke to unit RN who stated triage gave report that line would not draw back.  Pt's son reported line stopped drawing back earlier in the week when the home health nurse assessed it.  Upon assessing R arm SL PICC, VAST RN noted there was no cap on line. VAST RN asked pt's son who last accessed line; he stated he did, as his Mom was receiving abx 3 times a day at home. VAST RN questioned about cap missing from end of line. Son stated his Mom's line has never had a cap on it and that home health nurse did not say anything to him, nor did he learn that when being taught to care for line. VAST RN educated that PICC should ALWAYS have a needless cap of some sort on end of line to help prevent infection. Son verbalized understanding. VAST RN vigorously scrubbed end of PICC with chlorhexadine for 20 secs before placing cap on line. GBR obtained and line easily flushed with 10 mLs NS.  VAST RN shared information with ER nurse.

## 2018-04-25 NOTE — Progress Notes (Signed)
Subjective:  [name] [name] is a 77 y.o. year old very pleasant female patient who presents for/with See problem oriented charting ROS-family has noted more confusion.  She denies abdominal pain.  She denies confusion.  She is not eating as well.  She is restricting her fluids as directed.  No chest pain or shortness of breath reported.  Past Medical History-  Patient Active Problem List   Diagnosis Date Noted  . Carbapenem-resistant bacterial infection 04/25/2018    Priority: High  . Liver mass 04/10/2018    Priority: High  . Constipation 04/01/2018    Priority: High  . Hyponatremia 03/23/2018    Priority: High  . CIGARETTE SMOKER 10/09/2007    Priority: High  . BACK PAIN, LUMBAR 10/08/2007    Priority: High  . Acute lower UTI 04/01/2018    Priority: Medium  . Primary osteoarthritis of right hip 02/07/2018    Priority: Medium  . Belching 02/01/2018    Priority: Medium  . Hypothyroidism 10/12/2007    Priority: Medium  . HYPERCHOLESTEROLEMIA 10/08/2007    Priority: Medium  . COPD (chronic obstructive pulmonary disease) ([place]) 10/08/2007    Priority: Medium  . Elevated BP without diagnosis of hypertension 04/05/2018    Priority: Low  . Pubic ramus fracture ([facility]) 03/23/2018    Priority: Low  . Varicose veins 12/02/2013    Priority: Low  . OA (osteoarthritis) 08/15/2012    Priority: Low  . Hearing loss 08/16/2009    Priority: Low  . Aortic atherosclerosis ([place]) 04/10/2018  . Debility 03/23/2018  . Acquired trigger finger of right middle finger 07/20/2017    Medications- reviewed and updated No current facility-administered medications for this visit.    No current outpatient medications on file.   Facility-Administered Medications Ordered in Other Visits  Medication Dose Route Frequency Provider Last Rate Last Dose  . enoxaparin (LOVENOX) injection 40 mg  40 mg Subcutaneous Q24H Shela Leff, MD      . HYDROcodone-acetaminophen (NORCO/VICODIN) 5-325 MG per  tablet 0.5 tablet  0.5 tablet Oral Q6H PRN Shela Leff, MD      . Derrill Memo ON 04/26/2018] levothyroxine (SYNTHROID, LEVOTHROID) tablet 75 mcg  75 mcg Oral QAC breakfast Shela Leff, MD      . nitrofurantoin (macrocrystal-monohydrate) (MACROBID) capsule 100 mg  100 mg Oral BID Shela Leff, MD      . Derrill Memo ON 04/26/2018] pantoprazole (PROTONIX) EC tablet 40 mg  40 mg Oral Daily Shela Leff, MD      . polyethylene glycol (MIRALAX / GLYCOLAX) packet 17 g  17 g Oral Daily PRN Shela Leff, MD      . senna (SENOKOT) tablet 8.6 mg  1 tablet Oral Daily PRN Shela Leff, MD       Objective: BP 129/69 (BP Location: Left Arm, Patient Position: Sitting, Cuff Size: Normal)   Pulse 86   Temp 98.2 F (36.8 C) (Oral)   Ht '5\' 7"'  (1.702 m)   Wt 148 lb 4 oz (67.2 kg)   SpO2 95%   BMI 23.22 kg/m  Gen: NAD, resting comfortably CV: RRR no murmurs rubs or gallops Lungs: CTAB no crackles, wheeze, rhonchi Abdomen: soft/nontender/nondistended/normal bowel sounds.  Ext: 1+ edema Skin: warm, dry Neuro: In wheelchair today  Assessment/Plan:  Carbapenem-resistant bacterial infection S: Patient with ESBL E. Coli and was treated with meropenem in hospital and at home recently. Follow up culture shows E. Coli resistant to meropenem. Also had enterobacter. Sensitive to nitrofurantoin and this was sent in for her- she  is on 3rd day today. We also referred to ID and has visit next Tuesday.   She still has picc line in place but we are not actively using  Family feels like patient was doing better until PICC line antibiotics stopped last Saturday and she seemed to have worsening confusion since that time. She is not as clear, engaged and she is forgetting things like her passwords at home. We also discussed this could be from hyponatremia A/P: we will get updated urine culture- would prefer to have waited until after nitrofurantoin was finished but feel we need to move forward with  evaluation given her lack of improvement- in addition cultures are with [facility] and not in epic and would be great to have those records in epic moving forward.    Hypothyroidism S: On thyroid medication-levothyroxine 75 mcg  Lab Results  Component Value Date   TSH 5.13 (H) 04/25/2018   A/P: TSh getting closer to normal with this check- she also may be acutely ill as well- will not make adjustments at this time.     Hyponatremia S: patient is Not as clear, not as engaged, forgetting passwords. Last sodium level was 128. She has restricted her fluids down another 5-10 oz per day since last evaluation.  A/P: unfortunately sodium today dropped down to 120 - we will have her admitted for correction and further evaluation.    BACK PAIN, LUMBAR S:Back pain not improving- seeing Dr. Lorin Mercy tomorrow. Walk with walker, cant stand for any length of time due to pain. Doing some leg exercises, got dressed this AM.   Husband has hernia in stomach area and cant help her anymore.   She has not been able to sleep due to pain even despite half a hydrocodone at night. Some constipation- Miralax each Am and sennakot each night. She has had some nausea. A/P: I am concerned there may be something more than orthopedic issue going on- alk phos remains high- hopeful she can get lumbar films in hospital potentially.   Future Appointments  Date Time Provider [place]  04/30/2018 10:00 AM Michel Bickers, MD [facility]   Lab/Order associations: Hyponatremia - Plan: Comprehensive metabolic panel, CANCELED: Comprehensive metabolic panel  Hypothyroidism, unspecified type - Plan: TSH  HYPERCHOLESTEROLEMIA - Plan: CBC, CANCELED: CBC  Acute cystitis without hematuria - Plan: POCT Urinalysis Dipstick (Automated), Urine Culture  Carbapenem-resistant bacterial infection  Chronic bilateral low back pain without sciatica  Return precautions advised.  [name], MD

## 2018-04-26 ENCOUNTER — Other Ambulatory Visit: Payer: Self-pay

## 2018-04-26 ENCOUNTER — Ambulatory Visit (INDEPENDENT_AMBULATORY_CARE_PROVIDER_SITE_OTHER): Payer: Medicare Other | Admitting: Orthopaedic Surgery

## 2018-04-26 DIAGNOSIS — H9190 Unspecified hearing loss, unspecified ear: Secondary | ICD-10-CM | POA: Diagnosis present

## 2018-04-26 DIAGNOSIS — K5730 Diverticulosis of large intestine without perforation or abscess without bleeding: Secondary | ICD-10-CM | POA: Diagnosis present

## 2018-04-26 DIAGNOSIS — C801 Malignant (primary) neoplasm, unspecified: Secondary | ICD-10-CM | POA: Diagnosis not present

## 2018-04-26 DIAGNOSIS — G9341 Metabolic encephalopathy: Secondary | ICD-10-CM | POA: Diagnosis not present

## 2018-04-26 DIAGNOSIS — J810 Acute pulmonary edema: Secondary | ICD-10-CM | POA: Diagnosis not present

## 2018-04-26 DIAGNOSIS — I700 Atherosclerosis of aorta: Secondary | ICD-10-CM | POA: Diagnosis present

## 2018-04-26 DIAGNOSIS — R8271 Bacteriuria: Secondary | ICD-10-CM

## 2018-04-26 DIAGNOSIS — C7951 Secondary malignant neoplasm of bone: Secondary | ICD-10-CM | POA: Diagnosis present

## 2018-04-26 DIAGNOSIS — E7800 Pure hypercholesterolemia, unspecified: Secondary | ICD-10-CM | POA: Diagnosis present

## 2018-04-26 DIAGNOSIS — Z79890 Hormone replacement therapy (postmenopausal): Secondary | ICD-10-CM | POA: Diagnosis not present

## 2018-04-26 DIAGNOSIS — M47816 Spondylosis without myelopathy or radiculopathy, lumbar region: Secondary | ICD-10-CM | POA: Diagnosis present

## 2018-04-26 DIAGNOSIS — Z833 Family history of diabetes mellitus: Secondary | ICD-10-CM | POA: Diagnosis not present

## 2018-04-26 DIAGNOSIS — F17210 Nicotine dependence, cigarettes, uncomplicated: Secondary | ICD-10-CM | POA: Diagnosis present

## 2018-04-26 DIAGNOSIS — C7949 Secondary malignant neoplasm of other parts of nervous system: Secondary | ICD-10-CM | POA: Diagnosis present

## 2018-04-26 DIAGNOSIS — I5030 Unspecified diastolic (congestive) heart failure: Secondary | ICD-10-CM | POA: Diagnosis not present

## 2018-04-26 DIAGNOSIS — J449 Chronic obstructive pulmonary disease, unspecified: Secondary | ICD-10-CM | POA: Diagnosis present

## 2018-04-26 DIAGNOSIS — E039 Hypothyroidism, unspecified: Secondary | ICD-10-CM

## 2018-04-26 DIAGNOSIS — Z8249 Family history of ischemic heart disease and other diseases of the circulatory system: Secondary | ICD-10-CM | POA: Diagnosis not present

## 2018-04-26 DIAGNOSIS — M545 Low back pain: Secondary | ICD-10-CM | POA: Diagnosis not present

## 2018-04-26 DIAGNOSIS — C787 Secondary malignant neoplasm of liver and intrahepatic bile duct: Secondary | ICD-10-CM | POA: Diagnosis present

## 2018-04-26 DIAGNOSIS — C3432 Malignant neoplasm of lower lobe, left bronchus or lung: Secondary | ICD-10-CM | POA: Diagnosis not present

## 2018-04-26 DIAGNOSIS — C3492 Malignant neoplasm of unspecified part of left bronchus or lung: Secondary | ICD-10-CM | POA: Diagnosis not present

## 2018-04-26 DIAGNOSIS — D649 Anemia, unspecified: Secondary | ICD-10-CM | POA: Diagnosis present

## 2018-04-26 DIAGNOSIS — R918 Other nonspecific abnormal finding of lung field: Secondary | ICD-10-CM | POA: Diagnosis not present

## 2018-04-26 DIAGNOSIS — E222 Syndrome of inappropriate secretion of antidiuretic hormone: Secondary | ICD-10-CM | POA: Diagnosis present

## 2018-04-26 DIAGNOSIS — S22000A Wedge compression fracture of unspecified thoracic vertebra, initial encounter for closed fracture: Secondary | ICD-10-CM | POA: Diagnosis not present

## 2018-04-26 DIAGNOSIS — E871 Hypo-osmolality and hyponatremia: Secondary | ICD-10-CM | POA: Diagnosis present

## 2018-04-26 DIAGNOSIS — M8458XA Pathological fracture in neoplastic disease, other specified site, initial encounter for fracture: Secondary | ICD-10-CM | POA: Diagnosis present

## 2018-04-26 DIAGNOSIS — I5031 Acute diastolic (congestive) heart failure: Secondary | ICD-10-CM | POA: Diagnosis present

## 2018-04-26 DIAGNOSIS — Z888 Allergy status to other drugs, medicaments and biological substances status: Secondary | ICD-10-CM | POA: Diagnosis not present

## 2018-04-26 DIAGNOSIS — K219 Gastro-esophageal reflux disease without esophagitis: Secondary | ICD-10-CM | POA: Diagnosis present

## 2018-04-26 DIAGNOSIS — Z974 Presence of external hearing-aid: Secondary | ICD-10-CM | POA: Diagnosis not present

## 2018-04-26 DIAGNOSIS — C3490 Malignant neoplasm of unspecified part of unspecified bronchus or lung: Secondary | ICD-10-CM | POA: Diagnosis present

## 2018-04-26 DIAGNOSIS — G9520 Unspecified cord compression: Secondary | ICD-10-CM | POA: Diagnosis present

## 2018-04-26 LAB — URINE CULTURE
MICRO NUMBER: 91342705
RESULT: NO GROWTH
SPECIMEN QUALITY:: ADEQUATE

## 2018-04-26 LAB — BASIC METABOLIC PANEL
ANION GAP: 7 (ref 5–15)
BUN: 10 mg/dL (ref 8–23)
CO2: 25 mmol/L (ref 22–32)
CREATININE: 0.59 mg/dL (ref 0.44–1.00)
Calcium: 8.7 mg/dL — ABNORMAL LOW (ref 8.9–10.3)
Chloride: 93 mmol/L — ABNORMAL LOW (ref 98–111)
GLUCOSE: 92 mg/dL (ref 70–99)
Potassium: 4 mmol/L (ref 3.5–5.1)
Sodium: 125 mmol/L — ABNORMAL LOW (ref 135–145)

## 2018-04-26 LAB — OSMOLALITY: Osmolality: 260 mOsm/kg — ABNORMAL LOW (ref 275–295)

## 2018-04-26 LAB — OSMOLALITY, URINE: Osmolality, Ur: 360 mOsm/kg (ref 300–900)

## 2018-04-26 MED ORDER — FUROSEMIDE 20 MG PO TABS: 20.0000 mg | ORAL_TABLET | Freq: Every day | ORAL | Status: DC

## 2018-04-26 MED ORDER — HYDROMORPHONE HCL 2 MG PO TABS
1.0000 mg | ORAL_TABLET | Freq: Four times a day (QID) | ORAL | Status: DC | PRN
  Administered 2018-04-26 – 2018-04-27 (×3): 1 mg via ORAL
  Filled 2018-04-26 (×3): qty 1

## 2018-04-26 MED ORDER — ACETAMINOPHEN 500 MG PO TABS
1000.0000 mg | ORAL_TABLET | Freq: Three times a day (TID) | ORAL | Status: DC
  Administered 2018-04-26 – 2018-04-28 (×6): 1000 mg via ORAL
  Filled 2018-04-26 (×6): qty 2

## 2018-04-26 MED ORDER — MORPHINE SULFATE (PF) 2 MG/ML IV SOLN
2.0000 mg | Freq: Once | INTRAVENOUS | Status: AC
  Administered 2018-04-26: 2 mg via INTRAVENOUS
  Filled 2018-04-26: qty 1

## 2018-04-26 MED ORDER — SODIUM CHLORIDE 1 G PO TABS
1.0000 g | ORAL_TABLET | Freq: Two times a day (BID) | ORAL | Status: DC
  Administered 2018-04-26 – 2018-04-28 (×4): 1 g via ORAL
  Filled 2018-04-26 (×5): qty 1

## 2018-04-26 NOTE — Evaluation (Signed)
Physical Therapy Evaluation Patient Details Name: [name] [name] MRN: [id number] DOB: [birth year] Today's Date: 04/26/2018   History of Present Illness  77 yo female admitted from home with hyponatremia, . Recent hx of R pubic ramus fx, sacral fx, R L5 transverse process fx. Hx of COPD, cigarette smoker, liver mass, hypothyroidism, chronic back pain.   Clinical Impression   Pt presents with generalized LE weakness, decreased standing balance, increased time/effort to perform mobility tasks, and decreased tolerance for ambulation. Pt to benefit from acute PT to address deficits. Pt ambulated 230 ft with RW with min guard to supervision assist, requiring verbal cuing for safety. Pt expressed concerns to PT about going home given pt's husband has a hernia and cannot assist her. Pt min guard to supervision for all mobility today, feel as if pt is appropriate for HHPT with supervision from husband. PT to progress mobility as tolerated, and will continue to follow acutely.      Follow Up Recommendations Supervision/Assistance - 24 hour;Home health PT    Equipment Recommendations  None recommended by PT    Recommendations for Other Services       Precautions / Restrictions Precautions Precautions: Fall Precaution Comments: pubic ramus fracture in October  Restrictions Weight Bearing Restrictions: No      Mobility  Bed Mobility Overal bed mobility: Needs Assistance Bed Mobility: Supine to Sit     Supine to sit: Supervision Sit to supine: Supervision;HOB elevated   General bed mobility comments: supervision for safety, increased time   Transfers Overall transfer level: Needs assistance Equipment used: Rolling walker (2 wheeled) Transfers: Sit to/from Stand Sit to Stand: Min guard         General transfer comment: min guard for safety. VC for hand placement on RW.   Ambulation/Gait Ambulation/Gait assistance: Supervision;Min guard Gait Distance (Feet): 230 Feet Assistive  device: Rolling walker (2 wheeled) Gait Pattern/deviations: Step-through pattern;Decreased stride length;Trunk flexed Gait velocity: decr    General Gait Details: min guard to supervision for safety. Verbal cuing for placement in RW x2. Pt with fatigue during ambulation   Stairs            Wheelchair Mobility    Modified Rankin (Stroke Patients Only)       Balance Overall balance assessment: Needs assistance;History of Falls Sitting-balance support: No upper extremity supported Sitting balance-Leahy Scale: Good     Standing balance support: During functional activity;No upper extremity supported Standing balance-Leahy Scale: Fair Standing balance comment: relies on RW and environment for balance. Able to stand without UE support, but unsteady with any challenge to balance.                              Pertinent Vitals/Pain Pain Assessment: Faces Faces Pain Scale: Hurts little more Pain Location: back especially along R paraspinals, with mobility and with sitting upright  Pain Descriptors / Indicators: Discomfort;Sore Pain Intervention(s): Limited activity within patient's tolerance;Monitored during session;Repositioned    Home Living Family/patient expects to be discharged to:: Private residence Living Arrangements: Spouse/significant other Available Help at Discharge: Family;Available PRN/intermittently(Pt's husband "just got a big hernia from helping me up, and he can't do anything for me and I can't do anything for him") Type of Home: House Home Access: Stairs to enter Entrance Stairs-Rails: Right Entrance Stairs-Number of Steps: 2 Home Layout: One level(has a washer and dryer downstairs) Home Equipment: Walker - 2 wheels;Cane - single point;Bedside commode  Prior Function Level of Independence: Needs assistance   Gait / Transfers Assistance Needed: Pt being followed by HHPT, uses RW for mobility   ADL's / Homemaking Assistance Needed: Pt has  cleaning assist, pt's son comes over daily to assist with PICC line         Hand Dominance   Dominant Hand: Right    Extremity/Trunk Assessment   Upper Extremity Assessment Upper Extremity Assessment: Overall WFL for tasks assessed    Lower Extremity Assessment Lower Extremity Assessment: Generalized weakness    Cervical / Trunk Assessment Cervical / Trunk Assessment: Kyphotic  Communication   Communication: No difficulties  Cognition Arousal/Alertness: Awake/alert Behavior During Therapy: WFL for tasks assessed/performed Overall Cognitive Status: Within Functional Limits for tasks assessed                                        General Comments      Exercises     Assessment/Plan    PT Assessment Patient needs continued PT services  PT Problem List Decreased strength;Decreased balance;Decreased activity tolerance;Decreased mobility;Pain;Decreased knowledge of use of DME;Decreased safety awareness       PT Treatment Interventions DME instruction;Gait training;Stair training;Functional mobility training;Therapeutic activities;Therapeutic exercise;Balance training;Patient/family education    PT Goals (Current goals can be found in the Care Plan section)  Acute Rehab PT Goals Patient Stated Goal: none stated  PT Goal Formulation: With patient Time For Goal Achievement: 05/10/18 Potential to Achieve Goals: Good    Frequency Min 3X/week   Barriers to discharge        Co-evaluation               AM-PAC PT "6 Clicks" Daily Activity  Outcome Measure Difficulty turning over in bed (including adjusting bedclothes, sheets and blankets)?: A Little Difficulty moving from lying on back to sitting on the side of the bed? : A Little Difficulty sitting down on and standing up from a chair with arms (e.g., wheelchair, bedside commode, etc,.)?: Unable Help needed moving to and from a bed to chair (including a wheelchair)?: A Little Help needed  walking in hospital room?: A Little Help needed climbing 3-5 steps with a railing? : A Little 6 Click Score: 16    End of Session Equipment Utilized During Treatment: Gait belt Activity Tolerance: Patient tolerated treatment well;Patient limited by fatigue Patient left: with call bell/phone within reach;in chair;with chair alarm set Nurse Communication: Mobility status PT Visit Diagnosis: Muscle weakness (generalized) (M62.81);Other abnormalities of gait and mobility (R26.89)    Time: 3662-9476 PT Time Calculation (min) (ACUTE ONLY): 29 min   Charges:   PT Evaluation $PT Eval Low Complexity: 1 Low PT Treatments $Gait Training: 8-22 mins       Julien Girt, PT Acute Rehabilitation Services Pager [phone]  Office [phone]   Tareq Dwan D Felesha Moncrieffe 04/26/2018, 2:07 PM

## 2018-04-26 NOTE — Progress Notes (Signed)
[facility]  Patient Status: Active (receiving services up to time of hospitalization)  [facility] is providing the following services: RN, PT and OT  If patient discharges after hours, please call [phone].   [name] [name] 04/26/2018, 1:08 PM

## 2018-04-26 NOTE — Progress Notes (Signed)
PROGRESS NOTE    [name] [name]  WER:[id number] DOB: 1940/11/06 DOA: 04/25/2018 PCP: Marin Olp, MD      Brief Narrative:  [name] [name] is a 77 y.o. F with COPD well controlled, hypothyroidism, and recent pubic ramus fractures on new opiates who presents with recurrent hyponatremia.     Assessment & Plan:  Hyponatremia Baseline Na 133 a year ago.    More recently, she was found incidentally to have hypoNa 118 in early October (labs drawn due to hypothyroidism, constipation, just "not right").  Admitted, Uosm very low, appeared to be mainly from nonadherence to thyroid meds, poor solute intake and polydipsia.  At that time had weakness and confusion/inattentiveness.  No med triggers other than opiates, normal CXR.  TSH slightly up.  Cortisol normal.  Fluid restricted, levothyroxine restarted and Na up to 133 at d/c.  Readmitted mid October for same reason (routine lab draw, sodium trending back down to 123, essentially asymptomatic).  In hospital, Uosm now concentrated.  Nephrology consulted, thought mixed picture from low solute, hypervolemia.  Treated with few doses Lasix, but also again fluid restriction.  Na slowly resolved to 131, discharged to home.    Now finally, a third time, routine lab draw shows Na back to 120.  Patient in room arguing with nursing about fluid restrictions.  Uosm now clearly up inappropriately, Una up as well.   Here she is maybe mildly symptomatic (fatigue/weakness).  No seizures. No headache, confusion.   No edema at all. -Fluid restriction 800 cc daily -Trend BMP   Pubic ramus fracture -Schedule acetaminophen -Avoid NSAIDs  -Given hypoNa temporally followed starting daily opiates for her groin pain, I will transition to a lower dose of a different agent, try to minimize use  Hypothyroidism TSH has been slightly up, due to nonadherence at home -Continue levothyroxine  Recent asymptomatic bacteriuria Had ESBL bacteriuria during a previous  admission.  Got a PICC and was treated with meropenem.  Recently started on nitrofurantoin for recurrent bacteriuria. Currently without any symptoms of UTI at all.  I agree with ID referral.    -No antibiotics unless fever  Other medications -Hold pantoprazole unless needed       MDM and disposition: The below labs and imaging reports were reviewed and summarized above.  Medication management as above.  The patient was admitted with symptomatic hyponatremia.  Na 120 on admission.  Likely 2-3 days admission.  Close monitoring electrolytes.          DVT prophylaxis: Lovenox Code Status: FULL Family Communication: Son at bedside    Consultants:   None  Procedures:   None  Antimicrobials:   None    Subjective: Weak, a lot of back pain.  No significant groin pain.  No dysuria, hematuria, foul urine, suprapubic pain, urinary urgency or frequency.  She is somewhat irritated by not being able to drink.  No fever.  Objective: Vitals:   04/25/18 2130 04/25/18 2207 04/26/18 0536 04/26/18 1431  BP: (!) 142/85 139/86 103/70 103/67  Pulse: 84 87 79 87  Resp: 16 18 18 18   Temp:  97.9 F (36.6 C) 97.9 F (36.6 C) 98 F (36.7 C)  TempSrc:  Oral Oral Oral  SpO2: 95% 95% 96% 95%  Weight:      Height:        Intake/Output Summary (Last 24 hours) at 04/26/2018 1433 Last data filed at 04/26/2018 0939 Gross per 24 hour  Intake 600 ml  Output -  Net 600 ml  Filed Weights   04/25/18 1637  Weight: 67.2 kg    Examination: General appearance: Thin elderly adult female, alert and in no acute distress.   HEENT: Anicteric, conjunctiva pink, lids and lashes normal. No nasal deformity, discharge, epistaxis.  Lips moist, dentition normal, OP tacky dry, no oral lesions, hearing diminished.   Skin: Warm and dry.  No jaundice.  No suspicious rashes or lesions. Cardiac: RRR, nl S1-S2, no murmurs appreciated.  Capillary refill is brisk.  JVP normal.  No LE edema.  Radia  pulses 2+  and symmetric. Respiratory: Normal respiratory rate and rhythm.  CTAB without rales or wheezes. Abdomen: Abdomen soft.  No TTP. No ascites, distension, hepatosplenomegaly.   MSK: No deformities or effusions of large jonits.  No CVA tenderness. No vertebral tenderness. Neuro: Awake and alert.  EOMI, moves all extremities with 5-/5 strength, equal. Speech fluent.    Psych: Sensorium intact and responding to questions, attention normal. Affect normal.  Judgment and insight appear slightly impaired.    Data Reviewed: I have personally reviewed following labs and imaging studies:  CBC: Recent Labs  Lab 04/25/18 1402 04/25/18 1810  WBC 6.8 7.0  NEUTROABS  --  4.7  HGB 10.4* 10.2*  HCT 30.1* 30.8*  MCV 83.7 86.3  PLT 433.0* 286*   Basic Metabolic Panel: Recent Labs  Lab 04/25/18 1402 04/25/18 1810 04/25/18 2205 04/26/18 0438  NA 120* 123* 125* 125*  K 4.1 4.0 3.9 4.0  CL 88* 90* 92* 93*  CO2 26 23 24 25   GLUCOSE 102* 97 94 92  BUN 15 13 12 10   CREATININE 0.71 0.60 0.62 0.59  CALCIUM 9.1 9.0 8.7* 8.7*   GFR: Estimated Creatinine Clearance: 55.5 mL/min (by C-G formula based on SCr of 0.59 mg/dL). Liver Function Tests: Recent Labs  Lab 04/25/18 1402  AST 40*  ALT 15  ALKPHOS 173*  BILITOT 0.4  PROT 6.8  ALBUMIN 3.8   No results for input(s): LIPASE, AMYLASE in the last 168 hours. No results for input(s): AMMONIA in the last 168 hours. Coagulation Profile: No results for input(s): INR, PROTIME in the last 168 hours. Cardiac Enzymes: No results for input(s): CKTOTAL, CKMB, CKMBINDEX, TROPONINI in the last 168 hours. BNP (last 3 results) No results for input(s): PROBNP in the last 8760 hours. HbA1C: No results for input(s): HGBA1C in the last 72 hours. CBG: No results for input(s): GLUCAP in the last 168 hours. Lipid Profile: No results for input(s): CHOL, HDL, LDLCALC, TRIG, CHOLHDL, LDLDIRECT in the last 72 hours. Thyroid Function Tests: Recent Labs     04/25/18 1402  TSH 5.13*   Anemia Panel: No results for input(s): VITAMINB12, FOLATE, FERRITIN, TIBC, IRON, RETICCTPCT in the last 72 hours. Urine analysis:    Component Value Date/Time   COLORURINE YELLOW 04/25/2018 1922   APPEARANCEUR CLEAR 04/25/2018 1922   LABSPEC 1.010 04/25/2018 1922   PHURINE 7.0 04/25/2018 1922   GLUCOSEU NEGATIVE 04/25/2018 1922   GLUCOSEU NEGATIVE 01/28/2018 1055   HGBUR NEGATIVE 04/25/2018 1922   BILIRUBINUR NEGATIVE 04/25/2018 1922   BILIRUBINUR Negative 04/25/2018 1432   KETONESUR 5 (A) 04/25/2018 1922   PROTEINUR NEGATIVE 04/25/2018 1922   UROBILINOGEN 0.2 04/25/2018 1432   UROBILINOGEN 0.2 01/28/2018 1055   NITRITE NEGATIVE 04/25/2018 1922   LEUKOCYTESUR NEGATIVE 04/25/2018 1922   Sepsis Labs: @LABRCNTIP (procalcitonin:4,lacticacidven:4)  )No results found for this or any previous visit (from the past 240 hour(s)).       Radiology Studies: No results found.  Scheduled Meds: . acetaminophen  1,000 mg Oral TID  . enoxaparin (LOVENOX) injection  40 mg Subcutaneous Q24H  . levothyroxine  75 mcg Oral QAC breakfast  . pantoprazole  40 mg Oral Daily   Continuous Infusions:   LOS: 0 days    Time spent: 35 minutes    Edwin Dada, MD [facility] 04/26/2018, 2:33 PM     Pager [phone] --- please page though AMION:  [email or web address] If 7PM-7AM, please contact night-coverage

## 2018-04-27 ENCOUNTER — Inpatient Hospital Stay (HOSPITAL_COMMUNITY): Payer: Medicare Other

## 2018-04-27 DIAGNOSIS — J810 Acute pulmonary edema: Secondary | ICD-10-CM

## 2018-04-27 DIAGNOSIS — S22000A Wedge compression fracture of unspecified thoracic vertebra, initial encounter for closed fracture: Secondary | ICD-10-CM

## 2018-04-27 DIAGNOSIS — G9341 Metabolic encephalopathy: Secondary | ICD-10-CM

## 2018-04-27 LAB — BASIC METABOLIC PANEL
ANION GAP: 7 (ref 5–15)
BUN: 11 mg/dL (ref 8–23)
CALCIUM: 8.7 mg/dL — AB (ref 8.9–10.3)
CHLORIDE: 92 mmol/L — AB (ref 98–111)
CO2: 25 mmol/L (ref 22–32)
Creatinine, Ser: 0.62 mg/dL (ref 0.44–1.00)
GFR calc Af Amer: >60 mL/min (ref >60)
GFR calc non Af Amer: >60 mL/min (ref >60)
GLUCOSE: 98 mg/dL (ref 70–99)
Potassium: 3.9 mmol/L (ref 3.5–5.1)
Sodium: 124 mmol/L — ABNORMAL LOW (ref 135–145)

## 2018-04-27 LAB — URINE CULTURE

## 2018-04-27 MED ORDER — NAPROXEN 500 MG PO TABS
250.0000 mg | ORAL_TABLET | Freq: Two times a day (BID) | ORAL | Status: DC
  Administered 2018-04-27 – 2018-04-28 (×4): 250 mg via ORAL
  Filled 2018-04-27 (×4): qty 1

## 2018-04-27 MED ORDER — SODIUM CHLORIDE 0.9% FLUSH
10.0000 mL | Freq: Two times a day (BID) | INTRAVENOUS | Status: DC
  Administered 2018-04-27 – 2018-05-10 (×25): 10 mL via INTRAVENOUS

## 2018-04-27 MED ORDER — NAPROXEN SODIUM 275 MG PO TABS: 275.0000 mg | ORAL_TABLET | Freq: Two times a day (BID) | ORAL | Status: DC

## 2018-04-27 NOTE — Progress Notes (Signed)
PROGRESS NOTE    MAKINA SKOW  MVE:[id number] DOB: 1940-07-06 DOA: 04/25/2018 PCP: Marin Olp, MD      Brief Narrative:  Mrs. Masri is a 77 y.o. F with COPD well controlled, hypothyroidism, and recent pubic ramus fractures on new opiates who presents with recurrent hyponatremia.     Assessment & Plan:  Hyponatremia See previous notes.  Na no change today.  Urine studies suggest SIADH, unclear cause. -Fluid restriction 1200 cc daily -Start salt tabs for water clearance -Daily BMP   Thoracic compression fracture THis is a chronic complaints for at least 6 weeks per family.  Severe upper back pain, across the back.  X-ray ordered and shows thoracic compression fracture, age indeterminate, but since late August.   -Continue opiate for pain relief -Consult IR for kyphoplasty  Pubic ramus fracture -Continue scheduled acetaminophen -Given hypoNa temporally followed starting daily opiates for her groin pain, I will transition to a lower dose of a different agent, try to minimize use  Pulmonary edema/effusions This was an incidental finding while evaluating back pain.   -Check BNP, echo  Hypothyroidism TSH has been slightly up, due to nonadherence at home -Continue levothyroxine  Recent asymptomatic bacteriuria Had ESBL bacteriuria during a previous admission.  Got a PICC and was treated with meropenem.  Recently started on nitrofurantoin for recurrent bacteriuria. Currently without any symptoms of UTI at all.  I agree with ID referral.    -No antibiotics unless fever  Other medications -Hold pantoprazole unless needed       MDM and disposition: The below labs and imaging reports reviewed and summarized above.  Medication management as above.  The patient has hyponatremia, severe with mental status changes.  She is not improving today.  Her sodium is stable from yesterday.  She had edition she has a new compression fracture of the back for which we are  consulting, new pulmonary edema for which we are working up with laboratory studies and echocardiogram.           DVT prophylaxis: Lovenox Code Status: FULL Family Communication: Son at bedside    Consultants:   None  Procedures:   None  Antimicrobials:   None    Subjective: Weak, hard of hearing.  Still confused from time to time, tries to go out in the hallway to go to the bathroom, not sure which room is hers, intermittently not sure where she has.  Still with severe back pain in the upper back, radiating across the back.  No dysuria, hematuria, foul urine, suprapubic pain, urinary urgency, frequency.   No new fever.  Objective: Vitals:   04/26/18 1431 04/26/18 2124 04/27/18 0521 04/27/18 1414  BP: 103/67 121/80 123/83 98/69  Pulse: 87 80 89 91  Resp: 18 20 16 16   Temp: 98 F (36.7 C) 97.8 F (36.6 C) 97.7 F (36.5 C) 98 F (36.7 C)  TempSrc: Oral Oral Oral Oral  SpO2: 95% 96% 95% 96%  Weight:      Height:        Intake/Output Summary (Last 24 hours) at 04/27/2018 1524 Last data filed at 04/27/2018 1300 Gross per 24 hour  Intake 840 ml  Output 400 ml  Net 440 ml   Filed Weights   04/25/18 1637  Weight: 67.2 kg    Examination: General appearance: Thin elderly female, no acute distress. HEENT: Anicteric, conjunctival pink, lids and lashes normal.  No nasal deformity, discharge, or epistaxis.  Lips moist, dentition normal, oropharynx tacky dry, no  oral lesions, hearing diminished.   Skin: Skin warm and dry, no jaundice, no suspicious rashes or lesions. Cardiac: Regular rate and rhythm, no murmurs, JVP normal, no lower extremity edema at all. Respiratory: Normal rate and rhythm, no rales, occasional wheezing. Abdomen: Abdomen soft, no tenderness palpation, ascites, distention, or hepatosplenomegaly MSK: No deformities or effusions of large joints, no CVA tenderness.  Today when I palpate her thoracic spine, I do notice there is some point  tenderness. Neuro: Awake and alert, extraocular movements intact, moves all extremities with 5-/5 strength, symmetric coordination.  Speech fluent.    Psych: Sensorium intact and responding to questions, attention diminsihed, affect normal judgment and insight impaired.    Data Reviewed: I have personally reviewed following labs and imaging studies:  CBC: Recent Labs  Lab 04/25/18 1402 04/25/18 1810  WBC 6.8 7.0  NEUTROABS  --  4.7  HGB 10.4* 10.2*  HCT 30.1* 30.8*  MCV 83.7 86.3  PLT 433.0* 579*   Basic Metabolic Panel: Recent Labs  Lab 04/25/18 1402 04/25/18 1810 04/25/18 2205 04/26/18 0438 04/27/18 0559  NA 120* 123* 125* 125* 124*  K 4.1 4.0 3.9 4.0 3.9  CL 88* 90* 92* 93* 92*  CO2 26 23 24 25 25   GLUCOSE 102* 97 94 92 98  BUN 15 13 12 10 11   CREATININE 0.71 0.60 0.62 0.59 0.62  CALCIUM 9.1 9.0 8.7* 8.7* 8.7*   GFR: Estimated Creatinine Clearance: 55.5 mL/min (by C-G formula based on SCr of 0.62 mg/dL). Liver Function Tests: Recent Labs  Lab 04/25/18 1402  AST 40*  ALT 15  ALKPHOS 173*  BILITOT 0.4  PROT 6.8  ALBUMIN 3.8   No results for input(s): LIPASE, AMYLASE in the last 168 hours. No results for input(s): AMMONIA in the last 168 hours. Coagulation Profile: No results for input(s): INR, PROTIME in the last 168 hours. Cardiac Enzymes: No results for input(s): CKTOTAL, CKMB, CKMBINDEX, TROPONINI in the last 168 hours. BNP (last 3 results) No results for input(s): PROBNP in the last 8760 hours. HbA1C: No results for input(s): HGBA1C in the last 72 hours. CBG: No results for input(s): GLUCAP in the last 168 hours. Lipid Profile: No results for input(s): CHOL, HDL, LDLCALC, TRIG, CHOLHDL, LDLDIRECT in the last 72 hours. Thyroid Function Tests: Recent Labs    04/25/18 1402  TSH 5.13*   Anemia Panel: No results for input(s): VITAMINB12, FOLATE, FERRITIN, TIBC, IRON, RETICCTPCT in the last 72 hours. Urine analysis:    Component Value Date/Time    COLORURINE YELLOW 04/25/2018 1922   APPEARANCEUR CLEAR 04/25/2018 1922   LABSPEC 1.010 04/25/2018 1922   PHURINE 7.0 04/25/2018 1922   GLUCOSEU NEGATIVE 04/25/2018 1922   GLUCOSEU NEGATIVE 01/28/2018 1055   HGBUR NEGATIVE 04/25/2018 1922   BILIRUBINUR NEGATIVE 04/25/2018 1922   BILIRUBINUR Negative 04/25/2018 1432   KETONESUR 5 (A) 04/25/2018 1922   PROTEINUR NEGATIVE 04/25/2018 1922   UROBILINOGEN 0.2 04/25/2018 1432   UROBILINOGEN 0.2 01/28/2018 1055   NITRITE NEGATIVE 04/25/2018 1922   LEUKOCYTESUR NEGATIVE 04/25/2018 1922   Sepsis Labs: @LABRCNTIP (procalcitonin:4,lacticacidven:4)  ) Recent Results (from the past 240 hour(s))  Urine Culture     Status: None   Collection Time: 04/25/18  2:02 PM  Result Value Ref Range Status   MICRO NUMBER: [id number]  Final   SPECIMEN QUALITY: ADEQUATE  Final   Sample Source NOT GIVEN  Final   STATUS: FINAL  Final   Result: No Growth  Final  Culture, Urine  Status: Abnormal   Collection Time: 04/25/18  9:35 PM  Result Value Ref Range Status   Specimen Description   Final    URINE, CLEAN CATCH Performed at [facility], [place] [address] [place], [place] [address]    Special Requests   Final    NONE Performed at [facility], [place] [address], [place], [place] [address]    Culture (A)  Final    <10,000 COLONIES/mL INSIGNIFICANT GROWTH Performed at [facility] [address], [place], [place] [address]    Report Status 04/27/2018 FINAL  Final         Radiology Studies: Dg Chest 2 View  Result Date: 04/27/2018 CLINICAL DATA:  Low back pain 3 months.  No trauma. EXAM: CHEST - 2 VIEW COMPARISON:  03/23/2018 and thoracic spine 02/01/2018 FINDINGS: Right-sided PICC line has tip over the SVC. Lungs are adequately inflated with hazy prominence of the perihilar markings suggesting interstitial edema. Small amount of posterior pleural fluid on the lateral film. Cardiomediastinal silhouette is  within normal. Mild degenerate change of the spine. Mild T9 compression fracture new since 02/01/2018 but unchanged from the recent chest radiograph. IMPRESSION: Evidence of mild interstitial edema with small bilateral pleural effusions. Mild T9 compression fracture age indeterminate but new since 02/01/2018. Right-sided PICC line with tip over the SVC. Electronically Signed   By: Marin Olp M.D.   On: 04/27/2018 12:06   Dg Thoracic Spine 2 View  Result Date: 04/27/2018 CLINICAL DATA:  Back pain 3 months.  No trauma. EXAM: THORACIC SPINE 2 VIEWS COMPARISON:  Chest x-ray 03/23/2018 and thoracic spine 02/01/2018 FINDINGS: Right-sided PICC line with tip over the SVC. Vertebral body alignment is normal. Pedicles are intact. There is mild spondylosis throughout the thoracic spine. There is mild loss of height of T9 new since 02/01/2018. Remainder of the exam is unchanged. IMPRESSION: Mild T9 compression fracture age indeterminate but new since 02/01/2018. Mild spondylosis of the thoracic spine. Electronically Signed   By: Marin Olp M.D.   On: 04/27/2018 12:03   Dg Lumbar Spine 2-3 Views  Result Date: 04/27/2018 CLINICAL DATA:  Low back pain 3 months.  No trauma. EXAM: LUMBAR SPINE - 2-3 VIEW COMPARISON:  CT 04/01/2018 FINDINGS: Subtle curvature convex left. Vertebral body heights are maintained. There is moderate spondylosis throughout the lumbar spine to include facet arthropathy. Significant multilevel disc disease at all levels of the lumbar spine. No acute compression fracture or subluxation. Calcified plaque over the abdominal aorta. Evidence of patient's known right superior pubic ramus/acetabular fracture. IMPRESSION: Moderate spondylosis of the lumbar spine with moderate multilevel disc disease. Known right acetabular/superior pubic ramus fracture. Electronically Signed   By: Marin Olp M.D.   On: 04/27/2018 11:59        Scheduled Meds: . acetaminophen  1,000 mg Oral TID  . enoxaparin  (LOVENOX) injection  40 mg Subcutaneous Q24H  . levothyroxine  75 mcg Oral QAC breakfast  . naproxen  250 mg Oral BID WC  . sodium chloride flush  10 mL Intravenous Q12H  . sodium chloride  1 g Oral BID WC   Continuous Infusions:   LOS: 1 day    Time spent: 35 minutes   Edwin Dada, MD [facility] 04/27/2018, 3:24 PM     Pager [phone] --- please page though AMION:  [email or web address] Password [email or web address] If 7PM-7AM, please contact night-coverage

## 2018-04-28 ENCOUNTER — Inpatient Hospital Stay (HOSPITAL_COMMUNITY): Payer: Medicare Other

## 2018-04-28 ENCOUNTER — Encounter (HOSPITAL_COMMUNITY): Payer: Self-pay

## 2018-04-28 DIAGNOSIS — I5030 Unspecified diastolic (congestive) heart failure: Secondary | ICD-10-CM

## 2018-04-28 DIAGNOSIS — R918 Other nonspecific abnormal finding of lung field: Secondary | ICD-10-CM

## 2018-04-28 DIAGNOSIS — I5031 Acute diastolic (congestive) heart failure: Secondary | ICD-10-CM

## 2018-04-28 LAB — CBC
HCT: 31.1 % — ABNORMAL LOW (ref 36.0–46.0)
Hemoglobin: 10 g/dL — ABNORMAL LOW (ref 12.0–15.0)
MCH: 27.5 pg (ref 26.0–34.0)
MCHC: 32.2 g/dL (ref 30.0–36.0)
MCV: 85.7 fL (ref 80.0–100.0)
PLATELETS: 452 10*3/uL — AB (ref 150–400)
RBC: 3.63 MIL/uL — AB (ref 3.87–5.11)
RDW: 13.7 % (ref 11.5–15.5)
WBC: 6.4 10*3/uL (ref 4.0–10.5)
nRBC: 0 % (ref 0.0–0.2)

## 2018-04-28 LAB — BASIC METABOLIC PANEL
Anion gap: 8 (ref 5–15)
BUN: 12 mg/dL (ref 8–23)
CO2: 26 mmol/L (ref 22–32)
Calcium: 8.8 mg/dL — ABNORMAL LOW (ref 8.9–10.3)
Chloride: 92 mmol/L — ABNORMAL LOW (ref 98–111)
Creatinine, Ser: 0.64 mg/dL (ref 0.44–1.00)
GFR calc Af Amer: >60 mL/min (ref >60)
GFR calc non Af Amer: >60 mL/min (ref >60)
Glucose, Bld: 106 mg/dL — ABNORMAL HIGH (ref 70–99)
POTASSIUM: 3.8 mmol/L (ref 3.5–5.1)
SODIUM: 126 mmol/L — AB (ref 135–145)

## 2018-04-28 LAB — ECHOCARDIOGRAM COMPLETE
Height: 67 in
Weight: 2372.01 oz

## 2018-04-28 LAB — BRAIN NATRIURETIC PEPTIDE: B Natriuretic Peptide: 640.3 pg/mL — ABNORMAL HIGH (ref 0.0–100.0)

## 2018-04-28 MED ORDER — HYDROCODONE-ACETAMINOPHEN 5-325 MG PO TABS
1.0000 | ORAL_TABLET | ORAL | Status: DC | PRN
  Administered 2018-04-28 – 2018-04-30 (×5): 1 via ORAL
  Filled 2018-04-28 (×5): qty 1

## 2018-04-28 MED ORDER — IOHEXOL 300 MG/ML  SOLN: 30.0000 mL | Freq: Once | INTRAMUSCULAR | Status: AC | PRN

## 2018-04-28 MED ORDER — SODIUM CHLORIDE (PF) 0.9 % IJ SOLN
INTRAMUSCULAR | Status: AC
  Administered 2018-04-28: 22:00:00
  Filled 2018-04-28: qty 50

## 2018-04-28 MED ORDER — DEXAMETHASONE 4 MG PO TABS
4.0000 mg | ORAL_TABLET | Freq: Three times a day (TID) | ORAL | Status: DC
  Administered 2018-04-28 – 2018-05-11 (×40): 4 mg via ORAL
  Filled 2018-04-28 (×40): qty 1

## 2018-04-28 MED ORDER — IOHEXOL 300 MG/ML  SOLN
100.0000 mL | Freq: Once | INTRAMUSCULAR | Status: AC | PRN
  Administered 2018-04-28: 100 mL via INTRAVENOUS

## 2018-04-28 MED ORDER — SENNA 8.6 MG PO TABS
1.0000 | ORAL_TABLET | Freq: Every day | ORAL | Status: DC | PRN
  Administered 2018-04-28: 8.6 mg via ORAL
  Filled 2018-04-28: qty 1

## 2018-04-28 MED ORDER — FUROSEMIDE 10 MG/ML IJ SOLN
40.0000 mg | Freq: Once | INTRAMUSCULAR | Status: AC
  Administered 2018-04-28: 40 mg via INTRAVENOUS
  Filled 2018-04-28: qty 4

## 2018-04-28 MED ORDER — LORAZEPAM 0.5 MG PO TABS
0.5000 mg | ORAL_TABLET | Freq: Once | ORAL | Status: AC
  Administered 2018-04-28: 0.5 mg via ORAL
  Filled 2018-04-28: qty 1

## 2018-04-28 NOTE — Progress Notes (Signed)
PROGRESS NOTE    [name] [name]  TIW:[id number] DOB: Mar 14, 1941 DOA: 04/25/2018 PCP: Marin Olp, MD      Brief Narrative:  [name] [name] is a 77 y.o. F with COPD well controlled, hypothyroidism, and recent pubic ramus fractures on new opiates who presents with recurrent hyponatremia.     Assessment & Plan:  Metastatic cancer, suspect lung primary Thoracic compression fracture, pathologic MRI obtained today to further evaluate T9 compression fracture shows widespread bony metastasis, possible hepatic metastasis, hilar mass.   -Obtain CT C/A/P with contrast -Consult Neurosurgery given impending T6 epidural tumor cord compression -Consult IR for biopsy spine lesion    Hyponatremia From lung cancer.   -Continue fluid restriction -Stop salt tabs due to CHF -Daily BMP  Acute diastolic CHF Edema on CXR, wheezing on exam.  BNP >600.  Echo today shows E 50%, grade I DD.  No significant valve disease, no prior history of CHF. -Furosemide 40 mg IV  -Daily BMP -Strict I/Os, daily weights  Hypothyroidism -Continue levothyroxine  Recent asymptomatic bacteriuria Had ESBL bacteriuria during a previous admission.  Got a PICC and was treated with meropenem.  Recently started on nitrofurantoin for recurrent bacteriuria. Currently without any symptoms of UTI at all.  I agree with ID referral.    -No antibiotics unless fever         MDM and disposition: The below labs and imaging reports were reviewed and summarized above.  Medication management as above.Patient is discussed with radiology, neurosurgery.   He was initially admitted with hyponatremia, severe with mental status changes.  She also has new congestive heart failure, as well as a new diagnosis of metastatic cancer.  Above work-up.           DVT prophylaxis: SCDs Code Status: FULL Family Communication: Both sons and husband at bedside.    Consultants:   Neurosurgery  Procedures:    None  Antimicrobials:   None    Subjective: Less confused today, but still with severe pain radiating across back.  No urinary symptoms.  No new fever.     Objective: Vitals:   04/27/18 1414 04/27/18 2139 04/28/18 0428 04/28/18 1227  BP: 98/69 134/87 (!) 150/96 126/88  Pulse: 91 84 93 86  Resp: 16 18 16 16   Temp: 98 F (36.7 C) 97.7 F (36.5 C) 97.6 F (36.4 C) 97.9 F (36.6 C)  TempSrc: Oral Oral Oral Oral  SpO2: 96% 95% 95% 91%  Weight:      Height:        Intake/Output Summary (Last 24 hours) at 04/28/2018 1601 Last data filed at 04/28/2018 0930 Gross per 24 hour  Intake 620 ml  Output -  Net 620 ml   Filed Weights   04/25/18 1637  Weight: 67.2 kg    Examination: General appearance: Thin elderly female, no acute distress. HEENT: Anicteric, conjunctival pink, lids and lashes normal.  No nasal deformity, discharge, or epistaxis.  Lips moist, dentition normal, oropharynx normal, no oral lesions.  Hearing diminished. Skin: Skin warm and dry, no jaundice or suspicious rashes or lesions. Cardiac: Regular rate and rhythm, no murmurs, no lower extremity edema Respiratory: Normal rate and rhythm, no rales, wheezing bilaterally. Abdomen: Abdomen soft, no tenderness to palpation, ascites, or distention. MSK: No deformities or effusions of the large joints, no CVA tenderness.  Tenderness palpation along thoracic spine.   Neuro: Awake and alert, extraocular movements intact, moves all extremities with 5 strength, symmetric coordination.  Speech fluent.    Psych:  Sensorium intact responding to questions, attention diminished, affect normal, judgment insight appear somewhat impaired.    Data Reviewed: I have personally reviewed following labs and imaging studies:  CBC: Recent Labs  Lab 04/25/18 1402 04/25/18 1810 04/28/18 0549  WBC 6.8 7.0 6.4  NEUTROABS  --  4.7  --   HGB 10.4* 10.2* 10.0*  HCT 30.1* 30.8* 31.1*  MCV 83.7 86.3 85.7  PLT 433.0* 412* 452*    Basic Metabolic Panel: Recent Labs  Lab 04/25/18 1810 04/25/18 2205 04/26/18 0438 04/27/18 0559 04/28/18 0549  NA 123* 125* 125* 124* 126*  K 4.0 3.9 4.0 3.9 3.8  CL 90* 92* 93* 92* 92*  CO2 23 24 25 25 26   GLUCOSE 97 94 92 98 106*  BUN 13 12 10 11 12   CREATININE 0.60 0.62 0.59 0.62 0.64  CALCIUM 9.0 8.7* 8.7* 8.7* 8.8*   GFR: Estimated Creatinine Clearance: 55.5 mL/min (by C-G formula based on SCr of 0.64 mg/dL). Liver Function Tests: Recent Labs  Lab 04/25/18 1402  AST 40*  ALT 15  ALKPHOS 173*  BILITOT 0.4  PROT 6.8  ALBUMIN 3.8   No results for input(s): LIPASE, AMYLASE in the last 168 hours. No results for input(s): AMMONIA in the last 168 hours. Coagulation Profile: No results for input(s): INR, PROTIME in the last 168 hours. Cardiac Enzymes: No results for input(s): CKTOTAL, CKMB, CKMBINDEX, TROPONINI in the last 168 hours. BNP (last 3 results) No results for input(s): PROBNP in the last 8760 hours. HbA1C: No results for input(s): HGBA1C in the last 72 hours. CBG: No results for input(s): GLUCAP in the last 168 hours. Lipid Profile: No results for input(s): CHOL, HDL, LDLCALC, TRIG, CHOLHDL, LDLDIRECT in the last 72 hours. Thyroid Function Tests: No results for input(s): TSH, T4TOTAL, FREET4, T3FREE, THYROIDAB in the last 72 hours. Anemia Panel: No results for input(s): VITAMINB12, FOLATE, FERRITIN, TIBC, IRON, RETICCTPCT in the last 72 hours. Urine analysis:    Component Value Date/Time   COLORURINE YELLOW 04/25/2018 1922   APPEARANCEUR CLEAR 04/25/2018 1922   LABSPEC 1.010 04/25/2018 1922   PHURINE 7.0 04/25/2018 1922   GLUCOSEU NEGATIVE 04/25/2018 1922   GLUCOSEU NEGATIVE 01/28/2018 1055   HGBUR NEGATIVE 04/25/2018 1922   BILIRUBINUR NEGATIVE 04/25/2018 1922   BILIRUBINUR Negative 04/25/2018 1432   KETONESUR 5 (A) 04/25/2018 1922   PROTEINUR NEGATIVE 04/25/2018 1922   UROBILINOGEN 0.2 04/25/2018 1432   UROBILINOGEN 0.2 01/28/2018 1055    NITRITE NEGATIVE 04/25/2018 1922   LEUKOCYTESUR NEGATIVE 04/25/2018 1922   Sepsis Labs: @LABRCNTIP (procalcitonin:4,lacticacidven:4)  ) Recent Results (from the past 240 hour(s))  Urine Culture     Status: None   Collection Time: 04/25/18  2:02 PM  Result Value Ref Range Status   MICRO NUMBER: [id number]  Final   SPECIMEN QUALITY: ADEQUATE  Final   Sample Source NOT GIVEN  Final   STATUS: FINAL  Final   Result: No Growth  Final  Culture, Urine     Status: Abnormal   Collection Time: 04/25/18  9:35 PM  Result Value Ref Range Status   Specimen Description   Final    URINE, CLEAN CATCH Performed at [facility], [place] [address], [place], [place] [address]    Special Requests   Final    NONE Performed at [facility], [place] [address] [place], [place] [address]    Culture (A)  Final    <10,000 COLONIES/mL INSIGNIFICANT GROWTH Performed at [facility]  [address], [place], [place] [address]    Report Status 04/27/2018 FINAL  Final         Radiology Studies: Dg Chest 2 View  Result Date: 04/27/2018 CLINICAL DATA:  Low back pain 3 months.  No trauma. EXAM: CHEST - 2 VIEW COMPARISON:  03/23/2018 and thoracic spine 02/01/2018 FINDINGS: Right-sided PICC line has tip over the SVC. Lungs are adequately inflated with hazy prominence of the perihilar markings suggesting interstitial edema. Small amount of posterior pleural fluid on the lateral film. Cardiomediastinal silhouette is within normal. Mild degenerate change of the spine. Mild T9 compression fracture new since 02/01/2018 but unchanged from the recent chest radiograph. IMPRESSION: Evidence of mild interstitial edema with small bilateral pleural effusions. Mild T9 compression fracture age indeterminate but new since 02/01/2018. Right-sided PICC line with tip over the SVC. Electronically Signed   By: Marin Olp M.D.   On: 04/27/2018 12:06   Dg Thoracic Spine 2 View  Result Date:  04/27/2018 CLINICAL DATA:  Back pain 3 months.  No trauma. EXAM: THORACIC SPINE 2 VIEWS COMPARISON:  Chest x-ray 03/23/2018 and thoracic spine 02/01/2018 FINDINGS: Right-sided PICC line with tip over the SVC. Vertebral body alignment is normal. Pedicles are intact. There is mild spondylosis throughout the thoracic spine. There is mild loss of height of T9 new since 02/01/2018. Remainder of the exam is unchanged. IMPRESSION: Mild T9 compression fracture age indeterminate but new since 02/01/2018. Mild spondylosis of the thoracic spine. Electronically Signed   By: Marin Olp M.D.   On: 04/27/2018 12:03   Dg Lumbar Spine 2-3 Views  Result Date: 04/27/2018 CLINICAL DATA:  Low back pain 3 months.  No trauma. EXAM: LUMBAR SPINE - 2-3 VIEW COMPARISON:  CT 04/01/2018 FINDINGS: Subtle curvature convex left. Vertebral body heights are maintained. There is moderate spondylosis throughout the lumbar spine to include facet arthropathy. Significant multilevel disc disease at all levels of the lumbar spine. No acute compression fracture or subluxation. Calcified plaque over the abdominal aorta. Evidence of patient's known right superior pubic ramus/acetabular fracture. IMPRESSION: Moderate spondylosis of the lumbar spine with moderate multilevel disc disease. Known right acetabular/superior pubic ramus fracture. Electronically Signed   By: Marin Olp M.D.   On: 04/27/2018 11:59   Mr Thoracic Spine Wo Contrast  Result Date: 04/28/2018 CLINICAL DATA:  Back pain. EXAM: MRI THORACIC SPINE WITHOUT CONTRAST TECHNIQUE: Multiplanar, multisequence MR imaging of the thoracic spine was performed. No intravenous contrast was administered. COMPARISON:  Plain films 04/27/2018. FINDINGS: Alignment:  Anatomic Vertebrae: Widespread metastatic disease to the thoracic spine, near complete replacement T1, T6, T7, T9 and T12. Pathologic fracture of T9, loss of approximately 25-33% vertebral body height. Involvement of the pedicles and  posterior elements at T6, extending to the spinous process. Partial vertebral body involvement with metastatic disease of T11, T2, and T5. Cord: Significant cord compression at T6, due to bulky dorsal epidural tumor, roughly 16 x 12 x 32 mm cross-section. No definite abnormal cord signal, but the patient is at risk for symptomatic cord compression. Slight posterior displacement of the posterior wall of T9 with epidural tumor ventrally at this level, which does not result in significant stenosis or impingement. Paraspinal and other soft tissues: BILATERAL pleural effusions, greater on the LEFT. Suspected LEFT hilar mass. Disc levels: Minor disc pathology at multiple levels, not contributory. IMPRESSION: Widespread metastatic disease involving numerous thoracic vertebrae including T1, T2, T5, T6, T7, T9, T11, and T12. Pathologic fracture T9. Bulky dorsal epidural tumor at T6 results in  significant cord compression. Neurosurgical consultation may be warranted. BILATERAL pleural effusions, greater on the LEFT with suspected LEFT hilar mass. Recommend CT chest with contrast for further evaluation. Furthermore, given the heterogeneous appearance of the liver on noncontrast CT from 04/01/2018, consider CT abdomen and pelvis with contrast for additional staging. A call has been placed to the ordering provider. Electronically Signed   By: Staci Righter M.D.   On: 04/28/2018 15:31        Scheduled Meds: . acetaminophen  1,000 mg Oral TID  . enoxaparin (LOVENOX) injection  40 mg Subcutaneous Q24H  . furosemide  40 mg Intravenous Once  . levothyroxine  75 mcg Oral QAC breakfast  . naproxen  250 mg Oral BID WC  . sodium chloride flush  10 mL Intravenous Q12H  . sodium chloride  1 g Oral BID WC   Continuous Infusions:   LOS: 2 days    Time spent: 35 minutes   Edwin Dada, MD [facility] 04/28/2018, 4:01 PM     Pager [phone] --- please page though AMION:  [email or web address] Password  [email or web address] If 7PM-7AM, please contact night-coverage

## 2018-04-28 NOTE — Progress Notes (Signed)
  Echocardiogram 2D Echocardiogram has been performed.  Merrie Roof F 04/28/2018, 9:50 AM

## 2018-04-28 NOTE — Progress Notes (Signed)
Request for kyphoplasty due to thoracic compression fracture of indeterminate age (at least 6 weeks per family) with chronic severe pain. Patient was discussed with Dr. Laurence Ferrari who recommends MRI without contrast. If there is known cancer history or a lesion that is concerning for pathologic fracture then the MRI should be with contrast.   If primary team feels MRI is appropriate please order and IR will follow up for further evaluation of candidacy for kyphoplasty once results are obtained.   Please call IR with questions or concerns.  Candiss Norse, PA-C Interventional Radiology

## 2018-04-29 ENCOUNTER — Ambulatory Visit
Admit: 2018-04-29 | Discharge: 2018-04-29 | Disposition: A | Discharge status: Home / Self Care | Payer: Medicare Other | Source: Ambulatory Visit | Attending: Radiation Oncology | Admitting: Radiation Oncology

## 2018-04-29 ENCOUNTER — Inpatient Hospital Stay (HOSPITAL_COMMUNITY): Payer: Medicare Other

## 2018-04-29 DIAGNOSIS — C7949 Secondary malignant neoplasm of other parts of nervous system: Secondary | ICD-10-CM

## 2018-04-29 DIAGNOSIS — G9529 Other cord compression: Secondary | ICD-10-CM | POA: Insufficient documentation

## 2018-04-29 DIAGNOSIS — C7951 Secondary malignant neoplasm of bone: Secondary | ICD-10-CM

## 2018-04-29 DIAGNOSIS — C3492 Malignant neoplasm of unspecified part of left bronchus or lung: Secondary | ICD-10-CM

## 2018-04-29 DIAGNOSIS — C3412 Malignant neoplasm of upper lobe, left bronchus or lung: Secondary | ICD-10-CM

## 2018-04-29 LAB — CBC
HEMATOCRIT: 30.6 % — AB (ref 36.0–46.0)
HEMOGLOBIN: 9.9 g/dL — AB (ref 12.0–15.0)
MCH: 27.9 pg (ref 26.0–34.0)
MCHC: 32.4 g/dL (ref 30.0–36.0)
MCV: 86.2 fL (ref 80.0–100.0)
Platelets: 383 10*3/uL (ref 150–400)
RBC: 3.55 MIL/uL — ABNORMAL LOW (ref 3.87–5.11)
RDW: 13.7 % (ref 11.5–15.5)
WBC: 4.5 10*3/uL (ref 4.0–10.5)
nRBC: 0 % (ref 0.0–0.2)

## 2018-04-29 LAB — BASIC METABOLIC PANEL
ANION GAP: 9 (ref 5–15)
BUN: 14 mg/dL (ref 8–23)
CALCIUM: 9.2 mg/dL (ref 8.9–10.3)
CO2: 27 mmol/L (ref 22–32)
Chloride: 89 mmol/L — ABNORMAL LOW (ref 98–111)
Creatinine, Ser: 0.71 mg/dL (ref 0.44–1.00)
GFR calc Af Amer: >60 mL/min (ref >60)
GFR calc non Af Amer: >60 mL/min (ref >60)
Glucose, Bld: 142 mg/dL — ABNORMAL HIGH (ref 70–99)
Potassium: 4.1 mmol/L (ref 3.5–5.1)
SODIUM: 125 mmol/L — AB (ref 135–145)

## 2018-04-29 LAB — PROTIME-INR
INR: 0.95
PROTHROMBIN TIME: 12.6 s (ref 11.4–15.2)

## 2018-04-29 MED ORDER — LIP MEDEX EX OINT
TOPICAL_OINTMENT | CUTANEOUS | Status: AC
  Filled 2018-04-29: qty 7

## 2018-04-29 MED ORDER — LIDOCAINE HCL 1 % IJ SOLN
INTRAMUSCULAR | Status: AC
  Filled 2018-04-29: qty 20

## 2018-04-29 MED ORDER — LIDOCAINE HCL (PF) 1 % IJ SOLN
INTRAMUSCULAR | Status: AC | PRN
  Administered 2018-04-29: 5 mL

## 2018-04-29 MED ORDER — MIDAZOLAM HCL 2 MG/2ML IJ SOLN
INTRAMUSCULAR | Status: AC | PRN
  Administered 2018-04-29: 1 mg via INTRAVENOUS

## 2018-04-29 MED ORDER — ALPRAZOLAM 0.5 MG PO TABS
0.5000 mg | ORAL_TABLET | Freq: Two times a day (BID) | ORAL | Status: DC | PRN
  Administered 2018-04-29 – 2018-05-09 (×8): 0.5 mg via ORAL
  Filled 2018-04-29 (×8): qty 1

## 2018-04-29 MED ORDER — FUROSEMIDE 10 MG/ML IJ SOLN
40.0000 mg | Freq: Once | INTRAMUSCULAR | Status: AC
  Administered 2018-04-29: 40 mg via INTRAVENOUS
  Filled 2018-04-29: qty 4

## 2018-04-29 MED ORDER — FENTANYL CITRATE (PF) 100 MCG/2ML IJ SOLN
INTRAMUSCULAR | Status: AC
  Filled 2018-04-29: qty 2

## 2018-04-29 MED ORDER — FENTANYL CITRATE (PF) 100 MCG/2ML IJ SOLN
INTRAMUSCULAR | Status: AC | PRN
  Administered 2018-04-29: 50 ug via INTRAVENOUS

## 2018-04-29 MED ORDER — MIDAZOLAM HCL 2 MG/2ML IJ SOLN
INTRAMUSCULAR | Status: AC
  Filled 2018-04-29: qty 2

## 2018-04-29 NOTE — Progress Notes (Addendum)
PT Cancellation Note  Patient Details Name: [name] [name] MRN: [id number] DOB: 09-25-1940   Cancelled Treatment:    Reason Eval/Treat Not Completed: Medical issues which prohibited therapy; pending neurosurgery consult d/t epidural tumor with pending cord compression   [facility] 04/29/2018, 9:54 AM

## 2018-04-29 NOTE — Progress Notes (Signed)
PROGRESS NOTE    [name] [name]  SFK:[id number] DOB: 1940-10-12 DOA: 04/25/2018 PCP: [name] Olp, MD      Brief Narrative:  Mrs. [name] [name] is a 77 y.o. F with COPD well controlled, hypothyroidism, and recent pubic ramus fractures on new opiates who presents with recurrent hyponatremia.     Assessment & Plan:  Metastatic cancer, suspect lung primary Thoracic compression fracture, pathologic MRI obtained yesterday to further evaluate T9 compression fracture shows widespread bony metastasis.  CT last night showed 3 cm left lung mass, suspicious for primary bronchogenic [place], as well as hepatic metastasis, widespread bony metastasis.  Gust with neurosurgery last night, Dr. Trenton [name], they recommend biopsy, urgent radiation oncology consultation.  If this is a small cell cancer, likely radiation therapy adequate alone, otherwise they will be involved. -Consult Rad Oncology, appreciate expert guidance -CT guided liver biopsy planned today or tomorrow     Hyponatremia From lung cancer.  Stable Na today.  Given edema on CXR, suspect this is at least partly hypervolemic. -Continue fluid restriction -Continue Lasix -Daily BMP  Acute metabolic encephalopathy Worsened again today, per family/nursing.  From medications (opiates, benzos) in context of pain and hyponatremia.  Acute diastolic CHF Edema on CXR, had wheezing on exam.   BNP >600.  Echo today shows E 50%, grade I DD.  No significant valve disease, no prior history of CHF.     Negative 1/2L yesterday, wheezing better.   -Repeat IV Lasix today -Daily BMP -Strict I/Os, daily weights  Hypothyroidism -Continue levothyroxine  Recent asymptomatic bacteriuria with ESBL Had ESBL bacteriuria during a previous admission.  Got a PICC and was treated with meropenem.  Recently started on nitrofurantoin for recurrent bacteriuria. Currently without any symptoms of UTI at all.  The patient had an ID referral pending.  This should be  deferred for now.  If she were to develop symptoms of UTI or infection, would be reasonable to consult ID, use carbapenem -No antibiotics unless fever         MDM and disposition: Below labs and imaging reports were reviewed and summarized above.  Medication management as above.  The patient was discussed with radiation oncology, neurosurgery.  I had a long discussion with the family about work-up in the hospital, next steps.  The patietn has a severe and potentially life-threatening illness (metastatic lung cancer) with associated encephalopathy/confusion, her care requires extensive work up and  coordination between multiple specialty consultants  PT recommend home health last week, we will ask them to follow along.    We will likely finish treatment for CHF today.  Her workup for new lung cancer is pending biopsy today.     Discussed with Dr. Julien [name].  Her acute issue is spinal metastasis, and initiating radiation, pending her biopsy.  At the time of discharge, please inbasket message to Dr. Julien [name], and he will arrange close follow up in office.           DVT prophylaxis: SCDs Code Status: FULL Family Communication: Both sons and husband at bedside.    Consultants:   IR  Radiation oncology  Procedures:   MR spine  CT chest/abdomen/pelvis  US guided liver biopsy  Antimicrobials:   None    Subjective: Sleepy today.  More confused today.  Back pain sems well controlled with current regimen.  No urinary symptoms, no dyspnea, no leg swelling no fever.         Objective: Vitals:   04/28/18 1227 04/28/18 2041 04/29/18 0636 04/29/18 1749  BP: 126/88 (!) 142/97 114/65 133/79  Pulse: 86 92 (!) 56 85  Resp: 16 18  18   Temp: 97.9 F (36.6 C) 97.9 F (36.6 C)  97.9 F (36.6 C)  TempSrc: Oral Oral  Oral  SpO2: 91% 94%  92%  Weight:    64.3 kg  Height:        Intake/Output Summary (Last 24 hours) at 04/29/2018 1502 Last data filed at 04/29/2018  1000 Gross per 24 hour  Intake 240 ml  Output 1375 ml  Net -1135 ml   Filed Weights   04/25/18 1637 04/29/18 0643  Weight: 67.2 kg 64.3 kg    Examination: General appearance: Elderly female, sleeping, no acute distress HEENT: Anicteric, conjunctival pink, lids and lashes normal, no nasal deformity, discharge, or epistaxis.  Lips moist, dentition normal, oropharynx normal, no oral lesions.  Hearing diminished. Skin: Skin warm and dry, no jaundice or suspicious rashes or lesions. Cardiac: Regular rate and rhythm, no murmurs, no lower extremity edema Respiratory: Easy and unlabored, no rales or wheezes. Abdomen: Abdomen soft without tenderness to palpation, ascites, or distention. MSK: No deformities or effusions of the large joints. Neuro: Sleeping, too sleepy to follow commands.    Psych: Falls back asleep quickly.    Data Reviewed: I have personally reviewed following labs and imaging studies:  CBC: Recent Labs  Lab 04/25/18 1402 04/25/18 1810 04/28/18 0549 04/29/18 0533  WBC 6.8 7.0 6.4 4.5  NEUTROABS  --  4.7  --   --   HGB 10.4* 10.2* 10.0* 9.9*  HCT 30.1* 30.8* 31.1* 30.6*  MCV 83.7 86.3 85.7 86.2  PLT 433.0* 412* 452* 650   Basic Metabolic Panel: Recent Labs  Lab 04/25/18 2205 04/26/18 0438 04/27/18 0559 04/28/18 0549 04/29/18 0533  NA 125* 125* 124* 126* 125*  K 3.9 4.0 3.9 3.8 4.1  CL 92* 93* 92* 92* 89*  CO2 24 25 25 26 27   GLUCOSE 94 92 98 106* 142*  BUN 12 10 11 12 14   CREATININE 0.62 0.59 0.62 0.64 0.71  CALCIUM 8.7* 8.7* 8.7* 8.8* 9.2   GFR: Estimated Creatinine Clearance: 55.5 mL/min (by C-G formula based on SCr of 0.71 mg/dL). Liver Function Tests: Recent Labs  Lab 04/25/18 1402  AST 40*  ALT 15  ALKPHOS 173*  BILITOT 0.4  PROT 6.8  ALBUMIN 3.8   No results for input(s): LIPASE, AMYLASE in the last 168 hours. No results for input(s): AMMONIA in the last 168 hours. Coagulation Profile: Recent Labs  Lab 04/29/18 0947  INR 0.95    Cardiac Enzymes: No results for input(s): CKTOTAL, CKMB, CKMBINDEX, TROPONINI in the last 168 hours. BNP (last 3 results) No results for input(s): PROBNP in the last 8760 hours. HbA1C: No results for input(s): HGBA1C in the last 72 hours. CBG: No results for input(s): GLUCAP in the last 168 hours. Lipid Profile: No results for input(s): CHOL, HDL, LDLCALC, TRIG, CHOLHDL, LDLDIRECT in the last 72 hours. Thyroid Function Tests: No results for input(s): TSH, T4TOTAL, FREET4, T3FREE, THYROIDAB in the last 72 hours. Anemia Panel: No results for input(s): VITAMINB12, FOLATE, FERRITIN, TIBC, IRON, RETICCTPCT in the last 72 hours. Urine analysis:    Component Value Date/Time   COLORURINE YELLOW 04/25/2018 1922   APPEARANCEUR CLEAR 04/25/2018 1922   LABSPEC 1.010 04/25/2018 [place] 7.0 04/25/2018 1922   GLUCOSEU NEGATIVE 04/25/2018 1922   GLUCOSEU NEGATIVE 01/28/2018 [place] 04/25/2018 1922   BILIRUBINUR NEGATIVE 04/25/2018 [place]  Negative 04/25/2018 1432   KETONESUR 5 (A) 04/25/2018 1922   PROTEINUR NEGATIVE 04/25/2018 1922   UROBILINOGEN 0.2 04/25/2018 1432   UROBILINOGEN 0.2 01/28/2018 1055   NITRITE NEGATIVE 04/25/2018 1922   LEUKOCYTESUR NEGATIVE 04/25/2018 1922   Sepsis Labs: @LABRCNTIP (procalcitonin:4,lacticacidven:4)  ) Recent Results (from the past 240 hour(s))  Urine Culture     Status: None   Collection Time: 04/25/18  2:02 PM  Result Value Ref Range Status   MICRO NUMBER: [id number]  Final   SPECIMEN QUALITY: ADEQUATE  Final   Sample Source NOT GIVEN  Final   STATUS: FINAL  Final   Result: No Growth  Final  Culture, Urine     Status: Abnormal   Collection Time: 04/25/18  9:35 PM  Result Value Ref Range Status   Specimen Description   Final    URINE, CLEAN CATCH Performed at [facility], [place] [address], [place], [place] [address]    Special Requests   Final    NONE Performed at [facility], [place] [address], [place], [place] [address]    Culture (A)  Final    <10,000 COLONIES/mL INSIGNIFICANT GROWTH Performed at [place] [address], [place], [place] [address]    Report Status 04/27/2018 FINAL  Final         Radiology Studies: Ct Chest W Contrast  Result Date: 04/29/2018 CLINICAL DATA:  76 year old female with history of metastatic disease to the spine. Unknown primary neoplasm. EXAM: CT CHEST, ABDOMEN, AND PELVIS WITH CONTRAST TECHNIQUE: Multidetector CT imaging of the chest, abdomen and pelvis was performed following the standard protocol during bolus administration of intravenous contrast. CONTRAST:  136mL OMNIPAQUE IOHEXOL 300 MG/ML  SOLN COMPARISON:  CT the abdomen and pelvis 04/01/2018. No prior chest CT. FINDINGS: CT CHEST FINDINGS Cardiovascular: Heart size is borderline enlarged. There is no significant pericardial fluid, thickening or pericardial calcification. There is aortic atherosclerosis, as well as atherosclerosis of the great vessels of the mediastinum and the coronary arteries, including calcified atherosclerotic plaque in the left main, left anterior descending, left circumflex and right coronary arteries. Mediastinum/Nodes: Multiple prominent borderline enlarged and enlarged mediastinal and bilateral hilar lymph nodes are noted. The largest of these include 2.3 cm low right paratracheal lymph node, 2.1 cm subcarinal lymph node, 1.9 cm AP window lymph node and a 2.2 cm left hilar lymph node. Esophagus is unremarkable in appearance. No axillary lymphadenopathy. Lungs/Pleura: Small bilateral pleural effusions lying dependently with some associated passive subsegmental atelectasis in the lower lobes of the lungs bilaterally. There is a background of interlobular septal thickening, suggesting underlying pulmonary edema. In the lateral aspect of the left major fissure there is a macrolobulated mass which measures 3.3 x 1.8 x 3.3 cm (axial image 77  of series 6 and sagittal image 136 of series 8) which appears to extend into both the left upper and lower lobes. Musculoskeletal: Innumerable predominantly lytic lesions are noted throughout the visualized axial and appendicular skeleton, compatible with widespread metastatic disease to the bones. There is an associated pathologic compression fracture of superior endplate of T9 with approximately 30% loss of anterior vertebral body height. CT ABDOMEN PELVIS FINDINGS Hepatobiliary: There are several hypovascular hepatic lesions in the liver measuring 3.8 x 2.7 cm in segment 8 (axial image 51 of series 2), 2.8 x 3.4 cm in segment 4B/5 (axial image 71 of series 2) and 1.5 x 1.5 cm in the central aspect of segment 4A (axial image 59 of series  2). No intra or extrahepatic biliary ductal dilatation. Gallbladder is moderately distended, but otherwise unremarkable in appearance. Pancreas: No pancreatic mass. No pancreatic ductal dilatation. No pancreatic or peripancreatic fluid or inflammatory changes. Spleen: Unremarkable. Adrenals/Urinary Tract: Subcentimeter low-attenuation lesions in the lower pole of the right kidney are too small to definitively characterize, but are statistically likely to represent tiny cysts. Left kidney and bilateral adrenal glands are normal in appearance. No hydroureteronephrosis. Urinary bladder is normal in appearance. Stomach/Bowel: The appearance of the stomach is normal. There is no pathologic dilatation of small bowel or colon. Numerous colonic diverticulae are noted, particularly in the sigmoid colon, without surrounding inflammatory changes to suggest an acute diverticulitis at this time. The appendix is not confidently identified and may be surgically absent. Regardless, there are no inflammatory changes noted adjacent to the cecum to suggest the presence of an acute appendicitis at this time. Vascular/Lymphatic: Aortic atherosclerosis, without evidence of aneurysm or dissection in the  abdominal or pelvic vasculature. No lymphadenopathy noted in the abdomen or pelvis. Reproductive: Uterus and ovaries are unremarkable in appearance. Other: No significant volume of ascites. No pneumoperitoneum. Musculoskeletal: Multiple small lytic lesions scattered throughout the visualized axial and appendicular skeleton, compatible with widespread metastatic disease to the bones. Nondisplaced healing fractures of the right superior and inferior pubic rami. Nondisplaced fractures through both sacral ala. IMPRESSION: 1. 3.3 x 1.8 x 3.3 cm mass in the lateral aspect of the left upper and lower lobes which appears to extend into both the left upper and lower lobes, with associated mediastinal and bilateral hilar lymphadenopathy, as detailed above, highly concerning for primary bronchogenic neoplasm. Further evaluation with PET-CT and/or biopsy is recommended in the near future for diagnostic and staging purposes. 2. 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 3. Heart size is borderline enlarged, and there is evidence of mild interstitial pulmonary edema and small bilateral pleural effusions; imaging findings suggestive of congestive heart failure. 4. Aortic atherosclerosis, in addition to left main and 3 vessel coronary artery disease. Assessment for potential risk factor modification, dietary therapy or pharmacologic therapy may be warranted, if clinically indicated. 5. Colonic diverticulosis without evidence of acute diverticulitis at this time. 6. Additional incidental findings, as above. Aortic Atherosclerosis (ICD10-I70.0). Electronically Signed   By: Vinnie Langton M.D.   On: 04/29/2018 07:31   Mr Thoracic Spine Wo Contrast  Addendum Date: 04/28/2018   ADDENDUM REPORT: 04/28/2018 16:32 ADDENDUM: These results were called by telephone at the time of interpretation on 04/28/2018 at 15:42 to Dr. Myrene Buddy , who verbally acknowledged these results. Electronically Signed   By:  Staci Righter M.D.   On: 04/28/2018 16:32   Result Date: 04/28/2018 CLINICAL DATA:  Back pain. EXAM: MRI THORACIC SPINE WITHOUT CONTRAST TECHNIQUE: Multiplanar, multisequence MR imaging of the thoracic spine was performed. No intravenous contrast was administered. COMPARISON:  Plain films 04/27/2018. FINDINGS: Alignment:  Anatomic Vertebrae: Widespread metastatic disease to the thoracic spine, near complete replacement T1, T6, T7, T9 and T12. Pathologic fracture of T9, loss of approximately 25-33% vertebral body height. Involvement of the pedicles and posterior elements at T6, extending to the spinous process. Partial vertebral body involvement with metastatic disease of T11, T2, and T5. Cord: Significant cord compression at T6, due to bulky dorsal epidural tumor, roughly 16 x 12 x 32 mm cross-section. No definite abnormal cord signal, but the patient is at risk for symptomatic cord compression. Slight posterior displacement of the posterior wall of T9 with epidural tumor ventrally  at this level, which does not result in significant stenosis or impingement. Paraspinal and other soft tissues: BILATERAL pleural effusions, greater on the LEFT. Suspected LEFT hilar mass. Disc levels: Minor disc pathology at multiple levels, not contributory. IMPRESSION: Widespread metastatic disease involving numerous thoracic vertebrae including T1, T2, T5, T6, T7, T9, T11, and T12. Pathologic fracture T9. Bulky dorsal epidural tumor at T6 results in significant cord compression. Neurosurgical consultation may be warranted. BILATERAL pleural effusions, greater on the LEFT with suspected LEFT hilar mass. Recommend CT chest with contrast for further evaluation. Furthermore, given the heterogeneous appearance of the liver on noncontrast CT from 04/01/2018, consider CT abdomen and pelvis with contrast for additional staging. A call has been placed to the ordering provider. Electronically Signed: By: Staci Righter M.D. On: 04/28/2018  15:31   Ct Abdomen Pelvis W Contrast  Result Date: 04/29/2018 CLINICAL DATA:  77 year old female with history of metastatic disease to the spine. Unknown primary neoplasm. EXAM: CT CHEST, ABDOMEN, AND PELVIS WITH CONTRAST TECHNIQUE: Multidetector CT imaging of the chest, abdomen and pelvis was performed following the standard protocol during bolus administration of intravenous contrast. CONTRAST:  178mL OMNIPAQUE IOHEXOL 300 MG/ML  SOLN COMPARISON:  CT the abdomen and pelvis 04/01/2018. No prior chest CT. FINDINGS: CT CHEST FINDINGS Cardiovascular: Heart size is borderline enlarged. There is no significant pericardial fluid, thickening or pericardial calcification. There is aortic atherosclerosis, as well as atherosclerosis of the great vessels of the mediastinum and the coronary arteries, including calcified atherosclerotic plaque in the left main, left anterior descending, left circumflex and right coronary arteries. Mediastinum/Nodes: Multiple prominent borderline enlarged and enlarged mediastinal and bilateral hilar lymph nodes are noted. The largest of these include 2.3 cm low right paratracheal lymph node, 2.1 cm subcarinal lymph node, 1.9 cm AP window lymph node and a 2.2 cm left hilar lymph node. Esophagus is unremarkable in appearance. No axillary lymphadenopathy. Lungs/Pleura: Small bilateral pleural effusions lying dependently with some associated passive subsegmental atelectasis in the lower lobes of the lungs bilaterally. There is a background of interlobular septal thickening, suggesting underlying pulmonary edema. In the lateral aspect of the left major fissure there is a macrolobulated mass which measures 3.3 x 1.8 x 3.3 cm (axial image 77 of series 6 and sagittal image 136 of series 8) which appears to extend into both the left upper and lower lobes. Musculoskeletal: Innumerable predominantly lytic lesions are noted throughout the visualized axial and appendicular skeleton, compatible with  widespread metastatic disease to the bones. There is an associated pathologic compression fracture of superior endplate of T9 with approximately 30% loss of anterior vertebral body height. CT ABDOMEN PELVIS FINDINGS Hepatobiliary: There are several hypovascular hepatic lesions in the liver measuring 3.8 x 2.7 cm in segment 8 (axial image 51 of series 2), 2.8 x 3.4 cm in segment 4B/5 (axial image 71 of series 2) and 1.5 x 1.5 cm in the central aspect of segment 4A (axial image 59 of series 2). No intra or extrahepatic biliary ductal dilatation. Gallbladder is moderately distended, but otherwise unremarkable in appearance. Pancreas: No pancreatic mass. No pancreatic ductal dilatation. No pancreatic or peripancreatic fluid or inflammatory changes. Spleen: Unremarkable. Adrenals/Urinary Tract: Subcentimeter low-attenuation lesions in the lower pole of the right kidney are too small to definitively characterize, but are statistically likely to represent tiny cysts. Left kidney and bilateral adrenal glands are normal in appearance. No hydroureteronephrosis. Urinary bladder is normal in appearance. Stomach/Bowel: The appearance of the stomach is normal. There is no pathologic  dilatation of small bowel or colon. Numerous colonic diverticulae are noted, particularly in the sigmoid colon, without surrounding inflammatory changes to suggest an acute diverticulitis at this time. The appendix is not confidently identified and may be surgically absent. Regardless, there are no inflammatory changes noted adjacent to the cecum to suggest the presence of an acute appendicitis at this time. Vascular/Lymphatic: Aortic atherosclerosis, without evidence of aneurysm or dissection in the abdominal or pelvic vasculature. No lymphadenopathy noted in the abdomen or pelvis. Reproductive: Uterus and ovaries are unremarkable in appearance. Other: No significant volume of ascites. No pneumoperitoneum. Musculoskeletal: Multiple small lytic  lesions scattered throughout the visualized axial and appendicular skeleton, compatible with widespread metastatic disease to the bones. Nondisplaced healing fractures of the right superior and inferior pubic rami. Nondisplaced fractures through both sacral ala. IMPRESSION: 1. 3.3 x 1.8 x 3.3 cm mass in the lateral aspect of the left upper and lower lobes which appears to extend into both the left upper and lower lobes, with associated mediastinal and bilateral hilar lymphadenopathy, as detailed above, highly concerning for primary bronchogenic neoplasm. Further evaluation with PET-CT and/or biopsy is recommended in the near future for diagnostic and staging purposes. 2. 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 3. Heart size is borderline enlarged, and there is evidence of mild interstitial pulmonary edema and small bilateral pleural effusions; imaging findings suggestive of congestive heart failure. 4. Aortic atherosclerosis, in addition to left main and 3 vessel coronary artery disease. Assessment for potential risk factor modification, dietary therapy or pharmacologic therapy may be warranted, if clinically indicated. 5. Colonic diverticulosis without evidence of acute diverticulitis at this time. 6. Additional incidental findings, as above. Aortic Atherosclerosis (ICD10-I70.0). Electronically Signed   By: Vinnie Langton M.D.   On: 04/29/2018 07:31        Scheduled Meds: . dexamethasone  4 mg Oral Q8H  . levothyroxine  75 mcg Oral QAC breakfast  . sodium chloride flush  10 mL Intravenous Q12H   Continuous Infusions:   LOS: 3 days    Time spent: 35 minutes   Edwin Dada, MD [facility] 04/29/2018, 3:02 PM     Pager [phone] --- please page though AMION:  [email or web address] If 7PM-7AM, please contact night-coverage

## 2018-04-29 NOTE — Progress Notes (Signed)
[facility]  Patient Status: Active (receiving services up to time of hospitalization)  [facility] is providing the following services: RN, PT and OT  If patient discharges after hours, please call [phone].   Edwinna Areola 04/29/2018, 9:47 AM

## 2018-04-29 NOTE — Procedures (Signed)
US guided core biopsy of liver lesion adjacent to gallbladder.  Liver is very subtle on [place].  4 cores obtained.  No immediate complication and minimal blood loss.

## 2018-04-29 NOTE — Progress Notes (Signed)
[place]         [phone] ________________________________  Initial inpatient Consultation  Name: [name] [name] MRN: [id number]  Date: 04/25/2018  DOB: June 06, 1941  REFERRING PHYSICIAN: No ref. provider found  DIAGNOSIS: 77 yo woman with T6 thoracic spinal cord compression from presumed lung cancer pending biopsy    ICD-10-CM   1. Hyponatremia E87.1   2. Back pain M54.9   3. Thoracic compression fracture (HCC) S22.000A CANCELED: IR Radiologist Eval & Mgmt    CANCELED: IR Radiologist Eval & Mgmt  4. Metastasis to vertebral column of unknown origin ([place]) C79.51 US BIOPSY (LIVER)   C80.1 US BIOPSY (LIVER)    CANCELED: IR Radiologist Eval & Mgmt    CANCELED: IR Radiologist Eval & Mgmt    HISTORY OF PRESENT ILLNESS::[name] is a 77 y.o. female who presented on 11/7 with at least a 6 week history of severe upper back pain, across the back.  X-ray ordered and shows thoracic compression fracture.  Subsequent MRI shows a bulky lesion in the posterior elements of T6 causing spinal cord compression:     Since the MRI findings were strongly suggestive of a new malignancy diagnosis, the patient underwent chest CT on 04/28/2018.  This showed a 3.3 cm left upper lung mass highly suspicious for bronchogenic carcinoma with metastatic disease to the skeleton and liver.  She has currently been referred today for discussion of potential radiation treatment options.  An US-guided liver biopsy is tentatively planned today for tissue diagnosis.  PREVIOUS RADIATION THERAPY: No  Past Medical History:  Diagnosis Date  . Asymptomatic varicose veins   . Cataract    beginnings  . Cigarette smoker   . COPD (chronic obstructive pulmonary disease) ([place])   . Diverticulosis of colon (without mention of hemorrhage)   . Headache(784.0)   . Hearing loss    wears hearing aides  . Lumbar back pain   . Pure hypercholesterolemia   . Stress disorder, acute   . Varicose  veins   :   Past Surgical History:  Procedure Laterality Date  . COLONOSCOPY  04-22-2004  . LUMBAR SPINE SURGERY     [phone]  . varicose vein treatment     at [facility]  :   Current Facility-Administered Medications:  .  ALPRAZolam Duanne Moron) tablet 0.5 mg, 0.5 mg, Oral, BID PRN, Edwin Dada, MD, 0.5 mg at 04/29/18 0809 .  dexamethasone (DECADRON) tablet 4 mg, 4 mg, Oral, Q8H, Danford, Suann Larry, MD, 4 mg at 04/30/18 1414 .  feeding supplement (ENSURE ENLIVE) (ENSURE ENLIVE) liquid 237 mL, 237 mL, Oral, BID BM, Danford, Christopher P, MD .  HYDROcodone-acetaminophen (NORCO/VICODIN) 5-325 MG per tablet 1 tablet, 1 tablet, Oral, Q4H PRN, Danford, Suann Larry, MD, 1 tablet at 04/30/18 1414 .  levothyroxine (SYNTHROID, LEVOTHROID) tablet 75 mcg, 75 mcg, Oral, QAC breakfast, Shela Leff, MD, 75 mcg at 04/30/18 0552 .  polyethylene glycol (MIRALAX / GLYCOLAX) packet 17 g, 17 g, Oral, Daily PRN, Shela Leff, MD, 17 g at 04/28/18 1011 .  senna (SENOKOT) tablet 8.6 mg, 1 tablet, Oral, Daily PRN, Edwin Dada, MD, 8.6 mg at 04/28/18 2227 .  sodium chloride flush (NS) 0.9 % injection 10 mL, 10 mL, Intravenous, Q12H, Danford, Suann Larry, MD, 10 mL at 04/29/18 2148:   Allergies  Allergen Reactions  . Atorvastatin     Intolerant to all statins   . Cyclobenzaprine Other (See Comments)    Bradycardia, hypotension  .  Ezetimibe      INTOL to Zetia  . Rosuvastatin     intolerant to all statins  . Gadolinium Derivatives Nausea Only    Pt became very nauseous after gadolinium administration//lh  . Nicoderm [Nicotine] Rash    Unable to use the patches---caused severe rash to area placed  :   Family History  Problem Relation Age of Onset  . Diabetes Father   . Heart attack Mother 81       nonsmoker  . Colon cancer Neg Hx   . Rectal cancer Neg Hx   . Stomach cancer Neg Hx   :   Social History   Socioeconomic History  . Marital  status: Married    Spouse name: Not on file  . Number of children: 2  . Years of education: Not on file  . Highest education level: Not on file  Occupational History  . Occupation: retired    Fish farm manager: RETIRED  Social Needs  . Financial resource strain: Not on file  . Food insecurity:    Worry: Not on file    Inability: Not on file  . Transportation needs:    Medical: Not on file    Non-medical: Not on file  Tobacco Use  . Smoking status: Current Every Day Smoker    Packs/day: 0.50    Years: 40.00    Pack years: 20.00    Types: Cigarettes  . Smokeless tobacco: Never Used  . Tobacco comment: 1/2-3/4 of a pk a day   Substance and Sexual Activity  . Alcohol use: No    Alcohol/week: 0.0 standard drinks  . Drug use: No  . Sexual activity: Not on file  Lifestyle  . Physical activity:    Days per week: Not on file    Minutes per session: Not on file  . Stress: Not on file  Relationships  . Social connections:    Talks on phone: Not on file    Gets together: Not on file    Attends religious service: Not on file    Active member of club or organization: Not on file    Attends meetings of clubs or organizations: Not on file    Relationship status: Not on file  . Intimate partner violence:    Fear of current or ex partner: Not on file    Emotionally abused: Not on file    Physically abused: Not on file    Forced sexual activity: Not on file  Other Topics Concern  . Not on file  Social History Narrative   Married 2008 (3rd marriage). 2 sons by first husband. 3 grandkids.       Retired Art gallery manager for 40 years      Hobbies: Designer, multimedia games, go to casino ([place]  :  REVIEW OF SYSTEMS:  A 15 point review of systems is documented in the electronic medical record. This was obtained by the nursing staff. However, I reviewed this with the patient to discuss relevant findings and make appropriate changes.  Pertinent items are noted in HPI.   PHYSICAL EXAM:    Blood pressure (!) 72/57, pulse 80, temperature 97.8 F (36.6 C), temperature source Oral, resp. rate 16, height 5\' 7"  (1.702 m), weight 141 lb 12.1 oz (64.3 kg), SpO2 93 %. Neuro intact motor strength and sensory in legs.  KPS = 50  100 - Normal; no complaints; no evidence of disease. 90   - Able to carry on normal activity; minor signs or symptoms  of disease. 80   - Normal activity with effort; some signs or symptoms of disease. 45   - Cares for self; unable to carry on normal activity or to do active work. 60   - Requires occasional assistance, but is able to care for most of his personal needs. 50   - Requires considerable assistance and frequent medical care. 68   - Disabled; requires special care and assistance. 24   - Severely disabled; hospital admission is indicated although death not imminent. 63   - Very sick; hospital admission necessary; active supportive treatment necessary. 10   - Moribund; fatal processes progressing rapidly. 0     - Dead  Karnofsky DA, Abelmann Bowie, Craver LS and Burchenal [facility] [phone]) The use of the nitrogen mustards in the palliative treatment of carcinoma: with particular reference to bronchogenic carcinoma Cancer 1 [id number]  LABORATORY DATA:  Lab Results  Component Value Date   WBC 7.5 04/30/2018   HGB 10.4 (L) 04/30/2018   HCT 31.6 (L) 04/30/2018   MCV 87.1 04/30/2018   PLT 434 (H) 04/30/2018   Lab Results  Component Value Date   NA 125 (L) 04/30/2018   K 3.9 04/30/2018   CL 89 (L) 04/30/2018   CO2 26 04/30/2018   Lab Results  Component Value Date   ALT 15 04/25/2018   AST 40 (H) 04/25/2018   ALKPHOS 173 (H) 04/25/2018   BILITOT 0.4 04/25/2018     RADIOGRAPHY: Ct Abdomen Pelvis Wo Contrast  Result Date: 04/01/2018 CLINICAL DATA:  Back pain possibly from a pubic ramus fracture. Urinary tract infection. No bowel movement in 1 week. Dysphagia to solids for couple of months. Hyponatremia. Smoker. EXAM: CT ABDOMEN AND PELVIS WITHOUT  CONTRAST TECHNIQUE: Multidetector CT imaging of the abdomen and pelvis was performed following the standard protocol without IV contrast. COMPARISON:  CT pelvis 02/27/2018 FINDINGS: Lower chest: Atelectasis and scarring in the lung bases. Mild bronchiectasis with some mucous plugging. Hepatobiliary: Evaluation of the liver is limited without IV contrast material but there are mildly hypodense poorly circumscribed lesions in the medial segment left lobe measuring 2.1 cm diameter and in the dome of the liver measuring 2.2 cm diameter. Density measurements are not consistent with a simple cyst. Consider possibility of metastasis or other solid lesion. Suggest initial characterization with contrast-enhanced CT or MRI. Gallbladder is distended without stone or inflammatory infiltration. No bile duct dilatation. Pancreas: Diffuse fatty infiltration of the pancreas. Spleen: Normal in size without focal abnormality. Adrenals/Urinary Tract: No adrenal gland nodules. Kidneys appear symmetrical. Mildly dilated right renal collecting system and right ureter with slight infiltration around the right kidney. No obstructing stone is identified. This could be sequela of recently passed stone or pyelonephritis. Small amount of gas in the bladder could arise from instrumentation or infection. No bladder wall thickening. Stomach/Bowel: Stomach and small bowel are decompressed. Diffusely stool-filled colon without distention. No wall thickening or inflammatory changes. Diverticulosis of the sigmoid colon without evidence of diverticulitis. Appendix is not identified. Vascular/Lymphatic: Aortic atherosclerosis. No enlarged abdominal or pelvic lymph nodes. Reproductive: Uterus and bilateral adnexa are unremarkable. Other: No abdominal wall hernia or abnormality. No abdominopelvic ascites. Musculoskeletal: Healing fractures of the right superior and inferior pubic rami and of the sacral ala bilaterally. Also fracture of the right  transverse process of L5. Degenerative changes throughout the spine. No destructive bone lesions. IMPRESSION: 1. Healing fractures of the right superior and inferior pubic rami and of the sacral ala bilaterally. Fracture of the right  transverse process of L5. 2. Indeterminate hypodense lesions in the medial segment left lobe of the liver measuring 2.1 cm diameter and 2.2 cm diameter. Suggest initial characterization with contrast-enhanced CT or MRI. 3. Mildly dilated right renal collecting system and right ureter with mild infiltration around the right kidney. No obstructing stone is identified. This could be sequela of recently passed stone or pyelonephritis. Small amount of gas in the bladder could arise from instrumentation or infection. 4. Diverticulosis of the sigmoid colon without evidence of diverticulitis. Aortic Atherosclerosis (ICD10-I70.0). Electronically Signed   By: Lucienne Capers M.D.   On: 04/01/2018 21:25   Dg Chest 2 View  Result Date: 04/27/2018 CLINICAL DATA:  Low back pain 3 months.  No trauma. EXAM: CHEST - 2 VIEW COMPARISON:  03/23/2018 and thoracic spine 02/01/2018 FINDINGS: Right-sided PICC line has tip over the SVC. Lungs are adequately inflated with hazy prominence of the perihilar markings suggesting interstitial edema. Small amount of posterior pleural fluid on the lateral film. Cardiomediastinal silhouette is within normal. Mild degenerate change of the spine. Mild T9 compression fracture new since 02/01/2018 but unchanged from the recent chest radiograph. IMPRESSION: Evidence of mild interstitial edema with small bilateral pleural effusions. Mild T9 compression fracture age indeterminate but new since 02/01/2018. Right-sided PICC line with tip over the SVC. Electronically Signed   By: Marin Olp M.D.   On: 04/27/2018 12:06   Dg Thoracic Spine 2 View  Result Date: 04/27/2018 CLINICAL DATA:  Back pain 3 months.  No trauma. EXAM: THORACIC SPINE 2 VIEWS COMPARISON:  Chest x-ray  03/23/2018 and thoracic spine 02/01/2018 FINDINGS: Right-sided PICC line with tip over the SVC. Vertebral body alignment is normal. Pedicles are intact. There is mild spondylosis throughout the thoracic spine. There is mild loss of height of T9 new since 02/01/2018. Remainder of the exam is unchanged. IMPRESSION: Mild T9 compression fracture age indeterminate but new since 02/01/2018. Mild spondylosis of the thoracic spine. Electronically Signed   By: Marin Olp M.D.   On: 04/27/2018 12:03   Dg Lumbar Spine 2-3 Views  Result Date: 04/27/2018 CLINICAL DATA:  Low back pain 3 months.  No trauma. EXAM: LUMBAR SPINE - 2-3 VIEW COMPARISON:  CT 04/01/2018 FINDINGS: Subtle curvature convex left. Vertebral body heights are maintained. There is moderate spondylosis throughout the lumbar spine to include facet arthropathy. Significant multilevel disc disease at all levels of the lumbar spine. No acute compression fracture or subluxation. Calcified plaque over the abdominal aorta. Evidence of patient's known right superior pubic ramus/acetabular fracture. IMPRESSION: Moderate spondylosis of the lumbar spine with moderate multilevel disc disease. Known right acetabular/superior pubic ramus fracture. Electronically Signed   By: Marin Olp M.D.   On: 04/27/2018 11:59   Ct Chest W Contrast  Result Date: 04/29/2018 CLINICAL DATA:  77 year old female with history of metastatic disease to the spine. Unknown primary neoplasm. EXAM: CT CHEST, ABDOMEN, AND PELVIS WITH CONTRAST TECHNIQUE: Multidetector CT imaging of the chest, abdomen and pelvis was performed following the standard protocol during bolus administration of intravenous contrast. CONTRAST:  135mL OMNIPAQUE IOHEXOL 300 MG/ML  SOLN COMPARISON:  CT the abdomen and pelvis 04/01/2018. No prior chest CT. FINDINGS: CT CHEST FINDINGS Cardiovascular: Heart size is borderline enlarged. There is no significant pericardial fluid, thickening or pericardial calcification.  There is aortic atherosclerosis, as well as atherosclerosis of the great vessels of the mediastinum and the coronary arteries, including calcified atherosclerotic plaque in the left main, left anterior descending, left circumflex and right coronary arteries.  Mediastinum/Nodes: Multiple prominent borderline enlarged and enlarged mediastinal and bilateral hilar lymph nodes are noted. The largest of these include 2.3 cm low right paratracheal lymph node, 2.1 cm subcarinal lymph node, 1.9 cm AP window lymph node and a 2.2 cm left hilar lymph node. Esophagus is unremarkable in appearance. No axillary lymphadenopathy. Lungs/Pleura: Small bilateral pleural effusions lying dependently with some associated passive subsegmental atelectasis in the lower lobes of the lungs bilaterally. There is a background of interlobular septal thickening, suggesting underlying pulmonary edema. In the lateral aspect of the left major fissure there is a macrolobulated mass which measures 3.3 x 1.8 x 3.3 cm (axial image 77 of series 6 and sagittal image 136 of series 8) which appears to extend into both the left upper and lower lobes. Musculoskeletal: Innumerable predominantly lytic lesions are noted throughout the visualized axial and appendicular skeleton, compatible with widespread metastatic disease to the bones. There is an associated pathologic compression fracture of superior endplate of T9 with approximately 30% loss of anterior vertebral body height. CT ABDOMEN PELVIS FINDINGS Hepatobiliary: There are several hypovascular hepatic lesions in the liver measuring 3.8 x 2.7 cm in segment 8 (axial image 51 of series 2), 2.8 x 3.4 cm in segment 4B/5 (axial image 71 of series 2) and 1.5 x 1.5 cm in the central aspect of segment 4A (axial image 59 of series 2). No intra or extrahepatic biliary ductal dilatation. Gallbladder is moderately distended, but otherwise unremarkable in appearance. Pancreas: No pancreatic mass. No pancreatic ductal  dilatation. No pancreatic or peripancreatic fluid or inflammatory changes. Spleen: Unremarkable. Adrenals/Urinary Tract: Subcentimeter low-attenuation lesions in the lower pole of the right kidney are too small to definitively characterize, but are statistically likely to represent tiny cysts. Left kidney and bilateral adrenal glands are normal in appearance. No hydroureteronephrosis. Urinary bladder is normal in appearance. Stomach/Bowel: The appearance of the stomach is normal. There is no pathologic dilatation of small bowel or colon. Numerous colonic diverticulae are noted, particularly in the sigmoid colon, without surrounding inflammatory changes to suggest an acute diverticulitis at this time. The appendix is not confidently identified and may be surgically absent. Regardless, there are no inflammatory changes noted adjacent to the cecum to suggest the presence of an acute appendicitis at this time. Vascular/Lymphatic: Aortic atherosclerosis, without evidence of aneurysm or dissection in the abdominal or pelvic vasculature. No lymphadenopathy noted in the abdomen or pelvis. Reproductive: Uterus and ovaries are unremarkable in appearance. Other: No significant volume of ascites. No pneumoperitoneum. Musculoskeletal: Multiple small lytic lesions scattered throughout the visualized axial and appendicular skeleton, compatible with widespread metastatic disease to the bones. Nondisplaced healing fractures of the right superior and inferior pubic rami. Nondisplaced fractures through both sacral ala. IMPRESSION: 1. 3.3 x 1.8 x 3.3 cm mass in the lateral aspect of the left upper and lower lobes which appears to extend into both the left upper and lower lobes, with associated mediastinal and bilateral hilar lymphadenopathy, as detailed above, highly concerning for primary bronchogenic neoplasm. Further evaluation with PET-CT and/or biopsy is recommended in the near future for diagnostic and staging purposes. 2.  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 3. Heart size is borderline enlarged, and there is evidence of mild interstitial pulmonary edema and small bilateral pleural effusions; imaging findings suggestive of congestive heart failure. 4. Aortic atherosclerosis, in addition to left main and 3 vessel coronary artery disease. Assessment for potential risk factor modification, dietary therapy or pharmacologic therapy may be warranted, if clinically  indicated. 5. Colonic diverticulosis without evidence of acute diverticulitis at this time. 6. Additional incidental findings, as above. Aortic Atherosclerosis (ICD10-I70.0). Electronically Signed   By: Vinnie Langton M.D.   On: 04/29/2018 07:31   Mr Thoracic Spine Wo Contrast  Addendum Date: 04/28/2018   ADDENDUM REPORT: 04/28/2018 16:32 ADDENDUM: These results were called by telephone at the time of interpretation on 04/28/2018 at 15:42 to Dr. Myrene Buddy , who verbally acknowledged these results. Electronically Signed   By: Staci Righter M.D.   On: 04/28/2018 16:32   Result Date: 04/28/2018 CLINICAL DATA:  Back pain. EXAM: MRI THORACIC SPINE WITHOUT CONTRAST TECHNIQUE: Multiplanar, multisequence MR imaging of the thoracic spine was performed. No intravenous contrast was administered. COMPARISON:  Plain films 04/27/2018. FINDINGS: Alignment:  Anatomic Vertebrae: Widespread metastatic disease to the thoracic spine, near complete replacement T1, T6, T7, T9 and T12. Pathologic fracture of T9, loss of approximately 25-33% vertebral body height. Involvement of the pedicles and posterior elements at T6, extending to the spinous process. Partial vertebral body involvement with metastatic disease of T11, T2, and T5. Cord: Significant cord compression at T6, due to bulky dorsal epidural tumor, roughly 16 x 12 x 32 mm cross-section. No definite abnormal cord signal, but the patient is at risk for symptomatic cord compression. Slight posterior  displacement of the posterior wall of T9 with epidural tumor ventrally at this level, which does not result in significant stenosis or impingement. Paraspinal and other soft tissues: BILATERAL pleural effusions, greater on the LEFT. Suspected LEFT hilar mass. Disc levels: Minor disc pathology at multiple levels, not contributory. IMPRESSION: Widespread metastatic disease involving numerous thoracic vertebrae including T1, T2, T5, T6, T7, T9, T11, and T12. Pathologic fracture T9. Bulky dorsal epidural tumor at T6 results in significant cord compression. Neurosurgical consultation may be warranted. BILATERAL pleural effusions, greater on the LEFT with suspected LEFT hilar mass. Recommend CT chest with contrast for further evaluation. Furthermore, given the heterogeneous appearance of the liver on noncontrast CT from 04/01/2018, consider CT abdomen and pelvis with contrast for additional staging. A call has been placed to the ordering provider. Electronically Signed: By: Staci Righter M.D. On: 04/28/2018 15:31   Ct Abdomen Pelvis W Contrast  Result Date: 04/29/2018 CLINICAL DATA:  77 year old female with history of metastatic disease to the spine. Unknown primary neoplasm. EXAM: CT CHEST, ABDOMEN, AND PELVIS WITH CONTRAST TECHNIQUE: Multidetector CT imaging of the chest, abdomen and pelvis was performed following the standard protocol during bolus administration of intravenous contrast. CONTRAST:  182mL OMNIPAQUE IOHEXOL 300 MG/ML  SOLN COMPARISON:  CT the abdomen and pelvis 04/01/2018. No prior chest CT. FINDINGS: CT CHEST FINDINGS Cardiovascular: Heart size is borderline enlarged. There is no significant pericardial fluid, thickening or pericardial calcification. There is aortic atherosclerosis, as well as atherosclerosis of the great vessels of the mediastinum and the coronary arteries, including calcified atherosclerotic plaque in the left main, left anterior descending, left circumflex and right coronary  arteries. Mediastinum/Nodes: Multiple prominent borderline enlarged and enlarged mediastinal and bilateral hilar lymph nodes are noted. The largest of these include 2.3 cm low right paratracheal lymph node, 2.1 cm subcarinal lymph node, 1.9 cm AP window lymph node and a 2.2 cm left hilar lymph node. Esophagus is unremarkable in appearance. No axillary lymphadenopathy. Lungs/Pleura: Small bilateral pleural effusions lying dependently with some associated passive subsegmental atelectasis in the lower lobes of the lungs bilaterally. There is a background of interlobular septal thickening, suggesting underlying pulmonary edema. In the lateral aspect of  the left major fissure there is a macrolobulated mass which measures 3.3 x 1.8 x 3.3 cm (axial image 77 of series 6 and sagittal image 136 of series 8) which appears to extend into both the left upper and lower lobes. Musculoskeletal: Innumerable predominantly lytic lesions are noted throughout the visualized axial and appendicular skeleton, compatible with widespread metastatic disease to the bones. There is an associated pathologic compression fracture of superior endplate of T9 with approximately 30% loss of anterior vertebral body height. CT ABDOMEN PELVIS FINDINGS Hepatobiliary: There are several hypovascular hepatic lesions in the liver measuring 3.8 x 2.7 cm in segment 8 (axial image 51 of series 2), 2.8 x 3.4 cm in segment 4B/5 (axial image 71 of series 2) and 1.5 x 1.5 cm in the central aspect of segment 4A (axial image 59 of series 2). No intra or extrahepatic biliary ductal dilatation. Gallbladder is moderately distended, but otherwise unremarkable in appearance. Pancreas: No pancreatic mass. No pancreatic ductal dilatation. No pancreatic or peripancreatic fluid or inflammatory changes. Spleen: Unremarkable. Adrenals/Urinary Tract: Subcentimeter low-attenuation lesions in the lower pole of the right kidney are too small to definitively characterize, but are  statistically likely to represent tiny cysts. Left kidney and bilateral adrenal glands are normal in appearance. No hydroureteronephrosis. Urinary bladder is normal in appearance. Stomach/Bowel: The appearance of the stomach is normal. There is no pathologic dilatation of small bowel or colon. Numerous colonic diverticulae are noted, particularly in the sigmoid colon, without surrounding inflammatory changes to suggest an acute diverticulitis at this time. The appendix is not confidently identified and may be surgically absent. Regardless, there are no inflammatory changes noted adjacent to the cecum to suggest the presence of an acute appendicitis at this time. Vascular/Lymphatic: Aortic atherosclerosis, without evidence of aneurysm or dissection in the abdominal or pelvic vasculature. No lymphadenopathy noted in the abdomen or pelvis. Reproductive: Uterus and ovaries are unremarkable in appearance. Other: No significant volume of ascites. No pneumoperitoneum. Musculoskeletal: Multiple small lytic lesions scattered throughout the visualized axial and appendicular skeleton, compatible with widespread metastatic disease to the bones. Nondisplaced healing fractures of the right superior and inferior pubic rami. Nondisplaced fractures through both sacral ala. IMPRESSION: 1. 3.3 x 1.8 x 3.3 cm mass in the lateral aspect of the left upper and lower lobes which appears to extend into both the left upper and lower lobes, with associated mediastinal and bilateral hilar lymphadenopathy, as detailed above, highly concerning for primary bronchogenic neoplasm. Further evaluation with PET-CT and/or biopsy is recommended in the near future for diagnostic and staging purposes. 2. 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 3. Heart size is borderline enlarged, and there is evidence of mild interstitial pulmonary edema and small bilateral pleural effusions; imaging findings suggestive of congestive heart  failure. 4. Aortic atherosclerosis, in addition to left main and 3 vessel coronary artery disease. Assessment for potential risk factor modification, dietary therapy or pharmacologic therapy may be warranted, if clinically indicated. 5. Colonic diverticulosis without evidence of acute diverticulitis at this time. 6. Additional incidental findings, as above. Aortic Atherosclerosis (ICD10-I70.0). Electronically Signed   By: Vinnie Langton M.D.   On: 04/29/2018 07:31   US Biopsy (liver)  Result Date: 04/29/2018 INDICATION: 77 year old with chest lymphadenopathy, bone lesions and liver lesions. Findings are suspicious for metastatic disease and tissue diagnosis is needed. EXAM: ULTRASOUND-GUIDED LIVER LESION BIOPSY MEDICATIONS: None. ANESTHESIA/SEDATION: Moderate (conscious) sedation was employed during this procedure. A total of Versed 1 mg and Fentanyl 50 mcg was  administered intravenously. Moderate Sedation Time: 15 minutes. The patient's level of consciousness and vital signs were monitored continuously by radiology nursing throughout the procedure under my direct supervision. FLUOROSCOPY TIME:  None COMPLICATIONS: None immediate. PROCEDURE: Informed written consent was obtained from the patient after a thorough discussion of the procedural risks, benefits and alternatives. All questions were addressed. A timeout was performed prior to the initiation of the procedure. Ultrasound was used to evaluate the liver. A very subtle isoechoic lesion was identified in the segment 4B region adjacent to the gallbladder. This appeared to correspond with the recent CT findings. The right abdomen was prepped and draped in sterile fashion. Maximal barrier sterile technique was utilized including mask, sterile gowns, sterile gloves, sterile drape, hand hygiene and skin antiseptic. Skin was anesthetized with 1% lidocaine. 17 gauge needle directed into the subtle lesion with ultrasound guidance. Four core biopsies obtained with  an 18 gauge device. Specimens placed in formalin. Needle was removed without complication. FINDINGS: Very subtle isoechoic lesion adjacent to the gallbladder. This area did correspond with the recent CT findings. Four small core biopsies were obtained. No significant bleeding or hematoma formation after the core biopsies. IMPRESSION: Ultrasound-guided core biopsies of the subtle hepatic lesion. Visualization of this lesion was very difficult and a false negative biopsy is a possibility. Electronically Signed   By: Markus Daft M.D.   On: 04/29/2018 18:47   [place] Ekg Site Rite  Result Date: 04/06/2018 If Site Rite image not attached, placement could not be confirmed due to current cardiac rhythm.     IMPRESSION:  77 yo woman with T6 thoracic spinal cord compression from presumed lung cancer pending biopsy.  She may benefit from palliative radiotherapy versus surgical decompression of T6 depending on tissue type.  PLAN:Today, I talked to the patient and family about the findings and work-up thus far.  We discussed the natural history of spinal cord compression from cancer and general treatment, highlighting the role of radiotherapy in the management.  We discussed the available radiation techniques, and focused on the details of logistics and delivery.  We will await pathology and anticipate possible radiation planning later this week, Thursday.   I spent time face to face with the patient and more than 50% of that time was spent in counseling and/or coordination of care.      Nicholos Johns, PA-C    Tyler Pita, MD  [facility] Direct Dial: [phone]  Fax: [phone] [email or web address]  Skype  LinkedIn

## 2018-04-29 NOTE — Consult Note (Signed)
Chief Complaint: Patient was seen in consultation today for image guided liver lesion biopsy Chief Complaint  Patient presents with  . Abnormal Lab    Referring Physician(s): Danford,C  Supervising Physician: Markus Daft  Patient Status: [name] [name] - In-pt  History of Present Illness: [name] [name] is a 77 y.o. female with history of tobacco abuse, COPD, diverticulosis, HTN, hearing loss, hypothyroidism and recent pubic ramus fractures who presented to [facility] 11/7 with persistent hyponatremia, E coli UTI and back pain.  CT scan chest abdomen pelvis performed yesterday revealed:   1. 3.3 x 1.8 x 3.3 cm mass in the lateral aspect of the left upper and lower lobes which appears to extend into both the left upper and lower lobes, with associated mediastinal and bilateral hilar lymphadenopathy, as detailed above, highly concerning for primary bronchogenic neoplasm. Further evaluation with PET-CT and/or biopsy is recommended in the near future for diagnostic and staging purposes. 2. 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 3. Heart size is borderline enlarged, and there is evidence of mild interstitial pulmonary edema and small bilateral pleural effusions; imaging findings suggestive of congestive heart failure. 4. Aortic atherosclerosis, in addition to left main and 3 vessel coronary artery disease.  5. Colonic diverticulosis without evidence of acute diverticulitis at this time.  MRI thoracic spine performed yesterday revealed :   widespread metastatic disease involving numerous thoracic vertebrae including T1, T2, T5, T6, T7, T9, T11, and T12. Pathologic fracture T9.  Bulky dorsal epidural tumor at T6 results in significant cord compression. Neurosurgical consultation may be warranted.  BILATERAL pleural effusions, greater on the LEFT with suspected LEFT hilar mass.  Neurosurgical consultation pending.  Request now received for image guided biopsy for  further evaluation.  Past Medical History:  Diagnosis Date  . Asymptomatic varicose veins   . Cataract    beginnings  . Cigarette smoker   . COPD (chronic obstructive pulmonary disease) ([place])   . Diverticulosis of colon (without mention of hemorrhage)   . Headache(784.0)   . Hearing loss    wears hearing aides  . Lumbar back pain   . Pure hypercholesterolemia   . Stress disorder, acute   . Varicose veins     Past Surgical History:  Procedure Laterality Date  . COLONOSCOPY  04-22-2004  . LUMBAR SPINE SURGERY     [phone]  . varicose vein treatment     at [facility]    Allergies: Atorvastatin; Cyclobenzaprine; Ezetimibe; Rosuvastatin; Gadolinium derivatives; and Nicoderm [nicotine]  Medications: Prior to Admission medications   Medication Sig Start Date End Date Taking? Authorizing Provider  HYDROcodone-acetaminophen (NORCO/VICODIN) 5-325 MG tablet Take 0.5 tablets by mouth every 6 (six) hours as needed for moderate pain or severe pain.   Yes [provider]  levothyroxine (SYNTHROID, LEVOTHROID) 75 MCG tablet TAKE 1 TABLET(75 MCG) BY MOUTH DAILY Patient taking differently: Take 75 mcg by mouth daily before breakfast.  04/03/18  Yes Marin Olp, MD  nitrofurantoin, macrocrystal-monohydrate, (MACROBID) 100 MG capsule Take 1 capsule (100 mg total) by mouth 2 (two) times daily for 7 days. 04/22/18 04/29/18 Yes Marin Olp, MD  omeprazole (PRILOSEC) 20 MG capsule Take 1 capsule (20 mg total) by mouth daily. 01/28/18  Yes Inda Coke, PA  senna (SENOKOT) 8.6 MG TABS tablet Take 1 tablet (8.6 mg total) by mouth 2 (two) times daily. 03/26/18  Yes Patrecia Pour, MD  nicotine polacrilex (NICORETTE) 2 MG gum Take 1 each (2 mg total) by  mouth as needed for smoking cessation. Patient not taking: Reported on 04/25/2018 03/26/18   Patrecia Pour, MD  ondansetron (ZOFRAN-ODT) 4 MG disintegrating tablet Take 1 tablet (4 mg total) by mouth every 8 (eight) hours as  needed for nausea or vomiting. 03/04/18   Marin Olp, MD  polyethylene glycol (MIRALAX / Floria Raveling) packet Take 17 g by mouth daily. Patient taking differently: Take 17 g by mouth daily as needed for mild constipation.  03/26/18   Patrecia Pour, MD     Family History  Problem Relation Age of Onset  . Diabetes Father   . Heart attack Mother 24       nonsmoker  . Colon cancer Neg Hx   . Rectal cancer Neg Hx   . Stomach cancer Neg Hx     Social History   Socioeconomic History  . Marital status: Married    Spouse name: Not on file  . Number of children: 2  . Years of education: Not on file  . Highest education level: Not on file  Occupational History  . Occupation: retired    Fish farm manager: RETIRED  Social Needs  . Financial resource strain: Not on file  . Food insecurity:    Worry: Not on file    Inability: Not on file  . Transportation needs:    Medical: Not on file    Non-medical: Not on file  Tobacco Use  . Smoking status: Current Every Day Smoker    Packs/day: 0.50    Years: 40.00    Pack years: 20.00    Types: Cigarettes  . Smokeless tobacco: Never Used  . Tobacco comment: 1/2-3/4 of a pk a day   Substance and Sexual Activity  . Alcohol use: No    Alcohol/week: 0.0 standard drinks  . Drug use: No  . Sexual activity: Not on file  Lifestyle  . Physical activity:    Days per week: Not on file    Minutes per session: Not on file  . Stress: Not on file  Relationships  . Social connections:    Talks on phone: Not on file    Gets together: Not on file    Attends religious service: Not on file    Active member of club or organization: Not on file    Attends meetings of clubs or organizations: Not on file    Relationship status: Not on file  Other Topics Concern  . Not on file  Social History Narrative   Married 2008 (3rd marriage). 2 sons by first husband. 3 grandkids.       Retired Art gallery manager for 40 years      Hobbies: Designer, multimedia games, go  to casino ([place]      Review of Systems currently denies fever, headache, chest pain, dyspnea, cough, abdominal pain, nausea, vomiting or bleeding.  She does have back pain and hearing loss.  Vital Signs: BP 133/79 (BP Location: Left Arm)   Pulse 85   Temp 97.9 F (36.6 C) (Oral)   Resp 18   Ht 5\' 7"  (1.702 m)   Wt 141 lb 12.1 oz (64.3 kg)   SpO2 92%   BMI 22.20 kg/m   Physical Exam awake, conversant, answering all questions; son in room; chest with some diminished breath sounds bases.  Heart with regular rate and rhythm.  Abdomen soft, positive bowel sounds, nontender.No LE edema.  Imaging: Ct Abdomen Pelvis Wo Contrast  Result Date: 04/01/2018 CLINICAL DATA:  Back pain possibly from  a pubic ramus fracture. Urinary tract infection. No bowel movement in 1 week. Dysphagia to solids for couple of months. Hyponatremia. Smoker. EXAM: CT ABDOMEN AND PELVIS WITHOUT CONTRAST TECHNIQUE: Multidetector CT imaging of the abdomen and pelvis was performed following the standard protocol without IV contrast. COMPARISON:  CT pelvis 02/27/2018 FINDINGS: Lower chest: Atelectasis and scarring in the lung bases. Mild bronchiectasis with some mucous plugging. Hepatobiliary: Evaluation of the liver is limited without IV contrast material but there are mildly hypodense poorly circumscribed lesions in the medial segment left lobe measuring 2.1 cm diameter and in the dome of the liver measuring 2.2 cm diameter. Density measurements are not consistent with a simple cyst. Consider possibility of metastasis or other solid lesion. Suggest initial characterization with contrast-enhanced CT or MRI. Gallbladder is distended without stone or inflammatory infiltration. No bile duct dilatation. Pancreas: Diffuse fatty infiltration of the pancreas. Spleen: Normal in size without focal abnormality. Adrenals/Urinary Tract: No adrenal gland nodules. Kidneys appear symmetrical. Mildly dilated right renal collecting system and  right ureter with slight infiltration around the right kidney. No obstructing stone is identified. This could be sequela of recently passed stone or pyelonephritis. Small amount of gas in the bladder could arise from instrumentation or infection. No bladder wall thickening. Stomach/Bowel: Stomach and small bowel are decompressed. Diffusely stool-filled colon without distention. No wall thickening or inflammatory changes. Diverticulosis of the sigmoid colon without evidence of diverticulitis. Appendix is not identified. Vascular/Lymphatic: Aortic atherosclerosis. No enlarged abdominal or pelvic lymph nodes. Reproductive: Uterus and bilateral adnexa are unremarkable. Other: No abdominal wall hernia or abnormality. No abdominopelvic ascites. Musculoskeletal: Healing fractures of the right superior and inferior pubic rami and of the sacral ala bilaterally. Also fracture of the right transverse process of L5. Degenerative changes throughout the spine. No destructive bone lesions. IMPRESSION: 1. Healing fractures of the right superior and inferior pubic rami and of the sacral ala bilaterally. Fracture of the right transverse process of L5. 2. Indeterminate hypodense lesions in the medial segment left lobe of the liver measuring 2.1 cm diameter and 2.2 cm diameter. Suggest initial characterization with contrast-enhanced CT or MRI. 3. Mildly dilated right renal collecting system and right ureter with mild infiltration around the right kidney. No obstructing stone is identified. This could be sequela of recently passed stone or pyelonephritis. Small amount of gas in the bladder could arise from instrumentation or infection. 4. Diverticulosis of the sigmoid colon without evidence of diverticulitis. Aortic Atherosclerosis (ICD10-I70.0). Electronically Signed   By: Lucienne Capers M.D.   On: 04/01/2018 21:25   Dg Chest 2 View  Result Date: 04/27/2018 CLINICAL DATA:  Low back pain 3 months.  No trauma. EXAM: CHEST - 2 VIEW  COMPARISON:  03/23/2018 and thoracic spine 02/01/2018 FINDINGS: Right-sided PICC line has tip over the SVC. Lungs are adequately inflated with hazy prominence of the perihilar markings suggesting interstitial edema. Small amount of posterior pleural fluid on the lateral film. Cardiomediastinal silhouette is within normal. Mild degenerate change of the spine. Mild T9 compression fracture new since 02/01/2018 but unchanged from the recent chest radiograph. IMPRESSION: Evidence of mild interstitial edema with small bilateral pleural effusions. Mild T9 compression fracture age indeterminate but new since 02/01/2018. Right-sided PICC line with tip over the SVC. Electronically Signed   By: Marin Olp M.D.   On: 04/27/2018 12:06   Dg Thoracic Spine 2 View  Result Date: 04/27/2018 CLINICAL DATA:  Back pain 3 months.  No trauma. EXAM: THORACIC SPINE 2 VIEWS COMPARISON:  Chest x-ray 03/23/2018 and thoracic spine 02/01/2018 FINDINGS: Right-sided PICC line with tip over the SVC. Vertebral body alignment is normal. Pedicles are intact. There is mild spondylosis throughout the thoracic spine. There is mild loss of height of T9 new since 02/01/2018. Remainder of the exam is unchanged. IMPRESSION: Mild T9 compression fracture age indeterminate but new since 02/01/2018. Mild spondylosis of the thoracic spine. Electronically Signed   By: Marin Olp M.D.   On: 04/27/2018 12:03   Dg Lumbar Spine 2-3 Views  Result Date: 04/27/2018 CLINICAL DATA:  Low back pain 3 months.  No trauma. EXAM: LUMBAR SPINE - 2-3 VIEW COMPARISON:  CT 04/01/2018 FINDINGS: Subtle curvature convex left. Vertebral body heights are maintained. There is moderate spondylosis throughout the lumbar spine to include facet arthropathy. Significant multilevel disc disease at all levels of the lumbar spine. No acute compression fracture or subluxation. Calcified plaque over the abdominal aorta. Evidence of patient's known right superior pubic ramus/acetabular  fracture. IMPRESSION: Moderate spondylosis of the lumbar spine with moderate multilevel disc disease. Known right acetabular/superior pubic ramus fracture. Electronically Signed   By: Marin Olp M.D.   On: 04/27/2018 11:59   Ct Chest W Contrast  Result Date: 04/29/2018 CLINICAL DATA:  77 year old female with history of metastatic disease to the spine. Unknown primary neoplasm. EXAM: CT CHEST, ABDOMEN, AND PELVIS WITH CONTRAST TECHNIQUE: Multidetector CT imaging of the chest, abdomen and pelvis was performed following the standard protocol during bolus administration of intravenous contrast. CONTRAST:  131mL OMNIPAQUE IOHEXOL 300 MG/ML  SOLN COMPARISON:  CT the abdomen and pelvis 04/01/2018. No prior chest CT. FINDINGS: CT CHEST FINDINGS Cardiovascular: Heart size is borderline enlarged. There is no significant pericardial fluid, thickening or pericardial calcification. There is aortic atherosclerosis, as well as atherosclerosis of the great vessels of the mediastinum and the coronary arteries, including calcified atherosclerotic plaque in the left main, left anterior descending, left circumflex and right coronary arteries. Mediastinum/Nodes: Multiple prominent borderline enlarged and enlarged mediastinal and bilateral hilar lymph nodes are noted. The largest of these include 2.3 cm low right paratracheal lymph node, 2.1 cm subcarinal lymph node, 1.9 cm AP window lymph node and a 2.2 cm left hilar lymph node. Esophagus is unremarkable in appearance. No axillary lymphadenopathy. Lungs/Pleura: Small bilateral pleural effusions lying dependently with some associated passive subsegmental atelectasis in the lower lobes of the lungs bilaterally. There is a background of interlobular septal thickening, suggesting underlying pulmonary edema. In the lateral aspect of the left major fissure there is a macrolobulated mass which measures 3.3 x 1.8 x 3.3 cm (axial image 77 of series 6 and sagittal image 136 of series 8)  which appears to extend into both the left upper and lower lobes. Musculoskeletal: Innumerable predominantly lytic lesions are noted throughout the visualized axial and appendicular skeleton, compatible with widespread metastatic disease to the bones. There is an associated pathologic compression fracture of superior endplate of T9 with approximately 30% loss of anterior vertebral body height. CT ABDOMEN PELVIS FINDINGS Hepatobiliary: There are several hypovascular hepatic lesions in the liver measuring 3.8 x 2.7 cm in segment 8 (axial image 51 of series 2), 2.8 x 3.4 cm in segment 4B/5 (axial image 71 of series 2) and 1.5 x 1.5 cm in the central aspect of segment 4A (axial image 59 of series 2). No intra or extrahepatic biliary ductal dilatation. Gallbladder is moderately distended, but otherwise unremarkable in appearance. Pancreas: No pancreatic mass. No pancreatic ductal dilatation. No pancreatic or peripancreatic fluid or inflammatory  changes. Spleen: Unremarkable. Adrenals/Urinary Tract: Subcentimeter low-attenuation lesions in the lower pole of the right kidney are too small to definitively characterize, but are statistically likely to represent tiny cysts. Left kidney and bilateral adrenal glands are normal in appearance. No hydroureteronephrosis. Urinary bladder is normal in appearance. Stomach/Bowel: The appearance of the stomach is normal. There is no pathologic dilatation of small bowel or colon. Numerous colonic diverticulae are noted, particularly in the sigmoid colon, without surrounding inflammatory changes to suggest an acute diverticulitis at this time. The appendix is not confidently identified and may be surgically absent. Regardless, there are no inflammatory changes noted adjacent to the cecum to suggest the presence of an acute appendicitis at this time. Vascular/Lymphatic: Aortic atherosclerosis, without evidence of aneurysm or dissection in the abdominal or pelvic vasculature. No  lymphadenopathy noted in the abdomen or pelvis. Reproductive: Uterus and ovaries are unremarkable in appearance. Other: No significant volume of ascites. No pneumoperitoneum. Musculoskeletal: Multiple small lytic lesions scattered throughout the visualized axial and appendicular skeleton, compatible with widespread metastatic disease to the bones. Nondisplaced healing fractures of the right superior and inferior pubic rami. Nondisplaced fractures through both sacral ala. IMPRESSION: 1. 3.3 x 1.8 x 3.3 cm mass in the lateral aspect of the left upper and lower lobes which appears to extend into both the left upper and lower lobes, with associated mediastinal and bilateral hilar lymphadenopathy, as detailed above, highly concerning for primary bronchogenic neoplasm. Further evaluation with PET-CT and/or biopsy is recommended in the near future for diagnostic and staging purposes. 2. 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 3. Heart size is borderline enlarged, and there is evidence of mild interstitial pulmonary edema and small bilateral pleural effusions; imaging findings suggestive of congestive heart failure. 4. Aortic atherosclerosis, in addition to left main and 3 vessel coronary artery disease. Assessment for potential risk factor modification, dietary therapy or pharmacologic therapy may be warranted, if clinically indicated. 5. Colonic diverticulosis without evidence of acute diverticulitis at this time. 6. Additional incidental findings, as above. Aortic Atherosclerosis (ICD10-I70.0). Electronically Signed   By: Vinnie Langton M.D.   On: 04/29/2018 07:31   Mr Thoracic Spine Wo Contrast  Addendum Date: 04/28/2018   ADDENDUM REPORT: 04/28/2018 16:32 ADDENDUM: These results were called by telephone at the time of interpretation on 04/28/2018 at 15:42 to Dr. Myrene Buddy , who verbally acknowledged these results. Electronically Signed   By: Staci Righter M.D.   On: 04/28/2018  16:32   Result Date: 04/28/2018 CLINICAL DATA:  Back pain. EXAM: MRI THORACIC SPINE WITHOUT CONTRAST TECHNIQUE: Multiplanar, multisequence MR imaging of the thoracic spine was performed. No intravenous contrast was administered. COMPARISON:  Plain films 04/27/2018. FINDINGS: Alignment:  Anatomic Vertebrae: Widespread metastatic disease to the thoracic spine, near complete replacement T1, T6, T7, T9 and T12. Pathologic fracture of T9, loss of approximately 25-33% vertebral body height. Involvement of the pedicles and posterior elements at T6, extending to the spinous process. Partial vertebral body involvement with metastatic disease of T11, T2, and T5. Cord: Significant cord compression at T6, due to bulky dorsal epidural tumor, roughly 16 x 12 x 32 mm cross-section. No definite abnormal cord signal, but the patient is at risk for symptomatic cord compression. Slight posterior displacement of the posterior wall of T9 with epidural tumor ventrally at this level, which does not result in significant stenosis or impingement. Paraspinal and other soft tissues: BILATERAL pleural effusions, greater on the LEFT. Suspected LEFT hilar mass. Disc levels: Minor disc  pathology at multiple levels, not contributory. IMPRESSION: Widespread metastatic disease involving numerous thoracic vertebrae including T1, T2, T5, T6, T7, T9, T11, and T12. Pathologic fracture T9. Bulky dorsal epidural tumor at T6 results in significant cord compression. Neurosurgical consultation may be warranted. BILATERAL pleural effusions, greater on the LEFT with suspected LEFT hilar mass. Recommend CT chest with contrast for further evaluation. Furthermore, given the heterogeneous appearance of the liver on noncontrast CT from 04/01/2018, consider CT abdomen and pelvis with contrast for additional staging. A call has been placed to the ordering provider. Electronically Signed: By: Staci Righter M.D. On: 04/28/2018 15:31   Ct Abdomen Pelvis W  Contrast  Result Date: 04/29/2018 CLINICAL DATA:  77 year old female with history of metastatic disease to the spine. Unknown primary neoplasm. EXAM: CT CHEST, ABDOMEN, AND PELVIS WITH CONTRAST TECHNIQUE: Multidetector CT imaging of the chest, abdomen and pelvis was performed following the standard protocol during bolus administration of intravenous contrast. CONTRAST:  157mL OMNIPAQUE IOHEXOL 300 MG/ML  SOLN COMPARISON:  CT the abdomen and pelvis 04/01/2018. No prior chest CT. FINDINGS: CT CHEST FINDINGS Cardiovascular: Heart size is borderline enlarged. There is no significant pericardial fluid, thickening or pericardial calcification. There is aortic atherosclerosis, as well as atherosclerosis of the great vessels of the mediastinum and the coronary arteries, including calcified atherosclerotic plaque in the left main, left anterior descending, left circumflex and right coronary arteries. Mediastinum/Nodes: Multiple prominent borderline enlarged and enlarged mediastinal and bilateral hilar lymph nodes are noted. The largest of these include 2.3 cm low right paratracheal lymph node, 2.1 cm subcarinal lymph node, 1.9 cm AP window lymph node and a 2.2 cm left hilar lymph node. Esophagus is unremarkable in appearance. No axillary lymphadenopathy. Lungs/Pleura: Small bilateral pleural effusions lying dependently with some associated passive subsegmental atelectasis in the lower lobes of the lungs bilaterally. There is a background of interlobular septal thickening, suggesting underlying pulmonary edema. In the lateral aspect of the left major fissure there is a macrolobulated mass which measures 3.3 x 1.8 x 3.3 cm (axial image 77 of series 6 and sagittal image 136 of series 8) which appears to extend into both the left upper and lower lobes. Musculoskeletal: Innumerable predominantly lytic lesions are noted throughout the visualized axial and appendicular skeleton, compatible with widespread metastatic disease to  the bones. There is an associated pathologic compression fracture of superior endplate of T9 with approximately 30% loss of anterior vertebral body height. CT ABDOMEN PELVIS FINDINGS Hepatobiliary: There are several hypovascular hepatic lesions in the liver measuring 3.8 x 2.7 cm in segment 8 (axial image 51 of series 2), 2.8 x 3.4 cm in segment 4B/5 (axial image 71 of series 2) and 1.5 x 1.5 cm in the central aspect of segment 4A (axial image 59 of series 2). No intra or extrahepatic biliary ductal dilatation. Gallbladder is moderately distended, but otherwise unremarkable in appearance. Pancreas: No pancreatic mass. No pancreatic ductal dilatation. No pancreatic or peripancreatic fluid or inflammatory changes. Spleen: Unremarkable. Adrenals/Urinary Tract: Subcentimeter low-attenuation lesions in the lower pole of the right kidney are too small to definitively characterize, but are statistically likely to represent tiny cysts. Left kidney and bilateral adrenal glands are normal in appearance. No hydroureteronephrosis. Urinary bladder is normal in appearance. Stomach/Bowel: The appearance of the stomach is normal. There is no pathologic dilatation of small bowel or colon. Numerous colonic diverticulae are noted, particularly in the sigmoid colon, without surrounding inflammatory changes to suggest an acute diverticulitis at this time. The appendix is not  confidently identified and may be surgically absent. Regardless, there are no inflammatory changes noted adjacent to the cecum to suggest the presence of an acute appendicitis at this time. Vascular/Lymphatic: Aortic atherosclerosis, without evidence of aneurysm or dissection in the abdominal or pelvic vasculature. No lymphadenopathy noted in the abdomen or pelvis. Reproductive: Uterus and ovaries are unremarkable in appearance. Other: No significant volume of ascites. No pneumoperitoneum. Musculoskeletal: Multiple small lytic lesions scattered throughout the  visualized axial and appendicular skeleton, compatible with widespread metastatic disease to the bones. Nondisplaced healing fractures of the right superior and inferior pubic rami. Nondisplaced fractures through both sacral ala. IMPRESSION: 1. 3.3 x 1.8 x 3.3 cm mass in the lateral aspect of the left upper and lower lobes which appears to extend into both the left upper and lower lobes, with associated mediastinal and bilateral hilar lymphadenopathy, as detailed above, highly concerning for primary bronchogenic neoplasm. Further evaluation with PET-CT and/or biopsy is recommended in the near future for diagnostic and staging purposes. 2. 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 3. Heart size is borderline enlarged, and there is evidence of mild interstitial pulmonary edema and small bilateral pleural effusions; imaging findings suggestive of congestive heart failure. 4. Aortic atherosclerosis, in addition to left main and 3 vessel coronary artery disease. Assessment for potential risk factor modification, dietary therapy or pharmacologic therapy may be warranted, if clinically indicated. 5. Colonic diverticulosis without evidence of acute diverticulitis at this time. 6. Additional incidental findings, as above. Aortic Atherosclerosis (ICD10-I70.0). Electronically Signed   By: Vinnie Langton M.D.   On: 04/29/2018 07:31   [place] Ekg Site Rite  Result Date: 04/06/2018 If Site Rite image not attached, placement could not be confirmed due to current cardiac rhythm.   Labs:  CBC: Recent Labs    04/25/18 1402 04/25/18 1810 04/28/18 0549 04/29/18 0533  WBC 6.8 7.0 6.4 4.5  HGB 10.4* 10.2* 10.0* 9.9*  HCT 30.1* 30.8* 31.1* 30.6*  PLT 433.0* 412* 452* 383    COAGS: Recent Labs    04/29/18 0947  INR 0.95    BMP: Recent Labs    04/26/18 0438 04/27/18 0559 04/28/18 0549 04/29/18 0533  NA 125* 124* 126* 125*  K 4.0 3.9 3.8 4.1  CL 93* 92* 92* 89*  CO2 25 25 26 27    GLUCOSE 92 98 106* 142*  BUN 10 11 12 14   CALCIUM 8.7* 8.7* 8.8* 9.2  CREATININE 0.59 0.62 0.64 0.71  GFRNONAA >60 >60 >60 >60  GFRAA >60 >60 >60 >60    LIVER FUNCTION TESTS: Recent Labs    04/01/18 1629 04/03/18 0614 04/10/18 1140 04/19/18 04/25/18 1402  BILITOT 0.4 0.3 0.4  --  0.4  AST 28 29 37 44* 40*  ALT 16 23 31 15 15   ALKPHOS 193* 177* 192* 206* 173*  PROT 6.5 6.1* 7.0  --  6.8  ALBUMIN 3.3* 3.0* 3.9  --  3.8    TUMOR MARKERS: No results for input(s): AFPTM, CEA, CA199, CHROMGRNA in the last 8760 hours.  Assessment and Plan: 77 y.o. female with history of tobacco abuse, COPD, diverticulosis, HTN, hearing loss, hypothyroidism and recent pubic ramus fractures who presented to [name]/7 with persistent hyponatremia, E coli UTI and back pain.  CT scan chest abdomen pelvis performed yesterday revealed:   1. 3.3 x 1.8 x 3.3 cm mass in the lateral aspect of the left upper and lower lobes which appears to extend into both the left upper and lower lobes,  with associated mediastinal and bilateral hilar lymphadenopathy, as detailed above, highly concerning for primary bronchogenic neoplasm. Further evaluation with PET-CT and/or biopsy is recommended in the near future for diagnostic and staging purposes. 2. 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 3. Heart size is borderline enlarged, and there is evidence of mild interstitial pulmonary edema and small bilateral pleural effusions; imaging findings suggestive of congestive heart failure. 4. Aortic atherosclerosis, in addition to left main and 3 vessel coronary artery disease.  5. Colonic diverticulosis without evidence of acute diverticulitis at this time.  MRI thoracic spine performed yesterday revealed :   widespread metastatic disease involving numerous thoracic vertebrae including T1, T2, T5, T6, T7, T9, T11, and T12. Pathologic fracture T9.  Bulky dorsal epidural tumor at T6 results in  significant cord compression. Neurosurgical consultation may be warranted.  BILATERAL pleural effusions, greater on the LEFT with suspected LEFT hilar mass.  Neurosurgical consultation pending.  Request now received for image guided biopsy for further evaluation.  Imaging studies were reviewed by Dr. Anselm Pancoast and plans are now in place for image guided liver lesion biopsy.Risks and benefits discussed with the patient/son including, but not limited to bleeding, infection, damage to adjacent structures or low yield requiring additional tests.  All of the patient's questions were answered, patient is agreeable to proceed. Consent signed and in chart.  Regarding potential need for kyphoplasty we will plan to review latest MRI study with spine radiologist on 11/12 and await NS recs   Thank you for this interesting consult.  I greatly enjoyed meeting [name] [name] and look forward to participating in their care.  A copy of this report was sent to the requesting provider on this date.  Electronically Signed: D. Rowe Robert, PA-C 04/29/2018, 3:02 PM   I spent a total of  30 minutes   in face to face in clinical consultation, greater than 50% of which was counseling/coordinating care for image guided liver lesion biopsy

## 2018-04-30 ENCOUNTER — Telehealth: Payer: Self-pay | Admitting: Family Medicine

## 2018-04-30 ENCOUNTER — Ambulatory Visit: Payer: Medicare Other | Admitting: Internal Medicine

## 2018-04-30 DIAGNOSIS — G9529 Other cord compression: Secondary | ICD-10-CM

## 2018-04-30 DIAGNOSIS — C3412 Malignant neoplasm of upper lobe, left bronchus or lung: Secondary | ICD-10-CM | POA: Insufficient documentation

## 2018-04-30 DIAGNOSIS — C7951 Secondary malignant neoplasm of bone: Secondary | ICD-10-CM | POA: Insufficient documentation

## 2018-04-30 LAB — CBC
HCT: 31.6 % — ABNORMAL LOW (ref 36.0–46.0)
HEMOGLOBIN: 10.4 g/dL — AB (ref 12.0–15.0)
MCH: 28.7 pg (ref 26.0–34.0)
MCHC: 32.9 g/dL (ref 30.0–36.0)
MCV: 87.1 fL (ref 80.0–100.0)
NRBC: 0 % (ref 0.0–0.2)
PLATELETS: 434 10*3/uL — AB (ref 150–400)
RBC: 3.63 MIL/uL — AB (ref 3.87–5.11)
RDW: 14.1 % (ref 11.5–15.5)
WBC: 7.5 10*3/uL (ref 4.0–10.5)

## 2018-04-30 LAB — BASIC METABOLIC PANEL
ANION GAP: 10 (ref 5–15)
BUN: 26 mg/dL — ABNORMAL HIGH (ref 8–23)
CHLORIDE: 89 mmol/L — AB (ref 98–111)
CO2: 26 mmol/L (ref 22–32)
Calcium: 9.2 mg/dL (ref 8.9–10.3)
Creatinine, Ser: 0.88 mg/dL (ref 0.44–1.00)
GFR calc Af Amer: >60 mL/min (ref >60)
Glucose, Bld: 152 mg/dL — ABNORMAL HIGH (ref 70–99)
POTASSIUM: 3.9 mmol/L (ref 3.5–5.1)
SODIUM: 125 mmol/L — AB (ref 135–145)

## 2018-04-30 MED ORDER — DEMECLOCYCLINE HCL 150 MG PO TABS
300.0000 mg | ORAL_TABLET | Freq: Two times a day (BID) | ORAL | Status: DC
  Administered 2018-04-30 – 2018-05-11 (×22): 300 mg via ORAL
  Filled 2018-04-30 (×22): qty 2

## 2018-04-30 MED ORDER — NAPHAZOLINE-PHENIRAMINE 0.025-0.3 % OP SOLN
1.0000 [drp] | Freq: Four times a day (QID) | OPHTHALMIC | Status: DC | PRN
  Administered 2018-04-30 – 2018-05-03 (×2): 1 [drp] via OPHTHALMIC
  Filled 2018-04-30 (×2): qty 15

## 2018-04-30 MED ORDER — HYDROCODONE-ACETAMINOPHEN 5-325 MG PO TABS
1.0000 | ORAL_TABLET | Freq: Four times a day (QID) | ORAL | Status: DC | PRN
  Administered 2018-04-30: 1 via ORAL
  Administered 2018-05-01: 2 via ORAL
  Filled 2018-04-30: qty 2
  Filled 2018-04-30: qty 1

## 2018-04-30 MED ORDER — ENSURE ENLIVE PO LIQD
237.0000 mL | Freq: Two times a day (BID) | ORAL | Status: DC
  Administered 2018-04-30 – 2018-05-10 (×15): 237 mL via ORAL

## 2018-04-30 NOTE — Telephone Encounter (Signed)
Please see message and call son.

## 2018-04-30 NOTE — Progress Notes (Signed)
PT Cancellation Note  Patient Details Name: [name] [name] MRN: [id number] DOB: 08-Feb-1941   Cancelled Treatment:    Reason Eval/Treat Not Completed: Medical issues which prohibited therapy - Neurosurgery still has not seen pt, T6 epidural tumor cord compression still pending. PT to follow up tomorrow.   Julien Girt, PT Acute Rehabilitation Services Pager [phone]  Office [phone]    Roxine Caddy D [name] 04/30/2018, 1:35 PM

## 2018-04-30 NOTE — Progress Notes (Addendum)
PROGRESS NOTE    [name] [name]  EHM:[id number] DOB: 12/23/1940 DOA: 04/25/2018 PCP: Marin Olp, MD      Brief Narrative:  [name] [name] is a 77 y.o. F with COPD well controlled, hypothyroidism, and recent pubic ramus fractures on new opiates who presented with recurrent hyponatremia.  Patient had routine blood work drawn for hospital follow up, this showed sodium back down to 120 mmol/L, asymptomatic, was sent to ER.  Initial treatment focused on treatment of SIADH.  Workup of secondary issue (back pain) leg to incidental discovery of widely metastatic lung cancer.      Assessment & Plan:  Metastatic cancer, suspect lung primary Thoracic compression fracture, pathologic X-ray T spine showed T9 compression fracture.  MRI obtained to further for pre-kyphoplasty showed widespread bony metastasis.    Follow up CT chest/abd/pelvis with contrast showed 3 cm left lung mass, suspicious for primary bronchogenic carcinoma, as well as hepatic metastasis, widespread bony metastasis.   Case has been discussed with Dr. Julien Nordmann, who will arrange outpatient follow up.  -Consult Radiation Oncology, appreciate evaluation by Dr. Tammi Klippel and Freeman Caldron -Biopsy pending -Continue hydrocodone-acetaminophen (patient taking ~1 per day)    T6 epidural metastasis, impending cord compression No symptoms of cauda equina.  Discussed with Neurosurgery, without clinical compression, they recommend following biopsy: -If biopsy shows small cell, no role for surgery -If biopsy shows other than small cell, will need call back to Neurosurgery for evaluation, either radiation then surgery, or possible surgical debulking as inpatient first  -Continue dexamethasone 4 mg TID at d/c  Hyponatremia SIADH.  From lung cancer.  Na no change today.   Discussed with Nephrology, given repeated hospitalizations, reasonable to trial demeclocycline now, follow up with Oncology, true correction of sodium will require  treatment of cancer.  No need for liberalizing fluid restrictions with demec. -Start demeclocycline 300 BID -Continue fluid restrictions  Acute metabolic encephalopathy Intermittent, seems improved today.  Acute diastolic CHF Edema on CXR, had wheezing on exam.   BNP >600.  Echo showed EF 50%, grade I DD.  No significant valve disease, no prior history of CHF.    Diruresed for 2 days, will stop now, as she has no clinical signs of fluid overload nor dyspnea on exertion.  Hypothyroidism -Continue levothyroxine  Recent asymptomatic bacteriuria with ESBL Had ESBL bacteriuria during a previous admission.  Got a PICC and was treated with meropenem.  Recently started on nitrofurantoin for recurrent bacteriuria. Currently without any symptoms of UTI at all.  The patient had an ID referral pending.  This should be deferred for now.  If she were to develop symptoms of UTI or infection, would be reasonable to consult ID, use carbapenem -No antibiotics unless fever  Anxiety -Continue Xanax PRN       MDM and disposition: The below labs and imaging reports were reviewed and summarized above.  Medication management as above.  Management discussed with radiation oncology and Nephrology.  The patient has a severe and life-threatening, likely life ending illness (metastatic lung cancer) with associated encephalopathy/confusion, hyponatremia, and CHF.  PT recommend home health.  At present, her disposition pends her biopsy.  If this is small cell cancer, she should have radiation arranged as an outpatient.  If this is non-small cell, she will need re-involvement of neurosurgery to decide about surgical debulking of her epidural tumor this hospitalization.    Biopsy pending tomorrow.  If [place], possibly home late Weds with HHPT after radiation oncology simulations are done.  If  NSCLC, will need neurosurgery evaluation.          DVT prophylaxis: SCDs Code Status: FULL Family Communication: [name] [name]  at the bedside       Consultants:   IR  Radiation oncology  Procedures:   MR spine  CT chest/abdomen/pelvis  US guided liver biopsy  Antimicrobials:   None    Subjective: Complains of back pain, but is not as confused today.  No urinary symptoms, dyspnea, leg swelling, fever, vomiting, diarrhea.        Objective: Vitals:   04/29/18 1650 04/29/18 2119 04/30/18 0537 04/30/18 1402  BP: 120/78 124/80 101/70 (!) 72/57  Pulse: 68 70 79 80  Resp: 15 17 18 16   Temp:  98.3 F (36.8 C)  97.8 F (36.6 C)  TempSrc:  Oral  Oral  SpO2: 93% 96% 93% 93%  Weight:      Height:        Intake/Output Summary (Last 24 hours) at 04/30/2018 1515 Last data filed at 04/30/2018 1424 Gross per 24 hour  Intake 240 ml  Output 1100 ml  Net -860 ml   Filed Weights   04/25/18 1637 04/29/18 0643  Weight: 67.2 kg 64.3 kg    Examination: General appearance: Elderly female, sleeping, no acute distress  HEENT: Icteric, conjunctival pink, lids and lashes normal, no nasal deformity, discharge, or epistaxis.  Lips moist, dentition normal, oropharynx moist, no oral lesions, hearing diminished. Skin: Warm and dry, no jaundice or suspicious rashes or lesions. Cardiac: Regular rate and rhythm, no murmurs, no lower extremity edema  respiratory: Elder Love effort normal, no rales.  Wheezing on the left only. Abdomen: Abdomen soft without tenderness to palpation, ascites, or distention. MSK: No deformities or effusions of the large joints. Neuro: Extraocular movements intact, moves all extremities with equal strength, sensation intact to the lower extremities, speech fluent.Marland Kitchen    Psych: Attention normal, affect normal, oriented to person, place, and time.  Seems to have some short-term memory loss.  Data Reviewed: I have personally reviewed following labs and imaging studies:  CBC: Recent Labs  Lab 04/25/18 1402 04/25/18 1810 04/28/18 0549 04/29/18 0533 04/30/18 0602  WBC 6.8 7.0 6.4 4.5  7.5  NEUTROABS  --  4.7  --   --   --   HGB 10.4* 10.2* 10.0* 9.9* 10.4*  HCT 30.1* 30.8* 31.1* 30.6* 31.6*  MCV 83.7 86.3 85.7 86.2 87.1  PLT 433.0* 412* 452* 383 347*   Basic Metabolic Panel: Recent Labs  Lab 04/26/18 0438 04/27/18 0559 04/28/18 0549 04/29/18 0533 04/30/18 0602  NA 125* 124* 126* 125* 125*  K 4.0 3.9 3.8 4.1 3.9  CL 93* 92* 92* 89* 89*  CO2 25 25 26 27 26   GLUCOSE 92 98 106* 142* 152*  BUN 10 11 12 14  26*  CREATININE 0.59 0.62 0.64 0.71 0.88  CALCIUM 8.7* 8.7* 8.8* 9.2 9.2   GFR: Estimated Creatinine Clearance: 50.4 mL/min (by C-G formula based on SCr of 0.88 mg/dL). Liver Function Tests: Recent Labs  Lab 04/25/18 1402  AST 40*  ALT 15  ALKPHOS 173*  BILITOT 0.4  PROT 6.8  ALBUMIN 3.8   No results for input(s): LIPASE, AMYLASE in the last 168 hours. No results for input(s): AMMONIA in the last 168 hours. Coagulation Profile: Recent Labs  Lab 04/29/18 0947  INR 0.95   Cardiac Enzymes: No results for input(s): CKTOTAL, CKMB, CKMBINDEX, TROPONINI in the last 168 hours. BNP (last 3 results) No results for input(s): PROBNP in the last  8760 hours. HbA1C: No results for input(s): HGBA1C in the last 72 hours. CBG: No results for input(s): GLUCAP in the last 168 hours. Lipid Profile: No results for input(s): CHOL, HDL, LDLCALC, TRIG, CHOLHDL, LDLDIRECT in the last 72 hours. Thyroid Function Tests: No results for input(s): TSH, T4TOTAL, FREET4, T3FREE, THYROIDAB in the last 72 hours. Anemia Panel: No results for input(s): VITAMINB12, FOLATE, FERRITIN, TIBC, IRON, RETICCTPCT in the last 72 hours. Urine analysis:    Component Value Date/Time   COLORURINE YELLOW 04/25/2018 1922   APPEARANCEUR CLEAR 04/25/2018 1922   LABSPEC 1.010 04/25/2018 1922   PHURINE 7.0 04/25/2018 1922   GLUCOSEU NEGATIVE 04/25/2018 1922   GLUCOSEU NEGATIVE 01/28/2018 1055   HGBUR NEGATIVE 04/25/2018 1922   BILIRUBINUR NEGATIVE 04/25/2018 1922   BILIRUBINUR Negative  04/25/2018 1432   KETONESUR 5 (A) 04/25/2018 1922   PROTEINUR NEGATIVE 04/25/2018 1922   UROBILINOGEN 0.2 04/25/2018 1432   UROBILINOGEN 0.2 01/28/2018 1055   NITRITE NEGATIVE 04/25/2018 1922   LEUKOCYTESUR NEGATIVE 04/25/2018 1922   Sepsis Labs: @LABRCNTIP (procalcitonin:4,lacticacidven:4)  ) Recent Results (from the past 240 hour(s))  Urine Culture     Status: None   Collection Time: 04/25/18  2:02 PM  Result Value Ref Range Status   MICRO NUMBER: [id number]  Final   SPECIMEN QUALITY: ADEQUATE  Final   Sample Source NOT GIVEN  Final   STATUS: FINAL  Final   Result: No Growth  Final  Culture, Urine     Status: Abnormal   Collection Time: 04/25/18  9:35 PM  Result Value Ref Range Status   Specimen Description   Final    URINE, CLEAN CATCH Performed at [facility], [place] [address] [place], [place] [address]    Special Requests   Final    NONE Performed at [facility], [place] [address] [place], [place] [address]    Culture (A)  Final    <10,000 COLONIES/mL INSIGNIFICANT GROWTH Performed at [place] [address] [place],  [address]    Report Status 04/27/2018 FINAL  Final         Radiology Studies: Ct Chest W Contrast  Result Date: 04/29/2018 CLINICAL DATA:  77 year old female with history of metastatic disease to the spine. Unknown primary neoplasm. EXAM: CT CHEST, ABDOMEN, AND PELVIS WITH CONTRAST TECHNIQUE: Multidetector CT imaging of the chest, abdomen and pelvis was performed following the standard protocol during bolus administration of intravenous contrast. CONTRAST:  111mL OMNIPAQUE IOHEXOL 300 MG/ML  SOLN COMPARISON:  CT the abdomen and pelvis 04/01/2018. No prior chest CT. FINDINGS: CT CHEST FINDINGS Cardiovascular: Heart size is borderline enlarged. There is no significant pericardial fluid, thickening or pericardial calcification. There is aortic atherosclerosis, as well as atherosclerosis of the great  vessels of the mediastinum and the coronary arteries, including calcified atherosclerotic plaque in the left main, left anterior descending, left circumflex and right coronary arteries. Mediastinum/Nodes: Multiple prominent borderline enlarged and enlarged mediastinal and bilateral hilar lymph nodes are noted. The largest of these include 2.3 cm low right paratracheal lymph node, 2.1 cm subcarinal lymph node, 1.9 cm AP window lymph node and a 2.2 cm left hilar lymph node. Esophagus is unremarkable in appearance. No axillary lymphadenopathy. Lungs/Pleura: Small bilateral pleural effusions lying dependently with some associated passive subsegmental atelectasis in the lower lobes of the lungs bilaterally. There is a background of interlobular septal thickening, suggesting underlying pulmonary edema. In the lateral aspect of the left major fissure there is a macrolobulated mass  which measures 3.3 x 1.8 x 3.3 cm (axial image 77 of series 6 and sagittal image 136 of series 8) which appears to extend into both the left upper and lower lobes. Musculoskeletal: Innumerable predominantly lytic lesions are noted throughout the visualized axial and appendicular skeleton, compatible with widespread metastatic disease to the bones. There is an associated pathologic compression fracture of superior endplate of T9 with approximately 30% loss of anterior vertebral body height. CT ABDOMEN PELVIS FINDINGS Hepatobiliary: There are several hypovascular hepatic lesions in the liver measuring 3.8 x 2.7 cm in segment 8 (axial image 51 of series 2), 2.8 x 3.4 cm in segment 4B/5 (axial image 71 of series 2) and 1.5 x 1.5 cm in the central aspect of segment 4A (axial image 59 of series 2). No intra or extrahepatic biliary ductal dilatation. Gallbladder is moderately distended, but otherwise unremarkable in appearance. Pancreas: No pancreatic mass. No pancreatic ductal dilatation. No pancreatic or peripancreatic fluid or inflammatory changes.  Spleen: Unremarkable. Adrenals/Urinary Tract: Subcentimeter low-attenuation lesions in the lower pole of the right kidney are too small to definitively characterize, but are statistically likely to represent tiny cysts. Left kidney and bilateral adrenal glands are normal in appearance. No hydroureteronephrosis. Urinary bladder is normal in appearance. Stomach/Bowel: The appearance of the stomach is normal. There is no pathologic dilatation of small bowel or colon. Numerous colonic diverticulae are noted, particularly in the sigmoid colon, without surrounding inflammatory changes to suggest an acute diverticulitis at this time. The appendix is not confidently identified and may be surgically absent. Regardless, there are no inflammatory changes noted adjacent to the cecum to suggest the presence of an acute appendicitis at this time. Vascular/Lymphatic: Aortic atherosclerosis, without evidence of aneurysm or dissection in the abdominal or pelvic vasculature. No lymphadenopathy noted in the abdomen or pelvis. Reproductive: Uterus and ovaries are unremarkable in appearance. Other: No significant volume of ascites. No pneumoperitoneum. Musculoskeletal: Multiple small lytic lesions scattered throughout the visualized axial and appendicular skeleton, compatible with widespread metastatic disease to the bones. Nondisplaced healing fractures of the right superior and inferior pubic rami. Nondisplaced fractures through both sacral ala. IMPRESSION: 1. 3.3 x 1.8 x 3.3 cm mass in the lateral aspect of the left upper and lower lobes which appears to extend into both the left upper and lower lobes, with associated mediastinal and bilateral hilar lymphadenopathy, as detailed above, highly concerning for primary bronchogenic neoplasm. Further evaluation with PET-CT and/or biopsy is recommended in the near future for diagnostic and staging purposes. 2. 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 3. Heart size is  borderline enlarged, and there is evidence of mild interstitial pulmonary edema and small bilateral pleural effusions; imaging findings suggestive of congestive heart failure. 4. Aortic atherosclerosis, in addition to left main and 3 vessel coronary artery disease. Assessment for potential risk factor modification, dietary therapy or pharmacologic therapy may be warranted, if clinically indicated. 5. Colonic diverticulosis without evidence of acute diverticulitis at this time. 6. Additional incidental findings, as above. Aortic Atherosclerosis (ICD10-I70.0). Electronically Signed   By: Vinnie Langton M.D.   On: 04/29/2018 07:31   Ct Abdomen Pelvis W Contrast  Result Date: 04/29/2018 CLINICAL DATA:  77 year old female with history of metastatic disease to the spine. Unknown primary neoplasm. EXAM: CT CHEST, ABDOMEN, AND PELVIS WITH CONTRAST TECHNIQUE: Multidetector CT imaging of the chest, abdomen and pelvis was performed following the standard protocol during bolus administration of intravenous contrast. CONTRAST:  147mL OMNIPAQUE IOHEXOL 300 MG/ML  SOLN  COMPARISON:  CT the abdomen and pelvis 04/01/2018. No prior chest CT. FINDINGS: CT CHEST FINDINGS Cardiovascular: Heart size is borderline enlarged. There is no significant pericardial fluid, thickening or pericardial calcification. There is aortic atherosclerosis, as well as atherosclerosis of the great vessels of the mediastinum and the coronary arteries, including calcified atherosclerotic plaque in the left main, left anterior descending, left circumflex and right coronary arteries. Mediastinum/Nodes: Multiple prominent borderline enlarged and enlarged mediastinal and bilateral hilar lymph nodes are noted. The largest of these include 2.3 cm low right paratracheal lymph node, 2.1 cm subcarinal lymph node, 1.9 cm AP window lymph node and a 2.2 cm left hilar lymph node. Esophagus is unremarkable in appearance. No axillary lymphadenopathy. Lungs/Pleura:  Small bilateral pleural effusions lying dependently with some associated passive subsegmental atelectasis in the lower lobes of the lungs bilaterally. There is a background of interlobular septal thickening, suggesting underlying pulmonary edema. In the lateral aspect of the left major fissure there is a macrolobulated mass which measures 3.3 x 1.8 x 3.3 cm (axial image 77 of series 6 and sagittal image 136 of series 8) which appears to extend into both the left upper and lower lobes. Musculoskeletal: Innumerable predominantly lytic lesions are noted throughout the visualized axial and appendicular skeleton, compatible with widespread metastatic disease to the bones. There is an associated pathologic compression fracture of superior endplate of T9 with approximately 30% loss of anterior vertebral body height. CT ABDOMEN PELVIS FINDINGS Hepatobiliary: There are several hypovascular hepatic lesions in the liver measuring 3.8 x 2.7 cm in segment 8 (axial image 51 of series 2), 2.8 x 3.4 cm in segment 4B/5 (axial image 71 of series 2) and 1.5 x 1.5 cm in the central aspect of segment 4A (axial image 59 of series 2). No intra or extrahepatic biliary ductal dilatation. Gallbladder is moderately distended, but otherwise unremarkable in appearance. Pancreas: No pancreatic mass. No pancreatic ductal dilatation. No pancreatic or peripancreatic fluid or inflammatory changes. Spleen: Unremarkable. Adrenals/Urinary Tract: Subcentimeter low-attenuation lesions in the lower pole of the right kidney are too small to definitively characterize, but are statistically likely to represent tiny cysts. Left kidney and bilateral adrenal glands are normal in appearance. No hydroureteronephrosis. Urinary bladder is normal in appearance. Stomach/Bowel: The appearance of the stomach is normal. There is no pathologic dilatation of small bowel or colon. Numerous colonic diverticulae are noted, particularly in the sigmoid colon, without  surrounding inflammatory changes to suggest an acute diverticulitis at this time. The appendix is not confidently identified and may be surgically absent. Regardless, there are no inflammatory changes noted adjacent to the cecum to suggest the presence of an acute appendicitis at this time. Vascular/Lymphatic: Aortic atherosclerosis, without evidence of aneurysm or dissection in the abdominal or pelvic vasculature. No lymphadenopathy noted in the abdomen or pelvis. Reproductive: Uterus and ovaries are unremarkable in appearance. Other: No significant volume of ascites. No pneumoperitoneum. Musculoskeletal: Multiple small lytic lesions scattered throughout the visualized axial and appendicular skeleton, compatible with widespread metastatic disease to the bones. Nondisplaced healing fractures of the right superior and inferior pubic rami. Nondisplaced fractures through both sacral ala. IMPRESSION: 1. 3.3 x 1.8 x 3.3 cm mass in the lateral aspect of the left upper and lower lobes which appears to extend into both the left upper and lower lobes, with associated mediastinal and bilateral hilar lymphadenopathy, as detailed above, highly concerning for primary bronchogenic neoplasm. Further evaluation with PET-CT and/or biopsy is recommended in the near future for diagnostic and staging purposes.  2. 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 3. Heart size is borderline enlarged, and there is evidence of mild interstitial pulmonary edema and small bilateral pleural effusions; imaging findings suggestive of congestive heart failure. 4. Aortic atherosclerosis, in addition to left main and 3 vessel coronary artery disease. Assessment for potential risk factor modification, dietary therapy or pharmacologic therapy may be warranted, if clinically indicated. 5. Colonic diverticulosis without evidence of acute diverticulitis at this time. 6. Additional incidental findings, as above. Aortic Atherosclerosis  (ICD10-I70.0). Electronically Signed   By: Vinnie Langton M.D.   On: 04/29/2018 07:31   US Biopsy (liver)  Result Date: 04/29/2018 INDICATION: 77 year old with chest lymphadenopathy, bone lesions and liver lesions. Findings are suspicious for metastatic disease and tissue diagnosis is needed. EXAM: ULTRASOUND-GUIDED LIVER LESION BIOPSY MEDICATIONS: None. ANESTHESIA/SEDATION: Moderate (conscious) sedation was employed during this procedure. A total of Versed 1 mg and Fentanyl 50 mcg was administered intravenously. Moderate Sedation Time: 15 minutes. The patient's level of consciousness and vital signs were monitored continuously by radiology nursing throughout the procedure under my direct supervision. FLUOROSCOPY TIME:  None COMPLICATIONS: None immediate. PROCEDURE: Informed written consent was obtained from the patient after a thorough discussion of the procedural risks, benefits and alternatives. All questions were addressed. A timeout was performed prior to the initiation of the procedure. Ultrasound was used to evaluate the liver. A very subtle isoechoic lesion was identified in the segment 4B region adjacent to the gallbladder. This appeared to correspond with the recent CT findings. The right abdomen was prepped and draped in sterile fashion. Maximal barrier sterile technique was utilized including mask, sterile gowns, sterile gloves, sterile drape, hand hygiene and skin antiseptic. Skin was anesthetized with 1% lidocaine. 17 gauge needle directed into the subtle lesion with ultrasound guidance. Four core biopsies obtained with an 18 gauge device. Specimens placed in formalin. Needle was removed without complication. FINDINGS: Very subtle isoechoic lesion adjacent to the gallbladder. This area did correspond with the recent CT findings. Four small core biopsies were obtained. No significant bleeding or hematoma formation after the core biopsies. IMPRESSION: Ultrasound-guided core biopsies of the subtle  hepatic lesion. Visualization of this lesion was very difficult and a false negative biopsy is a possibility. Electronically Signed   By: Markus Daft M.D.   On: 04/29/2018 18:47        Scheduled Meds: . dexamethasone  4 mg Oral Q8H  . feeding supplement (ENSURE ENLIVE)  237 mL Oral BID BM  . levothyroxine  75 mcg Oral QAC breakfast  . sodium chloride flush  10 mL Intravenous Q12H   Continuous Infusions:   LOS: 4 days    Time spent: 25 minutes   Edwin Dada, MD [facility] 04/30/2018, 3:15 PM     Pager [phone] --- please page though AMION:  [email or web address] If 7PM-7AM, please contact night-coverage

## 2018-04-30 NOTE — Telephone Encounter (Signed)
Copied from [place] [id number]. Topic: Quick Communication - See Telephone Encounter >> Apr 30, 2018  1:47 PM Berneta Levins wrote: CRM for notification. See Telephone encounter for: 04/30/18.  Pt's son calling in.  States pt has been diagnosed with cancer of spine at [facility] and he has some questions for provider.  Please call Billy at [phone]

## 2018-04-30 NOTE — Telephone Encounter (Signed)
Spoke with son. He wanted to update me on cancer diagnosis- I told him I was very sorry to hear this information and that I am rooting for his mom. He was appreciative of the support.

## 2018-05-01 DIAGNOSIS — C801 Malignant (primary) neoplasm, unspecified: Secondary | ICD-10-CM

## 2018-05-01 LAB — CBC
HCT: 32 % — ABNORMAL LOW (ref 36.0–46.0)
Hemoglobin: 10.4 g/dL — ABNORMAL LOW (ref 12.0–15.0)
MCH: 28 pg (ref 26.0–34.0)
MCHC: 32.5 g/dL (ref 30.0–36.0)
MCV: 86 fL (ref 80.0–100.0)
NRBC: 0 % (ref 0.0–0.2)
Platelets: 417 10*3/uL — ABNORMAL HIGH (ref 150–400)
RBC: 3.72 MIL/uL — AB (ref 3.87–5.11)
RDW: 14.2 % (ref 11.5–15.5)
WBC: 10.1 10*3/uL (ref 4.0–10.5)

## 2018-05-01 LAB — BASIC METABOLIC PANEL
ANION GAP: 10 (ref 5–15)
BUN: 31 mg/dL — ABNORMAL HIGH (ref 8–23)
CALCIUM: 8.8 mg/dL — AB (ref 8.9–10.3)
CO2: 25 mmol/L (ref 22–32)
Chloride: 94 mmol/L — ABNORMAL LOW (ref 98–111)
Creatinine, Ser: 0.8 mg/dL (ref 0.44–1.00)
GLUCOSE: 180 mg/dL — AB (ref 70–99)
POTASSIUM: 3.8 mmol/L (ref 3.5–5.1)
SODIUM: 129 mmol/L — AB (ref 135–145)

## 2018-05-01 MED ORDER — HYDROCODONE-ACETAMINOPHEN 5-325 MG PO TABS
2.0000 | ORAL_TABLET | ORAL | Status: DC | PRN
  Administered 2018-05-01 – 2018-05-06 (×20): 2 via ORAL
  Filled 2018-05-01 (×21): qty 2

## 2018-05-01 NOTE — Care Management Important Message (Signed)
Important Message  Patient Details  Name: [name] [name] MRN: [id number] Date of Birth: 20-Apr-1941   Medicare Important Message Given:  Yes    Kerin Salen 05/01/2018, 10:37 AMImportant Message  Patient Details  Name: [name] [name] MRN: [id number] Date of Birth: Oct 23, 1940   Medicare Important Message Given:  Yes    Kerin Salen 05/01/2018, 10:37 AM

## 2018-05-01 NOTE — Progress Notes (Signed)
Physical Therapy Treatment Patient Details Name: [name] [name] MRN: [id number] DOB: 01/03/1941 Today's Date: 05/01/2018    History of Present Illness 77 yo female admitted  with hyponatremia secondary to lung CA with widespread mets; Recent hx of R pubic ramus fx, sacral fx, R L5 transverse process fx. Hx of COPD, cigarette smoker, liver mass, hypothyroidism, chronic back pain. MRI=Bulky dorsal epidural tumor at T6 results in significant cord    PT Comments    Pt ambulated to and from bathroom this session, unable to perform further ambulation due to R pelvic and groin pain. Pt and pt's son discussed their concerns about pt's return home due to medical status, pt's husband's current state of health and inability to physically assist pt, and pt's mobility deficits. PT recommending ST-SNF versus HHPT depending on pt's results and mobility at end of week.    Follow Up Recommendations  Supervision/Assistance - 24 hour;Home health PT(vs SNF dependent on biopsy results and medical needs )     Equipment Recommendations  None recommended by PT    Recommendations for Other Services       Precautions / Restrictions Precautions Precautions: Fall Precaution Comments: pubic ramus fracture in October  Restrictions Weight Bearing Restrictions: No Other Position/Activity Restrictions: WBAT    Mobility  Bed Mobility Overal bed mobility: Needs Assistance Bed Mobility: Supine to Sit;Sit to Supine     Supine to sit: Supervision Sit to supine: Supervision;HOB elevated   General bed mobility comments: supervision for safety, increased time   Transfers Overall transfer level: Needs assistance Equipment used: Rolling walker (2 wheeled) Transfers: Sit to/from Stand Sit to Stand: Min guard         General transfer comment: Min guard for safety. Pt with increased time today due to pain.   Ambulation/Gait Ambulation/Gait assistance: Min guard Gait Distance (Feet): 24 Feet(2x12 ft, to  and from bathroom ) Assistive device: Rolling walker (2 wheeled) Gait Pattern/deviations: Step-through pattern;Decreased stride length;Trunk flexed;Antalgic Gait velocity: decr    General Gait Details: min guard for safety. Pt limited by pain this session to ambulating to and from bathroom. Antalgic gait due to R hip and groin pain.    Stairs             Wheelchair Mobility    Modified Rankin (Stroke Patients Only)       Balance Overall balance assessment: Needs assistance;History of Falls Sitting-balance support: No upper extremity supported Sitting balance-Leahy Scale: Good     Standing balance support: During functional activity;No upper extremity supported Standing balance-Leahy Scale: Fair Standing balance comment: able to stand and wash hands at sink without UE support                             Cognition Arousal/Alertness: Awake/alert Behavior During Therapy: WFL for tasks assessed/performed Overall Cognitive Status: Within Functional Limits for tasks assessed                                        Exercises General Exercises - Lower Extremity Ankle Circles/Pumps: AROM;Both;10 reps;Supine Quad Sets: AROM;Both;10 reps;Supine    General Comments General comments (skin integrity, edema, etc.): pt toileted without assistance, increased time and effort to perform.       Pertinent Vitals/Pain Pain Assessment: Faces Faces Pain Scale: Hurts whole lot Pain Location: R pelvis, with mobility and especially ambulation  Pain  Descriptors / Indicators: Discomfort;Sore Pain Intervention(s): Limited activity within patient's tolerance;Monitored during session;Repositioned;Premedicated before session    Home Living                      Prior Function            PT Goals (current goals can now be found in the care plan section) Acute Rehab PT Goals Patient Stated Goal: none stated  PT Goal Formulation: With patient Time For  Goal Achievement: 05/10/18 Potential to Achieve Goals: Good Progress towards PT goals: Progressing toward goals    Frequency    Min 3X/week      PT Plan Current plan remains appropriate    Co-evaluation              AM-PAC PT "6 Clicks" Daily Activity  Outcome Measure  Difficulty turning over in bed (including adjusting bedclothes, sheets and blankets)?: A Little Difficulty moving from lying on back to sitting on the side of the bed? : A Little Difficulty sitting down on and standing up from a chair with arms (e.g., wheelchair, bedside commode, etc,.)?: Unable Help needed moving to and from a bed to chair (including a wheelchair)?: A Little Help needed walking in hospital room?: A Little Help needed climbing 3-5 steps with a railing? : A Little 6 Click Score: 16    End of Session Equipment Utilized During Treatment: Gait belt Activity Tolerance: Patient tolerated treatment well;Patient limited by fatigue Patient left: with call bell/phone within reach;in chair;with chair alarm set Nurse Communication: Mobility status PT Visit Diagnosis: Muscle weakness (generalized) (M62.81);Other abnormalities of gait and mobility (R26.89)     Time: 1610-9604 PT Time Calculation (min) (ACUTE ONLY): 23 min  Charges:  $Gait Training: 8-22 mins $Therapeutic Activity: 8-22 mins                     Julien Girt, PT Acute Rehabilitation Services Pager [phone]  Office [phone]    [name] [name] 05/01/2018, 3:03 PM

## 2018-05-01 NOTE — Progress Notes (Addendum)
PROGRESS NOTE    [name] [name]  YTK:[id number] DOB: [birth year] DOA: 04/25/2018 PCP: Marin Olp, MD   Brief Narrative:   [name] [name] is a 77 y.o. F with COPD, hypothyroidism, and recent pubic ramus fractures on opiates, who presented with recurrent hyponatremia.  Patient had routine blood work drawn for hospital follow up, this showed sodium back down to 120 mmol/L, asymptomatic, was sent to ER. Initial treatment focused on treatment of SIADH.  Workup of secondary issue (back pain) leg to incidental discovery of widely metastatic lung cancer.  Patient is status post biopsy.  Assessment & Plan:  Metastatic cancer likely lung as primary source, along with thoracic compression fracture.   CT scan of the chest abdomen and pelvis showed 3 cm lung mass suspicious for bronchogenic carcinoma with hepatic metastasis and widespread bony metastases.  Radiation oncology has been consulted.  Patient is on pain medication regimen.  Post ultrasound-guided liver biopsy on 04/29/2018.  Biopsy report still pending.  T6 epidural metastasis, impending cord compression with multiple thoracic spine metastasis.  Patient has been seen by neurosurgery.  Awaiting for biopsy report..  If small cell cancer, will not benefit from surgery.  On dexamethasone 4 mg 3 times daily, will need to continue on discharge.  Physical therapy evaluation recommended home health PT/skilled nursing facility.  She has been seen by radiation oncology Dr. Tammi Klippel.  Pelvic pain.  Likely secondary to history of recent suprapubic fractures/metastatic disease.  Hyponatremia Likely from SIADH from lung cancer.  On demeclocycline.  Slightly improved sodium levels.  Continue to monitor.  Acute metabolic encephalopathy intermittent in nature.  Improved today.  Acute diastolic CHF Improved.  2D echocardiogram showed ejection fraction of 50% with grade 1 diastolic dysfunction.  No prior history of congestive heart failure.  Currently  breathing status is stable. off Diuretics.    Hypothyroidism Continue Synthroid.  Recent asymptomatic bacteriuria with ESBL Avoid antibiotics unless patient is symptomatic.  Patient had received Carbapenem in the past.  Anxiety Xanax PRN  Debility, deconditioning.  Physical therapy has recommended home health on discharge.   Disposition:  Telemetry home with home health versus skilled nursing facility. Wait for biopsy report.  If this is small cell cancer then radiation allergy to perform simulation.  If non small cell cancer then consult neurosurgery.   DVT prophylaxis: SCDs  Code Status: FULL  Family Communication: Spoke with 2 sons at bedside and updated them about the clinical condition of the patient.  Consultants:   IR  Radiation oncology  Procedures:   MR spine  CT chest/abdomen/pelvis  US guided liver biopsy 04/29/2018  Antimicrobials:   None   Subjective: Complains of groin pain, back pain.  Complains of dry eyes.  No nausea, vomiting but has poor appetite.  Has mild cough but no shortness of breath.   Objective: Vitals:   04/30/18 1402 04/30/18 2038 05/01/18 0515 05/01/18 0605  BP: (!) 72/57 98/67 127/74   Pulse: 80 82 76   Resp: 16 16 16    Temp: 97.8 F (36.6 C) 97.7 F (36.5 C) 97.9 F (36.6 C)   TempSrc: Oral Oral Oral   SpO2: 93% 95% 99%   Weight:    65.9 kg  Height:        Intake/Output Summary (Last 24 hours) at 05/01/2018 0955 Last data filed at 05/01/2018 0855 Gross per 24 hour  Intake 1080 ml  Output 1601 ml  Net -521 ml   Filed Weights   04/25/18 1637 04/29/18 0643 05/01/18 5573  Weight: 67.2 kg 64.3 kg 65.9 kg   Physical Examination:  General exam: Appears calm and comfortable ,Not in distress HEENT:PERRL,Oral mucosa moist Respiratory system: Bilateral equal air entry, normal vesicular breath sounds, coarse breath sounds noted.  Cardiovascular system: S1 & S2 heard, RRR.  Gastrointestinal system: Abdomen is  nondistended, soft and nontender. No organomegaly or masses felt. Normal bowel sounds heard. Central nervous system: Alert and oriented. No focal neurological deficits. Extremities: No edema, no clubbing ,no cyanosis, distal peripheral pulses palpable.  Tenderness over the right hip joint area and spine on the thoracic region Skin: No rashes, lesions or ulcers,no icterus ,no pallor MSK: Normal muscle bulk,tone ,power   Data Reviewed: I have personally reviewed following labs and imaging studies:  CBC: Recent Labs  Lab 04/25/18 1810 04/28/18 0549 04/29/18 0533 04/30/18 0602 05/01/18 0500  WBC 7.0 6.4 4.5 7.5 10.1  NEUTROABS 4.7  --   --   --   --   HGB 10.2* 10.0* 9.9* 10.4* 10.4*  HCT 30.8* 31.1* 30.6* 31.6* 32.0*  MCV 86.3 85.7 86.2 87.1 86.0  PLT 412* 452* 383 434* 494*   Basic Metabolic Panel: Recent Labs  Lab 04/27/18 0559 04/28/18 0549 04/29/18 0533 04/30/18 0602 05/01/18 0500  NA 124* 126* 125* 125* 129*  K 3.9 3.8 4.1 3.9 3.8  CL 92* 92* 89* 89* 94*  CO2 25 26 27 26 25   GLUCOSE 98 106* 142* 152* 180*  BUN 11 12 14  26* 31*  CREATININE 0.62 0.64 0.71 0.88 0.80  CALCIUM 8.7* 8.8* 9.2 9.2 8.8*   GFR: Estimated Creatinine Clearance: 55.5 mL/min (by C-G formula based on SCr of 0.8 mg/dL). Liver Function Tests: Recent Labs  Lab 04/25/18 1402  AST 40*  ALT 15  ALKPHOS 173*  BILITOT 0.4  PROT 6.8  ALBUMIN 3.8   No results for input(s): LIPASE, AMYLASE in the last 168 hours. No results for input(s): AMMONIA in the last 168 hours. Coagulation Profile: Recent Labs  Lab 04/29/18 0947  INR 0.95   Cardiac Enzymes: No results for input(s): CKTOTAL, CKMB, CKMBINDEX, TROPONINI in the last 168 hours. BNP (last 3 results) No results for input(s): PROBNP in the last 8760 hours. HbA1C: No results for input(s): HGBA1C in the last 72 hours. CBG: No results for input(s): GLUCAP in the last 168 hours. Lipid Profile: No results for input(s): CHOL, HDL, LDLCALC,  TRIG, CHOLHDL, LDLDIRECT in the last 72 hours. Thyroid Function Tests: No results for input(s): TSH, T4TOTAL, FREET4, T3FREE, THYROIDAB in the last 72 hours. Anemia Panel: No results for input(s): VITAMINB12, FOLATE, FERRITIN, TIBC, IRON, RETICCTPCT in the last 72 hours. Urine analysis:    Component Value Date/Time   COLORURINE YELLOW 04/25/2018 1922   APPEARANCEUR CLEAR 04/25/2018 1922   LABSPEC 1.010 04/25/2018 1922   PHURINE 7.0 04/25/2018 1922   GLUCOSEU NEGATIVE 04/25/2018 1922   GLUCOSEU NEGATIVE 01/28/2018 1055   HGBUR NEGATIVE 04/25/2018 1922   BILIRUBINUR NEGATIVE 04/25/2018 1922   BILIRUBINUR Negative 04/25/2018 1432   KETONESUR 5 (A) 04/25/2018 1922   PROTEINUR NEGATIVE 04/25/2018 1922   UROBILINOGEN 0.2 04/25/2018 1432   UROBILINOGEN 0.2 01/28/2018 1055   NITRITE NEGATIVE 04/25/2018 1922   LEUKOCYTESUR NEGATIVE 04/25/2018 1922   Sepsis Labs: @LABRCNTIP (procalcitonin:4,lacticacidven:4)  ) Recent Results (from the past 240 hour(s))  Urine Culture     Status: None   Collection Time: 04/25/18  2:02 PM  Result Value Ref Range Status   MICRO NUMBER: [id number]  Final   SPECIMEN QUALITY: ADEQUATE  Final   Sample Source NOT GIVEN  Final   STATUS: FINAL  Final   Result: No Growth  Final  Culture, Urine     Status: Abnormal   Collection Time: 04/25/18  9:35 PM  Result Value Ref Range Status   Specimen Description   Final    URINE, CLEAN CATCH Performed at [facility], [place] [address], [place], [place] [address]    Special Requests   Final    NONE Performed at [facility], [place] [address] [place], [place] [address]    Culture (A)  Final    <10,000 COLONIES/mL INSIGNIFICANT GROWTH Performed at [facility] [address], [place], [place] [address]    Report Status 04/27/2018 FINAL  Final     Radiology Studies: US Biopsy (liver)  Result Date: 04/29/2018 INDICATION: 77 year old with chest lymphadenopathy, bone  lesions and liver lesions. Findings are suspicious for metastatic disease and tissue diagnosis is needed. EXAM: ULTRASOUND-GUIDED LIVER LESION BIOPSY MEDICATIONS: None. ANESTHESIA/SEDATION: Moderate (conscious) sedation was employed during this procedure. A total of Versed 1 mg and Fentanyl 50 mcg was administered intravenously. Moderate Sedation Time: 15 minutes. The patient's level of consciousness and vital signs were monitored continuously by radiology nursing throughout the procedure under my direct supervision. FLUOROSCOPY TIME:  None COMPLICATIONS: None immediate. PROCEDURE: Informed written consent was obtained from the patient after a thorough discussion of the procedural risks, benefits and alternatives. All questions were addressed. A timeout was performed prior to the initiation of the procedure. Ultrasound was used to evaluate the liver. A very subtle isoechoic lesion was identified in the segment 4B region adjacent to the gallbladder. This appeared to correspond with the recent CT findings. The right abdomen was prepped and draped in sterile fashion. Maximal barrier sterile technique was utilized including mask, sterile gowns, sterile gloves, sterile drape, hand hygiene and skin antiseptic. Skin was anesthetized with 1% lidocaine. 17 gauge needle directed into the subtle lesion with ultrasound guidance. Four core biopsies obtained with an 18 gauge device. Specimens placed in formalin. Needle was removed without complication. FINDINGS: Very subtle isoechoic lesion adjacent to the gallbladder. This area did correspond with the recent CT findings. Four small core biopsies were obtained. No significant bleeding or hematoma formation after the core biopsies. IMPRESSION: Ultrasound-guided core biopsies of the subtle hepatic lesion. Visualization of this lesion was very difficult and a false negative biopsy is a possibility. Electronically Signed   By: Markus Daft M.D.   On: 04/29/2018 18:47    Scheduled  Meds: . demeclocycline  300 mg Oral Q12H  . dexamethasone  4 mg Oral Q8H  . feeding supplement (ENSURE ENLIVE)  237 mL Oral BID BM  . levothyroxine  75 mcg Oral QAC breakfast  . sodium chloride flush  10 mL Intravenous Q12H   Continuous Infusions:   LOS: 5 days    Time spent: 25 minutes. More than 50% of the time was spent in coordination of care.   Flora Lipps, MD [facility] 05/01/2018, 9:55 AM   [email or web address] Password [email or web address] If 7PM-7AM, please contact night-coverage

## 2018-05-02 ENCOUNTER — Ambulatory Visit
Admit: 2018-05-02 | Discharge: 2018-05-02 | Disposition: A | Discharge status: Home / Self Care | Payer: Medicare Other | Attending: Radiation Oncology | Admitting: Radiation Oncology

## 2018-05-02 ENCOUNTER — Inpatient Hospital Stay (HOSPITAL_COMMUNITY): Payer: Medicare Other

## 2018-05-02 DIAGNOSIS — C3412 Malignant neoplasm of upper lobe, left bronchus or lung: Secondary | ICD-10-CM

## 2018-05-02 DIAGNOSIS — M545 Low back pain: Secondary | ICD-10-CM

## 2018-05-02 DIAGNOSIS — G9529 Other cord compression: Principal | ICD-10-CM

## 2018-05-02 DIAGNOSIS — C7951 Secondary malignant neoplasm of bone: Secondary | ICD-10-CM

## 2018-05-02 LAB — CBC
HCT: 32.3 % — ABNORMAL LOW (ref 36.0–46.0)
Hemoglobin: 10.3 g/dL — ABNORMAL LOW (ref 12.0–15.0)
MCH: 28.6 pg (ref 26.0–34.0)
MCHC: 31.9 g/dL (ref 30.0–36.0)
MCV: 89.7 fL (ref 80.0–100.0)
NRBC: 0 % (ref 0.0–0.2)
PLATELETS: 381 10*3/uL (ref 150–400)
RBC: 3.6 MIL/uL — AB (ref 3.87–5.11)
RDW: 14.6 % (ref 11.5–15.5)
WBC: 12.4 10*3/uL — AB (ref 4.0–10.5)

## 2018-05-02 LAB — BASIC METABOLIC PANEL
ANION GAP: 9 (ref 5–15)
BUN: 31 mg/dL — ABNORMAL HIGH (ref 8–23)
CALCIUM: 9.1 mg/dL (ref 8.9–10.3)
CO2: 27 mmol/L (ref 22–32)
Chloride: 97 mmol/L — ABNORMAL LOW (ref 98–111)
Creatinine, Ser: 0.79 mg/dL (ref 0.44–1.00)
Glucose, Bld: 124 mg/dL — ABNORMAL HIGH (ref 70–99)
POTASSIUM: 4.6 mmol/L (ref 3.5–5.1)
Sodium: 133 mmol/L — ABNORMAL LOW (ref 135–145)

## 2018-05-02 MED ORDER — ONDANSETRON HCL 4 MG/2ML IJ SOLN
4.0000 mg | Freq: Four times a day (QID) | INTRAMUSCULAR | Status: DC | PRN
  Administered 2018-05-02 – 2018-05-11 (×4): 4 mg via INTRAVENOUS
  Filled 2018-05-02 (×4): qty 2

## 2018-05-02 MED ORDER — GADOBUTROL 1 MMOL/ML IV SOLN
6.0000 mL | Freq: Once | INTRAVENOUS | Status: AC | PRN
  Administered 2018-05-02: 6 mL via INTRAVENOUS

## 2018-05-02 MED ORDER — LORAZEPAM 2 MG/ML IJ SOLN
1.0000 mg | Freq: Once | INTRAMUSCULAR | Status: AC
  Administered 2018-05-02: 1 mg via INTRAVENOUS
  Filled 2018-05-02: qty 1

## 2018-05-02 NOTE — Progress Notes (Signed)
  Radiation Oncology         ([phone]) [phone] ________________________________  Name: [name] [name] MRN: [id number]  Date: 05/02/2018  DOB: 11/02/1940  INPATIENT  SIMULATION AND TREATMENT PLANNING NOTE    ICD-10-CM   1. 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  Identity was confirmed.  All relevant records and images related to the planned course of therapy were reviewed.  The patient freely provided informed written consent to proceed with treatment after reviewing the details related to the planned course of therapy. The consent form was witnessed and verified by the simulation staff.  Then, the patient was set-up in a stable reproducible  supine position for radiation therapy.  CT images were obtained.  Surface markings were placed.  The CT images were loaded into the planning software.  Then the target and avoidance structures were contoured.  Treatment planning then occurred.  The radiation prescription was entered and confirmed.  Then, I designed and supervised the construction of a total of 2 medically necessary complex treatment devices consisting of MLCs to shield the lungs and heart.  I have requested : 3D Simulation  I have requested a DVH of the following structures: left lung, right lung, heart, esophagus and targets.   PLAN:  The patient will receive 30 Gy in 10 fractions.  ________________________________  Sheral Apley Tammi Klippel, M.D.

## 2018-05-02 NOTE — Progress Notes (Addendum)
I just got off the phone with Dr. Vic Ripper in pathology who confirms that path is consistent with Small Cell Carcinoma, lung primary.  Patient is scheduled for CT SIM this morning and will receive her first radiation treatment this afternoon.  We will plan for a total of 10 palliative radiotherapy treatments to the T6 epidural disease.  She will need an appointment with Medical Oncology for consideration of systemic treatment for her extensive stage Small Cell Carcinoma. I will order an MRI brain for further disease staging and consideration of PCI in the future pending results.  Would recommend continuation of her Decadron 4 mg po TID at discharge once she is deemed medically stable for discharge. We will plan to taper steroids at completion of her radiotherapy.  We will also plan to discuss the potential role of PCI once she has completed   [name] [name], MMS, PA-C [place] [facility] [place]: [phone]  Fax: [phone]

## 2018-05-02 NOTE — Progress Notes (Signed)
PT Cancellation Note  Patient Details Name: [name] [name] MRN: [id number] DOB: 1940/06/21   Cancelled Treatment:    Reason Eval/Treat Not Completed: Patient at procedure or test/unavailable;Fatigue/lethargy limiting ability to participate;Pain limiting ability to participate - Pt preparing to go to radiation early this afternoon. Additionally, pt reported being in a lot of pain and not being able to ambulate with PT at this time. Pt defers PT until tomorrow. PT to follow up tomorrow as schedule permits.   Julien Girt, PT Acute Rehabilitation Services Pager [phone]  Office [phone]    Roxine Caddy D [name] 05/02/2018, 12:56 PM

## 2018-05-02 NOTE — Progress Notes (Signed)
PROGRESS NOTE    [name] [name]  JGG:[id number] DOB: [birth year] DOA: 04/25/2018 PCP: Marin Olp, MD   Brief Narrative:   [name] [name] is a 77 y.o. F with COPD, hypothyroidism, and recent pubic ramus fractures on opiates, who presented with recurrent hyponatremia.  Patient had routine blood work drawn for hospital follow up, this showed sodium back down to 120 mmol/L, asymptomatic, was sent to ER. Initial treatment focused on treatment of SIADH.  Workup of secondary issue (back pain) leg to incidental discovery of widely metastatic lung cancer.  Patient is status post biopsy.  Assessment & Plan:  Metastatic cancer likely lung as primary source, along with thoracic compression fracture.  Pathology likely small cell cancer with lung primary as per radiation oncology notes. CT scan of the chest abdomen and pelvis showed 3 cm lung mass suspicious for bronchogenic carcinoma with hepatic metastasis and widespread bony metastases.  Radiation oncology on board.  Patient is on pain medication regimen.  Post ultrasound-guided liver biopsy on 04/29/2018.   T6 epidural metastasis, impending cord compression with multiple thoracic spine metastasis.  Continue radiation.  On dexamethasone 4 mg 3 times daily, will need to continue on discharge.  Physical therapy evaluation recommended home health PT/skilled nursing facility.  I spoke with the patient's son at bedside regarding this.  He is unsure whether the patient will be able to manage herself at home.  Pelvic pain.  Likely secondary to history of recent suprapubic fractures/metastatic disease.  Hyponatremia Likely from SIADH from lung cancer.  On demeclocycline.  Improved sodium levels.  Increase fluid restriction to 1500 mils a day.  Acute metabolic encephalopathy Improved.  Acute diastolic CHF Improved.  2D echocardiogram showed ejection fraction of 50% with grade 1 diastolic dysfunction.  No prior history of congestive heart failure.   Currently breathing status is stable. off Diuretics.    Hypothyroidism Continue Synthroid.  Recent asymptomatic bacteriuria with ESBL Avoid antibiotics unless patient is symptomatic.  Patient had received Carbapenem in the past.  Anxiety Xanax PRN  Debility, deconditioning.  Physical therapy has recommended home health/SNF on discharge.    Disposition:  Patient still complains of pelvic pain.  Seen by radiation oncology this morning and is scheduled for simulation and possible radiotherapy today.  Physical therapy has recommended home health/skilled nursing facility.  Patient is concerned about going home including the family.  Social workers [place].  DVT prophylaxis: SCDs  Code Status: FULL  Family Communication: Spoke with the patient son at bedside and updated them about the clinical condition of the patient.  Consultants:   IR  Radiation oncology  Procedures:   MR spine  CT chest/abdomen/pelvis  US guided liver biopsy 04/29/2018  Antimicrobials:   None   Subjective:  Patient complains of back pain and difficulty walking.  Waiting for radiation treatment today.  Any cough shortness of breath chest pain.   Objective: Vitals:   05/01/18 1538 05/01/18 2227 05/02/18 0415 05/02/18 1310  BP: 117/72 127/81 124/76 128/76  Pulse: 75 75 78 77  Resp: 18 16 18 18   Temp: (!) 97.5 F (36.4 C) 97.6 F (36.4 C) 97.9 F (36.6 C) 98 F (36.7 C)  TempSrc: Oral Oral Oral Oral  SpO2: 96% 96% 98% 98%  Weight:      Height:        Intake/Output Summary (Last 24 hours) at 05/02/2018 1327 Last data filed at 05/02/2018 1033 Gross per 24 hour  Intake 960 ml  Output 750 ml  Net 210 ml  Filed Weights   04/25/18 1637 04/29/18 0643 05/01/18 0605  Weight: 67.2 kg 64.3 kg 65.9 kg   Physical Examination:  General exam: Appears calm and comfortable ,Not in distress HEENT:PERRL,Oral mucosa moist Respiratory system: Bilateral equal air entry, normal vesicular breath  sounds, coarse breath sounds noted.  Cardiovascular system: S1 & S2 heard, RRR.  Gastrointestinal system: Abdomen is nondistended, soft and nontender. No organomegaly or masses felt. Normal bowel sounds heard. Central nervous system: Alert and oriented. No focal neurological deficits. Extremities: No edema, no clubbing ,no cyanosis, distal peripheral pulses palpable.  Tenderness over the right hip joint area and spine on the thoracic region Skin: No rashes, lesions or ulcers,no icterus ,no pallor MSK: Normal muscle bulk,tone ,power   Data Reviewed: I have personally reviewed following labs and imaging studies:  CBC: Recent Labs  Lab 04/25/18 1810 04/28/18 0549 04/29/18 0533 04/30/18 0602 05/01/18 0500 05/02/18 0500  WBC 7.0 6.4 4.5 7.5 10.1 12.4*  NEUTROABS 4.7  --   --   --   --   --   HGB 10.2* 10.0* 9.9* 10.4* 10.4* 10.3*  HCT 30.8* 31.1* 30.6* 31.6* 32.0* 32.3*  MCV 86.3 85.7 86.2 87.1 86.0 89.7  PLT 412* 452* 383 434* 417* 962   Basic Metabolic Panel: Recent Labs  Lab 04/28/18 0549 04/29/18 0533 04/30/18 0602 05/01/18 0500 05/02/18 0500  NA 126* 125* 125* 129* 133*  K 3.8 4.1 3.9 3.8 4.6  CL 92* 89* 89* 94* 97*  CO2 26 27 26 25 27   GLUCOSE 106* 142* 152* 180* 124*  BUN 12 14 26* 31* 31*  CREATININE 0.64 0.71 0.88 0.80 0.79  CALCIUM 8.8* 9.2 9.2 8.8* 9.1   GFR: Estimated Creatinine Clearance: 55.5 mL/min (by C-G formula based on SCr of 0.79 mg/dL). Liver Function Tests: Recent Labs  Lab 04/25/18 1402  AST 40*  ALT 15  ALKPHOS 173*  BILITOT 0.4  PROT 6.8  ALBUMIN 3.8   No results for input(s): LIPASE, AMYLASE in the last 168 hours. No results for input(s): AMMONIA in the last 168 hours. Coagulation Profile: Recent Labs  Lab 04/29/18 0947  INR 0.95   Cardiac Enzymes: No results for input(s): CKTOTAL, CKMB, CKMBINDEX, TROPONINI in the last 168 hours. BNP (last 3 results) No results for input(s): PROBNP in the last 8760 hours. HbA1C: No results for  input(s): HGBA1C in the last 72 hours. CBG: No results for input(s): GLUCAP in the last 168 hours. Lipid Profile: No results for input(s): CHOL, HDL, LDLCALC, TRIG, CHOLHDL, LDLDIRECT in the last 72 hours. Thyroid Function Tests: No results for input(s): TSH, T4TOTAL, FREET4, T3FREE, THYROIDAB in the last 72 hours. Anemia Panel: No results for input(s): VITAMINB12, FOLATE, FERRITIN, TIBC, IRON, RETICCTPCT in the last 72 hours. Urine analysis:    Component Value Date/Time   COLORURINE YELLOW 04/25/2018 1922   APPEARANCEUR CLEAR 04/25/2018 1922   LABSPEC 1.010 04/25/2018 1922   PHURINE 7.0 04/25/2018 1922   GLUCOSEU NEGATIVE 04/25/2018 1922   GLUCOSEU NEGATIVE 01/28/2018 1055   HGBUR NEGATIVE 04/25/2018 1922   BILIRUBINUR NEGATIVE 04/25/2018 1922   BILIRUBINUR Negative 04/25/2018 1432   KETONESUR 5 (A) 04/25/2018 1922   PROTEINUR NEGATIVE 04/25/2018 1922   UROBILINOGEN 0.2 04/25/2018 1432   UROBILINOGEN 0.2 01/28/2018 1055   NITRITE NEGATIVE 04/25/2018 1922   LEUKOCYTESUR NEGATIVE 04/25/2018 1922   Sepsis Labs: @LABRCNTIP (procalcitonin:4,lacticacidven:4)  ) Recent Results (from the past 240 hour(s))  Urine Culture     Status: None   Collection Time: 04/25/18  2:02 PM  Result Value Ref Range Status   MICRO NUMBER: [id number]  Final   SPECIMEN QUALITY: ADEQUATE  Final   Sample Source NOT GIVEN  Final   STATUS: FINAL  Final   Result: No Growth  Final  Culture, Urine     Status: Abnormal   Collection Time: 04/25/18  9:35 PM  Result Value Ref Range Status   Specimen Description   Final    URINE, CLEAN CATCH Performed at [facility], [place] [address], [place], [place] [address]    Special Requests   Final    NONE Performed at [facility], [place] [address] [place], [place] [address]    Culture (A)  Final    <10,000 COLONIES/mL INSIGNIFICANT GROWTH Performed at [facility] [address] [place], [place] [address]    Report  Status 04/27/2018 FINAL  Final     Radiology Studies: No results found.  Scheduled Meds: . demeclocycline  300 mg Oral Q12H  . dexamethasone  4 mg Oral Q8H  . feeding supplement (ENSURE ENLIVE)  237 mL Oral BID BM  . levothyroxine  75 mcg Oral QAC breakfast  . sodium chloride flush  10 mL Intravenous Q12H   Continuous Infusions:   LOS: 6 days    Time spent: 25 minutes. More than 50% of the time was spent in coordination of care.   Flora Lipps, MD [facility] 05/02/2018, 1:27 PM   [email or web address] Password [email or web address] If 7PM-7AM, please contact night-coverage

## 2018-05-03 ENCOUNTER — Ambulatory Visit
Admit: 2018-05-03 | Discharge: 2018-05-03 | Disposition: A | Discharge status: Home / Self Care | Payer: Medicare Other | Attending: Radiation Oncology | Admitting: Radiation Oncology

## 2018-05-03 LAB — CBC
HEMATOCRIT: 33.8 % — AB (ref 36.0–46.0)
HEMOGLOBIN: 10.9 g/dL — AB (ref 12.0–15.0)
MCH: 28.8 pg (ref 26.0–34.0)
MCHC: 32.2 g/dL (ref 30.0–36.0)
MCV: 89.2 fL (ref 80.0–100.0)
Platelets: 391 10*3/uL (ref 150–400)
RBC: 3.79 MIL/uL — ABNORMAL LOW (ref 3.87–5.11)
RDW: 14.6 % (ref 11.5–15.5)
WBC: 13.5 10*3/uL — ABNORMAL HIGH (ref 4.0–10.5)
nRBC: 0 % (ref 0.0–0.2)

## 2018-05-03 LAB — BASIC METABOLIC PANEL
ANION GAP: 8 (ref 5–15)
BUN: 25 mg/dL — ABNORMAL HIGH (ref 8–23)
CALCIUM: 9.1 mg/dL (ref 8.9–10.3)
CHLORIDE: 93 mmol/L — AB (ref 98–111)
CO2: 27 mmol/L (ref 22–32)
Creatinine, Ser: 0.72 mg/dL (ref 0.44–1.00)
GFR calc Af Amer: >60 mL/min (ref >60)
GFR calc non Af Amer: >60 mL/min (ref >60)
GLUCOSE: 105 mg/dL — AB (ref 70–99)
Potassium: 4.7 mmol/L (ref 3.5–5.1)
Sodium: 128 mmol/L — ABNORMAL LOW (ref 135–145)

## 2018-05-03 NOTE — Progress Notes (Signed)
Physical Therapy Treatment Patient Details Name: [name] [name] MRN: [id number] DOB: 1941-05-30 Today's Date: 05/03/2018    History of Present Illness 77 yo female admitted  with hyponatremia secondary to lung CA with widespread mets; Recent hx of R pubic ramus fx, sacral fx, R L5 transverse process fx. Hx of COPD, cigarette smoker, liver mass, hypothyroidism, chronic back pain. MRI=Bulky dorsal epidural tumor at T6 results in significant cord    PT Comments    Pt presents with continued back pain, and requires increased assist for all mobility this session due to pain and weakness. Pt very limited in tolerance to ambulation at this time, and pt's unsteadiness with ambulation is apparent this session. Pt with regression in mobility since time of evaluation, PT updated pt's d/c plan to recommend SNF level of care after acute setting due to pt's deficits. Pt unsafe to d/c home at this time from a mobility standpoint. Will continue to follow acutely.     Follow Up Recommendations  Supervision/Assistance - 24 hour;SNF(vs SNF dependent on biopsy results and medical needs )     Equipment Recommendations  None recommended by PT    Recommendations for Other Services       Precautions / Restrictions Precautions Precautions: Fall Precaution Comments: pubic ramus fracture in October  Restrictions Weight Bearing Restrictions: No Other Position/Activity Restrictions: WBAT    Mobility  Bed Mobility Overal bed mobility: Needs Assistance Bed Mobility: Sidelying to Sit;Rolling;Sit to Sidelying Rolling: Min assist Sidelying to sit: Min assist;HOB elevated     Sit to sidelying: Min assist;HOB elevated General bed mobility comments: Rolling assist for pelvic and shoulder girdle facilitation. Pt with increased pain with rolling, but pt educated on log roll technique and benefit of protecting back. Min assist for sidelying<>sit for LE management, trunk elevation. PT positioned pt in R sidelying  to relieve pressure on back, used pillows and blankets to put pt in position of comfort.   Transfers Overall transfer level: Needs assistance Equipment used: Rolling walker (2 wheeled);None Transfers: Risk manager;Sit to/from Stand Sit to Stand: Min assist Stand pivot transfers: Mod assist       General transfer comment: Mod assist for stand pivot without RW (pt used bed rails and BSC armrests for steadying) for steadying, guiding to bedside commode. Pt very unsteady and crying out in pain during transfer. Pt min assist for sit to stand with RW for power up and steadying.   Ambulation/Gait Ambulation/Gait assistance: Min assist Gait Distance (Feet): 20 Feet Assistive device: Rolling walker (2 wheeled) Gait Pattern/deviations: Step-through pattern;Decreased stride length;Shuffle;Drifts right/left Gait velocity: decr    General Gait Details: Min assist for steadying and guiding rolling walker. Pt with variable step length and width, pt would lift foot off ground and propel her LE forward from hip which created variable steps. Pt quite unsteady, requiring very close guard and assist.   Stairs             Wheelchair Mobility    Modified Rankin (Stroke Patients Only)       Balance Overall balance assessment: Needs assistance;History of Falls Sitting-balance support: No upper extremity supported Sitting balance-Leahy Scale: Fair Sitting balance - Comments: difficulty with sitting balance with introduction of LE movements, requires steadying    Standing balance support: During functional activity;Bilateral upper extremity supported Standing balance-Leahy Scale: Poor Standing balance comment: relies heavily on RW and PT steadying  Cognition Arousal/Alertness: Awake/alert Behavior During Therapy: WFL for tasks assessed/performed Overall Cognitive Status: Within Functional Limits for tasks assessed                                         Exercises General Exercises - Lower Extremity Long Arc Quad: AROM;Both;10 reps;Seated Hip Flexion/Marching: AROM;Both;20 reps;Seated    General Comments        Pertinent Vitals/Pain Pain Assessment: Faces Faces Pain Scale: Hurts whole lot Pain Location: back and R anterior pelvis Pain Descriptors / Indicators: Discomfort;Sore Pain Intervention(s): Limited activity within patient's tolerance;Repositioned;Monitored during session;Premedicated before session    Home Living                      Prior Function            PT Goals (current goals can now be found in the care plan section) Acute Rehab PT Goals Patient Stated Goal: none stated  PT Goal Formulation: With patient Time For Goal Achievement: 05/10/18 Potential to Achieve Goals: Good Progress towards PT goals: Progressing toward goals    Frequency    Min 2X/week      PT Plan Discharge plan needs to be updated;Frequency needs to be updated    Co-evaluation              AM-PAC PT "6 Clicks" Daily Activity  Outcome Measure  Difficulty turning over in bed (including adjusting bedclothes, sheets and blankets)?: Unable Difficulty moving from lying on back to sitting on the side of the bed? : Unable Difficulty sitting down on and standing up from a chair with arms (e.g., wheelchair, bedside commode, etc,.)?: Unable Help needed moving to and from a bed to chair (including a wheelchair)?: A Little Help needed walking in hospital room?: A Little Help needed climbing 3-5 steps with a railing? : A Lot 6 Click Score: 11    End of Session Equipment Utilized During Treatment: Gait belt Activity Tolerance: Patient tolerated treatment well;Patient limited by fatigue Patient left: with call bell/phone within reach;in chair;with chair alarm set Nurse Communication: Mobility status PT Visit Diagnosis: Muscle weakness (generalized) (M62.81);Other abnormalities of gait and mobility  (R26.89)     Time: 9470-9628 PT Time Calculation (min) (ACUTE ONLY): 23 min  Charges:  $Gait Training: 8-22 mins $Therapeutic Activity: 8-22 mins                     Julien Girt, PT Acute Rehabilitation Services Pager [phone]  Office [phone]   Thresa Dozier D Raekwan Spelman 05/03/2018, 1:19 PM

## 2018-05-03 NOTE — Progress Notes (Signed)
PROGRESS NOTE    [name] [name]  JJH:[id number] DOB: 27-Dec-1940 DOA: 04/25/2018 PCP: Marin Olp, MD   Brief Narrative:   [name] [name] is a 77 y.o. F with COPD, hypothyroidism, and recent pubic ramus fractures on opiates,  presented to the hospital with recurrent hyponatremia.  Patient had routine blood work drawn which showed sodium back down to 120 mmol/L, was asymptomatic but was sent to ER. Initial treatment focused on treatment of SIADH.  Workup of secondary issue (back pain) leg to incidental discovery of widely metastatic lung cancer.  Patient is status post biopsy.    Assessment & Plan:  Metastatic cancer likely lung as primary source, along with thoracic compression fracture.  Pathology shows a metastatic small cell cancer with lung primary. CT scan of the chest abdomen and pelvis showed 3 cm lung mass suspicious for bronchogenic carcinoma with hepatic metastasis and widespread bony metastases.  Radiation oncology on board. Post ultrasound-guided liver biopsy on 04/29/2018.   T6 epidural metastasis, impending cord compression with multiple thoracic spine metastasis.  Continue radiation.  On dexamethasone 4 mg 3 times daily, will need to continue on discharge.  Physical therapy evaluation recommended home health PT/skilled nursing facility.  Therapy to reevaluate the patient today to discern the level of care.    Pelvic pain.  Likely secondary to history of recent suprapubic fractures/metastatic disease.  Improved  Hyponatremia Likely from SIADH from lung cancer.  On demeclocycline.  Trending down sodium levels again likely secondary to lack of adequate fluid restriction.  Fluid restriction was emphasized to the patient.  Acute metabolic encephalopathy Improved.  Acute diastolic CHF Improved.  2D echocardiogram showed ejection fraction of 50% with grade 1 diastolic dysfunction.  No prior history of congestive heart failure.  Currently breathing status is stable. off Diuretics.     Hypothyroidism Continue Synthroid.  Recent asymptomatic bacteriuria with ESBL Avoid antibiotics unless patient is symptomatic.  Patient had received Carbapenem in the past.  Anxiety Xanax PRN  Debility, deconditioning.  Physical therapy has recommended skilled nursing facility placement.  Disposition: Nursing facility placement.  Continue radiation treatment.  DVT prophylaxis: SCDs  Code Status: FULL  Family Communication: No one available at bedside.  Consultants:   IR  Radiation oncology  Procedures:   MR spine  CT chest/abdomen/pelvis  US guided liver biopsy 04/29/2018  Antimicrobials:   None   Subjective:  Complains of severe pain in the back.  She was seen by physical therapy who recommended skilled nursing facility placement due to limited ambulation and pain.  Is extremely concerned about fluid restriction   Objective: Vitals:   05/02/18 1310 05/02/18 2133 05/03/18 0500 05/03/18 0613  BP: 128/76 (!) 144/91  (!) 158/88  Pulse: 77 77  84  Resp: 18 15  17   Temp: 98 F (36.7 C) 97.8 F (36.6 C)  98.1 F (36.7 C)  TempSrc: Oral Oral  Oral  SpO2: 98% 96%  96%  Weight:   67.4 kg   Height:        Intake/Output Summary (Last 24 hours) at 05/03/2018 1228 Last data filed at 05/03/2018 0900 Gross per 24 hour  Intake 730 ml  Output 2100 ml  Net -1370 ml   Filed Weights   04/29/18 0643 05/01/18 0605 05/03/18 0500  Weight: 64.3 kg 65.9 kg 67.4 kg    Physical Examination: General exam: Appears calm and comfortable ,Not in distress HEENT:PERRL,Oral mucosa moist Respiratory system: Bilateral equal air entry, normal vesicular breath sounds, no wheezes or crackles  Cardiovascular system: S1 & S2 heard, RRR.  Gastrointestinal system: Abdomen is nondistended, soft and nontender. No organomegaly or masses felt. Normal bowel sounds heard. Central nervous system: Alert and oriented. No focal neurological deficits. Extremities: No edema, no clubbing ,no  cyanosis, distal peripheral pulses palpable.  Tenderness over the right hip joint area and spine on the thoracic region. Skin: No rashes, lesions or ulcers,no icterus ,no pallor MSK: Normal muscle bulk,tone ,power  Data Reviewed: I have personally reviewed following labs and imaging studies:  CBC: Recent Labs  Lab 04/29/18 0533 04/30/18 0602 05/01/18 0500 05/02/18 0500 05/03/18 0534  WBC 4.5 7.5 10.1 12.4* 13.5*  HGB 9.9* 10.4* 10.4* 10.3* 10.9*  HCT 30.6* 31.6* 32.0* 32.3* 33.8*  MCV 86.2 87.1 86.0 89.7 89.2  PLT 383 434* 417* 381 026   Basic Metabolic Panel: Recent Labs  Lab 04/29/18 0533 04/30/18 0602 05/01/18 0500 05/02/18 0500 05/03/18 0534  NA 125* 125* 129* 133* 128*  K 4.1 3.9 3.8 4.6 4.7  CL 89* 89* 94* 97* 93*  CO2 27 26 25 27 27   GLUCOSE 142* 152* 180* 124* 105*  BUN 14 26* 31* 31* 25*  CREATININE 0.71 0.88 0.80 0.79 0.72  CALCIUM 9.2 9.2 8.8* 9.1 9.1   GFR: Estimated Creatinine Clearance: 55.5 mL/min (by C-G formula based on SCr of 0.72 mg/dL). Liver Function Tests: No results for input(s): AST, ALT, ALKPHOS, BILITOT, PROT, ALBUMIN in the last 168 hours. No results for input(s): LIPASE, AMYLASE in the last 168 hours. No results for input(s): AMMONIA in the last 168 hours. Coagulation Profile: Recent Labs  Lab 04/29/18 0947  INR 0.95   Cardiac Enzymes: No results for input(s): CKTOTAL, CKMB, CKMBINDEX, TROPONINI in the last 168 hours. BNP (last 3 results) No results for input(s): PROBNP in the last 8760 hours. HbA1C: No results for input(s): HGBA1C in the last 72 hours. CBG: No results for input(s): GLUCAP in the last 168 hours. Lipid Profile: No results for input(s): CHOL, HDL, LDLCALC, TRIG, CHOLHDL, LDLDIRECT in the last 72 hours. Thyroid Function Tests: No results for input(s): TSH, T4TOTAL, FREET4, T3FREE, THYROIDAB in the last 72 hours. Anemia Panel: No results for input(s): VITAMINB12, FOLATE, FERRITIN, TIBC, IRON, RETICCTPCT in the last  72 hours. Urine analysis:    Component Value Date/Time   COLORURINE YELLOW 04/25/2018 1922   APPEARANCEUR CLEAR 04/25/2018 1922   LABSPEC 1.010 04/25/2018 1922   PHURINE 7.0 04/25/2018 1922   GLUCOSEU NEGATIVE 04/25/2018 1922   GLUCOSEU NEGATIVE 01/28/2018 1055   HGBUR NEGATIVE 04/25/2018 1922   BILIRUBINUR NEGATIVE 04/25/2018 1922   BILIRUBINUR Negative 04/25/2018 1432   KETONESUR 5 (A) 04/25/2018 1922   PROTEINUR NEGATIVE 04/25/2018 1922   UROBILINOGEN 0.2 04/25/2018 1432   UROBILINOGEN 0.2 01/28/2018 1055   NITRITE NEGATIVE 04/25/2018 1922   LEUKOCYTESUR NEGATIVE 04/25/2018 1922   Sepsis Labs: @LABRCNTIP (procalcitonin:4,lacticacidven:4)  ) Recent Results (from the past 240 hour(s))  Urine Culture     Status: None   Collection Time: 04/25/18  2:02 PM  Result Value Ref Range Status   MICRO NUMBER: [id number]  Final   SPECIMEN QUALITY: ADEQUATE  Final   Sample Source NOT GIVEN  Final   STATUS: FINAL  Final   Result: No Growth  Final  Culture, Urine     Status: Abnormal   Collection Time: 04/25/18  9:35 PM  Result Value Ref Range Status   Specimen Description   Final    URINE, CLEAN CATCH Performed at [facility], [place]  [address] [place], [place] [address]    Special Requests   Final    NONE Performed at [facility], [place] [address] [place], [place] [address]    Culture (A)  Final    <10,000 COLONIES/mL INSIGNIFICANT GROWTH Performed at [facility] [address] [place], [place] [address]    Report Status 04/27/2018 FINAL  Final     Radiology Studies: Mr Jeri Cos JH Contrast  Result Date: 05/02/2018 CLINICAL DATA:  Initial evaluation for small cell lung cancer, staging. EXAM: MRI HEAD WITHOUT AND WITH CONTRAST TECHNIQUE: Multiplanar, multiecho pulse sequences of the brain and surrounding structures were obtained without and with intravenous contrast. CONTRAST:  60 cc of Gadavist. COMPARISON:  Prior CT from 07/07/2016  FINDINGS: Brain: Moderately advanced cerebral atrophy with chronic small vessel ischemic disease. Superimposed remote left thalamic lacunar infarct noted. No abnormal foci of restricted diffusion to suggest acute or subacute ischemia. Gray-white matter differentiation maintained. No encephalomalacia to suggest chronic cortical infarction. No foci of susceptibility artifact to suggest acute or chronic intracranial hemorrhage. No mass lesion, midline shift or mass effect. Ventricles normal size without hydrocephalus. No extra-axial fluid collection. No abnormal enhancement. Apparent punctate focus of enhancement within the subcortical white matter of the left frontal lobe seen on coronal post gadolinium sequence image 15 favored to be artifactual or possibly vascular in nature. No other evidence for intracranial metastatic disease. Pituitary gland grossly within normal limits. Vascular: Major intracranial vascular flow voids maintained. Skull and upper cervical spine: Craniocervical junction normal. Multilevel cervical spondylolysis noted within the upper cervical spine without high-grade stenosis. There is abnormal T1 hypointense signal intensity seen involving the right lateral mass of C1 (series 4, image 8), indeterminate, but concerning for possible osseous metastasis. No other focal marrow replacing lesion identified. Sinuses/Orbits: Globes and orbital soft tissues within normal limits. Right maxillary sinus retention cyst. Paranasal sinuses are otherwise clear. No significant mastoid effusion. Inner ear structures grossly normal. Other: None. IMPRESSION: 1. Abnormal signal intensity involving the right lateral mass of C1, concerning for possible osseous metastasis. 2. No other MRI evidence for intracranial metastatic disease within the brain. 3. No other acute intracranial abnormality. 4. Moderately advanced cerebral atrophy with chronic small vessel ischemic disease. Electronically Signed   By: Jeannine Boga M.D.   On: 05/02/2018 21:39    Scheduled Meds: . demeclocycline  300 mg Oral Q12H  . dexamethasone  4 mg Oral Q8H  . feeding supplement (ENSURE ENLIVE)  237 mL Oral BID BM  . levothyroxine  75 mcg Oral QAC breakfast  . sodium chloride flush  10 mL Intravenous Q12H   Continuous Infusions:   LOS: 7 days    Time spent: 25 minutes. More than 50% of the time was spent in coordination of care.   Flora Lipps, MD [facility] 05/03/2018, 12:28 PM   [email or web address] Password [email or web address] If 7PM-7AM, please contact night-coverage

## 2018-05-04 LAB — BASIC METABOLIC PANEL
Anion gap: 8 (ref 5–15)
BUN: 33 mg/dL — ABNORMAL HIGH (ref 8–23)
CHLORIDE: 96 mmol/L — AB (ref 98–111)
CO2: 24 mmol/L (ref 22–32)
CREATININE: 0.68 mg/dL (ref 0.44–1.00)
Calcium: 8.9 mg/dL (ref 8.9–10.3)
GFR calc non Af Amer: >60 mL/min (ref >60)
Glucose, Bld: 122 mg/dL — ABNORMAL HIGH (ref 70–99)
POTASSIUM: 4.7 mmol/L (ref 3.5–5.1)
Sodium: 128 mmol/L — ABNORMAL LOW (ref 135–145)

## 2018-05-04 LAB — CBC
HEMATOCRIT: 32.4 % — AB (ref 36.0–46.0)
HEMOGLOBIN: 10.4 g/dL — AB (ref 12.0–15.0)
MCH: 27.8 pg (ref 26.0–34.0)
MCHC: 32.1 g/dL (ref 30.0–36.0)
MCV: 86.6 fL (ref 80.0–100.0)
NRBC: 0 % (ref 0.0–0.2)
Platelets: 414 10*3/uL — ABNORMAL HIGH (ref 150–400)
RBC: 3.74 MIL/uL — ABNORMAL LOW (ref 3.87–5.11)
RDW: 14.6 % (ref 11.5–15.5)
WBC: 10.4 10*3/uL (ref 4.0–10.5)

## 2018-05-04 MED ORDER — POLYETHYLENE GLYCOL 3350 17 G PO PACK
17.0000 g | PACK | Freq: Every day | ORAL | Status: DC
  Administered 2018-05-04 – 2018-05-11 (×7): 17 g via ORAL
  Filled 2018-05-04 (×7): qty 1

## 2018-05-04 MED ORDER — SODIUM CHLORIDE 1 G PO TABS
1.0000 g | ORAL_TABLET | Freq: Three times a day (TID) | ORAL | Status: DC
  Administered 2018-05-04 – 2018-05-11 (×20): 1 g via ORAL
  Filled 2018-05-04 (×23): qty 1

## 2018-05-04 NOTE — Progress Notes (Signed)
PROGRESS NOTE    [name] [name]  OIB:[id number] DOB: Apr 01, 1941 DOA: 04/25/2018 PCP: Marin Olp, MD   Brief Narrative:   [name] [name] is a 77 y.o. F with COPD, hypothyroidism, and history of pubic ramus fractures   presented to the hospital with recurrent hyponatremia.  Patient had routine blood work drawn which showed sodium back down to 120 mmol/L, was asymptomatic but was sent to ER. Initial treatment focused on treatment of SIADH.  Workup of secondary issue (back pain) leg to incidental discovery of widely metastatic lung cancer.  Patient is status post biopsy.    Assessment & Plan:  Metastatic cancer likely lung as primary source, along with thoracic compression fracture.  Pathology shows a metastatic small cell cancer with lung primary. CT scan of the chest abdomen and pelvis showed 3 cm lung mass suspicious for bronchogenic carcinoma with hepatic metastasis and widespread bony metastases.  Radiation oncology on board.  MRI of the brain did not show any lesions.  Post ultrasound-guided liver biopsy on 04/29/2018.   T6 epidural metastasis, impending cord compression with multiple thoracic spine metastasis.  Continue radiation.  On dexamethasone 4 mg 3 times daily, will need to continue on discharge.  Physical therapy evaluation recommended skilled nursing facility.    Pelvic pain.  Likely secondary to history of recent suprapubic fractures/metastatic disease.  Improved  Hyponatremia Recurrent.  Likely from SIADH from lung cancer.  On demeclocycline. Add sodium tabs.  Fluid restriction was advised.  Acute metabolic encephalopathy Improved.  Alert awake oriented  Acute diastolic CHF Improved.  2D echocardiogram showed ejection fraction of 50% with grade 1 diastolic dysfunction.  No prior history of congestive heart failure.  Not on diuretics.  Hypothyroidism Continue Synthroid.  Recent asymptomatic bacteriuria with ESBL Avoid antibiotics unless patient is symptomatic.   Patient had received Carbapenem in the past.  Anxiety Xanax PRN  Debility, deconditioning.  Physical therapy has recommended skilled nursing facility placement.  Disposition: Skilled nursing facility placement.  Continue radiation treatment adequate pain control.  Continue to monitor sodium levels..  DVT prophylaxis: SCDs  Code Status: FULL  Family Communication: No one available at bedside.  Consultants:   IR  Radiation oncology  Procedures:   MR spine  CT chest/abdomen/pelvis  US guided liver biopsy 04/29/2018  Antimicrobials:   None   Subjective: Patient complains of a pain on the back but is slightly better today.  She has difficulty ambulating.  She feels thirsty and complains of fluid restriction.  Objective: Vitals:   05/03/18 0613 05/03/18 1444 05/03/18 2201 05/04/18 0639  BP: (!) 158/88 113/74 116/85 128/72  Pulse: 84 81 76 67  Resp: 17 16 17 17   Temp: 98.1 F (36.7 C) 98.1 F (36.7 C) 98.6 F (37 C) 98 F (36.7 C)  TempSrc: Oral Oral Oral Oral  SpO2: 96% 97% 95% 95%  Weight:      Height:        Intake/Output Summary (Last 24 hours) at 05/04/2018 1054 Last data filed at 05/04/2018 0936 Gross per 24 hour  Intake 910 ml  Output 2225 ml  Net -1315 ml   Filed Weights   04/29/18 0643 05/01/18 0605 05/03/18 0500  Weight: 64.3 kg 65.9 kg 67.4 kg  Physical Examination: General exam: Appears calm and comfortable ,Not in distress HEENT:PERRL,Oral mucosa mildly dry Respiratory system: Bilateral equal air entry, normal vesicular breath sounds, no wheezes or crackles  Cardiovascular system: S1 & S2 heard, RRR.  Gastrointestinal system: Abdomen is nondistended, soft and nontender.  No organomegaly or masses felt. Normal bowel sounds heard. Central nervous system: Alert and oriented. No focal neurological deficits. Extremities: No edema, no clubbing ,no cyanosis, distal peripheral pulses palpable.  Tenderness over the right hip joint area and  spine. Skin: No rashes, lesions or ulcers,no icterus ,no pallor MSK: Normal muscle bulk,tone ,power   Data Reviewed: I have personally reviewed following labs and imaging studies:  CBC: Recent Labs  Lab 04/30/18 0602 05/01/18 0500 05/02/18 0500 05/03/18 0534 05/04/18 0447  WBC 7.5 10.1 12.4* 13.5* 10.4  HGB 10.4* 10.4* 10.3* 10.9* 10.4*  HCT 31.6* 32.0* 32.3* 33.8* 32.4*  MCV 87.1 86.0 89.7 89.2 86.6  PLT 434* 417* 381 391 875*   Basic Metabolic Panel: Recent Labs  Lab 04/30/18 0602 05/01/18 0500 05/02/18 0500 05/03/18 0534 05/04/18 0447  NA 125* 129* 133* 128* 128*  K 3.9 3.8 4.6 4.7 4.7  CL 89* 94* 97* 93* 96*  CO2 26 25 27 27 24   GLUCOSE 152* 180* 124* 105* 122*  BUN 26* 31* 31* 25* 33*  CREATININE 0.88 0.80 0.79 0.72 0.68  CALCIUM 9.2 8.8* 9.1 9.1 8.9   GFR: Estimated Creatinine Clearance: 55.5 mL/min (by C-G formula based on SCr of 0.68 mg/dL). Liver Function Tests: No results for input(s): AST, ALT, ALKPHOS, BILITOT, PROT, ALBUMIN in the last 168 hours. No results for input(s): LIPASE, AMYLASE in the last 168 hours. No results for input(s): AMMONIA in the last 168 hours. Coagulation Profile: Recent Labs  Lab 04/29/18 0947  INR 0.95   Cardiac Enzymes: No results for input(s): CKTOTAL, CKMB, CKMBINDEX, TROPONINI in the last 168 hours. BNP (last 3 results) No results for input(s): PROBNP in the last 8760 hours. HbA1C: No results for input(s): HGBA1C in the last 72 hours. CBG: No results for input(s): GLUCAP in the last 168 hours. Lipid Profile: No results for input(s): CHOL, HDL, LDLCALC, TRIG, CHOLHDL, LDLDIRECT in the last 72 hours. Thyroid Function Tests: No results for input(s): TSH, T4TOTAL, FREET4, T3FREE, THYROIDAB in the last 72 hours. Anemia Panel: No results for input(s): VITAMINB12, FOLATE, FERRITIN, TIBC, IRON, RETICCTPCT in the last 72 hours. Urine analysis:    Component Value Date/Time   COLORURINE YELLOW 04/25/2018 1922    APPEARANCEUR CLEAR 04/25/2018 1922   LABSPEC 1.010 04/25/2018 1922   PHURINE 7.0 04/25/2018 1922   GLUCOSEU NEGATIVE 04/25/2018 1922   GLUCOSEU NEGATIVE 01/28/2018 1055   HGBUR NEGATIVE 04/25/2018 1922   BILIRUBINUR NEGATIVE 04/25/2018 1922   BILIRUBINUR Negative 04/25/2018 1432   KETONESUR 5 (A) 04/25/2018 1922   PROTEINUR NEGATIVE 04/25/2018 1922   UROBILINOGEN 0.2 04/25/2018 1432   UROBILINOGEN 0.2 01/28/2018 1055   NITRITE NEGATIVE 04/25/2018 1922   LEUKOCYTESUR NEGATIVE 04/25/2018 1922   Sepsis Labs: @LABRCNTIP (procalcitonin:4,lacticacidven:4)  ) Recent Results (from the past 240 hour(s))  Urine Culture     Status: None   Collection Time: 04/25/18  2:02 PM  Result Value Ref Range Status   MICRO NUMBER: [id number]  Final   SPECIMEN QUALITY: ADEQUATE  Final   Sample Source NOT GIVEN  Final   STATUS: FINAL  Final   Result: No Growth  Final  Culture, Urine     Status: Abnormal   Collection Time: 04/25/18  9:35 PM  Result Value Ref Range Status   Specimen Description   Final    URINE, CLEAN CATCH Performed at [facility], [place] [address] [place], [place] [address]    Special Requests   Final    NONE Performed at  [facility], [place] [address] [place], [place] [address]    Culture (A)  Final    <10,000 COLONIES/mL INSIGNIFICANT GROWTH Performed at [facility] [address] [place], [place] [address]    Report Status 04/27/2018 FINAL  Final     Radiology Studies: Mr Jeri Cos GN Contrast  Result Date: 05/02/2018 CLINICAL DATA:  Initial evaluation for small cell lung cancer, staging. EXAM: MRI HEAD WITHOUT AND WITH CONTRAST TECHNIQUE: Multiplanar, multiecho pulse sequences of the brain and surrounding structures were obtained without and with intravenous contrast. CONTRAST:  60 cc of Gadavist. COMPARISON:  Prior CT from 07/07/2016 FINDINGS: Brain: Moderately advanced cerebral atrophy with chronic small vessel ischemic disease.  Superimposed remote left thalamic lacunar infarct noted. No abnormal foci of restricted diffusion to suggest acute or subacute ischemia. Gray-white matter differentiation maintained. No encephalomalacia to suggest chronic cortical infarction. No foci of susceptibility artifact to suggest acute or chronic intracranial hemorrhage. No mass lesion, midline shift or mass effect. Ventricles normal size without hydrocephalus. No extra-axial fluid collection. No abnormal enhancement. Apparent punctate focus of enhancement within the subcortical white matter of the left frontal lobe seen on coronal post gadolinium sequence image 15 favored to be artifactual or possibly vascular in nature. No other evidence for intracranial metastatic disease. Pituitary gland grossly within normal limits. Vascular: Major intracranial vascular flow voids maintained. Skull and upper cervical spine: Craniocervical junction normal. Multilevel cervical spondylolysis noted within the upper cervical spine without high-grade stenosis. There is abnormal T1 hypointense signal intensity seen involving the right lateral mass of C1 (series 4, image 8), indeterminate, but concerning for possible osseous metastasis. No other focal marrow replacing lesion identified. Sinuses/Orbits: Globes and orbital soft tissues within normal limits. Right maxillary sinus retention cyst. Paranasal sinuses are otherwise clear. No significant mastoid effusion. Inner ear structures grossly normal. Other: None. IMPRESSION: 1. Abnormal signal intensity involving the right lateral mass of C1, concerning for possible osseous metastasis. 2. No other MRI evidence for intracranial metastatic disease within the brain. 3. No other acute intracranial abnormality. 4. Moderately advanced cerebral atrophy with chronic small vessel ischemic disease. Electronically Signed   By: Jeannine Boga M.D.   On: 05/02/2018 21:39    Scheduled Meds: . demeclocycline  300 mg Oral Q12H  .  dexamethasone  4 mg Oral Q8H  . feeding supplement (ENSURE ENLIVE)  237 mL Oral BID BM  . levothyroxine  75 mcg Oral QAC breakfast  . polyethylene glycol  17 g Oral Daily  . sodium chloride flush  10 mL Intravenous Q12H  . sodium chloride  1 g Oral TID WC   Continuous Infusions:   LOS: 8 days    Time spent: 25 minutes. More than 50% of the time was spent in coordination of care.   Flora Lipps, MD [facility] 05/04/2018, 10:54 AM   [email or web address] Password [email or web address] If 7PM-7AM, please contact night-coverage

## 2018-05-05 LAB — BASIC METABOLIC PANEL
ANION GAP: 7 (ref 5–15)
BUN: 35 mg/dL — ABNORMAL HIGH (ref 8–23)
CALCIUM: 8.7 mg/dL — AB (ref 8.9–10.3)
CO2: 28 mmol/L (ref 22–32)
Chloride: 95 mmol/L — ABNORMAL LOW (ref 98–111)
Creatinine, Ser: 0.65 mg/dL (ref 0.44–1.00)
GFR calc Af Amer: >60 mL/min (ref >60)
Glucose, Bld: 114 mg/dL — ABNORMAL HIGH (ref 70–99)
Potassium: 5 mmol/L (ref 3.5–5.1)
Sodium: 130 mmol/L — ABNORMAL LOW (ref 135–145)

## 2018-05-05 LAB — CBC
HCT: 33.8 % — ABNORMAL LOW (ref 36.0–46.0)
HEMOGLOBIN: 10.9 g/dL — AB (ref 12.0–15.0)
MCH: 28 pg (ref 26.0–34.0)
MCHC: 32.2 g/dL (ref 30.0–36.0)
MCV: 86.9 fL (ref 80.0–100.0)
PLATELETS: 443 10*3/uL — AB (ref 150–400)
RBC: 3.89 MIL/uL (ref 3.87–5.11)
RDW: 14.6 % (ref 11.5–15.5)
WBC: 19.6 10*3/uL — ABNORMAL HIGH (ref 4.0–10.5)
nRBC: 0 % (ref 0.0–0.2)

## 2018-05-05 MED ORDER — MORPHINE SULFATE (PF) 2 MG/ML IV SOLN
2.0000 mg | INTRAVENOUS | Status: AC | PRN
  Administered 2018-05-05 (×2): 2 mg via INTRAVENOUS
  Filled 2018-05-05 (×2): qty 1

## 2018-05-05 NOTE — Progress Notes (Signed)
PROGRESS NOTE    [name] [name]  ELF:[id number] DOB: March 11, 1941 DOA: 04/25/2018 PCP: Marin Olp, MD   Brief Narrative:   [name] [name] is a 77 y.o. F with for but not limited to COPD,  Admitted for metastatic lung cancer with hyponatremia and thoracic compression fracture.  Assessment & Plan:  Metastatic cancer likely lung as primary source, along with thoracic compression fracture   -CT scan of the chest abdomen and pelvis showed 3 cm lung mass suspicious for bronchogenic carcinoma with hepatic metastasis and widespread bony metastases. -MRI of the brain did not show any lesions.  -Bx reveals metastatic small cell cancer with lung primary   T6 epidural metastasis, impending cord compression with multiple thoracic spine metastasis  Continue radiation.   On dexamethasone 4 mg 3 times daily, will need to continue on discharge.   Physical therapy evaluation recommended skilled nursing facility.    Pelvic pain Likely secondary to history of recent suprapubic fractures/metastatic disease.  supportive care with clinical follow-up a  Hyponatremia Recurrent.  Likely from SIADH from lung cancer.  On demeclocycline. Add sodium tabs.  Fluid restriction was advised.  Acute metabolic encephalopathy Iimproing  Acute diastolic CHF  2D echocardiogram showed ejection fraction of 50% with grade 1 diastolic dysfunction.   No prior history of congestive heart failure.   Not on diuretics.  Hypothyroidism Continue Synthroid.  Recent asymptomatic bacteriuria with ESBL Avoid antibiotics unless patient is symptomatic.  Patient had received Carbapenem in the past.  Anxiety Xanax PRN  Debility, deconditioning.  Physical therapy has recommended skilled nursing facility placement.  Disposition: Skilled nursing facility placement.  Continue radiation treatment adequate pain control.  Continue to monitor sodium levels..  DVT prophylaxis: SCDs  Code Status: FULL  Family  Communication: No one available at bedside.  Consultants:   IR  Radiation oncology  Procedures:   MR spine  CT chest/abdomen/pelvis  US guided liver biopsy 04/29/2018  Antimicrobials:   None   Subjective:  no new complaints a  Objective: Vitals:   05/04/18 1225 05/04/18 2134 05/05/18 0113 05/05/18 0650  BP: 121/79 113/82 (!) 153/105 137/89  Pulse: 75 79 74 74  Resp: 16 16  16   Temp: 97.8 F (36.6 C) 98.2 F (36.8 C)  97.8 F (36.6 C)  TempSrc: Oral Oral  Oral  SpO2: 95% 95% 97% 97%  Weight:    68 kg  Height:        Intake/Output Summary (Last 24 hours) at 05/05/2018 0851 Last data filed at 05/05/2018 0530 Gross per 24 hour  Intake 998 ml  Output 725 ml  Net 273 ml   Filed Weights   05/01/18 0605 05/03/18 0500 05/05/18 0650  Weight: 65.9 kg 67.4 kg 68 kg  Physical Examination: General exam: NAD, not in distress HEENT:PERRL,Oral mucosa mildly dry Respiratory system: Bilateral equal air entry, normal vesicular breath sounds, no wheezes or crackles  Cardiovascular system: S1 & S2 heard, RRR.  Gastrointestinal system: Abdomen is nondistended, soft and nontender. No organomegaly or masses felt. Normal bowel sounds heard. Central nervous system: Alert and oriented. No focal neurological deficits. Extremities: No edema, no clubbing ,no cyanosis, distal peripheral pulses palpable.  Tenderness over the right hip joint area and spine. Skin: No rashes, lesions or ulcers,no icterus ,no pallor MSK: Normal muscle bulk,tone ,power   Data Reviewed: I have personally reviewed following labs and imaging studies:  CBC: Recent Labs  Lab 05/01/18 0500 05/02/18 0500 05/03/18 0534 05/04/18 0447 05/05/18 0523  WBC 10.1 12.4* 13.5*  10.4 19.6*  HGB 10.4* 10.3* 10.9* 10.4* 10.9*  HCT 32.0* 32.3* 33.8* 32.4* 33.8*  MCV 86.0 89.7 89.2 86.6 86.9  PLT 417* 381 391 414* 604*   Basic Metabolic Panel: Recent Labs  Lab 05/01/18 0500 05/02/18 0500 05/03/18 0534  05/04/18 0447 05/05/18 0523  NA 129* 133* 128* 128* 130*  K 3.8 4.6 4.7 4.7 5.0  CL 94* 97* 93* 96* 95*  CO2 25 27 27 24 28   GLUCOSE 180* 124* 105* 122* 114*  BUN 31* 31* 25* 33* 35*  CREATININE 0.80 0.79 0.72 0.68 0.65  CALCIUM 8.8* 9.1 9.1 8.9 8.7*   GFR: Estimated Creatinine Clearance: 55.5 mL/min (by C-G formula based on SCr of 0.65 mg/dL). Liver Function Tests: No results for input(s): AST, ALT, ALKPHOS, BILITOT, PROT, ALBUMIN in the last 168 hours. No results for input(s): LIPASE, AMYLASE in the last 168 hours. No results for input(s): AMMONIA in the last 168 hours. Coagulation Profile: Recent Labs  Lab 04/29/18 0947  INR 0.95   Cardiac Enzymes: No results for input(s): CKTOTAL, CKMB, CKMBINDEX, TROPONINI in the last 168 hours. BNP (last 3 results) No results for input(s): PROBNP in the last 8760 hours. HbA1C: No results for input(s): HGBA1C in the last 72 hours. CBG: No results for input(s): GLUCAP in the last 168 hours. Lipid Profile: No results for input(s): CHOL, HDL, LDLCALC, TRIG, CHOLHDL, LDLDIRECT in the last 72 hours. Thyroid Function Tests: No results for input(s): TSH, T4TOTAL, FREET4, T3FREE, THYROIDAB in the last 72 hours. Anemia Panel: No results for input(s): VITAMINB12, FOLATE, FERRITIN, TIBC, IRON, RETICCTPCT in the last 72 hours. Urine analysis:    Component Value Date/Time   COLORURINE YELLOW 04/25/2018 1922   APPEARANCEUR CLEAR 04/25/2018 1922   LABSPEC 1.010 04/25/2018 1922   PHURINE 7.0 04/25/2018 1922   GLUCOSEU NEGATIVE 04/25/2018 1922   GLUCOSEU NEGATIVE 01/28/2018 1055   HGBUR NEGATIVE 04/25/2018 1922   BILIRUBINUR NEGATIVE 04/25/2018 1922   BILIRUBINUR Negative 04/25/2018 1432   KETONESUR 5 (A) 04/25/2018 1922   PROTEINUR NEGATIVE 04/25/2018 1922   UROBILINOGEN 0.2 04/25/2018 1432   UROBILINOGEN 0.2 01/28/2018 1055   NITRITE NEGATIVE 04/25/2018 1922   LEUKOCYTESUR NEGATIVE 04/25/2018 1922   Sepsis  Labs: @LABRCNTIP (procalcitonin:4,lacticacidven:4)  ) Recent Results (from the past 240 hour(s))  Urine Culture     Status: None   Collection Time: 04/25/18  2:02 PM  Result Value Ref Range Status   MICRO NUMBER: [id number]  Final   SPECIMEN QUALITY: ADEQUATE  Final   Sample Source NOT GIVEN  Final   STATUS: FINAL  Final   Result: No Growth  Final  Culture, Urine     Status: Abnormal   Collection Time: 04/25/18  9:35 PM  Result Value Ref Range Status   Specimen Description   Final    URINE, CLEAN CATCH Performed at [facility], [place] [address] [place], [place] [address]    Special Requests   Final    NONE Performed at [facility], [place] [address], [place], [place] [address]    Culture (A)  Final    <10,000 COLONIES/mL INSIGNIFICANT GROWTH Performed at [facility] [address], [place], [place] [address]    Report Status 04/27/2018 FINAL  Final     Radiology Studies: No results found.  Scheduled Meds: . demeclocycline  300 mg Oral Q12H  . dexamethasone  4 mg Oral Q8H  . feeding supplement (ENSURE ENLIVE)  237 mL Oral BID BM  .  levothyroxine  75 mcg Oral QAC breakfast  . polyethylene glycol  17 g Oral Daily  . sodium chloride flush  10 mL Intravenous Q12H  . sodium chloride  1 g Oral TID WC   Continuous Infusions:   LOS: 9 days    Time spent: 25 minutes. More than 50% of the time was spent in coordination of care.   Benito Mccreedy, MD [facility] 05/05/2018, 8:51 AM   [email or web address] Password [email or web address] If 7PM-7AM, please contact night-coverage

## 2018-05-06 ENCOUNTER — Other Ambulatory Visit: Payer: Self-pay | Admitting: Internal Medicine

## 2018-05-06 ENCOUNTER — Ambulatory Visit
Admit: 2018-05-06 | Discharge: 2018-05-06 | Disposition: A | Discharge status: Home / Self Care | Payer: Medicare Other | Attending: Radiation Oncology | Admitting: Radiation Oncology

## 2018-05-06 ENCOUNTER — Encounter: Payer: Self-pay | Admitting: *Deleted

## 2018-05-06 ENCOUNTER — Telehealth: Payer: Self-pay | Admitting: Radiation Oncology

## 2018-05-06 DIAGNOSIS — C787 Secondary malignant neoplasm of liver and intrahepatic bile duct: Secondary | ICD-10-CM

## 2018-05-06 DIAGNOSIS — C7951 Secondary malignant neoplasm of bone: Principal | ICD-10-CM

## 2018-05-06 DIAGNOSIS — C3432 Malignant neoplasm of lower lobe, left bronchus or lung: Secondary | ICD-10-CM | POA: Insufficient documentation

## 2018-05-06 DIAGNOSIS — F17210 Nicotine dependence, cigarettes, uncomplicated: Secondary | ICD-10-CM

## 2018-05-06 DIAGNOSIS — E871 Hypo-osmolality and hyponatremia: Secondary | ICD-10-CM

## 2018-05-06 MED ORDER — HYDROCODONE-ACETAMINOPHEN 10-325 MG PO TABS
1.0000 | ORAL_TABLET | Freq: Four times a day (QID) | ORAL | Status: DC | PRN
  Administered 2018-05-06 – 2018-05-11 (×13): 1 via ORAL
  Filled 2018-05-06 (×14): qty 1

## 2018-05-06 MED ORDER — SENNA 8.6 MG PO TABS: 1.0000 | ORAL_TABLET | Freq: Every day | ORAL | Status: DC

## 2018-05-06 MED ORDER — FENTANYL 25 MCG/HR TD PT72
25.0000 ug | MEDICATED_PATCH | TRANSDERMAL | Status: DC
  Administered 2018-05-06 – 2018-05-09 (×2): 25 ug via TRANSDERMAL
  Filled 2018-05-06 (×2): qty 1

## 2018-05-06 MED ORDER — MORPHINE SULFATE ER 15 MG PO TBCR
15.0000 mg | EXTENDED_RELEASE_TABLET | Freq: Two times a day (BID) | ORAL | Status: DC
  Administered 2018-05-06 – 2018-05-11 (×11): 15 mg via ORAL
  Filled 2018-05-06 (×11): qty 1

## 2018-05-06 NOTE — Care Management Important Message (Signed)
Important Message  Patient Details  Name: CHRISSIE DACQUISTO MRN: [id number] Date of Birth: 03-29-1941   Medicare Important Message Given:  Yes    Kerin Salen 05/06/2018, 11:18 AMImportant Message  Patient Details  Name: GEORGANNA MAXSON MRN: [id number] Date of Birth: 08-Apr-1941   Medicare Important Message Given:  Yes    Kerin Salen 05/06/2018, 11:17 AM

## 2018-05-06 NOTE — Telephone Encounter (Signed)
I spoke with the patient's son after he called and wanted an update on where we stand with his mother's care. We discussed continuation of Dexamethasone and XRT with the hopes of improving neurologic symptoms. He had questions about chemotherapy being inpatient as well. I will follow up with medical oncology to review this.

## 2018-05-06 NOTE — Progress Notes (Signed)
START ON PATHWAY REGIMEN - Small Cell Lung     Cycles 1 through 4, every 21 days:     Atezolizumab      Carboplatin      Etoposide    Cycles 5 and beyond, every 21 days:     Atezolizumab   **Always confirm dose/schedule in your pharmacy ordering system**  Patient Characteristics: Newly Diagnosed, Preoperative or Nonsurgical Candidate (Clinical Staging), First Line, Extensive Stage Therapeutic Status: Newly Diagnosed, Preoperative or Nonsurgical Candidate (Clinical Staging) AJCC T Category: cT2a AJCC N Category: cN3 AJCC M Category: cM1c AJCC 8 Stage Grouping: IVB Stage Classification: Extensive Intent of Therapy: Non-Curative / Palliative Intent, Discussed with Patient

## 2018-05-06 NOTE — Progress Notes (Signed)
[facility] Therapy Treatment Record Phone [phone]   Radiation Therapy was administered to [name] [name] on: 05/06/2018  3:30 PM and was treatment # 3 out of a planned course of 10 treatments.  Radiation Treatment  1). Beam photons with 6-10 energy  2). Brachytherapy None  3). Stereotactic Radiosurgery None  4). Other Radiation None     Shneur [place] F Anik Wesch, RT (T)

## 2018-05-06 NOTE — Progress Notes (Signed)
Oncology Nurse Navigator Documentation  Oncology Nurse Navigator Flowsheets 05/06/2018  Navigator Location [facility]  Navigator Encounter Type Other/Dr. Julien Nordmann updated me that he saw [name] [name] a new patient referral today in the hospital.  I will update scheduling and new patient coordinator that she needs a follow up once discharged.   Patient Visit Type Inpatient  Treatment Phase Treatment  Barriers/Navigation Needs Coordination of Care  Interventions Coordination of Care  Coordination of Care Other  Acuity Level 2  Time Spent with Patient 30

## 2018-05-06 NOTE — Progress Notes (Signed)
Physical Therapy Treatment Patient Details Name: [name] [name] MRN: [id number] DOB: 1940/07/30 Today's Date: 05/06/2018    History of Present Illness 77 yo female admitted  with hyponatremia secondary to lung CA with widespread mets; Recent hx of R pubic ramus fx, sacral fx, R L5 transverse process fx. Hx of COPD, cigarette smoker, liver mass, hypothyroidism, chronic back pain. MRI=Bulky dorsal epidural tumor at T6 results in significant cord    PT Comments    Pt and family disheartened this session due to pt's cancer diagnosis. Pt willing to participate in PT, family more hesitant this session. Pt with increasing unsteadiness in standing, relying on RW and PT to correct balance impairments. Pt also with potential sensation changes to feet and inner thigh regions, as pt states her feet feel "numb-ish" when touched by PT, and pt reports her inner thighs feel strange repeatedly throughout session especially with sitting and standing. PT downgrading goals at this time due to pt's current physical presentation. Will continue to follow.    Follow Up Recommendations  Supervision/Assistance - 24 hour;SNF(vs SNF dependent on biopsy results and medical needs )     Equipment Recommendations  None recommended by PT    Recommendations for Other Services       Precautions / Restrictions Precautions Precautions: Fall Precaution Comments: pubic ramus fracture in October, mets to spine  Restrictions Weight Bearing Restrictions: No Other Position/Activity Restrictions: WBAT    Mobility  Bed Mobility Overal bed mobility: Needs Assistance Bed Mobility: Supine to Sit     Supine to sit: Min assist     General bed mobility comments: Pt instructed in log roll technique, but pt initiated supine to sit before PT could correct pt to rolling. Min assist for LE management, scooting to EOB. Sat EOB ~10 minutes during session to perform LE exerises.   Transfers Overall transfer level: Needs  assistance Equipment used: Rolling walker (2 wheeled) Transfers: Sit to/from [facility] Sit to Stand: Min assist;From elevated surface Stand pivot transfers: Mod assist;From elevated surface       General transfer comment: Sit to stand x5 for LE strengthening, increased time and effort. Pt with lateral leaning to left during one sit to stand, PT recovered pt's balance by force application through gait belt and by steadying RW. Pt performed another sit to stand for pericare because pt had small BM. Pt with stand pivot to chair with mod assist for steadying and RW management.    Ambulation/Gait Ambulation/Gait assistance: (NT- pt unsteady in static standing )               Stairs             Wheelchair Mobility    Modified Rankin (Stroke Patients Only)       Balance Overall balance assessment: Needs assistance;History of Falls Sitting-balance support: No upper extremity supported Sitting balance-Leahy Scale: Fair     Standing balance support: During functional activity;Bilateral upper extremity supported Standing balance-Leahy Scale: Poor Standing balance comment: relies heavily on RW and PT steadying, increasingly unsteady today                             Cognition Arousal/Alertness: Awake/alert Behavior During Therapy: WFL for tasks assessed/performed Overall Cognitive Status: Within Functional Limits for tasks assessed  General Comments: Pt perseverating on RLE being "fatter" than LLE, and that it looks "weird" to her. PT assessed, and no swelling or tenderness noted. Pt continued to bring this up throughout the session. Pt with less control of bowel/bladder since last visit.       Exercises General Exercises - Lower Extremity Long Arc Quad: AROM;Both;10 reps;Seated Hip Flexion/Marching: AROM;Both;Seated;10 reps Mini-Sqauts: AROM;5 reps;Seated;Standing(sit to stands from elevated bed  )    General Comments        Pertinent Vitals/Pain Pain Assessment: Faces Faces Pain Scale: Hurts little more Pain Location: back  Pain Descriptors / Indicators: Discomfort;Sore Pain Intervention(s): Limited activity within patient's tolerance;Repositioned;Monitored during session    Home Living                      Prior Function            PT Goals (current goals can now be found in the care plan section) Acute Rehab PT Goals Patient Stated Goal: none stated  PT Goal Formulation: With patient Time For Goal Achievement: 05/17/18 Potential to Achieve Goals: Good Progress towards PT goals: Goals downgraded-see care plan    Frequency    Min 2X/week      PT Plan Current plan remains appropriate    Co-evaluation              AM-PAC PT "6 Clicks" Daily Activity  Outcome Measure  Difficulty turning over in bed (including adjusting bedclothes, sheets and blankets)?: Unable Difficulty moving from lying on back to sitting on the side of the bed? : Unable Difficulty sitting down on and standing up from a chair with arms (e.g., wheelchair, bedside commode, etc,.)?: Unable Help needed moving to and from a bed to chair (including a wheelchair)?: A Little Help needed walking in hospital room?: A Lot Help needed climbing 3-5 steps with a railing? : A Lot 6 Click Score: 10    End of Session Equipment Utilized During Treatment: Gait belt Activity Tolerance: Patient limited by fatigue Patient left: with call bell/phone within reach;in chair;with chair alarm set Nurse Communication: Mobility status PT Visit Diagnosis: Muscle weakness (generalized) (M62.81);Other abnormalities of gait and mobility (R26.89)     Time: 2703-5009 PT Time Calculation (min) (ACUTE ONLY): 32 min  Charges:  $Therapeutic Exercise: 8-22 mins $Therapeutic Activity: 8-22 mins                     Branon Sabine Conception Chancy, PT Acute Rehabilitation Services Pager [phone]  Office  [phone]    Lamanda Rudder D [name] 05/06/2018, 3:28 PM

## 2018-05-06 NOTE — Consult Note (Signed)
[place] [phone]   [phone]  CONSULT NOTE  REFERRING PHYSICIAN: Dr. Iona Beard Osei-Bonsu  REASON FOR CONSULTATION:  77 years old white female recently diagnosed with lung cancer.  HPI [name] [name] is a 77 y.o. female with past medical history significant for COPD, hearing loss, diverticulosis, lumbar back pain, dyslipidemia as well as long history of smoking.  The patient mentioned that in the early October 2019 she was complaining of increasing weakness and fatigue.  She called EMS and was brought to the emergency department for evaluation.  During her evaluation she was found to have hyponatremia with sodium of 117.  The patient was a started on oral fluid restriction as well as medication to help for her hypokalemia including IV normal saline.  Her sodium improved to 130 and the patient was discharged to a skilled nursing facility.  She did not spend too much time there and she had more weakness and fatigue and she was readmitted again to the hospital for evaluation.  She has also been complaining of back pain since August 2019 and was treated symptomatically with pain management.  MRI of the thoracic spine was performed on April 28, 2018 and that showed widespread metastatic disease involving numerous thoracic vertebrae including T1, T2, T5, T6, T7, T9, T11 and T12 with pathologic fracture of T9.  There was bulky dorsal epidural tumor at T6 resulting significant cord compression.  The MRI showed bilateral effusion greater on the left with suspected left hilar mass.  CT scan of the chest, abdomen and pelvis performed on April 28, 2018 showed 3.3 x 1.8 x 3.3 cm mass in the lateral aspect of the left upper and lower lobes which appears to extend into both the left upper and lower lobes with associated mediastinal and bilateral hilar adenopathy highly concerning for primary bronchogenic neoplasm.  There was also widespread metastatic disease to the  bones and liver.  On April 29, 2018 the patient underwent ultrasound-guided core biopsy of 1 of the liver lesion by interventional radiology.  The final pathology ([id number]) was consistent with metastatic small cell carcinoma.  The immunohistochemical stains showed the tumor cells were positive for synaptophysin, CD 56, TTF-1 and A1/A3 consistent with small cell carcinoma of the lung.  MRI of the brain on 05/02/2018 showed abnormal signal intensity involving the right lateral mass of C1 concerning for possible osseous metastasis.  There was no other MRI evidence for intracranial metastatic disease within the brain and no other acute intracranial abnormalities. Dr. Vista Lawman kindly asked me to see the patient today for further evaluation and recommendation regarding her condition. When seen today the patient continues to complain of weakness in the lower extremities and now she is unable to walk for much.  She was a started on palliative radiotherapy to the metastatic and epidural disease at T6.  The patient denied having any incontinence to urine or stool.  She denied having any current chest pain, shortness of breath, cough or hemoptysis.  She denied having any fever or chills.  She has no nausea, vomiting, diarrhea or constipation. Family history significant for sister died from pancreatic cancer at age 44.  Father had diabetes and mother had heart attack. The patient is single and has 2 sons.  She has a history of smoking for around 4 years with no history of alcohol or drug abuse.  HPI  Past Medical History:  Diagnosis Date  . Asymptomatic varicose veins   . Cataract  beginnings  . Cigarette smoker   . COPD (chronic obstructive pulmonary disease) ([place])   . Diverticulosis of colon (without mention of hemorrhage)   . Headache(784.0)   . Hearing loss    wears hearing aides  . Lumbar back pain   . Pure hypercholesterolemia   . Stress disorder, acute   . Varicose veins     Past  Surgical History:  Procedure Laterality Date  . COLONOSCOPY  04-22-2004  . LUMBAR SPINE SURGERY     [phone]  . varicose vein treatment     at [facility]    Family History  Problem Relation Age of Onset  . Diabetes Father   . Heart attack Mother 14       nonsmoker  . Colon cancer Neg Hx   . Rectal cancer Neg Hx   . Stomach cancer Neg Hx     Social History Social History   Tobacco Use  . Smoking status: Current Every Day Smoker    Packs/day: 0.50    Years: 40.00    Pack years: 20.00    Types: Cigarettes  . Smokeless tobacco: Never Used  . Tobacco comment: 1/2-3/4 of a pk a day   Substance Use Topics  . Alcohol use: No    Alcohol/week: 0.0 standard drinks  . Drug use: No    Allergies  Allergen Reactions  . Atorvastatin     Intolerant to all statins   . Cyclobenzaprine Other (See Comments)    Bradycardia, hypotension  . Ezetimibe      INTOL to Zetia  . Rosuvastatin     intolerant to all statins  . Gadolinium Derivatives Nausea Only    Pt became very nauseous after gadolinium administration//lh  . Nicoderm [Nicotine] Rash    Unable to use the patches---caused severe rash to area placed    Current Facility-Administered Medications  Medication Dose Route Frequency Provider Last Rate Last Dose  . ALPRAZolam Duanne Moron) tablet 0.5 mg  0.5 mg Oral BID PRN Edwin Dada, MD   0.5 mg at 05/05/18 2203  . demeclocycline (DECLOMYCIN) tablet 300 mg  300 mg Oral Q12H Danford, Suann Larry, MD   300 mg at 05/05/18 2203  . dexamethasone (DECADRON) tablet 4 mg  4 mg Oral Q8H Danford, Suann Larry, MD   4 mg at 05/06/18 0534  . feeding supplement (ENSURE ENLIVE) (ENSURE ENLIVE) liquid 237 mL  237 mL Oral BID BM Danford, Suann Larry, MD   237 mL at 05/05/18 1518  . HYDROcodone-acetaminophen (NORCO/VICODIN) 5-325 MG per tablet 2 tablet  2 tablet Oral Q4H PRN Flora Lipps, MD   2 tablet at 05/06/18 0534  . levothyroxine (SYNTHROID, LEVOTHROID) tablet 75 mcg   75 mcg Oral QAC breakfast Shela Leff, MD   75 mcg at 05/06/18 0527  . naphazoline-pheniramine (NAPHCON-A) 0.025-0.3 % ophthalmic solution 1 drop  1 drop Both Eyes QID PRN Edwin Dada, MD   1 drop at 05/03/18 2234  . ondansetron (ZOFRAN) injection 4 mg  4 mg Intravenous Q6H PRN Pokhrel, Laxman, MD   4 mg at 05/04/18 0825  . polyethylene glycol (MIRALAX / GLYCOLAX) packet 17 g  17 g Oral Daily Pokhrel, Laxman, MD   17 g at 05/05/18 1032  . senna (SENOKOT) tablet 8.6 mg  1 tablet Oral Daily PRN Edwin Dada, MD   8.6 mg at 04/28/18 2227  . sodium chloride flush (NS) 0.9 % injection 10 mL  10 mL Intravenous Q12H Danford, Suann Larry, MD  10 mL at 05/05/18 2203  . sodium chloride tablet 1 g  1 g Oral TID WC Pokhrel, Laxman, MD   1 g at 05/05/18 1713    Review of Systems  Constitutional: positive for fatigue Eyes: negative Ears, nose, mouth, throat, and face: negative Respiratory: negative Cardiovascular: negative Gastrointestinal: negative Genitourinary:negative Integument/breast: negative Hematologic/lymphatic: negative Musculoskeletal:positive for back pain and muscle weakness Neurological: positive for weakness Behavioral/Psych: negative Endocrine: negative Allergic/Immunologic: negative  Physical Exam  BPZ:WCHEN, healthy, no distress, well nourished, well developed and anxious SKIN: skin color, texture, turgor are normal, no rashes or significant lesions HEAD: Normocephalic, No masses, lesions, tenderness or abnormalities EYES: normal, PERRLA, Conjunctiva are pink and non-injected EARS: External ears normal, Canals clear OROPHARYNX:no exudate, no erythema and lips, buccal mucosa, and tongue normal  NECK: supple, no adenopathy, no JVD LYMPH:  no palpable lymphadenopathy, no hepatosplenomegaly BREAST:not examined LUNGS: clear to auscultation , and palpation HEART: regular rate & rhythm, no murmurs and no gallops ABDOMEN:abdomen soft, non-tender,  normal bowel sounds and no masses or organomegaly BACK: Back symmetric, no curvature., No CVA tenderness EXTREMITIES:no joint deformities, effusion, or inflammation, no edema  NEURO: alert & oriented x 3 with fluent speech, no focal motor/sensory deficits  PERFORMANCE STATUS: ECOG 1  LABORATORY DATA: Lab Results  Component Value Date   WBC 19.6 (H) 05/05/2018   HGB 10.9 (L) 05/05/2018   HCT 33.8 (L) 05/05/2018   MCV 86.9 05/05/2018   PLT 443 (H) 05/05/2018    @LASTCHEM @  RADIOGRAPHIC STUDIES: Dg Chest 2 View  Result Date: 04/27/2018 CLINICAL DATA:  Low back pain 3 months.  No trauma. EXAM: CHEST - 2 VIEW COMPARISON:  03/23/2018 and thoracic spine 02/01/2018 FINDINGS: Right-sided PICC line has tip over the SVC. Lungs are adequately inflated with hazy prominence of the perihilar markings suggesting interstitial edema. Small amount of posterior pleural fluid on the lateral film. Cardiomediastinal silhouette is within normal. Mild degenerate change of the spine. Mild T9 compression fracture new since 02/01/2018 but unchanged from the recent chest radiograph. IMPRESSION: Evidence of mild interstitial edema with small bilateral pleural effusions. Mild T9 compression fracture age indeterminate but new since 02/01/2018. Right-sided PICC line with tip over the SVC. Electronically Signed   By: Marin Olp M.D.   On: 04/27/2018 12:06   Dg Thoracic Spine 2 View  Result Date: 04/27/2018 CLINICAL DATA:  Back pain 3 months.  No trauma. EXAM: THORACIC SPINE 2 VIEWS COMPARISON:  Chest x-ray 03/23/2018 and thoracic spine 02/01/2018 FINDINGS: Right-sided PICC line with tip over the SVC. Vertebral body alignment is normal. Pedicles are intact. There is mild spondylosis throughout the thoracic spine. There is mild loss of height of T9 new since 02/01/2018. Remainder of the exam is unchanged. IMPRESSION: Mild T9 compression fracture age indeterminate but new since 02/01/2018. Mild spondylosis of the thoracic  spine. Electronically Signed   By: Marin Olp M.D.   On: 04/27/2018 12:03   Dg Lumbar Spine 2-3 Views  Result Date: 04/27/2018 CLINICAL DATA:  Low back pain 3 months.  No trauma. EXAM: LUMBAR SPINE - 2-3 VIEW COMPARISON:  CT 04/01/2018 FINDINGS: Subtle curvature convex left. Vertebral body heights are maintained. There is moderate spondylosis throughout the lumbar spine to include facet arthropathy. Significant multilevel disc disease at all levels of the lumbar spine. No acute compression fracture or subluxation. Calcified plaque over the abdominal aorta. Evidence of patient's known right superior pubic ramus/acetabular fracture. IMPRESSION: Moderate spondylosis of the lumbar spine with moderate multilevel disc disease. Known  right acetabular/superior pubic ramus fracture. Electronically Signed   By: Marin Olp M.D.   On: 04/27/2018 11:59   Ct Chest W Contrast  Result Date: 04/29/2018 CLINICAL DATA:  77 year old female with history of metastatic disease to the spine. Unknown primary neoplasm. EXAM: CT CHEST, ABDOMEN, AND PELVIS WITH CONTRAST TECHNIQUE: Multidetector CT imaging of the chest, abdomen and pelvis was performed following the standard protocol during bolus administration of intravenous contrast. CONTRAST:  154mL OMNIPAQUE IOHEXOL 300 MG/ML  SOLN COMPARISON:  CT the abdomen and pelvis 04/01/2018. No prior chest CT. FINDINGS: CT CHEST FINDINGS Cardiovascular: Heart size is borderline enlarged. There is no significant pericardial fluid, thickening or pericardial calcification. There is aortic atherosclerosis, as well as atherosclerosis of the great vessels of the mediastinum and the coronary arteries, including calcified atherosclerotic plaque in the left main, left anterior descending, left circumflex and right coronary arteries. Mediastinum/Nodes: Multiple prominent borderline enlarged and enlarged mediastinal and bilateral hilar lymph nodes are noted. The largest of these include 2.3 cm  low right paratracheal lymph node, 2.1 cm subcarinal lymph node, 1.9 cm AP window lymph node and a 2.2 cm left hilar lymph node. Esophagus is unremarkable in appearance. No axillary lymphadenopathy. Lungs/Pleura: Small bilateral pleural effusions lying dependently with some associated passive subsegmental atelectasis in the lower lobes of the lungs bilaterally. There is a background of interlobular septal thickening, suggesting underlying pulmonary edema. In the lateral aspect of the left major fissure there is a macrolobulated mass which measures 3.3 x 1.8 x 3.3 cm (axial image 77 of series 6 and sagittal image 136 of series 8) which appears to extend into both the left upper and lower lobes. Musculoskeletal: Innumerable predominantly lytic lesions are noted throughout the visualized axial and appendicular skeleton, compatible with widespread metastatic disease to the bones. There is an associated pathologic compression fracture of superior endplate of T9 with approximately 30% loss of anterior vertebral body height. CT ABDOMEN PELVIS FINDINGS Hepatobiliary: There are several hypovascular hepatic lesions in the liver measuring 3.8 x 2.7 cm in segment 8 (axial image 51 of series 2), 2.8 x 3.4 cm in segment 4B/5 (axial image 71 of series 2) and 1.5 x 1.5 cm in the central aspect of segment 4A (axial image 59 of series 2). No intra or extrahepatic biliary ductal dilatation. Gallbladder is moderately distended, but otherwise unremarkable in appearance. Pancreas: No pancreatic mass. No pancreatic ductal dilatation. No pancreatic or peripancreatic fluid or inflammatory changes. Spleen: Unremarkable. Adrenals/Urinary Tract: Subcentimeter low-attenuation lesions in the lower pole of the right kidney are too small to definitively characterize, but are statistically likely to represent tiny cysts. Left kidney and bilateral adrenal glands are normal in appearance. No hydroureteronephrosis. Urinary bladder is normal in  appearance. Stomach/Bowel: The appearance of the stomach is normal. There is no pathologic dilatation of small bowel or colon. Numerous colonic diverticulae are noted, particularly in the sigmoid colon, without surrounding inflammatory changes to suggest an acute diverticulitis at this time. The appendix is not confidently identified and may be surgically absent. Regardless, there are no inflammatory changes noted adjacent to the cecum to suggest the presence of an acute appendicitis at this time. Vascular/Lymphatic: Aortic atherosclerosis, without evidence of aneurysm or dissection in the abdominal or pelvic vasculature. No lymphadenopathy noted in the abdomen or pelvis. Reproductive: Uterus and ovaries are unremarkable in appearance. Other: No significant volume of ascites. No pneumoperitoneum. Musculoskeletal: Multiple small lytic lesions scattered throughout the visualized axial and appendicular skeleton, compatible with widespread metastatic disease to  the bones. Nondisplaced healing fractures of the right superior and inferior pubic rami. Nondisplaced fractures through both sacral ala. IMPRESSION: 1. 3.3 x 1.8 x 3.3 cm mass in the lateral aspect of the left upper and lower lobes which appears to extend into both the left upper and lower lobes, with associated mediastinal and bilateral hilar lymphadenopathy, as detailed above, highly concerning for primary bronchogenic neoplasm. Further evaluation with PET-CT and/or biopsy is recommended in the near future for diagnostic and staging purposes. 2. 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 3. Heart size is borderline enlarged, and there is evidence of mild interstitial pulmonary edema and small bilateral pleural effusions; imaging findings suggestive of congestive heart failure. 4. Aortic atherosclerosis, in addition to left main and 3 vessel coronary artery disease. Assessment for potential risk factor modification, dietary therapy or  pharmacologic therapy may be warranted, if clinically indicated. 5. Colonic diverticulosis without evidence of acute diverticulitis at this time. 6. Additional incidental findings, as above. Aortic Atherosclerosis (ICD10-I70.0). Electronically Signed   By: Vinnie Langton M.D.   On: 04/29/2018 07:31   Mr Jeri Cos YS Contrast  Result Date: 05/02/2018 CLINICAL DATA:  Initial evaluation for small cell lung cancer, staging. EXAM: MRI HEAD WITHOUT AND WITH CONTRAST TECHNIQUE: Multiplanar, multiecho pulse sequences of the brain and surrounding structures were obtained without and with intravenous contrast. CONTRAST:  60 cc of Gadavist. COMPARISON:  Prior CT from 07/07/2016 FINDINGS: Brain: Moderately advanced cerebral atrophy with chronic small vessel ischemic disease. Superimposed remote left thalamic lacunar infarct noted. No abnormal foci of restricted diffusion to suggest acute or subacute ischemia. Gray-white matter differentiation maintained. No encephalomalacia to suggest chronic cortical infarction. No foci of susceptibility artifact to suggest acute or chronic intracranial hemorrhage. No mass lesion, midline shift or mass effect. Ventricles normal size without hydrocephalus. No extra-axial fluid collection. No abnormal enhancement. Apparent punctate focus of enhancement within the subcortical white matter of the left frontal lobe seen on coronal post gadolinium sequence image 15 favored to be artifactual or possibly vascular in nature. No other evidence for intracranial metastatic disease. Pituitary gland grossly within normal limits. Vascular: Major intracranial vascular flow voids maintained. Skull and upper cervical spine: Craniocervical junction normal. Multilevel cervical spondylolysis noted within the upper cervical spine without high-grade stenosis. There is abnormal T1 hypointense signal intensity seen involving the right lateral mass of C1 (series 4, image 8), indeterminate, but concerning for  possible osseous metastasis. No other focal marrow replacing lesion identified. Sinuses/Orbits: Globes and orbital soft tissues within normal limits. Right maxillary sinus retention cyst. Paranasal sinuses are otherwise clear. No significant mastoid effusion. Inner ear structures grossly normal. Other: None. IMPRESSION: 1. Abnormal signal intensity involving the right lateral mass of C1, concerning for possible osseous metastasis. 2. No other MRI evidence for intracranial metastatic disease within the brain. 3. No other acute intracranial abnormality. 4. Moderately advanced cerebral atrophy with chronic small vessel ischemic disease. Electronically Signed   By: Jeannine Boga M.D.   On: 05/02/2018 21:39   Mr Thoracic Spine Wo Contrast  Addendum Date: 04/28/2018   ADDENDUM REPORT: 04/28/2018 16:32 ADDENDUM: These results were called by telephone at the time of interpretation on 04/28/2018 at 15:42 to Dr. Myrene Buddy , who verbally acknowledged these results. Electronically Signed   By: Staci Righter M.D.   On: 04/28/2018 16:32   Result Date: 04/28/2018 CLINICAL DATA:  Back pain. EXAM: MRI THORACIC SPINE WITHOUT CONTRAST TECHNIQUE: Multiplanar, multisequence MR imaging of the thoracic spine was  performed. No intravenous contrast was administered. COMPARISON:  Plain films 04/27/2018. FINDINGS: Alignment:  Anatomic Vertebrae: Widespread metastatic disease to the thoracic spine, near complete replacement T1, T6, T7, T9 and T12. Pathologic fracture of T9, loss of approximately 25-33% vertebral body height. Involvement of the pedicles and posterior elements at T6, extending to the spinous process. Partial vertebral body involvement with metastatic disease of T11, T2, and T5. Cord: Significant cord compression at T6, due to bulky dorsal epidural tumor, roughly 16 x 12 x 32 mm cross-section. No definite abnormal cord signal, but the patient is at risk for symptomatic cord compression. Slight posterior  displacement of the posterior wall of T9 with epidural tumor ventrally at this level, which does not result in significant stenosis or impingement. Paraspinal and other soft tissues: BILATERAL pleural effusions, greater on the LEFT. Suspected LEFT hilar mass. Disc levels: Minor disc pathology at multiple levels, not contributory. IMPRESSION: Widespread metastatic disease involving numerous thoracic vertebrae including T1, T2, T5, T6, T7, T9, T11, and T12. Pathologic fracture T9. Bulky dorsal epidural tumor at T6 results in significant cord compression. Neurosurgical consultation may be warranted. BILATERAL pleural effusions, greater on the LEFT with suspected LEFT hilar mass. Recommend CT chest with contrast for further evaluation. Furthermore, given the heterogeneous appearance of the liver on noncontrast CT from 04/01/2018, consider CT abdomen and pelvis with contrast for additional staging. A call has been placed to the ordering provider. Electronically Signed: By: Staci Righter M.D. On: 04/28/2018 15:31   Ct Abdomen Pelvis W Contrast  Result Date: 04/29/2018 CLINICAL DATA:  77 year old female with history of metastatic disease to the spine. Unknown primary neoplasm. EXAM: CT CHEST, ABDOMEN, AND PELVIS WITH CONTRAST TECHNIQUE: Multidetector CT imaging of the chest, abdomen and pelvis was performed following the standard protocol during bolus administration of intravenous contrast. CONTRAST:  154mL OMNIPAQUE IOHEXOL 300 MG/ML  SOLN COMPARISON:  CT the abdomen and pelvis 04/01/2018. No prior chest CT. FINDINGS: CT CHEST FINDINGS Cardiovascular: Heart size is borderline enlarged. There is no significant pericardial fluid, thickening or pericardial calcification. There is aortic atherosclerosis, as well as atherosclerosis of the great vessels of the mediastinum and the coronary arteries, including calcified atherosclerotic plaque in the left main, left anterior descending, left circumflex and right coronary  arteries. Mediastinum/Nodes: Multiple prominent borderline enlarged and enlarged mediastinal and bilateral hilar lymph nodes are noted. The largest of these include 2.3 cm low right paratracheal lymph node, 2.1 cm subcarinal lymph node, 1.9 cm AP window lymph node and a 2.2 cm left hilar lymph node. Esophagus is unremarkable in appearance. No axillary lymphadenopathy. Lungs/Pleura: Small bilateral pleural effusions lying dependently with some associated passive subsegmental atelectasis in the lower lobes of the lungs bilaterally. There is a background of interlobular septal thickening, suggesting underlying pulmonary edema. In the lateral aspect of the left major fissure there is a macrolobulated mass which measures 3.3 x 1.8 x 3.3 cm (axial image 77 of series 6 and sagittal image 136 of series 8) which appears to extend into both the left upper and lower lobes. Musculoskeletal: Innumerable predominantly lytic lesions are noted throughout the visualized axial and appendicular skeleton, compatible with widespread metastatic disease to the bones. There is an associated pathologic compression fracture of superior endplate of T9 with approximately 30% loss of anterior vertebral body height. CT ABDOMEN PELVIS FINDINGS Hepatobiliary: There are several hypovascular hepatic lesions in the liver measuring 3.8 x 2.7 cm in segment 8 (axial image 51 of series 2), 2.8 x 3.4 cm in  segment 4B/5 (axial image 71 of series 2) and 1.5 x 1.5 cm in the central aspect of segment 4A (axial image 59 of series 2). No intra or extrahepatic biliary ductal dilatation. Gallbladder is moderately distended, but otherwise unremarkable in appearance. Pancreas: No pancreatic mass. No pancreatic ductal dilatation. No pancreatic or peripancreatic fluid or inflammatory changes. Spleen: Unremarkable. Adrenals/Urinary Tract: Subcentimeter low-attenuation lesions in the lower pole of the right kidney are too small to definitively characterize, but are  statistically likely to represent tiny cysts. Left kidney and bilateral adrenal glands are normal in appearance. No hydroureteronephrosis. Urinary bladder is normal in appearance. Stomach/Bowel: The appearance of the stomach is normal. There is no pathologic dilatation of small bowel or colon. Numerous colonic diverticulae are noted, particularly in the sigmoid colon, without surrounding inflammatory changes to suggest an acute diverticulitis at this time. The appendix is not confidently identified and may be surgically absent. Regardless, there are no inflammatory changes noted adjacent to the cecum to suggest the presence of an acute appendicitis at this time. Vascular/Lymphatic: Aortic atherosclerosis, without evidence of aneurysm or dissection in the abdominal or pelvic vasculature. No lymphadenopathy noted in the abdomen or pelvis. Reproductive: Uterus and ovaries are unremarkable in appearance. Other: No significant volume of ascites. No pneumoperitoneum. Musculoskeletal: Multiple small lytic lesions scattered throughout the visualized axial and appendicular skeleton, compatible with widespread metastatic disease to the bones. Nondisplaced healing fractures of the right superior and inferior pubic rami. Nondisplaced fractures through both sacral ala. IMPRESSION: 1. 3.3 x 1.8 x 3.3 cm mass in the lateral aspect of the left upper and lower lobes which appears to extend into both the left upper and lower lobes, with associated mediastinal and bilateral hilar lymphadenopathy, as detailed above, highly concerning for primary bronchogenic neoplasm. Further evaluation with PET-CT and/or biopsy is recommended in the near future for diagnostic and staging purposes. 2. 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 3. Heart size is borderline enlarged, and there is evidence of mild interstitial pulmonary edema and small bilateral pleural effusions; imaging findings suggestive of congestive heart  failure. 4. Aortic atherosclerosis, in addition to left main and 3 vessel coronary artery disease. Assessment for potential risk factor modification, dietary therapy or pharmacologic therapy may be warranted, if clinically indicated. 5. Colonic diverticulosis without evidence of acute diverticulitis at this time. 6. Additional incidental findings, as above. Aortic Atherosclerosis (ICD10-I70.0). Electronically Signed   By: Vinnie Langton M.D.   On: 04/29/2018 07:31   US Biopsy (liver)  Result Date: 04/29/2018 INDICATION: 77 year old with chest lymphadenopathy, bone lesions and liver lesions. Findings are suspicious for metastatic disease and tissue diagnosis is needed. EXAM: ULTRASOUND-GUIDED LIVER LESION BIOPSY MEDICATIONS: None. ANESTHESIA/SEDATION: Moderate (conscious) sedation was employed during this procedure. A total of Versed 1 mg and Fentanyl 50 mcg was administered intravenously. Moderate Sedation Time: 15 minutes. The patient's level of consciousness and vital signs were monitored continuously by radiology nursing throughout the procedure under my direct supervision. FLUOROSCOPY TIME:  None COMPLICATIONS: None immediate. PROCEDURE: Informed written consent was obtained from the patient after a thorough discussion of the procedural risks, benefits and alternatives. All questions were addressed. A timeout was performed prior to the initiation of the procedure. Ultrasound was used to evaluate the liver. A very subtle isoechoic lesion was identified in the segment 4B region adjacent to the gallbladder. This appeared to correspond with the recent CT findings. The right abdomen was prepped and draped in sterile fashion. Maximal barrier sterile technique was utilized  including mask, sterile gowns, sterile gloves, sterile drape, hand hygiene and skin antiseptic. Skin was anesthetized with 1% lidocaine. 17 gauge needle directed into the subtle lesion with ultrasound guidance. Four core biopsies obtained with  an 18 gauge device. Specimens placed in formalin. Needle was removed without complication. FINDINGS: Very subtle isoechoic lesion adjacent to the gallbladder. This area did correspond with the recent CT findings. Four small core biopsies were obtained. No significant bleeding or hematoma formation after the core biopsies. IMPRESSION: Ultrasound-guided core biopsies of the subtle hepatic lesion. Visualization of this lesion was very difficult and a false negative biopsy is a possibility. Electronically Signed   By: Markus Daft M.D.   On: 04/29/2018 18:47   [place] Ekg Site Rite  Result Date: 04/06/2018 If Site Rite image not attached, placement could not be confirmed due to current cardiac rhythm.   ASSESSMENT: This is a very pleasant 77 years old white female recently diagnosed with extensive stage (T2 a, N3, M1c) small cell lung cancer diagnosed in November 2019 and presented with left lower lobe lung mass in addition to bilateral hilar and mediastinal lymphadenopathy as well as metastatic disease to bones and liver.   PLAN: I had a lengthy discussion with the patient today about her current disease of stage, prognosis and treatment options. I explained to the patient that she has incurable condition and all the treatment will be of palliative nature. I gave the patient the option of palliative care and hospice referral versus consideration of palliative systemic chemotherapy with carboplatin for AUC of 5 on day 1 and etoposide 100 mg/M2 on days 1, 2 and 3 with Neulasta support as well as Tecentriq (Atezolizumab) 1200 mg IV every 3 weeks. The patient is interested in proceeding with systemic chemotherapy and she is expected to start the care of this treatment in the next few days. I discussed with the patient the adverse effect of this treatment including but not limited to alopecia, myelosuppression, nausea and vomiting, peripheral neuropathy, liver or renal dysfunction in addition to the immunotherapy  mediated adverse effect including skin rash, diarrhea, inflammation of the lung, kidney, liver, thyroid or other endocrine dysfunction. For the metastatic bone disease and epidural tumor extension and questionable cord compression at T6, the patient will continue with the palliative radiotherapy to this area under the care of Dr. Tammi Klippel.  She will also continue with Decadron to decrease the swelling around the tumor. For the hyponatremia, this is secondary to SIADH.  She will continue on fluid restriction in addition to demeclocycline.  Her hyponatremia will not significantly improved until treatment of her small cell lung cancer. The patient may not be discharged home in the next few days because of her significant weakness and fatigue and I may consider starting the first dose of her chemotherapy during her hospitalization. The patient voices understanding of current disease status and treatment options and is in agreement with the current care plan.  All questions were answered. The patient knows to call the clinic with any problems, questions or concerns. We can certainly see the patient much sooner if necessary.  Thank you so much for allowing me to participate in the care of [name] [name]. I will continue to follow up the patient with you and assist in her care.  Disclaimer: This note was dictated with voice recognition software. Similar sounding words can inadvertently be transcribed and may not be corrected upon review.   [name] [name] May 06, 2018, 7:27 AM

## 2018-05-06 NOTE — Progress Notes (Addendum)
PROGRESS NOTE    [name] [name]  QPY:[id number] DOB: 1941/01/22 DOA: 04/25/2018 PCP: Marin Olp, MD   Brief Narrative: Patient is a 77 year old female with past medical history of COPD, diverticulosis, chronic back pain, hyperlipidemia, history of smoking who was initially admitted here for the management of recurrent hyponatremia.  Patient had chronic back pain issues and MRI was done which led to the incidental discovery of widespread metastatic lung cancer.  Patient is status post biopsy.  Assessment & Plan:   Active Problems:   Hyponatremia  Metastatic lung  cancer: Pathology showed metastatic small cell cancer with lung primary.  CT chest/abdomen/pelvis showed a 3 cm lung mass suspicious for bronchogenic carcinoma with hepatic metastasis and widespread bony metastasis.  Oncology and radiation oncology consulted.  MRI of the brain did not show any metastatic lesions.  She is status post ultrasound-guided liver biopsy on 04/29/2018 which showed small cell carcinoma. Oncology has already been consulted.  Planning for restarting chemotherapy here.  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  Continue dexamethasone.  Radiation oncology consulted. PT recommended skilled nursing facility on discharge.  Back pain: Chronic but has recently exacerbated.  Associated with metastasis to spine.  Continue pain management.  Will start on fentanyl patch and long-acting morphine.  Continue bowel regimen with pain management.  Hyponatremia: Initially admitted for hyponatremia.  Sodium level is improved 130.  This is secondary to SIADH.  Continue to demeclocycline and fluid restriction.  Acute metabolic encephalopathy: Resolved  Diastolic CHF: 2D echocardiogram showed ejection function of 50%  with grade 1 diastolic dysfunction.  Not on diuretics  Hypothyroidism: Continue Synthyroid  Recent ESBL UTI: Completed a course of meropenem  Deconditioning/debility: PT has recommended skilled nursing facility placement  Leukocytosis: Most likely secondary to dexamethasone.  We will continue monitor the trend.   DVT prophylaxis:SCD Code Status: Full Family Communication: Discussed with son at the bedside Disposition Plan: Plan for starting on chemotherapy.  Will be discharged to skilled nursing facility when ready   Consultants: Oncology  Procedures: Biopsy  Antimicrobials: None  Subjective: Patient seen and examined the bedside this morning.  She is hemodynamically stable.  Complains of severe back pain on moving.  Denies any chest pain, shortness of breath, nausea or vomiting.  Had a bowel movement yesterday.  Objective: Vitals:   05/05/18 0113 05/05/18 0650 05/05/18 1317 05/06/18 0539  BP: (!) 153/105 137/89 94/66 (!) 146/86  Pulse: 74 74 84 84  Resp:  16 16 18   Temp:  97.8 F (36.6 C) 98.4 F (36.9 C) 97.6 F (36.4 C)  TempSrc:  Oral Oral Oral  SpO2: 97% 97% 95% 97%  Weight:  68 kg    Height:        Intake/Output Summary (Last 24 hours) at 05/06/2018 0941 Last data filed at 05/06/2018 1245 Gross per 24 hour  Intake 360 ml  Output 500 ml  Net -140 ml   Filed Weights   05/01/18 0605 05/03/18 0500 05/05/18 0650  Weight: 65.9 kg 67.4 kg 68 kg    Examination:  General exam: Not in distress,average built HEENT:PERRL,Oral mucosa moist, Ear/Nose normal on gross exam Respiratory system: Bilateral equal air entry, normal vesicular breath sounds, no wheezes or crackles  Cardiovascular system: S1 & S2 heard, RRR. No JVD, murmurs, rubs, gallops or clicks. No pedal edema. Gastrointestinal system: Abdomen  is nondistended, soft and nontender. No organomegaly or masses felt. Normal bowel sounds heard. Central nervous system: Alert and oriented. No focal neurological  deficits. Extremities: No edema, no clubbing ,no cyanosis, distal peripheral pulses palpable. Skin: No rashes, lesions or ulcers,no icterus ,no pallor MSK: Normal muscle bulk,tone ,power.back tenderness Psychiatry: Judgement and insight appear normal. Mood & affect appropriate.     Data Reviewed: I have personally reviewed following labs and imaging studies  CBC: Recent Labs  Lab 05/01/18 0500 05/02/18 0500 05/03/18 0534 05/04/18 0447 05/05/18 0523  WBC 10.1 12.4* 13.5* 10.4 19.6*  HGB 10.4* 10.3* 10.9* 10.4* 10.9*  HCT 32.0* 32.3* 33.8* 32.4* 33.8*  MCV 86.0 89.7 89.2 86.6 86.9  PLT 417* 381 391 414* 287*   Basic Metabolic Panel: Recent Labs  Lab 05/01/18 0500 05/02/18 0500 05/03/18 0534 05/04/18 0447 05/05/18 0523  NA 129* 133* 128* 128* 130*  K 3.8 4.6 4.7 4.7 5.0  CL 94* 97* 93* 96* 95*  CO2 25 27 27 24 28   GLUCOSE 180* 124* 105* 122* 114*  BUN 31* 31* 25* 33* 35*  CREATININE 0.80 0.79 0.72 0.68 0.65  CALCIUM 8.8* 9.1 9.1 8.9 8.7*   GFR: Estimated Creatinine Clearance: 55.5 mL/min (by C-G formula based on SCr of 0.65 mg/dL). Liver Function Tests: No results for input(s): AST, ALT, ALKPHOS, BILITOT, PROT, ALBUMIN in the last 168 hours. No results for input(s): LIPASE, AMYLASE in the last 168 hours. No results for input(s): AMMONIA in the last 168 hours. Coagulation Profile: Recent Labs  Lab 04/29/18 0947  INR 0.95   Cardiac Enzymes: No results for input(s): CKTOTAL, CKMB, CKMBINDEX, TROPONINI in the last 168 hours. BNP (last 3 results) No results for input(s): PROBNP in the last 8760 hours. HbA1C: No results for input(s): HGBA1C in the last 72 hours. CBG: No results for input(s): GLUCAP in the last 168 hours. Lipid Profile: No results for input(s): CHOL, HDL, LDLCALC, TRIG, CHOLHDL, LDLDIRECT in the last 72 hours. Thyroid Function Tests: No results for input(s): TSH, T4TOTAL, FREET4, T3FREE, THYROIDAB in the last 72 hours. Anemia Panel: No results  for input(s): VITAMINB12, FOLATE, FERRITIN, TIBC, IRON, RETICCTPCT in the last 72 hours. Sepsis Labs: No results for input(s): PROCALCITON, LATICACIDVEN in the last 168 hours.  No results found for this or any previous visit (from the past 240 hour(s)).       Radiology Studies: No results found.      Scheduled Meds: . demeclocycline  300 mg Oral Q12H  . dexamethasone  4 mg Oral Q8H  . feeding supplement (ENSURE ENLIVE)  237 mL Oral BID BM  . fentaNYL  25 mcg Transdermal Q72H  . levothyroxine  75 mcg Oral QAC breakfast  . morphine  15 mg Oral Q12H  . polyethylene glycol  17 g Oral Daily  . senna  1 tablet Oral Daily  . sodium chloride flush  10 mL Intravenous Q12H  . sodium chloride  1 g Oral TID WC   Continuous Infusions:   LOS: 10 days    Time spent:35 mins. More than 50% of that time was spent in counseling and/or coordination of care.      Shelly Coss, MD [facility] Pager [phone]  If 7PM-7AM, please contact night-coverage [email or web address] Password [email or web address] 05/06/2018, 9:41 AM

## 2018-05-07 ENCOUNTER — Ambulatory Visit
Admit: 2018-05-07 | Discharge: 2018-05-07 | Disposition: A | Discharge status: Home / Self Care | Payer: Medicare Other | Attending: Radiation Oncology | Admitting: Radiation Oncology

## 2018-05-07 LAB — CBC WITH DIFFERENTIAL/PLATELET
ABS IMMATURE GRANULOCYTES: 0.29 10*3/uL — AB (ref 0.00–0.07)
BASOS PCT: 0 %
Basophils Absolute: 0 10*3/uL (ref 0.0–0.1)
Eosinophils Absolute: 0 10*3/uL (ref 0.0–0.5)
Eosinophils Relative: 0 %
HCT: 33.6 % — ABNORMAL LOW (ref 36.0–46.0)
Hemoglobin: 10.6 g/dL — ABNORMAL LOW (ref 12.0–15.0)
IMMATURE GRANULOCYTES: 1 %
Lymphocytes Relative: 3 %
Lymphs Abs: 0.6 10*3/uL — ABNORMAL LOW (ref 0.7–4.0)
MCH: 28.4 pg (ref 26.0–34.0)
MCHC: 31.5 g/dL (ref 30.0–36.0)
MCV: 90.1 fL (ref 80.0–100.0)
MONOS PCT: 5 %
Monocytes Absolute: 1 10*3/uL (ref 0.1–1.0)
NEUTROS ABS: 19.8 10*3/uL — AB (ref 1.7–7.7)
NEUTROS PCT: 91 %
Platelets: 385 10*3/uL (ref 150–400)
RBC: 3.73 MIL/uL — ABNORMAL LOW (ref 3.87–5.11)
RDW: 15.3 % (ref 11.5–15.5)
WBC: 21.7 10*3/uL — ABNORMAL HIGH (ref 4.0–10.5)
nRBC: 0 % (ref 0.0–0.2)

## 2018-05-07 LAB — BASIC METABOLIC PANEL
ANION GAP: 6 (ref 5–15)
BUN: 38 mg/dL — ABNORMAL HIGH (ref 8–23)
CO2: 28 mmol/L (ref 22–32)
Calcium: 8.8 mg/dL — ABNORMAL LOW (ref 8.9–10.3)
Chloride: 97 mmol/L — ABNORMAL LOW (ref 98–111)
Creatinine, Ser: 0.78 mg/dL (ref 0.44–1.00)
Glucose, Bld: 119 mg/dL — ABNORMAL HIGH (ref 70–99)
Potassium: 4.9 mmol/L (ref 3.5–5.1)
SODIUM: 131 mmol/L — AB (ref 135–145)

## 2018-05-07 MED ORDER — SODIUM CHLORIDE 0.9 % IV SOLN
100.0000 mg/m2 | Freq: Once | INTRAVENOUS | Status: AC
  Administered 2018-05-07: 180 mg via INTRAVENOUS
  Filled 2018-05-07: qty 9

## 2018-05-07 MED ORDER — SODIUM CHLORIDE 0.9 % IV SOLN
Freq: Once | INTRAVENOUS | Status: AC
  Administered 2018-05-07: 13:00:00 via INTRAVENOUS

## 2018-05-07 MED ORDER — SODIUM CHLORIDE 0.9 % IV SOLN
370.0000 mg | Freq: Once | INTRAVENOUS | Status: AC
  Administered 2018-05-07: 370 mg via INTRAVENOUS
  Filled 2018-05-07: qty 37

## 2018-05-07 MED ORDER — PALONOSETRON HCL INJECTION 0.25 MG/5ML
0.2500 mg | Freq: Once | INTRAVENOUS | Status: AC
  Administered 2018-05-07: 0.25 mg via INTRAVENOUS
  Filled 2018-05-07: qty 5

## 2018-05-07 MED ORDER — SENNA 8.6 MG PO TABS
2.0000 | ORAL_TABLET | Freq: Every day | ORAL | Status: DC
  Administered 2018-05-08 – 2018-05-11 (×4): 17.2 mg via ORAL
  Filled 2018-05-07 (×4): qty 2

## 2018-05-07 MED ORDER — SODIUM CHLORIDE 0.9 % IV SOLN
Freq: Once | INTRAVENOUS | Status: AC
  Administered 2018-05-07: 13:00:00 via INTRAVENOUS
  Filled 2018-05-07: qty 5

## 2018-05-07 NOTE — Progress Notes (Signed)
Reviewed chemotherapy information with patient and her two sons using teach back method.  Provided handouts for Carboplatin and Etoposide.  Also provided education notebook with reference materials.  All questions answered.  Consent form signed and placed in shadow chart.  Zandra Abts [facility]  05/07/2018 11:55 AM

## 2018-05-07 NOTE — Progress Notes (Addendum)
PROGRESS NOTE    [name] [name]  AJO:[id number] DOB: 11-03-1940 DOA: 04/25/2018 PCP: Marin Olp, MD   Brief Narrative: Patient is a 77 year old female with past medical history of COPD, diverticulosis, chronic back pain, hyperlipidemia, history of smoking who was initially admitted here for the management of recurrent hyponatremia.  Patient had chronic back pain issues and MRI was done which led to the incidental discovery of widespread metastatic lung cancer.  Patient is status post biopsy. Oncology is following her and the plan is to start on chemotherapy.  Assessment & Plan:   Active Problems:   Hyponatremia  Metastatic lung  cancer: Pathology showed metastatic small cell cancer with lung primary.  CT chest/abdomen/pelvis showed a 3 cm lung mass suspicious for bronchogenic carcinoma with hepatic metastasis and widespread bony metastasis.  Oncology and radiation oncology consulted.  MRI of the brain did not show any metastatic lesions.  She is status post ultrasound-guided liver biopsy on 04/29/2018 which showed small cell carcinoma. Oncology has already been consulted.  Planning for starting chemotherapy here.  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  Continue dexamethasone.  Radiation oncology consulted. PT recommended skilled nursing facility on discharge. SW consulted.  Back pain: Chronic but has recently exacerbated.  Associated with metastasis to spine.  Continue pain management. Started on fentanyl patch and long-acting morphine.  Continue bowel regimen with pain management.  Hyponatremia: Initially admitted for hyponatremia.  Sodium level is improved 131.  This is secondary to SIADH.  Continue to demeclocycline and fluid restriction of 2 L a day.Also on salt  tablets.  Acute metabolic encephalopathy: Resolved  Diastolic CHF: 2D echocardiogram showed ejection function of 50% with grade 1 diastolic dysfunction.  Not on diuretics  Hypothyroidism: Continue Synthyroid  Recent ESBL UTI: Completed a course of meropenem  Deconditioning/debility: PT has recommended skilled nursing facility placement.SW consulted  Leukocytosis: Most likely secondary to dexamethasone.  We will continue monitor the trend.   DVT prophylaxis:SCD Code Status: Full Family Communication: Discussed with son at the bedside Disposition Plan: Plan for starting on chemotherapy.  Will be discharged to skilled nursing facility when ready   Consultants: Oncology  Procedures: Biopsy  Antimicrobials: None  Subjective: Patient seen and examined the bedside this morning.  She is hemodynamically stable.  Back pain has significantly improved today.  She looked very comfortable this morning and pleasant .She denies any chest pain, shortness of breath, nausea or vomiting.   Objective: Vitals:   05/05/18 1317 05/06/18 0539 05/06/18 2129 05/07/18 0611  BP: 94/66 (!) 146/86 106/72 122/77  Pulse: 84 84 88 73  Resp: 16 18 17 18   Temp: 98.4 F (36.9 C) 97.6 F (36.4 C) 97.7 F (36.5 C) (!) 97.5 F (36.4 C)  TempSrc: Oral Oral Oral Oral  SpO2: 95% 97% 93% 95%  Weight:      Height:        Intake/Output Summary (Last 24 hours) at 05/07/2018 1019 Last data filed at 05/07/2018 0455 Gross per 24 hour  Intake 240 ml  Output 1000 ml  Net -760 ml   Filed Weights   05/01/18 0605 05/03/18 0500 05/05/18 0650  Weight: 65.9 kg 67.4 kg 68 kg    Examination:  General exam: Appears calm and comfortable ,Not in distress,average built, pleasant elderly female HEENT:PERRL,Oral mucosa moist, Ear/Nose normal on gross exam Respiratory system:  Bilateral equal air entry, normal vesicular breath sounds, no wheezes or crackles  Cardiovascular system: S1 & S2 heard, RRR. No JVD, murmurs,  rubs, gallops or clicks. Gastrointestinal system: Abdomen is mildly distended, soft and nontender. No organomegaly or masses felt. Normal bowel sounds heard. Central nervous system: Alert and oriented. No focal neurological deficits. Extremities: No edema, no clubbing ,no cyanosis, distal peripheral pulses palpable. Skin: No rashes, lesions or ulcers,no icterus ,no pallor MSK: Normal muscle bulk,tone ,power Psychiatry: Judgement and insight appear normal. Mood & affect appropriate.      Data Reviewed: I have personally reviewed following labs and imaging studies  CBC: Recent Labs  Lab 05/02/18 0500 05/03/18 0534 05/04/18 0447 05/05/18 0523 05/07/18 0550  WBC 12.4* 13.5* 10.4 19.6* 21.7*  NEUTROABS  --   --   --   --  19.8*  HGB 10.3* 10.9* 10.4* 10.9* 10.6*  HCT 32.3* 33.8* 32.4* 33.8* 33.6*  MCV 89.7 89.2 86.6 86.9 90.1  PLT 381 391 414* 443* 322   Basic Metabolic Panel: Recent Labs  Lab 05/02/18 0500 05/03/18 0534 05/04/18 0447 05/05/18 0523 05/07/18 0550  NA 133* 128* 128* 130* 131*  K 4.6 4.7 4.7 5.0 4.9  CL 97* 93* 96* 95* 97*  CO2 27 27 24 28 28   GLUCOSE 124* 105* 122* 114* 119*  BUN 31* 25* 33* 35* 38*  CREATININE 0.79 0.72 0.68 0.65 0.78  CALCIUM 9.1 9.1 8.9 8.7* 8.8*   GFR: Estimated Creatinine Clearance: 55.5 mL/min (by C-G formula based on SCr of 0.78 mg/dL). Liver Function Tests: No results for input(s): AST, ALT, ALKPHOS, BILITOT, PROT, ALBUMIN in the last 168 hours. No results for input(s): LIPASE, AMYLASE in the last 168 hours. No results for input(s): AMMONIA in the last 168 hours. Coagulation Profile: No results for input(s): INR, PROTIME in the last 168 hours. Cardiac Enzymes: No results for input(s): CKTOTAL, CKMB, CKMBINDEX, TROPONINI in the last 168 hours. BNP (last 3 results) No results for input(s): PROBNP in the last 8760 hours. HbA1C: No results for input(s): HGBA1C in the last 72 hours. CBG: No results for input(s): GLUCAP in the  last 168 hours. Lipid Profile: No results for input(s): CHOL, HDL, LDLCALC, TRIG, CHOLHDL, LDLDIRECT in the last 72 hours. Thyroid Function Tests: No results for input(s): TSH, T4TOTAL, FREET4, T3FREE, THYROIDAB in the last 72 hours. Anemia Panel: No results for input(s): VITAMINB12, FOLATE, FERRITIN, TIBC, IRON, RETICCTPCT in the last 72 hours. Sepsis Labs: No results for input(s): PROCALCITON, LATICACIDVEN in the last 168 hours.  No results found for this or any previous visit (from the past 240 hour(s)).       Radiology Studies: No results found.      Scheduled Meds: . CARBOplatin  370 mg Intravenous Once  . demeclocycline  300 mg Oral Q12H  . dexamethasone  4 mg Oral Q8H  . etoposide  100 mg/m2 (Treatment Plan Recorded) Intravenous Once  . feeding supplement (ENSURE ENLIVE)  237 mL Oral BID BM  . fentaNYL  25 mcg Transdermal Q72H  . levothyroxine  75 mcg Oral QAC breakfast  . morphine  15 mg Oral Q12H  . palonosetron  0.25 mg Intravenous Once  . polyethylene glycol  17 g Oral Daily  . senna  1 tablet Oral Daily  . sodium chloride flush  10 mL Intravenous Q12H  . sodium chloride  1 g Oral TID WC   Continuous Infusions: . sodium chloride    . fosaprepitant (EMEND) 150 mg + dexamethasone IV  infusion       LOS: 11 days    Time spent:35 mins. More than 50% of that time was spent in counseling and/or coordination of care.      Shelly Coss, MD [facility] Pager [phone]  If 7PM-7AM, please contact night-coverage [email or web address] Password [email or web address] 05/07/2018, 10:19 AM

## 2018-05-07 NOTE — Progress Notes (Signed)
CSW consulted to assist with disposition - pt known to CSW from previous admission last month after which she was admitted to [facility] for rehab (See assessment below for details from that encounter).  Pt admitted now with metastatic lung cancer, beginning palliative chemotherapy today. Remaining cycles planning to be done at cancer center. Also receiving palliative radiation until 05/15/18.  Discussed with pt's son this morning- states pt did regain some ability to ambulate while at SNF last month however in last 1wk+ since admission has markedly declined and feels they would like her to admit to snf again to see if her care needs during chemotherapy can be better managed than at home. Want to have her participate in rehab but also reasonable re: the barrier that her advancing illness will have on therapy participation.  CSW made referrals to area facilities and will follow up with bed offers. Family understanding that [place] [employer] will need to approve readmission to SNF for any medicare coverage.  Sharren Bridge, MSW, LCSW Clinical Social Work 05/07/2018 [phone]     Clinical Social Work Assessment  Patient Details  Name: [name] [name] MRN: [id number] Date of Birth: 1941/04/20  Date of referral:  03/24/18               Reason for consult:  Facility Placement                 Permission sought to share information with:  Facility Art therapist granted to share information::  Yes, Verbal Permission Granted             Name::     [place]::  SNF             Relationship::  Spouse             Contact Information:  [phone]  Housing/Transportation Living arrangements for the past 2 months:  [place] of Information:  Patient, Spouse Patient Interpreter Needed:  None Criminal Activity/Legal Involvement Pertinent to Current Situation/Hospitalization:  No - Comment as needed Significant Relationships:   Spouse Lives with:  Spouse Do you feel safe going back to the place where you live?  Yes Need for family participation in patient care:  No (Coment)  Care giving concerns:  Patient lives at home with spouse and will need short term therapy to regain strength before returning home.   Social Worker assessment / plan:  CSW met with patient and spouse, Mel, at bedside to discuss discharge plans. Patient lives with spouse at home in [place]. She has a history of COPD, ongoing tobacco use, hypothyroidism with incomplete adherence to synthroid, and recent pubic ramus fracture per her chart.  Patient is open and agreeable to SNF placement. CSW explained process of referrals and insurance authorization. Patient will need BCBS auth before discharge.   CSW will complete FL2, send out referrals, and coordinate discharge.  Employment status:  Retired Nurse, adult PT Recommendations:  [place] / Referral to community resources:  [place]  Patient/Family's Response to care:  Patient was excited to report she has made progress with her pain and movement since being in the hospital. She is looking forward to going to rehab to get stronger.  Patient/Family's Understanding of and Emotional Response to Diagnosis, Current Treatment, and Prognosis:  Patient and spouse understand discharge process and SNF referral process.  Emotional Assessment Appearance:  Appears stated age Attitude/Demeanor/Rapport:  Engaged Affect (typically observed):  Appropriate, Pleasant Orientation:  Oriented to Self, Oriented to Place, Oriented to  Time, Oriented to Situation Alcohol / Substance use:  Not Applicable Psych involvement (Current and /or in the community):  No (Comment)  Discharge Needs  Concerns to be addressed:  Care Coordination Readmission within the last 30 days:  No Current discharge risk:  Physical Impairment Barriers to  Discharge:  [place], [place] 03/26/2018, 11:31 AM

## 2018-05-07 NOTE — NC FL2 (Signed)
[place] LEVEL OF CARE SCREENING TOOL     IDENTIFICATION  Patient Name: [name] [name] Birthdate: 1940/11/08 Sex: female Admission Date (Current Location): 04/25/2018  [facility] and [place] Number:  Herbalist and Address:  [facility],  [place] [address] [place]      Provider Number: [id number]  Attending Physician Name and Address:  Shelly Coss, MD  Relative Name and Phone Number:       Current Level of Care: Hospital Recommended Level of Care: [place] Prior Approval Number:    Date Approved/Denied:   PASRR Number: [id number]  Discharge Plan: SNF    Current Diagnoses: Patient Active Problem List   Diagnosis Date Noted  . Small cell carcinoma of lower lobe of left lung ([place]) 05/06/2018  . Spinal cord compression due to malignant neoplasm metastatic to spine ([place]) 04/30/2018  . Presumed cancer of left upper lobe of lung pending biopsy ([place]) 04/30/2018  . Carbapenem-resistant bacterial infection 04/25/2018  . Liver mass 04/10/2018  . Aortic atherosclerosis ([place]) 04/10/2018  . Elevated BP without diagnosis of hypertension 04/05/2018  . Acute lower UTI 04/01/2018  . Constipation 04/01/2018  . Hyponatremia 03/23/2018  . Debility 03/23/2018  . Pubic ramus fracture ([place]) 03/23/2018  . Primary osteoarthritis of right hip 02/07/2018  . Belching 02/01/2018  . Acquired trigger finger of right middle finger 07/20/2017  . Varicose veins 12/02/2013  . OA (osteoarthritis) 08/15/2012  . Hearing loss 08/16/2009  . Hypothyroidism 10/12/2007  . CIGARETTE SMOKER 10/09/2007  . HYPERCHOLESTEROLEMIA 10/08/2007  . COPD (chronic obstructive pulmonary disease) ([place]) 10/08/2007  . BACK PAIN, LUMBAR 10/08/2007    Orientation RESPIRATION BLADDER Height & Weight     Self, Place, Situation  Normal Continent Weight: 149 lb 14.6 oz (68 kg) Height:  5\' 7"  (170.2 cm)  BEHAVIORAL SYMPTOMS/MOOD NEUROLOGICAL BOWEL NUTRITION  STATUS      Incontinent Diet(regular diet, fluid restriction 153mL)  AMBULATORY STATUS COMMUNICATION OF NEEDS Skin   Extensive Assist   Normal                       Personal Care Assistance Level of Assistance  Bathing, Feeding, Dressing Bathing Assistance: Maximum assistance Feeding assistance: Independent Dressing Assistance: Maximum assistance     Functional Limitations Info  Sight, Hearing, Speech Sight Info: Adequate Hearing Info: Impaired Speech Info: Adequate    SPECIAL CARE FACTORS FREQUENCY  PT (By licensed PT), OT (By licensed OT)     PT Frequency: 3x OT Frequency: 3x            Contractures Contractures Info: Not present    Additional Factors Info  Code Status, Allergies Code Status Info: full code Allergies Info: Atorvastatin, Cyclobenzaprine, Ezetimibe, Rosuvastatin, Gadolinium Derivatives, Nicoderm Nicotine           Current Medications (05/07/2018):  This is the current hospital active medication list Current Facility-Administered Medications  Medication Dose Route Frequency Provider Last Rate Last Dose  . ALPRAZolam Duanne Moron) tablet 0.5 mg  0.5 mg Oral BID PRN Edwin Dada, MD   0.5 mg at 05/06/18 0847  . CARBOplatin (PARAPLATIN) 370 mg in sodium chloride 0.9 % 250 mL chemo infusion  370 mg Intravenous Once Curt Bears, MD      . demeclocycline (DECLOMYCIN) tablet 300 mg  300 mg Oral Q12H Danford, Suann Larry, MD   300 mg at 05/07/18 1127  . dexamethasone (DECADRON) tablet 4 mg  4 mg Oral Q8H Danford, Christopher  P, MD   4 mg at 05/07/18 0545  . etoposide (VEPESID) 180 mg in sodium chloride 0.9 % 500 mL chemo infusion  100 mg/m2 (Treatment Plan Recorded) Intravenous Once Curt Bears, MD      . feeding supplement (ENSURE ENLIVE) (ENSURE ENLIVE) liquid 237 mL  237 mL Oral BID BM Danford, Suann Larry, MD   237 mL at 05/06/18 1056  . fentaNYL (DURAGESIC - dosed mcg/hr) patch 25 mcg  25 mcg Transdermal Q72H Shelly Coss, MD    25 mcg at 05/06/18 1053  . HYDROcodone-acetaminophen (NORCO) 10-325 MG per tablet 1 tablet  1 tablet Oral Q6H PRN Shelly Coss, MD   1 tablet at 05/06/18 1608  . levothyroxine (SYNTHROID, LEVOTHROID) tablet 75 mcg  75 mcg Oral QAC breakfast Shela Leff, MD   75 mcg at 05/07/18 1128  . morphine (MS CONTIN) 12 hr tablet 15 mg  15 mg Oral Q12H Adhikari, Amrit, MD   15 mg at 05/07/18 1126  . naphazoline-pheniramine (NAPHCON-A) 0.025-0.3 % ophthalmic solution 1 drop  1 drop Both Eyes QID PRN Edwin Dada, MD   1 drop at 05/03/18 2234  . ondansetron (ZOFRAN) injection 4 mg  4 mg Intravenous Q6H PRN Pokhrel, Laxman, MD   4 mg at 05/04/18 0825  . polyethylene glycol (MIRALAX / GLYCOLAX) packet 17 g  17 g Oral Daily Pokhrel, Laxman, MD   17 g at 05/07/18 1128  . [START ON 05/08/2018] senna (SENOKOT) tablet 17.2 mg  2 tablet Oral Daily Adhikari, Amrit, MD      . sodium chloride flush (NS) 0.9 % injection 10 mL  10 mL Intravenous Q12H Danford, Suann Larry, MD   10 mL at 05/06/18 2100  . sodium chloride tablet 1 g  1 g Oral TID WC Pokhrel, Laxman, MD   1 g at 05/07/18 1127     Discharge Medications: Please see discharge summary for a list of discharge medications.  Relevant Imaging Results:  Relevant Lab Results:   Additional Information SSN: [id number]. Pt receiving OP chemotherapy at [facility]. Will provide schedule to facility   Nila Nephew, LCSW

## 2018-05-07 NOTE — Progress Notes (Signed)
Chemo orders and calculations checked with Aldean Baker. RN BSN

## 2018-05-08 ENCOUNTER — Ambulatory Visit
Admit: 2018-05-08 | Discharge: 2018-05-08 | Disposition: A | Discharge status: Home / Self Care | Payer: Medicare Other | Attending: Radiation Oncology | Admitting: Radiation Oncology

## 2018-05-08 ENCOUNTER — Encounter: Payer: Self-pay | Admitting: *Deleted

## 2018-05-08 ENCOUNTER — Telehealth: Payer: Self-pay | Admitting: Radiation Oncology

## 2018-05-08 LAB — BASIC METABOLIC PANEL
Anion gap: 7 (ref 5–15)
BUN: 38 mg/dL — AB (ref 8–23)
CALCIUM: 9 mg/dL (ref 8.9–10.3)
CO2: 25 mmol/L (ref 22–32)
CREATININE: 0.61 mg/dL (ref 0.44–1.00)
Chloride: 98 mmol/L (ref 98–111)
GFR calc Af Amer: >60 mL/min (ref >60)
GFR calc non Af Amer: >60 mL/min (ref >60)
Glucose, Bld: 116 mg/dL — ABNORMAL HIGH (ref 70–99)
Potassium: 5.1 mmol/L (ref 3.5–5.1)
Sodium: 130 mmol/L — ABNORMAL LOW (ref 135–145)

## 2018-05-08 LAB — CBC WITH DIFFERENTIAL/PLATELET
ABS IMMATURE GRANULOCYTES: 0.28 10*3/uL — AB (ref 0.00–0.07)
BASOS ABS: 0 10*3/uL (ref 0.0–0.1)
Basophils Relative: 0 %
Eosinophils Absolute: 0 10*3/uL (ref 0.0–0.5)
Eosinophils Relative: 0 %
HCT: 33.9 % — ABNORMAL LOW (ref 36.0–46.0)
HEMOGLOBIN: 10.7 g/dL — AB (ref 12.0–15.0)
IMMATURE GRANULOCYTES: 1 %
LYMPHS ABS: 0.3 10*3/uL — AB (ref 0.7–4.0)
LYMPHS PCT: 1 %
MCH: 27.8 pg (ref 26.0–34.0)
MCHC: 31.6 g/dL (ref 30.0–36.0)
MCV: 88.1 fL (ref 80.0–100.0)
Monocytes Absolute: 0.8 10*3/uL (ref 0.1–1.0)
Monocytes Relative: 4 %
NEUTROS ABS: 18.1 10*3/uL — AB (ref 1.7–7.7)
NEUTROS PCT: 94 %
NRBC: 0 % (ref 0.0–0.2)
Platelets: 426 10*3/uL — ABNORMAL HIGH (ref 150–400)
RBC: 3.85 MIL/uL — AB (ref 3.87–5.11)
RDW: 15.3 % (ref 11.5–15.5)
WBC: 19.4 10*3/uL — ABNORMAL HIGH (ref 4.0–10.5)

## 2018-05-08 MED ORDER — SODIUM CHLORIDE 0.9 % IV SOLN
100.0000 mg/m2 | Freq: Once | INTRAVENOUS | Status: AC
  Administered 2018-05-08: 180 mg via INTRAVENOUS
  Filled 2018-05-08: qty 9

## 2018-05-08 MED ORDER — SODIUM CHLORIDE 0.9 % IV SOLN
Freq: Once | INTRAVENOUS | Status: AC
  Administered 2018-05-08: 15:00:00 via INTRAVENOUS

## 2018-05-08 MED ORDER — SODIUM CHLORIDE 0.9 % IV SOLN
10.0000 mg | Freq: Once | INTRAVENOUS | Status: AC
  Administered 2018-05-08: 10 mg via INTRAVENOUS
  Filled 2018-05-08: qty 1

## 2018-05-08 NOTE — Telephone Encounter (Signed)
I spoke with the patient's son to review her course. She continues to have loss of motor and sensory function of her extremities and ability to control her urinary and bowel habits. We recommend continuing XRT and dexamethasone with the hopes that she may regain these abilities. It is difficult to anticipate her recovery at this time, she's completed almost 1/2 of her treatment but it could take weeks or longer to determine her recovery.     Carola Rhine, PAC

## 2018-05-08 NOTE — Progress Notes (Signed)
PROGRESS NOTE    [name] [name]  BZJ:[id number] DOB: [birth year] DOA: 04/25/2018 PCP: Marin Olp, MD   Brief Narrative: Patient is a 77 year old female with past medical history of COPD, diverticulosis, chronic back pain, hyperlipidemia, history of smoking who was initially admitted here for the management of recurrent hyponatremia.  Patient had chronic back pain issues and MRI was done which led to the incidental discovery of widespread metastatic lung cancer.  Patient is status post biopsy. Oncology is following her and she has been started  on chemotherapy here on 05/07/18.  Assessment & Plan:   Active Problems:   Hyponatremia  Metastatic lung  cancer: Pathology showed metastatic small cell cancer with lung primary.  CT chest/abdomen/pelvis showed a 3 cm lung mass suspicious for bronchogenic carcinoma with hepatic metastasis and widespread bony metastasis.  Oncology and radiation oncology consulted.  MRI of the brain did not show any metastatic lesions.  She is status post ultrasound-guided liver biopsy on 04/29/2018 which showed small cell carcinoma. Oncology has already been consulted.  Started on chemotherapy here.  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  Continue dexamethasone.  Radiation oncology consulted. PT recommended skilled nursing facility on discharge. SW consulted.  Back pain: Chronic but has recently exacerbated.  Associated with metastasis to spine.  Continue pain management. Started on fentanyl patch and long-acting morphine.  Continue bowel regimen with pain management.  Hyponatremia: Initially admitted for hyponatremia.  Sodium level is improved 131.  This is secondary to SIADH.  Continue to demeclocycline and fluid restriction of 2 L a day.Also on salt  tablets.  Acute metabolic encephalopathy: Resolved  Diastolic CHF: 2D echocardiogram showed ejection function of 50% with grade 1 diastolic dysfunction.  Not on diuretics  Hypothyroidism: Continue Synthyroid  Recent ESBL UTI: Completed a course of meropenem  Deconditioning/debility: PT has recommended skilled nursing facility placement.SW consulted  Leukocytosis: Most likely secondary to dexamethasone.  We will continue monitor the trend.   DVT prophylaxis:SCD Code Status: Full Family Communication: Discussed with son at the bedside Disposition Plan: Started on chemotherapy.  Will be discharged to skilled nursing facility when she finishes this first cycle of chemo   Consultants: Oncology  Procedures: Biopsy  Antimicrobials: None  Subjective: Patient seen and examined the bedside this morning.  She is hemodynamically stable.  Back pain has significantly improved today.  She looked very comfortable this morning .She denies any chest pain, shortness of breath, nausea or vomiting. She was concerned  about the water restriction.  Objective: Vitals:   05/07/18 2346 05/08/18 0546 05/08/18 1051 05/08/18 1057  BP: 103/68 122/80 102/69 104/69  Pulse: 69 75 77   Resp: 16 16 18    Temp: 97.9 F (36.6 C) 97.7 F (36.5 C) 98.1 F (36.7 C)   TempSrc: Oral Oral Oral   SpO2: 94% 96% 94%   Weight:      Height:        Intake/Output Summary (Last 24 hours) at 05/08/2018 1125 Last data filed at 05/08/2018 1121 Gross per 24 hour  Intake 730 ml  Output -  Net 730 ml   Filed Weights   05/01/18 0605 05/03/18 0500 05/05/18 0650  Weight: 65.9 kg 67.4 kg 68 kg    Examination:  General exam: Appears calm and comfortable ,Not in distress,average built, pleasant elderly female HEENT:PERRL,Oral mucosa moist, Ear/Nose normal  on gross exam Respiratory system: Bilateral equal air entry, normal vesicular breath sounds, no wheezes or crackles  Cardiovascular system: S1 & S2 heard, RRR. No  JVD, murmurs, rubs, gallops or clicks. Gastrointestinal system: Abdomen is mildly distended, soft and nontender. No organomegaly or masses felt. Normal bowel sounds heard. Central nervous system: Alert and oriented. No focal neurological deficits. Extremities: No edema, no clubbing ,no cyanosis, distal peripheral pulses palpable. Skin: No rashes, lesions or ulcers,no icterus ,no pallor MSK: Normal muscle bulk,tone ,power Psychiatry: Judgement and insight appear normal. Mood & affect appropriate.      Data Reviewed: I have personally reviewed following labs and imaging studies  CBC: Recent Labs  Lab 05/03/18 0534 05/04/18 0447 05/05/18 0523 05/07/18 0550 05/08/18 0549  WBC 13.5* 10.4 19.6* 21.7* 19.4*  NEUTROABS  --   --   --  19.8* 18.1*  HGB 10.9* 10.4* 10.9* 10.6* 10.7*  HCT 33.8* 32.4* 33.8* 33.6* 33.9*  MCV 89.2 86.6 86.9 90.1 88.1  PLT 391 414* 443* 385 619*   Basic Metabolic Panel: Recent Labs  Lab 05/03/18 0534 05/04/18 0447 05/05/18 0523 05/07/18 0550 05/08/18 0549  NA 128* 128* 130* 131* 130*  K 4.7 4.7 5.0 4.9 5.1  CL 93* 96* 95* 97* 98  CO2 27 24 28 28 25   GLUCOSE 105* 122* 114* 119* 116*  BUN 25* 33* 35* 38* 38*  CREATININE 0.72 0.68 0.65 0.78 0.61  CALCIUM 9.1 8.9 8.7* 8.8* 9.0   GFR: Estimated Creatinine Clearance: 55.5 mL/min (by C-G formula based on SCr of 0.61 mg/dL). Liver Function Tests: No results for input(s): AST, ALT, ALKPHOS, BILITOT, PROT, ALBUMIN in the last 168 hours. No results for input(s): LIPASE, AMYLASE in the last 168 hours. No results for input(s): AMMONIA in the last 168 hours. Coagulation Profile: No results for input(s): INR, PROTIME in the last 168 hours. Cardiac Enzymes: No results for input(s): CKTOTAL, CKMB, CKMBINDEX, TROPONINI in the last 168 hours. BNP (last 3 results) No results for input(s): PROBNP in the last 8760 hours. HbA1C: No results for input(s): HGBA1C in the last 72 hours. CBG: No results for input(s):  GLUCAP in the last 168 hours. Lipid Profile: No results for input(s): CHOL, HDL, LDLCALC, TRIG, CHOLHDL, LDLDIRECT in the last 72 hours. Thyroid Function Tests: No results for input(s): TSH, T4TOTAL, FREET4, T3FREE, THYROIDAB in the last 72 hours. Anemia Panel: No results for input(s): VITAMINB12, FOLATE, FERRITIN, TIBC, IRON, RETICCTPCT in the last 72 hours. Sepsis Labs: No results for input(s): PROCALCITON, LATICACIDVEN in the last 168 hours.  No results found for this or any previous visit (from the past 240 hour(s)).       Radiology Studies: No results found.      Scheduled Meds: . demeclocycline  300 mg Oral Q12H  . dexamethasone  4 mg Oral Q8H  . etoposide  100 mg/m2 (Treatment Plan Recorded) Intravenous Once  . feeding supplement (ENSURE ENLIVE)  237 mL Oral BID BM  . fentaNYL  25 mcg Transdermal Q72H  . levothyroxine  75 mcg Oral QAC breakfast  . morphine  15 mg Oral Q12H  . polyethylene glycol  17 g Oral Daily  . senna  2 tablet Oral Daily  . sodium chloride flush  10 mL Intravenous Q12H  . sodium chloride  1 g Oral TID WC   Continuous Infusions: . sodium chloride    . dexamethasone (DECADRON) IVPB [facility]       LOS: 12 days    Time spent:35 mins. More  than 50% of that time was spent in counseling and/or coordination of care.      Shelly Coss, MD [facility] Pager [phone]  If 7PM-7AM, please contact night-coverage [email or web address] Password [email or web address] 05/08/2018, 11:25 AM

## 2018-05-08 NOTE — Progress Notes (Signed)
Oncology Nurse Navigator Documentation  Oncology Nurse Navigator Flowsheets 05/08/2018  Navigator Location [facility]  Navigator Encounter Type Other/I received an update from [employer] yesterday that patient's son would like to speak to me about patients DX, stage, and projected outcomes.  I updated Dr. Julien Nordmann and he called the son and spoke with him.    Patient Visit Type Inpatient  Treatment Phase Treatment  Barriers/Navigation Needs Coordination of Care  Interventions Coordination of Care  Coordination of Care Other  Acuity Level 2  Time Spent with Patient 15

## 2018-05-08 NOTE — Progress Notes (Signed)
Physical Therapy Treatment Patient Details Name: [name] [name] MRN: [id number] DOB: 11-18-1940 Today's Date: 05/08/2018    History of Present Illness 77 yo female admitted  with hyponatremia secondary to lung CA with widespread mets; Recent hx of R pubic ramus fx, sacral fx, R L5 transverse process fx. Hx of COPD, cigarette smoker, liver mass, hypothyroidism, chronic back pain. MRI=Bulky dorsal epidural tumor at T6 results in significant cord    PT Comments    Pt with continued LE weakness and complaints of altered sensation of bilateral LEs, especially evident in feet during gait. Verbal cuing provided throughout session for safety. Pt required increased time for all mobility, and PT performed pericare x3 during session due to pt soiling. RN notified of redness around anus and perineal area, moisture barrier applied. PT to continue to follow acutely.    Follow Up Recommendations  Supervision/Assistance - 24 hour;SNF( )     Equipment Recommendations  None recommended by PT    Recommendations for Other Services       Precautions / Restrictions Precautions Precautions: Fall Precaution Comments: pubic ramus fracture in October, mets to spine  Restrictions Weight Bearing Restrictions: No Other Position/Activity Restrictions: WBAT    Mobility  Bed Mobility Overal bed mobility: Needs Assistance Bed Mobility: Rolling;Sidelying to Sit;Sit to Sidelying Rolling: Min assist;+2 for safety/equipment Sidelying to sit: Min assist;HOB elevated     Sit to sidelying: Min assist;+2 for safety/equipment;HOB elevated General bed mobility comments: Pt rolling bilaterally multiple times for pericare this session, once at start of session and again after session. Min assist for rolling from shoulder girdle and pelvis, pt with use of bedrails to help her roll. Min assist for sidelying<>sit for trunk elevation, LE management, verbal cuing for scooting to EOB.   Transfers Overall transfer level:  Needs assistance Equipment used: Rolling walker (2 wheeled) Transfers: Sit to/from [facility] Sit to Stand: Min assist;+2 safety/equipment;From elevated surface Stand pivot transfers: Min assist;+2 safety/equipment;Mod assist       General transfer comment: Sit to stand x3 during session, and stand pivot x2, moving from recliner to [facility] and BSC to bed. Pt with min assist for steadying, at times mod assist during stand pivot hand placeement, correcting balance and guiding pt's pelvis to seat target.   Ambulation/Gait Ambulation/Gait assistance: Min assist;+2 safety/equipment(chair follow ) Gait Distance (Feet): 15 Feet Assistive device: Rolling walker (2 wheeled) Gait Pattern/deviations: Step-through pattern;Decreased stride length;Narrow base of support;Wide base of support;Drifts right/left Gait velocity: decr    General Gait Details: Min assist for steadying, guiding RW. Verbal cuing for stepping into RW. Pt with variable step length and width, suspected due to altered sensation of bilateral feet. Pt needing to stop ambulation due to fatigue and pt urinating.    Stairs             Wheelchair Mobility    Modified Rankin (Stroke Patients Only)       Balance Overall balance assessment: Needs assistance;History of Falls Sitting-balance support: No upper extremity supported Sitting balance-Leahy Scale: Fair     Standing balance support: During functional activity;Bilateral upper extremity supported Standing balance-Leahy Scale: Poor Standing balance comment: relies heavily on RW and PT steadying, increasingly unsteady today                             Cognition Arousal/Alertness: Awake/alert Behavior During Therapy: WFL for tasks assessed/performed Overall Cognitive Status: Within Functional Limits for tasks assessed  General Comments: Pt continuing to perseverate on bilateral thighs being "big  rolls of fat". Pt also discussing how she feels a figure resting next to her when she wakes up, and isn't sure if the figure is female or female. PT suggested chaplain services to pt and pt's son.      Exercises Other Exercises Other Exercises: heel slides, x10, both sides, AROM Other Exercises: bridges, x4, AROM during bed mobility and for pericare    General Comments General comments (skin integrity, edema, etc.): PT performed pericare x3 during session, once at start of session, once after ambulation on BSC, and again when pt returned to bed. Redness noted around pt anus and labia majora/groin. RN informed, barrier cream applied. Purewick and pad placed.      Pertinent Vitals/Pain Pain Assessment: Faces Faces Pain Scale: Hurts even more Pain Location: back, with rolling  Pain Descriptors / Indicators: Discomfort;Sore Pain Intervention(s): Limited activity within patient's tolerance;Repositioned;Monitored during session;Premedicated before session    Home Living                      Prior Function            PT Goals (current goals can now be found in the care plan section) Acute Rehab PT Goals Patient Stated Goal: none stated  PT Goal Formulation: With patient Time For Goal Achievement: 05/17/18 Potential to Achieve Goals: Good Progress towards PT goals: Progressing toward goals    Frequency    Min 2X/week      PT Plan Current plan remains appropriate    Co-evaluation              AM-PAC PT "6 Clicks" Daily Activity  Outcome Measure  Difficulty turning over in bed (including adjusting bedclothes, sheets and blankets)?: Unable Difficulty moving from lying on back to sitting on the side of the bed? : Unable Difficulty sitting down on and standing up from a chair with arms (e.g., wheelchair, bedside commode, etc,.)?: Unable Help needed moving to and from a bed to chair (including a wheelchair)?: A Little Help needed walking in hospital room?: A  Little Help needed climbing 3-5 steps with a railing? : A Lot 6 Click Score: 11    End of Session Equipment Utilized During Treatment: Gait belt Activity Tolerance: Patient limited by fatigue Patient left: with call bell/phone within reach;in chair;with chair alarm set;with family/visitor present Nurse Communication: Mobility status;Other (comment)(redness along anus and perineal area) PT Visit Diagnosis: Muscle weakness (generalized) (M62.81);Other abnormalities of gait and mobility (R26.89)     Time: 7494-4967 PT Time Calculation (min) (ACUTE ONLY): 51 min  Charges:  $Gait Training: 8-22 mins $Therapeutic Activity: 23-37 mins                    Julien Girt, PT Acute Rehabilitation Services Pager [phone]  Office [phone]   Randee Upchurch D [name] 05/08/2018, 12:58 PM

## 2018-05-09 ENCOUNTER — Ambulatory Visit
Admit: 2018-05-09 | Discharge: 2018-05-09 | Disposition: A | Discharge status: Home / Self Care | Payer: Medicare Other | Attending: Radiation Oncology | Admitting: Radiation Oncology

## 2018-05-09 LAB — BASIC METABOLIC PANEL
Anion gap: 9 (ref 5–15)
BUN: 43 mg/dL — ABNORMAL HIGH (ref 8–23)
CALCIUM: 8.4 mg/dL — AB (ref 8.9–10.3)
CHLORIDE: 94 mmol/L — AB (ref 98–111)
CO2: 24 mmol/L (ref 22–32)
Creatinine, Ser: 0.62 mg/dL (ref 0.44–1.00)
GFR calc non Af Amer: >60 mL/min (ref >60)
GLUCOSE: 121 mg/dL — AB (ref 70–99)
Potassium: 5.4 mmol/L — ABNORMAL HIGH (ref 3.5–5.1)
Sodium: 127 mmol/L — ABNORMAL LOW (ref 135–145)

## 2018-05-09 MED ORDER — SODIUM CHLORIDE 0.9 % IV SOLN
100.0000 mg/m2 | Freq: Once | INTRAVENOUS | Status: AC
  Administered 2018-05-09: 180 mg via INTRAVENOUS
  Filled 2018-05-09: qty 9

## 2018-05-09 MED ORDER — SODIUM POLYSTYRENE SULFONATE 15 GM/60ML PO SUSP
15.0000 g | Freq: Once | ORAL | Status: AC
  Administered 2018-05-09: 15 g via ORAL
  Filled 2018-05-09: qty 60

## 2018-05-09 MED ORDER — SODIUM CHLORIDE 0.9 % IV SOLN
10.0000 mg | Freq: Once | INTRAVENOUS | Status: AC
  Administered 2018-05-09: 10 mg via INTRAVENOUS
  Filled 2018-05-09: qty 1

## 2018-05-09 NOTE — Care Management Important Message (Signed)
Important Message  Patient Details  Name: [name] [name] MRN: [id number] Date of Birth: [birth year]   Medicare Important Message Given:  Yes    Kerin Salen 05/09/2018, 10:33 AMImportant Message  Patient Details  Name: [name] [name] MRN: [id number] Date of Birth: [birth year]   Medicare Important Message Given:  Yes    Kerin Salen 05/09/2018, 10:33 AM

## 2018-05-09 NOTE — Progress Notes (Signed)
PROGRESS NOTE    [name] [name]  IRJ:[id number] DOB: Jun 05, 1941 DOA: 04/25/2018 PCP: Marin Olp, MD   Brief Narrative: Patient is a 77 year old female with past medical history of COPD, diverticulosis, chronic back pain, hyperlipidemia, history of smoking who was initially admitted here for the management of recurrent hyponatremia.  Patient had chronic back pain issues and MRI was done which led to the incidental discovery of widespread metastatic lung cancer.  Patient is status post biopsy. Oncology is following her and she has been started  on chemotherapy here on 05/07/18.  Assessment & Plan:   Active Problems:   Hyponatremia  Metastatic lung  cancer: Pathology showed metastatic small cell cancer with lung primary.  CT chest/abdomen/pelvis showed a 3 cm lung mass suspicious for bronchogenic carcinoma with hepatic metastasis and widespread bony metastasis.  Oncology and radiation oncology consulted.  MRI of the brain did not show any metastatic lesions.  She is status post ultrasound-guided liver biopsy on 04/29/2018 which showed small cell carcinoma. Oncology has already been consulted.  Started on chemotherapy here,Day 3.  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  Continue dexamethasone.  Radiation oncology following. PT recommended skilled nursing facility on discharge. SW consulted. She has incontinence and bilateral lower extremity weakness.  Back pain: Chronic but has recently exacerbated.  Associated with metastasis to spine.  Continue pain management. Started on fentanyl patch and long-acting morphine.  Continue bowel regimen with pain management.  Hyponatremia: Initially admitted for hyponatremia.  Sodium level 127 today.  This is secondary to SIADH.  Continue to  demeclocycline and fluid restriction of 2 L a day.Also on salt tablets.  Hyperkalemia: We will give a dose of Kayexalate today.  Acute metabolic encephalopathy: Resolved  Diastolic CHF: 2D echocardiogram showed ejection function of 50% with grade 1 diastolic dysfunction.  Not on diuretics  Hypothyroidism: Continue Synthyroid  Recent ESBL UTI: Completed a course of meropenem  Deconditioning/debility: PT has recommended skilled nursing facility placement.SW consulted  Leukocytosis: Most likely secondary to dexamethasone.  We will continue monitor the trend.   DVT prophylaxis:SCD Code Status: Full Family Communication: Discussed with son at the bedside Disposition Plan: Started on chemotherapy.  Will be discharged to skilled nursing facility when she finishes this first cycle of chemo. Today is her third day of chemo.  She is stable to be discharged to skilled nursing facility as soon as the bed is available unless further plan as per oncology.   Consultants: Oncology  Procedures: Biopsy  Antimicrobials: None  Subjective: Patient seen and examined the bedside this morning.  She is hemodynamically stable.  Back pain has significantly improved .  SShe denies any chest pain, shortness of breath, nausea or vomiting. She was concerned  about the water restriction.  Objective: Vitals:   05/08/18 1057 05/08/18 1835 05/08/18 2131 05/09/18 0506  BP: 104/69 110/70 102/72 98/68  Pulse:  78 87 76  Resp:  18 16 16   Temp:  98.2 F (36.8 C) 97.8 F (36.6 C) 98 F (36.7 C)  TempSrc:  Oral Oral Oral  SpO2:  96% 97% 97%  Weight:      Height:        Intake/Output Summary (Last 24 hours) at 05/09/2018 1123 Last data filed at 05/09/2018 0654 Gross per 24 hour  Intake 610 ml  Output 1699 ml  Net -1089  ml   Filed Weights   05/01/18 0605 05/03/18 0500 05/05/18 0650  Weight: 65.9 kg 67.4 kg 68 kg    Examination:  General exam: Appears calm and comfortable ,Not in distress,average  built, pleasant elderly female HEENT:PERRL,Oral mucosa moist, Ear/Nose normal on gross exam Respiratory system: Bilateral equal air entry, normal vesicular breath sounds, no wheezes or crackles  Cardiovascular system: S1 & S2 heard, RRR. No JVD, murmurs, rubs, gallops or clicks. Gastrointestinal system: Abdomen is mildly distended, soft and nontender. No organomegaly or masses felt. Normal bowel sounds heard. Central nervous system: Alert and oriented. Bilateral lower extremity weakness  extremities: No edema, no clubbing ,no cyanosis, distal peripheral pulses palpable. Skin: No rashes, lesions or ulcers,no icterus ,no pallor MSK: Normal muscle bulk,tone ,power Psychiatry: Judgement and insight appear normal. Mood & affect appropriate.      Data Reviewed: I have personally reviewed following labs and imaging studies  CBC: Recent Labs  Lab 05/03/18 0534 05/04/18 0447 05/05/18 0523 05/07/18 0550 05/08/18 0549  WBC 13.5* 10.4 19.6* 21.7* 19.4*  NEUTROABS  --   --   --  19.8* 18.1*  HGB 10.9* 10.4* 10.9* 10.6* 10.7*  HCT 33.8* 32.4* 33.8* 33.6* 33.9*  MCV 89.2 86.6 86.9 90.1 88.1  PLT 391 414* 443* 385 633*   Basic Metabolic Panel: Recent Labs  Lab 05/04/18 0447 05/05/18 0523 05/07/18 0550 05/08/18 0549 05/09/18 0644  NA 128* 130* 131* 130* 127*  K 4.7 5.0 4.9 5.1 5.4*  CL 96* 95* 97* 98 94*  CO2 24 28 28 25 24   GLUCOSE 122* 114* 119* 116* 121*  BUN 33* 35* 38* 38* 43*  CREATININE 0.68 0.65 0.78 0.61 0.62  CALCIUM 8.9 8.7* 8.8* 9.0 8.4*   GFR: Estimated Creatinine Clearance: 55.5 mL/min (by C-G formula based on SCr of 0.62 mg/dL). Liver Function Tests: No results for input(s): AST, ALT, ALKPHOS, BILITOT, PROT, ALBUMIN in the last 168 hours. No results for input(s): LIPASE, AMYLASE in the last 168 hours. No results for input(s): AMMONIA in the last 168 hours. Coagulation Profile: No results for input(s): INR, PROTIME in the last 168 hours. Cardiac Enzymes: No  results for input(s): CKTOTAL, CKMB, CKMBINDEX, TROPONINI in the last 168 hours. BNP (last 3 results) No results for input(s): PROBNP in the last 8760 hours. HbA1C: No results for input(s): HGBA1C in the last 72 hours. CBG: No results for input(s): GLUCAP in the last 168 hours. Lipid Profile: No results for input(s): CHOL, HDL, LDLCALC, TRIG, CHOLHDL, LDLDIRECT in the last 72 hours. Thyroid Function Tests: No results for input(s): TSH, T4TOTAL, FREET4, T3FREE, THYROIDAB in the last 72 hours. Anemia Panel: No results for input(s): VITAMINB12, FOLATE, FERRITIN, TIBC, IRON, RETICCTPCT in the last 72 hours. Sepsis Labs: No results for input(s): PROCALCITON, LATICACIDVEN in the last 168 hours.  No results found for this or any previous visit (from the past 240 hour(s)).       Radiology Studies: No results found.      Scheduled Meds: . demeclocycline  300 mg Oral Q12H  . dexamethasone  4 mg Oral Q8H  . etoposide  100 mg/m2 (Treatment Plan Recorded) Intravenous Once  . feeding supplement (ENSURE ENLIVE)  237 mL Oral BID BM  . fentaNYL  25 mcg Transdermal Q72H  . levothyroxine  75 mcg Oral QAC breakfast  . morphine  15 mg Oral Q12H  . polyethylene glycol  17 g Oral Daily  . senna  2 tablet Oral Daily  . sodium chloride flush  10 mL Intravenous Q12H  . sodium chloride  1 g Oral TID WC   Continuous Infusions:    LOS: 13 days    Time spent:35 mins. More than 50% of that time was spent in counseling and/or coordination of care.      Shelly Coss, MD [facility] Pager [phone]  If 7PM-7AM, please contact night-coverage [email or web address] Password [facility] 05/09/2018, 11:23 AM

## 2018-05-09 NOTE — Progress Notes (Signed)
[facility] Therapy Treatment Record Phone [phone]   Radiation Therapy was administered to [name] [name] on: 05/09/2018  4:54 PM and was treatment # **6* out of a planned course of 10 treatments.  Radiation Treatment  1). Beam photons with 6-10 energy  2). Brachytherapy None  3). Stereotactic Radiosurgery None  4). Other [employer], RT (T)

## 2018-05-09 NOTE — Progress Notes (Signed)
Provided SNF bed offers to son to review- will notify CSW of selections, insurance authorization request can be initiated then.  Sharren Bridge, MSW, LCSW Clinical Social Work 05/09/2018 [phone]

## 2018-05-10 ENCOUNTER — Ambulatory Visit: Payer: Medicare Other

## 2018-05-10 ENCOUNTER — Ambulatory Visit
Admit: 2018-05-10 | Discharge: 2018-05-10 | Disposition: A | Discharge status: Home / Self Care | Payer: Medicare Other | Attending: Radiation Oncology | Admitting: Radiation Oncology

## 2018-05-10 ENCOUNTER — Telehealth: Payer: Self-pay | Admitting: Internal Medicine

## 2018-05-10 LAB — CBC WITH DIFFERENTIAL/PLATELET
Abs Immature Granulocytes: 0.12 10*3/uL — ABNORMAL HIGH (ref 0.00–0.07)
BASOS ABS: 0 10*3/uL (ref 0.0–0.1)
BASOS PCT: 0 %
EOS PCT: 0 %
Eosinophils Absolute: 0 10*3/uL (ref 0.0–0.5)
HEMATOCRIT: 31.5 % — AB (ref 36.0–46.0)
Hemoglobin: 10.2 g/dL — ABNORMAL LOW (ref 12.0–15.0)
Immature Granulocytes: 1 %
Lymphocytes Relative: 1 %
Lymphs Abs: 0.2 10*3/uL — ABNORMAL LOW (ref 0.7–4.0)
MCH: 28.5 pg (ref 26.0–34.0)
MCHC: 32.4 g/dL (ref 30.0–36.0)
MCV: 88 fL (ref 80.0–100.0)
Monocytes Absolute: 0.6 10*3/uL (ref 0.1–1.0)
Monocytes Relative: 4 %
NRBC: 0 % (ref 0.0–0.2)
Neutro Abs: 15.2 10*3/uL — ABNORMAL HIGH (ref 1.7–7.7)
Neutrophils Relative %: 94 %
Platelets: 395 10*3/uL (ref 150–400)
RBC: 3.58 MIL/uL — AB (ref 3.87–5.11)
RDW: 15.3 % (ref 11.5–15.5)
WBC: 16.1 10*3/uL — AB (ref 4.0–10.5)

## 2018-05-10 LAB — BASIC METABOLIC PANEL
Anion gap: 7 (ref 5–15)
BUN: 41 mg/dL — ABNORMAL HIGH (ref 8–23)
CO2: 25 mmol/L (ref 22–32)
Calcium: 8.2 mg/dL — ABNORMAL LOW (ref 8.9–10.3)
Chloride: 100 mmol/L (ref 98–111)
Creatinine, Ser: 0.67 mg/dL (ref 0.44–1.00)
GFR calc non Af Amer: >60 mL/min (ref >60)
Glucose, Bld: 120 mg/dL — ABNORMAL HIGH (ref 70–99)
Potassium: 4.9 mmol/L (ref 3.5–5.1)
SODIUM: 132 mmol/L — AB (ref 135–145)

## 2018-05-10 MED ORDER — LIDOCAINE VISCOUS HCL 2 % MT SOLN
15.0000 mL | OROMUCOSAL | Status: DC | PRN
  Administered 2018-05-11: 15 mL via OROMUCOSAL
  Filled 2018-05-10: qty 15

## 2018-05-10 NOTE — Telephone Encounter (Signed)
Scheduled appt per 11/21 sch message - per patient does not hear well and request we call the son about appt - called son and no answer. Left message with appt date and time

## 2018-05-10 NOTE — Progress Notes (Signed)
Physical Therapy Treatment Patient Details Name: [name] [name] MRN: [id number] DOB: May 18, 1941 Today's Date: 05/10/2018    History of Present Illness 77 yo female admitted  with hyponatremia secondary to lung CA with widespread mets; Recent hx of R pubic ramus fx, sacral fx, R L5 transverse process fx. Hx of COPD, cigarette smoker, liver mass, hypothyroidism, chronic back pain. MRI=Bulky dorsal epidural tumor at T6 results in significant cord    PT Comments    Pt with increased difficulty with command following this session, required multiple verbal reinforcements to perform LE exercises. Pt with notable LE weakness L>R this session, and required AAROM for multiple exercises to perform them. Sensation of bilateral LEs decreased. Pt informed PT that she will be going to SNF soon and spoke about end of life, stating "I will hate to leave them (her family)". Son at bedside and appeared tearful. PT to continue to follow to support pt in mobility desires, as pt wants to maintain mobility.    Follow Up Recommendations  Supervision/Assistance - 24 hour;SNF( )     Equipment Recommendations  None recommended by PT    Recommendations for Other Services       Precautions / Restrictions Precautions Precautions: Fall Precaution Comments: pubic ramus fracture in October, mets to spine  Restrictions Weight Bearing Restrictions: No Other Position/Activity Restrictions: WBAT    Mobility  Bed Mobility               General bed mobility comments: no bed mobilty performed this session, limited PT session to supine LE exercises   Transfers                    Ambulation/Gait                 Stairs             Wheelchair Mobility    Modified Rankin (Stroke Patients Only)       Balance Overall balance assessment: Needs assistance;History of Falls Sitting-balance support: No upper extremity supported Sitting balance-Leahy Scale: Fair     Standing balance  support: During functional activity;Bilateral upper extremity supported Standing balance-Leahy Scale: Poor                              Cognition Arousal/Alertness: Awake/alert Behavior During Therapy: WFL for tasks assessed/performed Overall Cognitive Status: Impaired/Different from baseline Area of Impairment: Following commands;Safety/judgement                       Following Commands: Follows one step commands inconsistently Safety/Judgement: Decreased awareness of safety;Decreased awareness of deficits            Exercises General Exercises - Lower Extremity Ankle Circles/Pumps: AROM;Both;20 reps;Supine Quad Sets: AROM;Both;10 reps;Supine Gluteal Sets: AROM;Both;10 reps;Supine(bridges with assist from PT at pelvis and LEs ) Heel Slides: AAROM;Both;10 reps;Supine Straight Leg Raises: AAROM;Both;10 reps;Supine    General Comments        Pertinent Vitals/Pain Pain Assessment: Faces Faces Pain Scale: Hurts even more Pain Location: back  Pain Descriptors / Indicators: Sore;Aching;Discomfort Pain Intervention(s): Limited activity within patient's tolerance;Repositioned;Monitored during session    Home Living                      Prior Function            PT Goals (current goals can now be found in the care plan section) Acute  Rehab PT Goals Patient Stated Goal: none stated  PT Goal Formulation: With patient Time For Goal Achievement: 05/17/18 Potential to Achieve Goals: Good Progress towards PT goals: Progressing toward goals    Frequency    Min 2X/week      PT Plan Current plan remains appropriate    Co-evaluation              AM-PAC PT "6 Clicks" Mobility   Outcome Measure  Help needed turning from your back to your side while in a flat bed without using bedrails?: A Little Help needed moving from lying on your back to sitting on the side of a flat bed without using bedrails?: A Lot Help needed moving to and from  a bed to a chair (including a wheelchair)?: A Little Help needed standing up from a chair using your arms (e.g., wheelchair or bedside chair)?: A Little Help needed to walk in hospital room?: A Lot Help needed climbing 3-5 steps with a railing? : A Lot 6 Click Score: 15    End of Session   Activity Tolerance: Patient limited by fatigue Patient left: with call bell/phone within reach;with family/visitor present;in bed;with bed alarm set Nurse Communication: Mobility status PT Visit Diagnosis: Muscle weakness (generalized) (M62.81);Other abnormalities of gait and mobility (R26.89)     Time: 5697-9480 PT Time Calculation (min) (ACUTE ONLY): 15 min  Charges:  $Therapeutic Exercise: 8-22 mins                     Ruble Pumphrey Conception Chancy, PT Acute Rehabilitation Services Pager [phone]  Office [phone]    Roxine Caddy D [name] 05/10/2018, 7:13 PM

## 2018-05-10 NOTE — Progress Notes (Signed)
PROGRESS NOTE    [name] [name]  ZGY:[id number] DOB: [birth year] DOA: 04/25/2018 PCP: Marin Olp, MD   Brief Narrative: Patient is a 77 year old female with past medical history of COPD, diverticulosis, chronic back pain, hyperlipidemia, history of smoking who was initially admitted here for the management of recurrent hyponatremia.  Patient had chronic back pain issues and MRI was done which led to the incidental discovery of widespread metastatic lung cancer.  Patient is status post biopsy. Oncology is following her and she has been started  on chemotherapy here on 05/07/18. Current plan is to discharge to skilled nursing facility as soon as the bed is available.  Assessment & Plan:   Active Problems:   Hyponatremia  Metastatic lung  cancer: Pathology showed metastatic small cell cancer with lung primary.  CT chest/abdomen/pelvis showed a 3 cm lung mass suspicious for bronchogenic carcinoma with hepatic metastasis and widespread bony metastasis.  Oncology and radiation oncology consulted.  MRI of the brain did not show any metastatic lesions.  She is status post ultrasound-guided liver biopsy on 04/29/2018 which showed small cell carcinoma. Oncology was followign.  Started on chemotherapy here,completed 3 days of cycle 1.  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  Continue dexamethasone.  Radiation oncology following and she is undergoing radiation therapy. PT recommended skilled nursing facility on discharge. SW consulted. She has incontinence and bilateral lower extremity weakness.  Back pain: Chronic but has recently exacerbated.  Associated with metastasis to spine.  Continue pain management. Started on fentanyl patch and long-acting morphine.  Continue bowel regimen with  pain management.  Hyponatremia: Initially admitted for hyponatremia.  Sodium level 132 today.  This is secondary to SIADH.  Continue to demeclocycline and fluid restriction of 2 L a day.Also on salt tablets.  Hyperkalemia: We will give a dose of Kayexalate today.  Acute metabolic encephalopathy: Resolved  Diastolic CHF: 2D echocardiogram showed ejection function of 50% with grade 1 diastolic dysfunction.  Not on diuretics  Hypothyroidism: Continue Synthyroid  Recent ESBL UTI: Completed a course of meropenem  Deconditioning/debility: PT has recommended skilled nursing facility placement.SW consulted  Leukocytosis: Most likely secondary to dexamethasone.  We will continue monitor the trend.   DVT prophylaxis:SCD Code Status: Full Family Communication: None  at the bedside Disposition Plan:  She is stable to be discharged to skilled nursing facility as soon as the bed is available   Consultants: Oncology  Procedures: Biopsy  Antimicrobials: None  Subjective: Patient seen and examined the bedside this morning.  She is hemodynamically stable.  Back pain has significantly improved .  She denies any chest pain, shortness of breath, nausea or vomiting. She has been eating well  Objective: Vitals:   05/09/18 0506 05/09/18 1500 05/09/18 2031 05/10/18 0631  BP: 98/68 103/68 92/63 132/82  Pulse: 76 76 80 80  Resp: 16 16 19 18   Temp: 98 F (36.7 C) 98.3 F (36.8 C) 98 F (36.7 C) 97.8 F (36.6 C)  TempSrc: Oral Oral Oral Oral  SpO2: 97% 93% 93% 97%  Weight:      Height:        Intake/Output Summary (Last 24 hours) at 05/10/2018 1304 Last data filed at 05/10/2018 0728 Gross per 24 hour  Intake 370 ml  Output 1000 ml  Net -630 ml   Filed Weights   05/01/18 5916  05/03/18 0500 05/05/18 0650  Weight: 65.9 kg 67.4 kg 68 kg    Examination:  General exam: Appears calm and comfortable ,Not in distress,average built, pleasant elderly female HEENT:PERRL,Oral mucosa moist,  Ear/Nose normal on gross exam Respiratory system: Bilateral equal air entry, normal vesicular breath sounds, no wheezes or crackles  Cardiovascular system: S1 & S2 heard, RRR. No JVD, murmurs, rubs, gallops or clicks. Gastrointestinal system: Abdomen is mildly distended, soft and nontender. No organomegaly or masses felt. Normal bowel sounds heard. Central nervous system: Alert and oriented. Bilateral lower extremity weakness  extremities: No edema, no clubbing ,no cyanosis, distal peripheral pulses palpable. Skin: No rashes, lesions or ulcers,no icterus ,no pallor MSK: Normal muscle bulk,tone ,power Psychiatry: Judgement and insight appear normal. Mood & affect appropriate.      Data Reviewed: I have personally reviewed following labs and imaging studies  CBC: Recent Labs  Lab 05/04/18 0447 05/05/18 0523 05/07/18 0550 05/08/18 0549 05/10/18 0629  WBC 10.4 19.6* 21.7* 19.4* 16.1*  NEUTROABS  --   --  19.8* 18.1* 15.2*  HGB 10.4* 10.9* 10.6* 10.7* 10.2*  HCT 32.4* 33.8* 33.6* 33.9* 31.5*  MCV 86.6 86.9 90.1 88.1 88.0  PLT 414* 443* 385 426* 628   Basic Metabolic Panel: Recent Labs  Lab 05/05/18 0523 05/07/18 0550 05/08/18 0549 05/09/18 0644 05/10/18 0629  NA 130* 131* 130* 127* 132*  K 5.0 4.9 5.1 5.4* 4.9  CL 95* 97* 98 94* 100  CO2 28 28 25 24 25   GLUCOSE 114* 119* 116* 121* 120*  BUN 35* 38* 38* 43* 41*  CREATININE 0.65 0.78 0.61 0.62 0.67  CALCIUM 8.7* 8.8* 9.0 8.4* 8.2*   GFR: Estimated Creatinine Clearance: 55.5 mL/min (by C-G formula based on SCr of 0.67 mg/dL). Liver Function Tests: No results for input(s): AST, ALT, ALKPHOS, BILITOT, PROT, ALBUMIN in the last 168 hours. No results for input(s): LIPASE, AMYLASE in the last 168 hours. No results for input(s): AMMONIA in the last 168 hours. Coagulation Profile: No results for input(s): INR, PROTIME in the last 168 hours. Cardiac Enzymes: No results for input(s): CKTOTAL, CKMB, CKMBINDEX, TROPONINI in the  last 168 hours. BNP (last 3 results) No results for input(s): PROBNP in the last 8760 hours. HbA1C: No results for input(s): HGBA1C in the last 72 hours. CBG: No results for input(s): GLUCAP in the last 168 hours. Lipid Profile: No results for input(s): CHOL, HDL, LDLCALC, TRIG, CHOLHDL, LDLDIRECT in the last 72 hours. Thyroid Function Tests: No results for input(s): TSH, T4TOTAL, FREET4, T3FREE, THYROIDAB in the last 72 hours. Anemia Panel: No results for input(s): VITAMINB12, FOLATE, FERRITIN, TIBC, IRON, RETICCTPCT in the last 72 hours. Sepsis Labs: No results for input(s): PROCALCITON, LATICACIDVEN in the last 168 hours.  No results found for this or any previous visit (from the past 240 hour(s)).       Radiology Studies: No results found.      Scheduled Meds: . demeclocycline  300 mg Oral Q12H  . dexamethasone  4 mg Oral Q8H  . feeding supplement (ENSURE ENLIVE)  237 mL Oral BID BM  . fentaNYL  25 mcg Transdermal Q72H  . levothyroxine  75 mcg Oral QAC breakfast  . morphine  15 mg Oral Q12H  . polyethylene glycol  17 g Oral Daily  . senna  2 tablet Oral Daily  . sodium chloride flush  10 mL Intravenous Q12H  . sodium chloride  1 g Oral TID WC   Continuous Infusions:    LOS:  14 days    Time spent:35 mins. More than 50% of that time was spent in counseling and/or coordination of care.      Shelly Coss, MD [facility] Pager [phone]  If 7PM-7AM, please contact night-coverage [email or web address] Password [facility] 05/10/2018, 1:04 PM

## 2018-05-10 NOTE — Progress Notes (Signed)
PT Cancellation Note  Patient Details Name: [name] [name] MRN: [id number] DOB: 1940-10-01   Cancelled Treatment:    Reason Eval/Treat Not Completed: Patient at procedure or test/unavailable - PT arrived at 11:52, pt reporting going to radiation at 12:00. Pt deferring at this time, PT to check back as schedule allows.  Julien Girt, PT Acute Rehabilitation Services Pager [phone]  Office [phone]    Roxine Caddy D [name] 05/10/2018, 12:24 PM

## 2018-05-11 ENCOUNTER — Inpatient Hospital Stay: Payer: Medicare Other | Attending: Oncology

## 2018-05-11 DIAGNOSIS — C7951 Secondary malignant neoplasm of bone: Secondary | ICD-10-CM | POA: Diagnosis not present

## 2018-05-11 DIAGNOSIS — M2550 Pain in unspecified joint: Secondary | ICD-10-CM | POA: Diagnosis not present

## 2018-05-11 DIAGNOSIS — F17210 Nicotine dependence, cigarettes, uncomplicated: Secondary | ICD-10-CM | POA: Diagnosis present

## 2018-05-11 DIAGNOSIS — K649 Unspecified hemorrhoids: Secondary | ICD-10-CM | POA: Diagnosis not present

## 2018-05-11 DIAGNOSIS — R142 Eructation: Secondary | ICD-10-CM | POA: Diagnosis not present

## 2018-05-11 DIAGNOSIS — N3080 Other cystitis without hematuria: Secondary | ICD-10-CM | POA: Diagnosis not present

## 2018-05-11 DIAGNOSIS — G834 Cauda equina syndrome: Secondary | ICD-10-CM | POA: Diagnosis not present

## 2018-05-11 DIAGNOSIS — N1330 Unspecified hydronephrosis: Secondary | ICD-10-CM | POA: Diagnosis not present

## 2018-05-11 DIAGNOSIS — G8220 Paraplegia, unspecified: Secondary | ICD-10-CM | POA: Diagnosis not present

## 2018-05-11 DIAGNOSIS — E871 Hypo-osmolality and hyponatremia: Secondary | ICD-10-CM | POA: Diagnosis not present

## 2018-05-11 DIAGNOSIS — D61810 Antineoplastic chemotherapy induced pancytopenia: Secondary | ICD-10-CM | POA: Diagnosis not present

## 2018-05-11 DIAGNOSIS — Z7401 Bed confinement status: Secondary | ICD-10-CM | POA: Diagnosis not present

## 2018-05-11 DIAGNOSIS — C3432 Malignant neoplasm of lower lobe, left bronchus or lung: Secondary | ICD-10-CM | POA: Insufficient documentation

## 2018-05-11 DIAGNOSIS — I5032 Chronic diastolic (congestive) heart failure: Secondary | ICD-10-CM | POA: Diagnosis not present

## 2018-05-11 DIAGNOSIS — D701 Agranulocytosis secondary to cancer chemotherapy: Secondary | ICD-10-CM | POA: Diagnosis not present

## 2018-05-11 DIAGNOSIS — R278 Other lack of coordination: Secondary | ICD-10-CM | POA: Diagnosis not present

## 2018-05-11 DIAGNOSIS — Z66 Do not resuscitate: Secondary | ICD-10-CM | POA: Diagnosis not present

## 2018-05-11 DIAGNOSIS — N390 Urinary tract infection, site not specified: Secondary | ICD-10-CM | POA: Diagnosis not present

## 2018-05-11 DIAGNOSIS — R2681 Unsteadiness on feet: Secondary | ICD-10-CM | POA: Diagnosis not present

## 2018-05-11 DIAGNOSIS — K625 Hemorrhage of anus and rectum: Secondary | ICD-10-CM | POA: Diagnosis present

## 2018-05-11 DIAGNOSIS — G9529 Other cord compression: Secondary | ICD-10-CM | POA: Diagnosis present

## 2018-05-11 DIAGNOSIS — E7800 Pure hypercholesterolemia, unspecified: Secondary | ICD-10-CM | POA: Diagnosis present

## 2018-05-11 DIAGNOSIS — E039 Hypothyroidism, unspecified: Secondary | ICD-10-CM | POA: Diagnosis not present

## 2018-05-11 DIAGNOSIS — R627 Adult failure to thrive: Secondary | ICD-10-CM | POA: Diagnosis present

## 2018-05-11 DIAGNOSIS — K30 Functional dyspepsia: Secondary | ICD-10-CM | POA: Diagnosis present

## 2018-05-11 DIAGNOSIS — G893 Neoplasm related pain (acute) (chronic): Secondary | ICD-10-CM | POA: Diagnosis not present

## 2018-05-11 DIAGNOSIS — M8448XA Pathological fracture, other site, initial encounter for fracture: Secondary | ICD-10-CM | POA: Diagnosis not present

## 2018-05-11 DIAGNOSIS — M1611 Unilateral primary osteoarthritis, right hip: Secondary | ICD-10-CM | POA: Diagnosis not present

## 2018-05-11 DIAGNOSIS — J449 Chronic obstructive pulmonary disease, unspecified: Secondary | ICD-10-CM | POA: Diagnosis not present

## 2018-05-11 DIAGNOSIS — K219 Gastro-esophageal reflux disease without esophagitis: Secondary | ICD-10-CM | POA: Diagnosis not present

## 2018-05-11 DIAGNOSIS — T451X5A Adverse effect of antineoplastic and immunosuppressive drugs, initial encounter: Secondary | ICD-10-CM | POA: Diagnosis not present

## 2018-05-11 DIAGNOSIS — K121 Other forms of stomatitis: Secondary | ICD-10-CM | POA: Diagnosis not present

## 2018-05-11 DIAGNOSIS — G992 Myelopathy in diseases classified elsewhere: Secondary | ICD-10-CM | POA: Diagnosis not present

## 2018-05-11 DIAGNOSIS — R590 Localized enlarged lymph nodes: Secondary | ICD-10-CM | POA: Diagnosis present

## 2018-05-11 DIAGNOSIS — C787 Secondary malignant neoplasm of liver and intrahepatic bile duct: Secondary | ICD-10-CM | POA: Diagnosis not present

## 2018-05-11 DIAGNOSIS — S22070D Wedge compression fracture of T9-T10 vertebra, subsequent encounter for fracture with routine healing: Secondary | ICD-10-CM | POA: Diagnosis not present

## 2018-05-11 DIAGNOSIS — I513 Intracardiac thrombosis, not elsewhere classified: Secondary | ICD-10-CM | POA: Diagnosis not present

## 2018-05-11 LAB — BASIC METABOLIC PANEL
ANION GAP: 7 (ref 5–15)
BUN: 41 mg/dL — ABNORMAL HIGH (ref 8–23)
CHLORIDE: 100 mmol/L (ref 98–111)
CO2: 26 mmol/L (ref 22–32)
Calcium: 8.1 mg/dL — ABNORMAL LOW (ref 8.9–10.3)
Creatinine, Ser: 0.69 mg/dL (ref 0.44–1.00)
GFR calc non Af Amer: >60 mL/min (ref >60)
Glucose, Bld: 134 mg/dL — ABNORMAL HIGH (ref 70–99)
POTASSIUM: 4.9 mmol/L (ref 3.5–5.1)
SODIUM: 133 mmol/L — AB (ref 135–145)

## 2018-05-11 MED ORDER — MORPHINE SULFATE ER 15 MG PO TBCR: 15.0000 mg | EXTENDED_RELEASE_TABLET | Freq: Two times a day (BID) | ORAL | 0 refills | Status: DC

## 2018-05-11 MED ORDER — POLYETHYLENE GLYCOL 3350 17 G PO PACK: 17.0000 g | PACK | Freq: Every day | ORAL | 0 refills | Status: AC

## 2018-05-11 MED ORDER — NYSTATIN 100000 UNIT/ML MT SUSP: 5.0000 mL | Freq: Four times a day (QID) | OROMUCOSAL | 0 refills | Status: AC

## 2018-05-11 MED ORDER — LIDOCAINE VISCOUS HCL 2 % MT SOLN: 15.0000 mL | OROMUCOSAL | 0 refills | Status: AC | PRN

## 2018-05-11 MED ORDER — DEXAMETHASONE 4 MG PO TABS: 4.0000 mg | ORAL_TABLET | Freq: Three times a day (TID) | ORAL | 0 refills | Status: DC

## 2018-05-11 MED ORDER — KETOROLAC TROMETHAMINE 30 MG/ML IJ SOLN
30.0000 mg | Freq: Once | INTRAMUSCULAR | Status: AC
  Administered 2018-05-11: 30 mg via INTRAVENOUS
  Filled 2018-05-11: qty 1

## 2018-05-11 MED ORDER — FENTANYL 25 MCG/HR TD PT72: 25.0000 ug | MEDICATED_PATCH | TRANSDERMAL | 0 refills | Status: AC

## 2018-05-11 MED ORDER — ENSURE ENLIVE PO LIQD: 237.0000 mL | Freq: Two times a day (BID) | ORAL | 12 refills | Status: AC

## 2018-05-11 MED ORDER — NYSTATIN 100000 UNIT/ML MT SUSP
5.0000 mL | Freq: Four times a day (QID) | OROMUCOSAL | Status: DC
  Administered 2018-05-11: 500000 [IU] via ORAL
  Filled 2018-05-11: qty 5

## 2018-05-11 MED ORDER — ALPRAZOLAM 0.5 MG PO TABS: 0.5000 mg | ORAL_TABLET | Freq: Two times a day (BID) | ORAL | 0 refills | Status: DC | PRN

## 2018-05-11 NOTE — Progress Notes (Signed)
Patient discharged to [facility] via [facility].

## 2018-05-11 NOTE — Clinical Social Work Placement (Signed)
Patient discharging to [facility].  CSW faxed appropriate documents and confirmed bed at facility. Patient's son, [name] [name], aware of discharge.  Patient will transport via [facility].  RN call report: [phone]  CLINICAL SOCIAL WORK PLACEMENT  NOTE  Date:  05/11/2018  Patient Details  Name: [name] [name] MRN: [id number] Date of Birth: Mar 30, 1941  Clinical Social Work is seeking post-discharge placement for this patient at the [place] level of care (*CSW will initial, date and re-position this form in  chart as items are completed):  Yes   Patient/family provided with [facility] Work Department's list of facilities offering this level of care within the geographic area requested by the patient (or if unable, by the patient's family).  Yes   Patient/family informed of their freedom to choose among providers that offer the needed level of care, that participate in Medicare, Medicaid or managed care program needed by the patient, have an available bed and are willing to accept the patient.      Patient/family informed of [facility]'s ownership interest in [facility] and [facility], as well as of the fact that they are under no obligation to receive care at these facilities.  PASRR submitted to EDS on       PASRR number received on       Existing PASRR number confirmed on 05/11/18     FL2 transmitted to all facilities in geographic area requested by pt/family on       FL2 transmitted to all facilities within larger geographic area on 05/07/18     Patient informed that his/her managed care company has contracts with or will negotiate with certain facilities, including the following:        Yes   Patient/family informed of bed offers received.  Patient chooses bed at [facility]     Physician recommends and patient chooses bed at      Patient to be transferred to [facility] on  05/11/18.  Patient to be transferred to facility by [facility]     Patient family notified on 05/11/18 of transfer.  Name of family member notified:  [name] [name], son     PHYSICIAN       Additional Comment:    _______________________________________________ Pricilla Holm, [place] 05/11/2018, 10:39 AM

## 2018-05-11 NOTE — Discharge Summary (Signed)
Physician Discharge Summary  [name] [name] [facility]:[id number] DOB: 07/04/1940 DOA: 04/25/2018  PCP: Marin Olp, MD  Admit date: 04/25/2018 Discharge date: 05/11/2018  Admitted From: Home Disposition: SNF  Discharge Condition:Stable CODE STATUS:FULL Diet recommendation: Regular   Brief/Interim Summary: Patient is a 77 year old female with past medical history of COPD, diverticulosis, chronic back pain, hyperlipidemia, history of smoking who was initially admitted here for the management of recurrent hyponatremia.  Patient had chronic back pain issues and MRI was done which led to the incidental discovery of widespread metastatic lung cancer.  Patient is status post biopsy. Oncology is following her and she has been started  on chemotherapy here on 05/07/18.  She has also been undergoing radiation therapy.  Her back pain has significantly improved with pain management. Patient is stable for discharge to skilled nursing facility today.  Following problems were addressed during her hospitalizing:    Metastatic lung  cancer: Pathology showed metastatic small cell cancer with lung primary.  CT chest/abdomen/pelvis showed a 3 cm lung mass suspicious for bronchogenic carcinoma with hepatic metastasis and widespread bony metastasis.  Oncology and radiation oncology consulted.  MRI of the brain did not show any metastatic lesions.  She is status post ultrasound-guided liver biopsy on 04/29/2018 which showed small cell carcinoma. Oncology has already been consulted.  Started on chemotherapy here,completed first cycle. She should follow-up with oncology, Dr. Julien Nordmann, as an outpatient.  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  Continue dexamethasone.  Radiation oncology following. PT  recommended skilled nursing facility on discharge. SW consulted. She has incontinence and bilateral lower extremity weakness. She should continue to follow with radiation oncology.  Back pain: Chronic but has recently exacerbated.  Associated with metastasis to spine.  Continue pain management. Started on fentanyl patch and long-acting morphine.  Continue bowel regimen with pain management.  Hyponatremia: Initially admitted for hyponatremia.  Sodium level 133 today.  This is secondary to SIADH.  Continue fluid restriction of 2 L a day.demeclocycline and salt tablets discontinued on discharge.  Check BMP in a week.  Hyperkalemia: Resolved  Acute metabolic encephalopathy: Resolved  Diastolic CHF: 2D echocardiogram showed ejection function of 50% with grade 1 diastolic dysfunction.  Not on diuretics  Hypothyroidism: Continue Synthyroid  Recent ESBL UTI: Completed a course of meropenem  Deconditioning/debility: PT has recommended skilled nursing facility placement.  Leukocytosis: Most likely secondary to dexamethasone.  Do CBC in a week.   Discharge Diagnoses:  Active Problems:   Hyponatremia    Discharge Instructions  Discharge Instructions    Diet - low sodium heart healthy   Complete by:  As directed    Discharge instructions   Complete by:  As directed    1) Please take  prescribed medication as instructed. 2) Follow up with oncology and radiation oncology as an outpatient to continue chemoradiation therapy. 3)Do a CBC and BMP testing in a week.   Increase activity slowly   Complete by:  As directed      Allergies as of 05/11/2018      Reactions   Atorvastatin    Intolerant to all statins   Cyclobenzaprine Other (See Comments)   Bradycardia, hypotension   Ezetimibe     INTOL to Zetia   Rosuvastatin    intolerant to all statins   Gadolinium Derivatives Nausea Only   Pt became  very nauseous after gadolinium administration//lh   Nicoderm [nicotine] Rash    Unable to use the patches---caused severe rash to area placed      Medication List    STOP taking these medications   HYDROcodone-acetaminophen 5-325 MG tablet Commonly known as:  NORCO/VICODIN   nitrofurantoin (macrocrystal-monohydrate) 100 MG capsule Commonly known as:  MACROBID     TAKE these medications   ALPRAZolam 0.5 MG tablet Commonly known as:  XANAX Take 1 tablet (0.5 mg total) by mouth 2 (two) times daily as needed for anxiety or sleep.   dexamethasone 4 MG tablet Commonly known as:  DECADRON Take 1 tablet (4 mg total) by mouth every 8 (eight) hours.   feeding supplement (ENSURE ENLIVE) Liqd Take 237 mLs by mouth 2 (two) times daily between meals.   fentaNYL 25 MCG/HR patch Commonly known as:  DURAGESIC - dosed mcg/hr Place 1 patch (25 mcg total) onto the skin every 3 (three) days. Start taking on:  05/12/2018   levothyroxine 75 MCG tablet Commonly known as:  SYNTHROID, LEVOTHROID TAKE 1 TABLET(75 MCG) BY MOUTH DAILY What changed:  See the new instructions.   lidocaine 2 % solution Commonly known as:  XYLOCAINE Use as directed 15 mLs in the mouth or throat every 3 (three) hours as needed for mouth pain (mouth ulcers).   morphine 15 MG 12 hr tablet Commonly known as:  MS CONTIN Take 1 tablet (15 mg total) by mouth every 12 (twelve) hours.   nicotine polacrilex 2 MG gum Commonly known as:  NICORETTE Take 1 each (2 mg total) by mouth as needed for smoking cessation.   nystatin 100000 UNIT/ML suspension Commonly known as:  MYCOSTATIN Take 5 mLs (500,000 Units total) by mouth 4 (four) times daily.   omeprazole 20 MG capsule Commonly known as:  PRILOSEC Take 1 capsule (20 mg total) by mouth daily.   ondansetron 4 MG disintegrating tablet Commonly known as:  ZOFRAN-ODT Take 1 tablet (4 mg total) by mouth every 8 (eight) hours as needed for nausea or vomiting.   polyethylene glycol packet Commonly known as:  MIRALAX / GLYCOLAX Take 17 g by mouth  daily. What changed:    when to take this  reasons to take this   senna 8.6 MG Tabs tablet Commonly known as:  SENOKOT Take 1 tablet (8.6 mg total) by mouth 2 (two) times daily.      Follow-up Information    Marin Olp, MD. Schedule an appointment as soon as possible for a visit in 1 week(s).   Specialty:  Family Medicine Contact information: [place] [address] [phone]          Allergies  Allergen Reactions  . Atorvastatin     Intolerant to all statins   . Cyclobenzaprine Other (See Comments)    Bradycardia, hypotension  . Ezetimibe      INTOL to Zetia  . Rosuvastatin     intolerant to all statins  . Gadolinium Derivatives Nausea Only    Pt became very nauseous after gadolinium administration//lh  . Nicoderm [Nicotine] Rash    Unable to use the patches---caused severe rash to area placed    Consultations:  Radiation oncology, oncology   Procedures/Studies: Dg Chest 2 View  Result Date: 04/27/2018 CLINICAL DATA:  Low back pain 3 months.  No trauma. EXAM: CHEST - 2 VIEW COMPARISON:  03/23/2018 and thoracic spine 02/01/2018 FINDINGS: Right-sided PICC line has tip over the SVC. Lungs are adequately inflated with hazy  prominence of the perihilar markings suggesting interstitial edema. Small amount of posterior pleural fluid on the lateral film. Cardiomediastinal silhouette is within normal. Mild degenerate change of the spine. Mild T9 compression fracture new since 02/01/2018 but unchanged from the recent chest radiograph. IMPRESSION: Evidence of mild interstitial edema with small bilateral pleural effusions. Mild T9 compression fracture age indeterminate but new since 02/01/2018. Right-sided PICC line with tip over the SVC. Electronically Signed   By: Marin Olp M.D.   On: 04/27/2018 12:06   Dg Thoracic Spine 2 View  Result Date: 04/27/2018 CLINICAL DATA:  Back pain 3 months.  No trauma. EXAM: THORACIC SPINE 2 VIEWS  COMPARISON:  Chest x-ray 03/23/2018 and thoracic spine 02/01/2018 FINDINGS: Right-sided PICC line with tip over the SVC. Vertebral body alignment is normal. Pedicles are intact. There is mild spondylosis throughout the thoracic spine. There is mild loss of height of T9 new since 02/01/2018. Remainder of the exam is unchanged. IMPRESSION: Mild T9 compression fracture age indeterminate but new since 02/01/2018. Mild spondylosis of the thoracic spine. Electronically Signed   By: Marin Olp M.D.   On: 04/27/2018 12:03   Dg Lumbar Spine 2-3 Views  Result Date: 04/27/2018 CLINICAL DATA:  Low back pain 3 months.  No trauma. EXAM: LUMBAR SPINE - 2-3 VIEW COMPARISON:  CT 04/01/2018 FINDINGS: Subtle curvature convex left. Vertebral body heights are maintained. There is moderate spondylosis throughout the lumbar spine to include facet arthropathy. Significant multilevel disc disease at all levels of the lumbar spine. No acute compression fracture or subluxation. Calcified plaque over the abdominal aorta. Evidence of patient's known right superior pubic ramus/acetabular fracture. IMPRESSION: Moderate spondylosis of the lumbar spine with moderate multilevel disc disease. Known right acetabular/superior pubic ramus fracture. Electronically Signed   By: Marin Olp M.D.   On: 04/27/2018 11:59   Ct Chest W Contrast  Result Date: 04/29/2018 CLINICAL DATA:  77 year old female with history of metastatic disease to the spine. Unknown primary neoplasm. EXAM: CT CHEST, ABDOMEN, AND PELVIS WITH CONTRAST TECHNIQUE: Multidetector CT imaging of the chest, abdomen and pelvis was performed following the standard protocol during bolus administration of intravenous contrast. CONTRAST:  146mL OMNIPAQUE IOHEXOL 300 MG/ML  SOLN COMPARISON:  CT the abdomen and pelvis 04/01/2018. No prior chest CT. FINDINGS: CT CHEST FINDINGS Cardiovascular: Heart size is borderline enlarged. There is no significant pericardial fluid, thickening or  pericardial calcification. There is aortic atherosclerosis, as well as atherosclerosis of the great vessels of the mediastinum and the coronary arteries, including calcified atherosclerotic plaque in the left main, left anterior descending, left circumflex and right coronary arteries. Mediastinum/Nodes: Multiple prominent borderline enlarged and enlarged mediastinal and bilateral hilar lymph nodes are noted. The largest of these include 2.3 cm low right paratracheal lymph node, 2.1 cm subcarinal lymph node, 1.9 cm AP window lymph node and a 2.2 cm left hilar lymph node. Esophagus is unremarkable in appearance. No axillary lymphadenopathy. Lungs/Pleura: Small bilateral pleural effusions lying dependently with some associated passive subsegmental atelectasis in the lower lobes of the lungs bilaterally. There is a background of interlobular septal thickening, suggesting underlying pulmonary edema. In the lateral aspect of the left major fissure there is a macrolobulated mass which measures 3.3 x 1.8 x 3.3 cm (axial image 77 of series 6 and sagittal image 136 of series 8) which appears to extend into both the left upper and lower lobes. Musculoskeletal: Innumerable predominantly lytic lesions are noted throughout the visualized axial and appendicular skeleton, compatible with widespread metastatic disease  to the bones. There is an associated pathologic compression fracture of superior endplate of T9 with approximately 30% loss of anterior vertebral body height. CT ABDOMEN PELVIS FINDINGS Hepatobiliary: There are several hypovascular hepatic lesions in the liver measuring 3.8 x 2.7 cm in segment 8 (axial image 51 of series 2), 2.8 x 3.4 cm in segment 4B/5 (axial image 71 of series 2) and 1.5 x 1.5 cm in the central aspect of segment 4A (axial image 59 of series 2). No intra or extrahepatic biliary ductal dilatation. Gallbladder is moderately distended, but otherwise unremarkable in appearance. Pancreas: No pancreatic  mass. No pancreatic ductal dilatation. No pancreatic or peripancreatic fluid or inflammatory changes. Spleen: Unremarkable. Adrenals/Urinary Tract: Subcentimeter low-attenuation lesions in the lower pole of the right kidney are too small to definitively characterize, but are statistically likely to represent tiny cysts. Left kidney and bilateral adrenal glands are normal in appearance. No hydroureteronephrosis. Urinary bladder is normal in appearance. Stomach/Bowel: The appearance of the stomach is normal. There is no pathologic dilatation of small bowel or colon. Numerous colonic diverticulae are noted, particularly in the sigmoid colon, without surrounding inflammatory changes to suggest an acute diverticulitis at this time. The appendix is not confidently identified and may be surgically absent. Regardless, there are no inflammatory changes noted adjacent to the cecum to suggest the presence of an acute appendicitis at this time. Vascular/Lymphatic: Aortic atherosclerosis, without evidence of aneurysm or dissection in the abdominal or pelvic vasculature. No lymphadenopathy noted in the abdomen or pelvis. Reproductive: Uterus and ovaries are unremarkable in appearance. Other: No significant volume of ascites. No pneumoperitoneum. Musculoskeletal: Multiple small lytic lesions scattered throughout the visualized axial and appendicular skeleton, compatible with widespread metastatic disease to the bones. Nondisplaced healing fractures of the right superior and inferior pubic rami. Nondisplaced fractures through both sacral ala. IMPRESSION: 1. 3.3 x 1.8 x 3.3 cm mass in the lateral aspect of the left upper and lower lobes which appears to extend into both the left upper and lower lobes, with associated mediastinal and bilateral hilar lymphadenopathy, as detailed above, highly concerning for primary bronchogenic neoplasm. Further evaluation with PET-CT and/or biopsy is recommended in the near future for diagnostic and  staging purposes. 2. 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 3. Heart size is borderline enlarged, and there is evidence of mild interstitial pulmonary edema and small bilateral pleural effusions; imaging findings suggestive of congestive heart failure. 4. Aortic atherosclerosis, in addition to left main and 3 vessel coronary artery disease. Assessment for potential risk factor modification, dietary therapy or pharmacologic therapy may be warranted, if clinically indicated. 5. Colonic diverticulosis without evidence of acute diverticulitis at this time. 6. Additional incidental findings, as above. Aortic Atherosclerosis (ICD10-I70.0). Electronically Signed   By: Vinnie Langton M.D.   On: 04/29/2018 07:31   Mr Jeri Cos KX Contrast  Result Date: 05/02/2018 CLINICAL DATA:  Initial evaluation for small cell lung cancer, staging. EXAM: MRI HEAD WITHOUT AND WITH CONTRAST TECHNIQUE: Multiplanar, multiecho pulse sequences of the brain and surrounding structures were obtained without and with intravenous contrast. CONTRAST:  60 cc of Gadavist. COMPARISON:  Prior CT from 07/07/2016 FINDINGS: Brain: Moderately advanced cerebral atrophy with chronic small vessel ischemic disease. Superimposed remote left thalamic lacunar infarct noted. No abnormal foci of restricted diffusion to suggest acute or subacute ischemia. Gray-white matter differentiation maintained. No encephalomalacia to suggest chronic cortical infarction. No foci of susceptibility artifact to suggest acute or chronic intracranial hemorrhage. No mass lesion, midline shift  or mass effect. Ventricles normal size without hydrocephalus. No extra-axial fluid collection. No abnormal enhancement. Apparent punctate focus of enhancement within the subcortical white matter of the left frontal lobe seen on coronal post gadolinium sequence image 15 favored to be artifactual or possibly vascular in nature. No other evidence for intracranial  metastatic disease. Pituitary gland grossly within normal limits. Vascular: Major intracranial vascular flow voids maintained. Skull and upper cervical spine: Craniocervical junction normal. Multilevel cervical spondylolysis noted within the upper cervical spine without high-grade stenosis. There is abnormal T1 hypointense signal intensity seen involving the right lateral mass of C1 (series 4, image 8), indeterminate, but concerning for possible osseous metastasis. No other focal marrow replacing lesion identified. Sinuses/Orbits: Globes and orbital soft tissues within normal limits. Right maxillary sinus retention cyst. Paranasal sinuses are otherwise clear. No significant mastoid effusion. Inner ear structures grossly normal. Other: None. IMPRESSION: 1. Abnormal signal intensity involving the right lateral mass of C1, concerning for possible osseous metastasis. 2. No other MRI evidence for intracranial metastatic disease within the brain. 3. No other acute intracranial abnormality. 4. Moderately advanced cerebral atrophy with chronic small vessel ischemic disease. Electronically Signed   By: Jeannine Boga M.D.   On: 05/02/2018 21:39   Mr Thoracic Spine Wo Contrast  Addendum Date: 04/28/2018   ADDENDUM REPORT: 04/28/2018 16:32 ADDENDUM: These results were called by telephone at the time of interpretation on 04/28/2018 at 15:42 to Dr. Myrene Buddy , who verbally acknowledged these results. Electronically Signed   By: Staci Righter M.D.   On: 04/28/2018 16:32   Result Date: 04/28/2018 CLINICAL DATA:  Back pain. EXAM: MRI THORACIC SPINE WITHOUT CONTRAST TECHNIQUE: Multiplanar, multisequence MR imaging of the thoracic spine was performed. No intravenous contrast was administered. COMPARISON:  Plain films 04/27/2018. FINDINGS: Alignment:  Anatomic Vertebrae: Widespread metastatic disease to the thoracic spine, near complete replacement T1, T6, T7, T9 and T12. Pathologic fracture of T9, loss of  approximately 25-33% vertebral body height. Involvement of the pedicles and posterior elements at T6, extending to the spinous process. Partial vertebral body involvement with metastatic disease of T11, T2, and T5. Cord: Significant cord compression at T6, due to bulky dorsal epidural tumor, roughly 16 x 12 x 32 mm cross-section. No definite abnormal cord signal, but the patient is at risk for symptomatic cord compression. Slight posterior displacement of the posterior wall of T9 with epidural tumor ventrally at this level, which does not result in significant stenosis or impingement. Paraspinal and other soft tissues: BILATERAL pleural effusions, greater on the LEFT. Suspected LEFT hilar mass. Disc levels: Minor disc pathology at multiple levels, not contributory. IMPRESSION: Widespread metastatic disease involving numerous thoracic vertebrae including T1, T2, T5, T6, T7, T9, T11, and T12. Pathologic fracture T9. Bulky dorsal epidural tumor at T6 results in significant cord compression. Neurosurgical consultation may be warranted. BILATERAL pleural effusions, greater on the LEFT with suspected LEFT hilar mass. Recommend CT chest with contrast for further evaluation. Furthermore, given the heterogeneous appearance of the liver on noncontrast CT from 04/01/2018, consider CT abdomen and pelvis with contrast for additional staging. A call has been placed to the ordering provider. Electronically Signed: By: Staci Righter M.D. On: 04/28/2018 15:31   Ct Abdomen Pelvis W Contrast  Result Date: 04/29/2018 CLINICAL DATA:  77 year old female with history of metastatic disease to the spine. Unknown primary neoplasm. EXAM: CT CHEST, ABDOMEN, AND PELVIS WITH CONTRAST TECHNIQUE: Multidetector CT imaging of the chest, abdomen and pelvis was performed following the standard protocol  during bolus administration of intravenous contrast. CONTRAST:  161mL OMNIPAQUE IOHEXOL 300 MG/ML  SOLN COMPARISON:  CT the abdomen and pelvis  04/01/2018. No prior chest CT. FINDINGS: CT CHEST FINDINGS Cardiovascular: Heart size is borderline enlarged. There is no significant pericardial fluid, thickening or pericardial calcification. There is aortic atherosclerosis, as well as atherosclerosis of the great vessels of the mediastinum and the coronary arteries, including calcified atherosclerotic plaque in the left main, left anterior descending, left circumflex and right coronary arteries. Mediastinum/Nodes: Multiple prominent borderline enlarged and enlarged mediastinal and bilateral hilar lymph nodes are noted. The largest of these include 2.3 cm low right paratracheal lymph node, 2.1 cm subcarinal lymph node, 1.9 cm AP window lymph node and a 2.2 cm left hilar lymph node. Esophagus is unremarkable in appearance. No axillary lymphadenopathy. Lungs/Pleura: Small bilateral pleural effusions lying dependently with some associated passive subsegmental atelectasis in the lower lobes of the lungs bilaterally. There is a background of interlobular septal thickening, suggesting underlying pulmonary edema. In the lateral aspect of the left major fissure there is a macrolobulated mass which measures 3.3 x 1.8 x 3.3 cm (axial image 77 of series 6 and sagittal image 136 of series 8) which appears to extend into both the left upper and lower lobes. Musculoskeletal: Innumerable predominantly lytic lesions are noted throughout the visualized axial and appendicular skeleton, compatible with widespread metastatic disease to the bones. There is an associated pathologic compression fracture of superior endplate of T9 with approximately 30% loss of anterior vertebral body height. CT ABDOMEN PELVIS FINDINGS Hepatobiliary: There are several hypovascular hepatic lesions in the liver measuring 3.8 x 2.7 cm in segment 8 (axial image 51 of series 2), 2.8 x 3.4 cm in segment 4B/5 (axial image 71 of series 2) and 1.5 x 1.5 cm in the central aspect of segment 4A (axial image 59 of  series 2). No intra or extrahepatic biliary ductal dilatation. Gallbladder is moderately distended, but otherwise unremarkable in appearance. Pancreas: No pancreatic mass. No pancreatic ductal dilatation. No pancreatic or peripancreatic fluid or inflammatory changes. Spleen: Unremarkable. Adrenals/Urinary Tract: Subcentimeter low-attenuation lesions in the lower pole of the right kidney are too small to definitively characterize, but are statistically likely to represent tiny cysts. Left kidney and bilateral adrenal glands are normal in appearance. No hydroureteronephrosis. Urinary bladder is normal in appearance. Stomach/Bowel: The appearance of the stomach is normal. There is no pathologic dilatation of small bowel or colon. Numerous colonic diverticulae are noted, particularly in the sigmoid colon, without surrounding inflammatory changes to suggest an acute diverticulitis at this time. The appendix is not confidently identified and may be surgically absent. Regardless, there are no inflammatory changes noted adjacent to the cecum to suggest the presence of an acute appendicitis at this time. Vascular/Lymphatic: Aortic atherosclerosis, without evidence of aneurysm or dissection in the abdominal or pelvic vasculature. No lymphadenopathy noted in the abdomen or pelvis. Reproductive: Uterus and ovaries are unremarkable in appearance. Other: No significant volume of ascites. No pneumoperitoneum. Musculoskeletal: Multiple small lytic lesions scattered throughout the visualized axial and appendicular skeleton, compatible with widespread metastatic disease to the bones. Nondisplaced healing fractures of the right superior and inferior pubic rami. Nondisplaced fractures through both sacral ala. IMPRESSION: 1. 3.3 x 1.8 x 3.3 cm mass in the lateral aspect of the left upper and lower lobes which appears to extend into both the left upper and lower lobes, with associated mediastinal and bilateral hilar lymphadenopathy, as  detailed above, highly concerning for primary bronchogenic neoplasm. Further  evaluation with PET-CT and/or biopsy is recommended in the near future for diagnostic and staging purposes. 2. 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 3. Heart size is borderline enlarged, and there is evidence of mild interstitial pulmonary edema and small bilateral pleural effusions; imaging findings suggestive of congestive heart failure. 4. Aortic atherosclerosis, in addition to left main and 3 vessel coronary artery disease. Assessment for potential risk factor modification, dietary therapy or pharmacologic therapy may be warranted, if clinically indicated. 5. Colonic diverticulosis without evidence of acute diverticulitis at this time. 6. Additional incidental findings, as above. Aortic Atherosclerosis (ICD10-I70.0). Electronically Signed   By: Vinnie Langton M.D.   On: 04/29/2018 07:31   US Biopsy (liver)  Result Date: 04/29/2018 INDICATION: 77 year old with chest lymphadenopathy, bone lesions and liver lesions. Findings are suspicious for metastatic disease and tissue diagnosis is needed. EXAM: ULTRASOUND-GUIDED LIVER LESION BIOPSY MEDICATIONS: None. ANESTHESIA/SEDATION: Moderate (conscious) sedation was employed during this procedure. A total of Versed 1 mg and Fentanyl 50 mcg was administered intravenously. Moderate Sedation Time: 15 minutes. The patient's level of consciousness and vital signs were monitored continuously by radiology nursing throughout the procedure under my direct supervision. FLUOROSCOPY TIME:  None COMPLICATIONS: None immediate. PROCEDURE: Informed written consent was obtained from the patient after a thorough discussion of the procedural risks, benefits and alternatives. All questions were addressed. A timeout was performed prior to the initiation of the procedure. Ultrasound was used to evaluate the liver. A very subtle isoechoic lesion was identified in the segment 4B region  adjacent to the gallbladder. This appeared to correspond with the recent CT findings. The right abdomen was prepped and draped in sterile fashion. Maximal barrier sterile technique was utilized including mask, sterile gowns, sterile gloves, sterile drape, hand hygiene and skin antiseptic. Skin was anesthetized with 1% lidocaine. 17 gauge needle directed into the subtle lesion with ultrasound guidance. Four core biopsies obtained with an 18 gauge device. Specimens placed in formalin. Needle was removed without complication. FINDINGS: Very subtle isoechoic lesion adjacent to the gallbladder. This area did correspond with the recent CT findings. Four small core biopsies were obtained. No significant bleeding or hematoma formation after the core biopsies. IMPRESSION: Ultrasound-guided core biopsies of the subtle hepatic lesion. Visualization of this lesion was very difficult and a false negative biopsy is a possibility. Electronically Signed   By: Markus Daft M.D.   On: 04/29/2018 18:47      Subjective: Patient seen and examined the bedside this morning.  No active issues.  Hemodynamically  stable.  Feels comfortable. Back pain well managed. Stable for discharge today.  Discharge Exam: Vitals:   05/10/18 2130 05/11/18 0634  BP: 108/70 111/67  Pulse: 80 69  Resp: 17 17  Temp: 98.2 F (36.8 C) 98.3 F (36.8 C)  SpO2: 96% 98%   Vitals:   05/10/18 0631 05/10/18 1505 05/10/18 2130 05/11/18 0634  BP: 132/82 117/70 108/70 111/67  Pulse: 80 77 80 69  Resp: 18 16 17 17   Temp: 97.8 F (36.6 C) 98 F (36.7 C) 98.2 F (36.8 C) 98.3 F (36.8 C)  TempSrc: Oral Oral Oral Oral  SpO2: 97% 94% 96% 98%  Weight:      Height:        General: Pt is alert, awake, not in acute distress, elderly debilitated female Cardiovascular: RRR, S1/S2 +, no rubs, no gallops Respiratory: CTA bilaterally, no wheezing, no rhonchi Abdominal: Soft, NT, ND, bowel sounds + Extremities: no edema, no cyanosis  The  results of significant diagnostics from this hospitalization (including imaging, microbiology, ancillary and laboratory) are listed below for reference.     Microbiology: No results found for this or any previous visit (from the past 240 hour(s)).   Labs: BNP (last 3 results) Recent Labs    04/28/18 0549  BNP 852.7*   Basic Metabolic Panel: Recent Labs  Lab 05/07/18 0550 05/08/18 0549 05/09/18 0644 05/10/18 0629 05/11/18 0415  NA 131* 130* 127* 132* 133*  K 4.9 5.1 5.4* 4.9 4.9  CL 97* 98 94* 100 100  CO2 28 25 24 25 26   GLUCOSE 119* 116* 121* 120* 134*  BUN 38* 38* 43* 41* 41*  CREATININE 0.78 0.61 0.62 0.67 0.69  CALCIUM 8.8* 9.0 8.4* 8.2* 8.1*   Liver Function Tests: No results for input(s): AST, ALT, ALKPHOS, BILITOT, PROT, ALBUMIN in the last 168 hours. No results for input(s): LIPASE, AMYLASE in the last 168 hours. No results for input(s): AMMONIA in the last 168 hours. CBC: Recent Labs  Lab 05/05/18 0523 05/07/18 0550 05/08/18 0549 05/10/18 0629  WBC 19.6* 21.7* 19.4* 16.1*  NEUTROABS  --  19.8* 18.1* 15.2*  HGB 10.9* 10.6* 10.7* 10.2*  HCT 33.8* 33.6* 33.9* 31.5*  MCV 86.9 90.1 88.1 88.0  PLT 443* 385 426* 395   Cardiac Enzymes: No results for input(s): CKTOTAL, CKMB, CKMBINDEX, TROPONINI in the last 168 hours. BNP: Invalid input(s): POCBNP CBG: No results for input(s): GLUCAP in the last 168 hours. D-Dimer No results for input(s): DDIMER in the last 72 hours. Hgb A1c No results for input(s): HGBA1C in the last 72 hours. Lipid Profile No results for input(s): CHOL, HDL, LDLCALC, TRIG, CHOLHDL, LDLDIRECT in the last 72 hours. Thyroid function studies No results for input(s): TSH, T4TOTAL, T3FREE, THYROIDAB in the last 72 hours.  Invalid input(s): FREET3 Anemia work up No results for input(s): VITAMINB12, FOLATE, FERRITIN, TIBC, IRON, RETICCTPCT in the last 72 hours. Urinalysis    Component Value Date/Time   COLORURINE YELLOW 04/25/2018 1922    APPEARANCEUR CLEAR 04/25/2018 1922   LABSPEC 1.010 04/25/2018 [place] 7.0 04/25/2018 1922   GLUCOSEU NEGATIVE 04/25/2018 1922   GLUCOSEU NEGATIVE 01/28/2018 1055   HGBUR NEGATIVE 04/25/2018 1922   BILIRUBINUR NEGATIVE 04/25/2018 1922   BILIRUBINUR Negative 04/25/2018 1432   KETONESUR 5 (A) 04/25/2018 1922   PROTEINUR NEGATIVE 04/25/2018 1922   UROBILINOGEN 0.2 04/25/2018 1432   UROBILINOGEN 0.2 01/28/2018 1055   NITRITE NEGATIVE 04/25/2018 1922   LEUKOCYTESUR NEGATIVE 04/25/2018 1922   Sepsis Labs Invalid input(s): PROCALCITONIN,  WBC,  LACTICIDVEN Microbiology No results found for this or any previous visit (from the past 240 hour(s)).  Please note: You were cared for by a hospitalist during your hospital stay. Once you are discharged, your primary care physician will handle any further medical issues. Please note that NO REFILLS for any discharge medications will be authorized once you are discharged, as it is imperative that you return to your primary care physician (or establish a relationship with a primary care physician if you do not have one) for your post hospital discharge needs so that they can reassess your need for medications and monitor your lab values.    Time coordinating discharge: 40 minutes  SIGNED:   Shelly Coss, MD  [facility] 05/11/2018, 10:29 AM Pager [phone]  If 7PM-7AM, please contact night-coverage [email or web address] Password [email or web address]

## 2018-05-11 NOTE — Progress Notes (Signed)
Attempted to call report to [facility], on hold , RN did not pick up. Will attempt later.

## 2018-05-13 ENCOUNTER — Encounter: Payer: Self-pay | Admitting: *Deleted

## 2018-05-13 ENCOUNTER — Telehealth: Payer: Self-pay | Admitting: Internal Medicine

## 2018-05-13 ENCOUNTER — Ambulatory Visit
Admission: RE | Admit: 2018-05-13 | Discharge: 2018-05-13 | Disposition: A | Discharge status: Home / Self Care | Payer: Medicare Other | Source: Ambulatory Visit | Attending: Radiation Oncology | Admitting: Radiation Oncology

## 2018-05-13 ENCOUNTER — Encounter: Payer: Self-pay | Admitting: Radiation Oncology

## 2018-05-13 DIAGNOSIS — C7951 Secondary malignant neoplasm of bone: Secondary | ICD-10-CM | POA: Diagnosis not present

## 2018-05-13 DIAGNOSIS — G9529 Other cord compression: Secondary | ICD-10-CM | POA: Diagnosis not present

## 2018-05-13 DIAGNOSIS — C3432 Malignant neoplasm of lower lobe, left bronchus or lung: Secondary | ICD-10-CM | POA: Diagnosis not present

## 2018-05-13 DIAGNOSIS — S22070D Wedge compression fracture of T9-T10 vertebra, subsequent encounter for fracture with routine healing: Secondary | ICD-10-CM | POA: Diagnosis not present

## 2018-05-13 DIAGNOSIS — E871 Hypo-osmolality and hyponatremia: Secondary | ICD-10-CM | POA: Diagnosis not present

## 2018-05-13 NOTE — Progress Notes (Signed)
Spoke with [place] at [facility] to confirm the patient would be present for her 1245 radiation treatment. Informed Ciarah, RT on L3 that she would be per [place].

## 2018-05-13 NOTE — Progress Notes (Signed)
Oncology Nurse Navigator Documentation  Oncology Nurse Navigator Flowsheets 05/13/2018  Navigator Location [facility]  Navigator Encounter Type Other/per Dr. Julien Nordmann, I updated scheduling team to call and schedule [name] [name] to have follow up next with him or APP and to schedule weekly CBC and CMet.  Treatment Phase Treatment  Barriers/Navigation Needs Coordination of Care  Interventions Coordination of Care  Coordination of Care Other  Acuity Level 2  Time Spent with Patient 30

## 2018-05-13 NOTE — Telephone Encounter (Signed)
Scheduled appt per 11/25 sch message - pt is aware of appt date and time   

## 2018-05-13 NOTE — Telephone Encounter (Signed)
Patient's son stopped by today and stated patient saw Dr. Julien Nordmann and had first treatment in the hospital. Per son patient has been discharged and he is wanting to know what the next steps will be. They will be back tomorrow for xrt - Son will stop by then.

## 2018-05-13 NOTE — Telephone Encounter (Signed)
Pt does nota have any future appts with med oncology.

## 2018-05-14 ENCOUNTER — Inpatient Hospital Stay (HOSPITAL_BASED_OUTPATIENT_CLINIC_OR_DEPARTMENT_OTHER): Payer: Medicare Other | Admitting: Internal Medicine

## 2018-05-14 ENCOUNTER — Encounter: Payer: Self-pay | Admitting: Internal Medicine

## 2018-05-14 ENCOUNTER — Ambulatory Visit
Admission: RE | Admit: 2018-05-14 | Discharge: 2018-05-14 | Disposition: A | Discharge status: Home / Self Care | Payer: Medicare Other | Source: Ambulatory Visit | Attending: Radiation Oncology | Admitting: Radiation Oncology

## 2018-05-14 ENCOUNTER — Inpatient Hospital Stay: Payer: Medicare Other

## 2018-05-14 VITALS — BP 119/76 | HR 71 | Temp 98.5°F | Resp 17 | Ht 67.0 in

## 2018-05-14 DIAGNOSIS — Z7189 Other specified counseling: Secondary | ICD-10-CM

## 2018-05-14 DIAGNOSIS — C3432 Malignant neoplasm of lower lobe, left bronchus or lung: Secondary | ICD-10-CM | POA: Diagnosis not present

## 2018-05-14 DIAGNOSIS — T451X5A Adverse effect of antineoplastic and immunosuppressive drugs, initial encounter: Secondary | ICD-10-CM | POA: Diagnosis not present

## 2018-05-14 DIAGNOSIS — Z5111 Encounter for antineoplastic chemotherapy: Secondary | ICD-10-CM | POA: Insufficient documentation

## 2018-05-14 DIAGNOSIS — D701 Agranulocytosis secondary to cancer chemotherapy: Secondary | ICD-10-CM | POA: Diagnosis not present

## 2018-05-14 DIAGNOSIS — C7951 Secondary malignant neoplasm of bone: Secondary | ICD-10-CM

## 2018-05-14 DIAGNOSIS — G9529 Other cord compression: Secondary | ICD-10-CM | POA: Diagnosis not present

## 2018-05-14 DIAGNOSIS — J449 Chronic obstructive pulmonary disease, unspecified: Secondary | ICD-10-CM

## 2018-05-14 LAB — CBC WITH DIFFERENTIAL (CANCER CENTER ONLY)
ABS IMMATURE GRANULOCYTES: 0 10*3/uL (ref 0.00–0.07)
BASOS ABS: 0 10*3/uL (ref 0.0–0.1)
BASOS PCT: 0 %
EOS ABS: 0 10*3/uL (ref 0.0–0.5)
Eosinophils Relative: 2 %
HCT: 29.6 % — ABNORMAL LOW (ref 36.0–46.0)
Hemoglobin: 9.8 g/dL — ABNORMAL LOW (ref 12.0–15.0)
IMMATURE GRANULOCYTES: 0 %
Lymphocytes Relative: 56 %
Lymphs Abs: 0.3 10*3/uL — ABNORMAL LOW (ref 0.7–4.0)
MCH: 28.2 pg (ref 26.0–34.0)
MCHC: 33.1 g/dL (ref 30.0–36.0)
MCV: 85.1 fL (ref 80.0–100.0)
MONOS PCT: 2 %
Monocytes Absolute: 0 10*3/uL — ABNORMAL LOW (ref 0.1–1.0)
NEUTROS ABS: 0.2 10*3/uL — AB (ref 1.7–7.7)
NEUTROS PCT: 40 %
NRBC: 0 % (ref 0.0–0.2)
PLATELETS: 256 10*3/uL (ref 150–400)
RBC: 3.48 MIL/uL — AB (ref 3.87–5.11)
RDW: 15 % (ref 11.5–15.5)
WBC Count: 0.5 10*3/uL — CL (ref 4.0–10.5)

## 2018-05-14 LAB — CMP (CANCER CENTER ONLY)
ALT: 68 U/L — AB (ref 0–44)
AST: 32 U/L (ref 15–41)
Albumin: 2.4 g/dL — ABNORMAL LOW (ref 3.5–5.0)
Alkaline Phosphatase: 103 U/L (ref 38–126)
Anion gap: 8 (ref 5–15)
BUN: 22 mg/dL (ref 8–23)
CHLORIDE: 96 mmol/L — AB (ref 98–111)
CO2: 24 mmol/L (ref 22–32)
CREATININE: 0.57 mg/dL (ref 0.44–1.00)
Calcium: 7.8 mg/dL — ABNORMAL LOW (ref 8.9–10.3)
Glucose, Bld: 99 mg/dL (ref 70–99)
POTASSIUM: 4.4 mmol/L (ref 3.5–5.1)
Sodium: 128 mmol/L — ABNORMAL LOW (ref 135–145)
Total Bilirubin: 0.5 mg/dL (ref 0.3–1.2)
Total Protein: 5.2 g/dL — ABNORMAL LOW (ref 6.5–8.1)

## 2018-05-14 MED ORDER — PEGFILGRASTIM-CBQV 6 MG/0.6ML ~~LOC~~ SOSY
PREFILLED_SYRINGE | SUBCUTANEOUS | Status: AC
  Filled 2018-05-14: qty 0.6

## 2018-05-14 MED ORDER — PEGFILGRASTIM-CBQV 6 MG/0.6ML ~~LOC~~ SOSY
6.0000 mg | PREFILLED_SYRINGE | Freq: Once | SUBCUTANEOUS | Status: AC
  Administered 2018-05-14: 6 mg via SUBCUTANEOUS

## 2018-05-14 NOTE — Progress Notes (Signed)
[place] [phone]) [phone]   [phone]  OFFICE PROGRESS NOTE  Marin Olp, MD [place] [address]  DIAGNOSIS: Extensive stage (T2 a, N3, M1c) small cell lung cancer diagnosed in November 2019 and presented with left lower lobe lung mass in addition to bilateral hilar and mediastinal lymphadenopathy as well as metastatic disease to bones and liver.  PRIOR THERAPY: Palliative radiotherapy to the metastatic bone disease at T6 with cord compression under the care of Dr. Tammi Klippel.  CURRENT THERAPY: Systemic chemotherapy with carboplatin for AUC of 5 on day 1, etoposide 100 mg/M2 on days 1, 2 and 3 with Neulasta support as well as Tecentriq (Atezolizumab) 1200 mg IV every 3 weeks.  Status post 1 cycle.  First dose was given on May 07, 2018 at [facility].  INTERVAL HISTORY: [name] [name] 77 y.o. female returns to the clinic today for follow-up visit accompanied by her husband.  The patient is currently a resident of skilled nursing facility after discharge from the hospital.  She is feeling much better except for the weakness in her lower extremities after the bone metastasis in the thoracic spine.  She is currently undergoing palliative radiotherapy to that area.  The patient missed her appointment for the Neulasta injection few days ago.  She denied having any current fever or chills.  She has no nausea, vomiting, diarrhea or constipation.  She has no headache or visual changes.  She is here today for evaluation and repeat blood work.  MEDICAL HISTORY: Past Medical History:  Diagnosis Date  . Asymptomatic varicose veins   . Cataract    beginnings  . Cigarette smoker   . COPD (chronic obstructive pulmonary disease) ([place])   . Diverticulosis of colon (without mention of hemorrhage)   . Headache(784.0)   . Hearing loss    wears hearing aides  . Lumbar back pain   . Pure hypercholesterolemia   . Stress disorder,  acute   . Varicose veins     ALLERGIES:  is allergic to atorvastatin; cyclobenzaprine; ezetimibe; rosuvastatin; gadolinium derivatives; and nicoderm [nicotine].  MEDICATIONS:  Current Outpatient Medications  Medication Sig Dispense Refill  . ALPRAZolam (XANAX) 0.5 MG tablet Take 1 tablet (0.5 mg total) by mouth 2 (two) times daily as needed for anxiety or sleep. 15 tablet 0  . dexamethasone (DECADRON) 4 MG tablet Take 1 tablet (4 mg total) by mouth every 8 (eight) hours. 30 tablet 0  . feeding supplement, ENSURE ENLIVE, (ENSURE ENLIVE) LIQD Take 237 mLs by mouth 2 (two) times daily between meals. 237 mL 12  . fentaNYL (DURAGESIC - DOSED MCG/HR) 25 MCG/HR patch Place 1 patch (25 mcg total) onto the skin every 3 (three) days. 5 patch 0  . levothyroxine (SYNTHROID, LEVOTHROID) 75 MCG tablet TAKE 1 TABLET(75 MCG) BY MOUTH DAILY (Patient taking differently: Take 75 mcg by mouth daily before breakfast. ) 90 tablet 1  . lidocaine (XYLOCAINE) 2 % solution Use as directed 15 mLs in the mouth or throat every 3 (three) hours as needed for mouth pain (mouth ulcers). 100 mL 0  . morphine (MS CONTIN) 15 MG 12 hr tablet Take 1 tablet (15 mg total) by mouth every 12 (twelve) hours. 20 tablet 0  . nicotine polacrilex (NICORETTE) 2 MG gum Take 1 each (2 mg total) by mouth as needed for smoking cessation. (Patient not taking: Reported on 04/25/2018) 100 tablet 0  . nystatin (MYCOSTATIN) 100000 UNIT/ML suspension Take  5 mLs (500,000 Units total) by mouth 4 (four) times daily. 60 mL 0  . omeprazole (PRILOSEC) 20 MG capsule Take 1 capsule (20 mg total) by mouth daily. 30 capsule 3  . ondansetron (ZOFRAN-ODT) 4 MG disintegrating tablet Take 1 tablet (4 mg total) by mouth every 8 (eight) hours as needed for nausea or vomiting. 20 tablet 0  . polyethylene glycol (MIRALAX / GLYCOLAX) packet Take 17 g by mouth daily. 14 each 0  . senna (SENOKOT) 8.6 MG TABS tablet Take 1 tablet (8.6 mg total) by mouth 2 (two) times daily.  120 each 0   No current facility-administered medications for this visit.     SURGICAL HISTORY:  Past Surgical History:  Procedure Laterality Date  . COLONOSCOPY  04-22-2004  . LUMBAR SPINE SURGERY     [phone]  . varicose vein treatment     at [facility]    REVIEW OF SYSTEMS:  Constitutional: positive for fatigue Eyes: negative Ears, nose, mouth, throat, and face: negative Respiratory: negative Cardiovascular: negative Gastrointestinal: negative Genitourinary:negative Integument/breast: negative Hematologic/lymphatic: negative Musculoskeletal:positive for muscle weakness Neurological: negative Behavioral/Psych: negative Endocrine: negative Allergic/Immunologic: negative   PHYSICAL EXAMINATION: General appearance: alert, cooperative, fatigued and no distress Head: Normocephalic, without obvious abnormality, atraumatic Neck: no adenopathy, no JVD, supple, symmetrical, trachea midline and thyroid not enlarged, symmetric, no tenderness/mass/nodules Lymph nodes: Cervical, supraclavicular, and axillary nodes normal. Resp: clear to auscultation bilaterally Back: symmetric, no curvature. ROM normal. No CVA tenderness. Cardio: regular rate and rhythm, S1, S2 normal, no murmur, click, rub or gallop GI: soft, non-tender; bowel sounds normal; no masses,  no organomegaly Extremities: extremities normal, atraumatic, no cyanosis or edema Neurologic: Motor: 2/5 muscle strength in the lower extremities.  ECOG PERFORMANCE STATUS: 1 - Symptomatic but completely ambulatory  Blood pressure 119/76, pulse 71, temperature 98.5 F (36.9 C), temperature source Oral, resp. rate 17, height 5\' 7"  (1.702 m), SpO2 96 %.  LABORATORY DATA: Lab Results  Component Value Date   WBC 0.5 (LL) 05/14/2018   HGB 9.8 (L) 05/14/2018   HCT 29.6 (L) 05/14/2018   MCV 85.1 05/14/2018   PLT 256 05/14/2018      Chemistry      Component Value Date/Time   NA 133 (L) 05/11/2018 0415   NA 128 (A)  04/19/2018   K 4.9 05/11/2018 0415   CL 100 05/11/2018 0415   CO2 26 05/11/2018 0415   BUN 41 (H) 05/11/2018 0415   BUN 11 04/19/2018   CREATININE 0.69 05/11/2018 0415   CREATININE 0.71 03/22/2018 1521   GLU 115 04/19/2018      Component Value Date/Time   CALCIUM 8.1 (L) 05/11/2018 0415   ALKPHOS 173 (H) 04/25/2018 1402   AST 40 (H) 04/25/2018 1402   ALT 15 04/25/2018 1402   BILITOT 0.4 04/25/2018 1402       RADIOGRAPHIC STUDIES: Dg Chest 2 View  Result Date: 04/27/2018 CLINICAL DATA:  Low back pain 3 months.  No trauma. EXAM: CHEST - 2 VIEW COMPARISON:  03/23/2018 and thoracic spine 02/01/2018 FINDINGS: Right-sided PICC line has tip over the SVC. Lungs are adequately inflated with hazy prominence of the perihilar markings suggesting interstitial edema. Small amount of posterior pleural fluid on the lateral film. Cardiomediastinal silhouette is within normal. Mild degenerate change of the spine. Mild T9 compression fracture new since 02/01/2018 but unchanged from the recent chest radiograph. IMPRESSION: Evidence of mild interstitial edema with small bilateral pleural effusions. Mild T9 compression fracture age indeterminate but new since  02/01/2018. Right-sided PICC line with tip over the SVC. Electronically Signed   By: Marin Olp M.D.   On: 04/27/2018 12:06   Dg Thoracic Spine 2 View  Result Date: 04/27/2018 CLINICAL DATA:  Back pain 3 months.  No trauma. EXAM: THORACIC SPINE 2 VIEWS COMPARISON:  Chest x-ray 03/23/2018 and thoracic spine 02/01/2018 FINDINGS: Right-sided PICC line with tip over the SVC. Vertebral body alignment is normal. Pedicles are intact. There is mild spondylosis throughout the thoracic spine. There is mild loss of height of T9 new since 02/01/2018. Remainder of the exam is unchanged. IMPRESSION: Mild T9 compression fracture age indeterminate but new since 02/01/2018. Mild spondylosis of the thoracic spine. Electronically Signed   By: Marin Olp M.D.   On:  04/27/2018 12:03   Dg Lumbar Spine 2-3 Views  Result Date: 04/27/2018 CLINICAL DATA:  Low back pain 3 months.  No trauma. EXAM: LUMBAR SPINE - 2-3 VIEW COMPARISON:  CT 04/01/2018 FINDINGS: Subtle curvature convex left. Vertebral body heights are maintained. There is moderate spondylosis throughout the lumbar spine to include facet arthropathy. Significant multilevel disc disease at all levels of the lumbar spine. No acute compression fracture or subluxation. Calcified plaque over the abdominal aorta. Evidence of patient's known right superior pubic ramus/acetabular fracture. IMPRESSION: Moderate spondylosis of the lumbar spine with moderate multilevel disc disease. Known right acetabular/superior pubic ramus fracture. Electronically Signed   By: Marin Olp M.D.   On: 04/27/2018 11:59   Ct Chest W Contrast  Result Date: 04/29/2018 CLINICAL DATA:  77 year old female with history of metastatic disease to the spine. Unknown primary neoplasm. EXAM: CT CHEST, ABDOMEN, AND PELVIS WITH CONTRAST TECHNIQUE: Multidetector CT imaging of the chest, abdomen and pelvis was performed following the standard protocol during bolus administration of intravenous contrast. CONTRAST:  124mL OMNIPAQUE IOHEXOL 300 MG/ML  SOLN COMPARISON:  CT the abdomen and pelvis 04/01/2018. No prior chest CT. FINDINGS: CT CHEST FINDINGS Cardiovascular: Heart size is borderline enlarged. There is no significant pericardial fluid, thickening or pericardial calcification. There is aortic atherosclerosis, as well as atherosclerosis of the great vessels of the mediastinum and the coronary arteries, including calcified atherosclerotic plaque in the left main, left anterior descending, left circumflex and right coronary arteries. Mediastinum/Nodes: Multiple prominent borderline enlarged and enlarged mediastinal and bilateral hilar lymph nodes are noted. The largest of these include 2.3 cm low right paratracheal lymph node, 2.1 cm subcarinal lymph  node, 1.9 cm AP window lymph node and a 2.2 cm left hilar lymph node. Esophagus is unremarkable in appearance. No axillary lymphadenopathy. Lungs/Pleura: Small bilateral pleural effusions lying dependently with some associated passive subsegmental atelectasis in the lower lobes of the lungs bilaterally. There is a background of interlobular septal thickening, suggesting underlying pulmonary edema. In the lateral aspect of the left major fissure there is a macrolobulated mass which measures 3.3 x 1.8 x 3.3 cm (axial image 77 of series 6 and sagittal image 136 of series 8) which appears to extend into both the left upper and lower lobes. Musculoskeletal: Innumerable predominantly lytic lesions are noted throughout the visualized axial and appendicular skeleton, compatible with widespread metastatic disease to the bones. There is an associated pathologic compression fracture of superior endplate of T9 with approximately 30% loss of anterior vertebral body height. CT ABDOMEN PELVIS FINDINGS Hepatobiliary: There are several hypovascular hepatic lesions in the liver measuring 3.8 x 2.7 cm in segment 8 (axial image 51 of series 2), 2.8 x 3.4 cm in segment 4B/5 (axial image 71 of  series 2) and 1.5 x 1.5 cm in the central aspect of segment 4A (axial image 59 of series 2). No intra or extrahepatic biliary ductal dilatation. Gallbladder is moderately distended, but otherwise unremarkable in appearance. Pancreas: No pancreatic mass. No pancreatic ductal dilatation. No pancreatic or peripancreatic fluid or inflammatory changes. Spleen: Unremarkable. Adrenals/Urinary Tract: Subcentimeter low-attenuation lesions in the lower pole of the right kidney are too small to definitively characterize, but are statistically likely to represent tiny cysts. Left kidney and bilateral adrenal glands are normal in appearance. No hydroureteronephrosis. Urinary bladder is normal in appearance. Stomach/Bowel: The appearance of the stomach is  normal. There is no pathologic dilatation of small bowel or colon. Numerous colonic diverticulae are noted, particularly in the sigmoid colon, without surrounding inflammatory changes to suggest an acute diverticulitis at this time. The appendix is not confidently identified and may be surgically absent. Regardless, there are no inflammatory changes noted adjacent to the cecum to suggest the presence of an acute appendicitis at this time. Vascular/Lymphatic: Aortic atherosclerosis, without evidence of aneurysm or dissection in the abdominal or pelvic vasculature. No lymphadenopathy noted in the abdomen or pelvis. Reproductive: Uterus and ovaries are unremarkable in appearance. Other: No significant volume of ascites. No pneumoperitoneum. Musculoskeletal: Multiple small lytic lesions scattered throughout the visualized axial and appendicular skeleton, compatible with widespread metastatic disease to the bones. Nondisplaced healing fractures of the right superior and inferior pubic rami. Nondisplaced fractures through both sacral ala. IMPRESSION: 1. 3.3 x 1.8 x 3.3 cm mass in the lateral aspect of the left upper and lower lobes which appears to extend into both the left upper and lower lobes, with associated mediastinal and bilateral hilar lymphadenopathy, as detailed above, highly concerning for primary bronchogenic neoplasm. Further evaluation with PET-CT and/or biopsy is recommended in the near future for diagnostic and staging purposes. 2. 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 3. Heart size is borderline enlarged, and there is evidence of mild interstitial pulmonary edema and small bilateral pleural effusions; imaging findings suggestive of congestive heart failure. 4. Aortic atherosclerosis, in addition to left main and 3 vessel coronary artery disease. Assessment for potential risk factor modification, dietary therapy or pharmacologic therapy may be warranted, if clinically indicated.  5. Colonic diverticulosis without evidence of acute diverticulitis at this time. 6. Additional incidental findings, as above. Aortic Atherosclerosis (ICD10-I70.0). Electronically Signed   By: Vinnie Langton M.D.   On: 04/29/2018 07:31   Mr Jeri Cos CH Contrast  Result Date: 05/02/2018 CLINICAL DATA:  Initial evaluation for small cell lung cancer, staging. EXAM: MRI HEAD WITHOUT AND WITH CONTRAST TECHNIQUE: Multiplanar, multiecho pulse sequences of the brain and surrounding structures were obtained without and with intravenous contrast. CONTRAST:  60 cc of Gadavist. COMPARISON:  Prior CT from 07/07/2016 FINDINGS: Brain: Moderately advanced cerebral atrophy with chronic small vessel ischemic disease. Superimposed remote left thalamic lacunar infarct noted. No abnormal foci of restricted diffusion to suggest acute or subacute ischemia. Gray-white matter differentiation maintained. No encephalomalacia to suggest chronic cortical infarction. No foci of susceptibility artifact to suggest acute or chronic intracranial hemorrhage. No mass lesion, midline shift or mass effect. Ventricles normal size without hydrocephalus. No extra-axial fluid collection. No abnormal enhancement. Apparent punctate focus of enhancement within the subcortical white matter of the left frontal lobe seen on coronal post gadolinium sequence image 15 favored to be artifactual or possibly vascular in nature. No other evidence for intracranial metastatic disease. Pituitary gland grossly within normal limits. Vascular: Major intracranial  vascular flow voids maintained. Skull and upper cervical spine: Craniocervical junction normal. Multilevel cervical spondylolysis noted within the upper cervical spine without high-grade stenosis. There is abnormal T1 hypointense signal intensity seen involving the right lateral mass of C1 (series 4, image 8), indeterminate, but concerning for possible osseous metastasis. No other focal marrow replacing lesion  identified. Sinuses/Orbits: Globes and orbital soft tissues within normal limits. Right maxillary sinus retention cyst. Paranasal sinuses are otherwise clear. No significant mastoid effusion. Inner ear structures grossly normal. Other: None. IMPRESSION: 1. Abnormal signal intensity involving the right lateral mass of C1, concerning for possible osseous metastasis. 2. No other MRI evidence for intracranial metastatic disease within the brain. 3. No other acute intracranial abnormality. 4. Moderately advanced cerebral atrophy with chronic small vessel ischemic disease. Electronically Signed   By: Jeannine Boga M.D.   On: 05/02/2018 21:39   Mr Thoracic Spine Wo Contrast  Addendum Date: 04/28/2018   ADDENDUM REPORT: 04/28/2018 16:32 ADDENDUM: These results were called by telephone at the time of interpretation on 04/28/2018 at 15:42 to Dr. Myrene Buddy , who verbally acknowledged these results. Electronically Signed   By: Staci Righter M.D.   On: 04/28/2018 16:32   Result Date: 04/28/2018 CLINICAL DATA:  Back pain. EXAM: MRI THORACIC SPINE WITHOUT CONTRAST TECHNIQUE: Multiplanar, multisequence MR imaging of the thoracic spine was performed. No intravenous contrast was administered. COMPARISON:  Plain films 04/27/2018. FINDINGS: Alignment:  Anatomic Vertebrae: Widespread metastatic disease to the thoracic spine, near complete replacement T1, T6, T7, T9 and T12. Pathologic fracture of T9, loss of approximately 25-33% vertebral body height. Involvement of the pedicles and posterior elements at T6, extending to the spinous process. Partial vertebral body involvement with metastatic disease of T11, T2, and T5. Cord: Significant cord compression at T6, due to bulky dorsal epidural tumor, roughly 16 x 12 x 32 mm cross-section. No definite abnormal cord signal, but the patient is at risk for symptomatic cord compression. Slight posterior displacement of the posterior wall of T9 with epidural tumor  ventrally at this level, which does not result in significant stenosis or impingement. Paraspinal and other soft tissues: BILATERAL pleural effusions, greater on the LEFT. Suspected LEFT hilar mass. Disc levels: Minor disc pathology at multiple levels, not contributory. IMPRESSION: Widespread metastatic disease involving numerous thoracic vertebrae including T1, T2, T5, T6, T7, T9, T11, and T12. Pathologic fracture T9. Bulky dorsal epidural tumor at T6 results in significant cord compression. Neurosurgical consultation may be warranted. BILATERAL pleural effusions, greater on the LEFT with suspected LEFT hilar mass. Recommend CT chest with contrast for further evaluation. Furthermore, given the heterogeneous appearance of the liver on noncontrast CT from 04/01/2018, consider CT abdomen and pelvis with contrast for additional staging. A call has been placed to the ordering provider. Electronically Signed: By: Staci Righter M.D. On: 04/28/2018 15:31   Ct Abdomen Pelvis W Contrast  Result Date: 04/29/2018 CLINICAL DATA:  77 year old female with history of metastatic disease to the spine. Unknown primary neoplasm. EXAM: CT CHEST, ABDOMEN, AND PELVIS WITH CONTRAST TECHNIQUE: Multidetector CT imaging of the chest, abdomen and pelvis was performed following the standard protocol during bolus administration of intravenous contrast. CONTRAST:  134mL OMNIPAQUE IOHEXOL 300 MG/ML  SOLN COMPARISON:  CT the abdomen and pelvis 04/01/2018. No prior chest CT. FINDINGS: CT CHEST FINDINGS Cardiovascular: Heart size is borderline enlarged. There is no significant pericardial fluid, thickening or pericardial calcification. There is aortic atherosclerosis, as well as atherosclerosis of the great vessels of the mediastinum and  the coronary arteries, including calcified atherosclerotic plaque in the left main, left anterior descending, left circumflex and right coronary arteries. Mediastinum/Nodes: Multiple prominent borderline  enlarged and enlarged mediastinal and bilateral hilar lymph nodes are noted. The largest of these include 2.3 cm low right paratracheal lymph node, 2.1 cm subcarinal lymph node, 1.9 cm AP window lymph node and a 2.2 cm left hilar lymph node. Esophagus is unremarkable in appearance. No axillary lymphadenopathy. Lungs/Pleura: Small bilateral pleural effusions lying dependently with some associated passive subsegmental atelectasis in the lower lobes of the lungs bilaterally. There is a background of interlobular septal thickening, suggesting underlying pulmonary edema. In the lateral aspect of the left major fissure there is a macrolobulated mass which measures 3.3 x 1.8 x 3.3 cm (axial image 77 of series 6 and sagittal image 136 of series 8) which appears to extend into both the left upper and lower lobes. Musculoskeletal: Innumerable predominantly lytic lesions are noted throughout the visualized axial and appendicular skeleton, compatible with widespread metastatic disease to the bones. There is an associated pathologic compression fracture of superior endplate of T9 with approximately 30% loss of anterior vertebral body height. CT ABDOMEN PELVIS FINDINGS Hepatobiliary: There are several hypovascular hepatic lesions in the liver measuring 3.8 x 2.7 cm in segment 8 (axial image 51 of series 2), 2.8 x 3.4 cm in segment 4B/5 (axial image 71 of series 2) and 1.5 x 1.5 cm in the central aspect of segment 4A (axial image 59 of series 2). No intra or extrahepatic biliary ductal dilatation. Gallbladder is moderately distended, but otherwise unremarkable in appearance. Pancreas: No pancreatic mass. No pancreatic ductal dilatation. No pancreatic or peripancreatic fluid or inflammatory changes. Spleen: Unremarkable. Adrenals/Urinary Tract: Subcentimeter low-attenuation lesions in the lower pole of the right kidney are too small to definitively characterize, but are statistically likely to represent tiny cysts. Left kidney and  bilateral adrenal glands are normal in appearance. No hydroureteronephrosis. Urinary bladder is normal in appearance. Stomach/Bowel: The appearance of the stomach is normal. There is no pathologic dilatation of small bowel or colon. Numerous colonic diverticulae are noted, particularly in the sigmoid colon, without surrounding inflammatory changes to suggest an acute diverticulitis at this time. The appendix is not confidently identified and may be surgically absent. Regardless, there are no inflammatory changes noted adjacent to the cecum to suggest the presence of an acute appendicitis at this time. Vascular/Lymphatic: Aortic atherosclerosis, without evidence of aneurysm or dissection in the abdominal or pelvic vasculature. No lymphadenopathy noted in the abdomen or pelvis. Reproductive: Uterus and ovaries are unremarkable in appearance. Other: No significant volume of ascites. No pneumoperitoneum. Musculoskeletal: Multiple small lytic lesions scattered throughout the visualized axial and appendicular skeleton, compatible with widespread metastatic disease to the bones. Nondisplaced healing fractures of the right superior and inferior pubic rami. Nondisplaced fractures through both sacral ala. IMPRESSION: 1. 3.3 x 1.8 x 3.3 cm mass in the lateral aspect of the left upper and lower lobes which appears to extend into both the left upper and lower lobes, with associated mediastinal and bilateral hilar lymphadenopathy, as detailed above, highly concerning for primary bronchogenic neoplasm. Further evaluation with PET-CT and/or biopsy is recommended in the near future for diagnostic and staging purposes. 2. 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 3. Heart size is borderline enlarged, and there is evidence of mild interstitial pulmonary edema and small bilateral pleural effusions; imaging findings suggestive of congestive heart failure. 4. Aortic atherosclerosis, in addition to left main and 3  vessel coronary artery disease. Assessment for potential risk factor modification, dietary therapy or pharmacologic therapy may be warranted, if clinically indicated. 5. Colonic diverticulosis without evidence of acute diverticulitis at this time. 6. Additional incidental findings, as above. Aortic Atherosclerosis (ICD10-I70.0). Electronically Signed   By: Vinnie Langton M.D.   On: 04/29/2018 07:31   US Biopsy (liver)  Result Date: 04/29/2018 INDICATION: 77 year old with chest lymphadenopathy, bone lesions and liver lesions. Findings are suspicious for metastatic disease and tissue diagnosis is needed. EXAM: ULTRASOUND-GUIDED LIVER LESION BIOPSY MEDICATIONS: None. ANESTHESIA/SEDATION: Moderate (conscious) sedation was employed during this procedure. A total of Versed 1 mg and Fentanyl 50 mcg was administered intravenously. Moderate Sedation Time: 15 minutes. The patient's level of consciousness and vital signs were monitored continuously by radiology nursing throughout the procedure under my direct supervision. FLUOROSCOPY TIME:  None COMPLICATIONS: None immediate. PROCEDURE: Informed written consent was obtained from the patient after a thorough discussion of the procedural risks, benefits and alternatives. All questions were addressed. A timeout was performed prior to the initiation of the procedure. Ultrasound was used to evaluate the liver. A very subtle isoechoic lesion was identified in the segment 4B region adjacent to the gallbladder. This appeared to correspond with the recent CT findings. The right abdomen was prepped and draped in sterile fashion. Maximal barrier sterile technique was utilized including mask, sterile gowns, sterile gloves, sterile drape, hand hygiene and skin antiseptic. Skin was anesthetized with 1% lidocaine. 17 gauge needle directed into the subtle lesion with ultrasound guidance. Four core biopsies obtained with an 18 gauge device. Specimens placed in formalin. Needle was  removed without complication. FINDINGS: Very subtle isoechoic lesion adjacent to the gallbladder. This area did correspond with the recent CT findings. Four small core biopsies were obtained. No significant bleeding or hematoma formation after the core biopsies. IMPRESSION: Ultrasound-guided core biopsies of the subtle hepatic lesion. Visualization of this lesion was very difficult and a false negative biopsy is a possibility. Electronically Signed   By: Markus Daft M.D.   On: 04/29/2018 18:47    ASSESSMENT AND PLAN: This is a very pleasant 77 years old white female recently diagnosed with extensive stage small cell lung cancer in November 2019.  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 The patient is status post 1 cycle of systemic chemotherapy with carboplatin and etoposide.  She was unable to receive Tecentriq during her hospitalization. The patient tolerated the first cycle of her systemic chemotherapy fairly well with no concerning adverse sick effects except for mild nausea. I recommended for the patient to receive Neulasta injection today because of the significant chemotherapy-induced neutropenia. She will continue to have weekly lab for close monitoring of her blood count and adverse effect of the chemotherapy. I will see her back for follow-up visit in 2 weeks for evaluation before starting cycle #2 of her systemic chemotherapy. The patient was advised to call immediately if she has any concerning symptoms in the interval. The patient voices understanding of current disease status and treatment options and is in agreement with the current care plan.  All questions were answered. The patient knows to call the clinic with any problems, questions or concerns. We can certainly see the patient much sooner if necessary.  I spent 15 minutes counseling the patient face to face. The total time spent in the appointment was 25  minutes.  Disclaimer: This note was dictated with voice recognition software. Similar sounding words can  inadvertently be transcribed and may not be corrected upon review.       

## 2018-05-14 NOTE — Addendum Note (Signed)
Addended by: Ardeen Garland on: 05/14/2018 03:17 PM   Modules accepted: Orders

## 2018-05-15 ENCOUNTER — Encounter: Payer: Self-pay | Admitting: Radiation Oncology

## 2018-05-15 ENCOUNTER — Ambulatory Visit
Admission: RE | Admit: 2018-05-15 | Discharge: 2018-05-15 | Disposition: A | Discharge status: Home / Self Care | Payer: Medicare Other | Source: Ambulatory Visit | Attending: Radiation Oncology | Admitting: Radiation Oncology

## 2018-05-15 DIAGNOSIS — C7951 Secondary malignant neoplasm of bone: Secondary | ICD-10-CM | POA: Diagnosis not present

## 2018-05-15 DIAGNOSIS — G9529 Other cord compression: Secondary | ICD-10-CM | POA: Diagnosis not present

## 2018-05-17 DIAGNOSIS — E871 Hypo-osmolality and hyponatremia: Secondary | ICD-10-CM | POA: Diagnosis not present

## 2018-05-17 DIAGNOSIS — C7951 Secondary malignant neoplasm of bone: Secondary | ICD-10-CM | POA: Diagnosis not present

## 2018-05-17 DIAGNOSIS — C3432 Malignant neoplasm of lower lobe, left bronchus or lung: Secondary | ICD-10-CM | POA: Diagnosis not present

## 2018-05-17 DIAGNOSIS — K121 Other forms of stomatitis: Secondary | ICD-10-CM | POA: Diagnosis not present

## 2018-05-20 ENCOUNTER — Telehealth: Payer: Self-pay | Admitting: Internal Medicine

## 2018-05-20 DIAGNOSIS — C7951 Secondary malignant neoplasm of bone: Secondary | ICD-10-CM | POA: Diagnosis not present

## 2018-05-20 DIAGNOSIS — C3432 Malignant neoplasm of lower lobe, left bronchus or lung: Secondary | ICD-10-CM | POA: Diagnosis not present

## 2018-05-20 DIAGNOSIS — E871 Hypo-osmolality and hyponatremia: Secondary | ICD-10-CM | POA: Diagnosis not present

## 2018-05-20 DIAGNOSIS — S22070D Wedge compression fracture of T9-T10 vertebra, subsequent encounter for fracture with routine healing: Secondary | ICD-10-CM | POA: Diagnosis not present

## 2018-05-20 NOTE — Telephone Encounter (Signed)
Scheduled appt per 11/26 los - left message for patient with appt date and time

## 2018-05-24 ENCOUNTER — Telehealth (INDEPENDENT_AMBULATORY_CARE_PROVIDER_SITE_OTHER): Payer: Self-pay

## 2018-05-24 ENCOUNTER — Telehealth: Payer: Self-pay | Admitting: Hematology

## 2018-05-24 NOTE — Telephone Encounter (Signed)
Returned call to son re confirming upcoming appointments. Spoke with son re next appointment for 12/10. Patient will get updated schedule 12/10. Also updated phone number to reflect son's number [phone] being primary. Also spoke with patient and per patient we should be calling her son.

## 2018-05-24 NOTE — Telephone Encounter (Signed)
Error

## 2018-05-24 NOTE — Telephone Encounter (Signed)
Patient's son Abe People called stating that patient has Cancer along her Spine and would like a call back form Dr. Lorin Mercy.  CB# is [phone].  Thank you.

## 2018-05-27 ENCOUNTER — Other Ambulatory Visit: Payer: Self-pay | Admitting: Nurse Practitioner

## 2018-05-27 DIAGNOSIS — C3432 Malignant neoplasm of lower lobe, left bronchus or lung: Secondary | ICD-10-CM

## 2018-05-27 DIAGNOSIS — C7951 Secondary malignant neoplasm of bone: Secondary | ICD-10-CM | POA: Diagnosis not present

## 2018-05-27 DIAGNOSIS — C787 Secondary malignant neoplasm of liver and intrahepatic bile duct: Secondary | ICD-10-CM | POA: Diagnosis not present

## 2018-05-27 DIAGNOSIS — E871 Hypo-osmolality and hyponatremia: Secondary | ICD-10-CM | POA: Diagnosis not present

## 2018-05-27 NOTE — Telephone Encounter (Signed)
noted 

## 2018-05-27 NOTE — Telephone Encounter (Signed)
Please see below and advise.

## 2018-05-27 NOTE — Telephone Encounter (Signed)
I called discussed. In 3 months t-spine xrays went from neg to multiple compression fx with met disease from lung metastatic disease.FYI

## 2018-05-28 ENCOUNTER — Inpatient Hospital Stay: Payer: Medicare Other

## 2018-05-28 ENCOUNTER — Other Ambulatory Visit: Payer: Self-pay | Admitting: Nurse Practitioner

## 2018-05-28 ENCOUNTER — Inpatient Hospital Stay (HOSPITAL_BASED_OUTPATIENT_CLINIC_OR_DEPARTMENT_OTHER): Payer: Medicare Other | Admitting: Nurse Practitioner

## 2018-05-28 ENCOUNTER — Encounter: Payer: Self-pay | Admitting: Nurse Practitioner

## 2018-05-28 VITALS — BP 88/52 | HR 81 | Temp 98.2°F | Resp 19 | Ht 67.0 in | Wt 143.3 lb

## 2018-05-28 DIAGNOSIS — Z743 Need for continuous supervision: Secondary | ICD-10-CM | POA: Diagnosis not present

## 2018-05-28 DIAGNOSIS — I340 Nonrheumatic mitral (valve) insufficiency: Secondary | ICD-10-CM | POA: Diagnosis not present

## 2018-05-28 DIAGNOSIS — I959 Hypotension, unspecified: Secondary | ICD-10-CM | POA: Insufficient documentation

## 2018-05-28 DIAGNOSIS — C3432 Malignant neoplasm of lower lobe, left bronchus or lung: Secondary | ICD-10-CM | POA: Diagnosis not present

## 2018-05-28 DIAGNOSIS — G992 Myelopathy in diseases classified elsewhere: Secondary | ICD-10-CM | POA: Diagnosis not present

## 2018-05-28 DIAGNOSIS — C787 Secondary malignant neoplasm of liver and intrahepatic bile duct: Secondary | ICD-10-CM | POA: Diagnosis not present

## 2018-05-28 DIAGNOSIS — Z923 Personal history of irradiation: Secondary | ICD-10-CM

## 2018-05-28 DIAGNOSIS — R1031 Right lower quadrant pain: Secondary | ICD-10-CM | POA: Diagnosis not present

## 2018-05-28 DIAGNOSIS — Z5111 Encounter for antineoplastic chemotherapy: Secondary | ICD-10-CM

## 2018-05-28 DIAGNOSIS — F17210 Nicotine dependence, cigarettes, uncomplicated: Secondary | ICD-10-CM

## 2018-05-28 DIAGNOSIS — R590 Localized enlarged lymph nodes: Secondary | ICD-10-CM | POA: Diagnosis present

## 2018-05-28 DIAGNOSIS — R609 Edema, unspecified: Secondary | ICD-10-CM | POA: Diagnosis not present

## 2018-05-28 DIAGNOSIS — R279 Unspecified lack of coordination: Secondary | ICD-10-CM | POA: Diagnosis not present

## 2018-05-28 DIAGNOSIS — R410 Disorientation, unspecified: Secondary | ICD-10-CM

## 2018-05-28 DIAGNOSIS — R1084 Generalized abdominal pain: Secondary | ICD-10-CM

## 2018-05-28 DIAGNOSIS — Z5112 Encounter for antineoplastic immunotherapy: Secondary | ICD-10-CM | POA: Insufficient documentation

## 2018-05-28 DIAGNOSIS — J449 Chronic obstructive pulmonary disease, unspecified: Secondary | ICD-10-CM | POA: Insufficient documentation

## 2018-05-28 DIAGNOSIS — E039 Hypothyroidism, unspecified: Secondary | ICD-10-CM | POA: Diagnosis present

## 2018-05-28 DIAGNOSIS — R5383 Other fatigue: Secondary | ICD-10-CM

## 2018-05-28 DIAGNOSIS — C7951 Secondary malignant neoplasm of bone: Secondary | ICD-10-CM

## 2018-05-28 DIAGNOSIS — G8220 Paraplegia, unspecified: Secondary | ICD-10-CM | POA: Diagnosis not present

## 2018-05-28 DIAGNOSIS — R142 Eructation: Secondary | ICD-10-CM

## 2018-05-28 DIAGNOSIS — K5909 Other constipation: Secondary | ICD-10-CM | POA: Diagnosis not present

## 2018-05-28 DIAGNOSIS — R159 Full incontinence of feces: Secondary | ICD-10-CM | POA: Diagnosis not present

## 2018-05-28 DIAGNOSIS — Z66 Do not resuscitate: Secondary | ICD-10-CM | POA: Diagnosis not present

## 2018-05-28 DIAGNOSIS — I5032 Chronic diastolic (congestive) heart failure: Secondary | ICD-10-CM | POA: Diagnosis not present

## 2018-05-28 DIAGNOSIS — E7800 Pure hypercholesterolemia, unspecified: Secondary | ICD-10-CM | POA: Diagnosis present

## 2018-05-28 DIAGNOSIS — R1030 Lower abdominal pain, unspecified: Secondary | ICD-10-CM | POA: Diagnosis not present

## 2018-05-28 DIAGNOSIS — R32 Unspecified urinary incontinence: Secondary | ICD-10-CM | POA: Diagnosis not present

## 2018-05-28 DIAGNOSIS — S24112A Complete lesion at T2-T6 level of thoracic spinal cord, initial encounter: Secondary | ICD-10-CM | POA: Diagnosis not present

## 2018-05-28 DIAGNOSIS — K30 Functional dyspepsia: Secondary | ICD-10-CM | POA: Diagnosis present

## 2018-05-28 DIAGNOSIS — N3080 Other cystitis without hematuria: Secondary | ICD-10-CM | POA: Diagnosis not present

## 2018-05-28 DIAGNOSIS — R627 Adult failure to thrive: Secondary | ICD-10-CM | POA: Diagnosis present

## 2018-05-28 DIAGNOSIS — N1330 Unspecified hydronephrosis: Secondary | ICD-10-CM | POA: Diagnosis not present

## 2018-05-28 DIAGNOSIS — M8448XA Pathological fracture, other site, initial encounter for fracture: Secondary | ICD-10-CM | POA: Diagnosis not present

## 2018-05-28 DIAGNOSIS — K625 Hemorrhage of anus and rectum: Secondary | ICD-10-CM | POA: Diagnosis not present

## 2018-05-28 DIAGNOSIS — N132 Hydronephrosis with renal and ureteral calculous obstruction: Secondary | ICD-10-CM | POA: Diagnosis not present

## 2018-05-28 DIAGNOSIS — G893 Neoplasm related pain (acute) (chronic): Secondary | ICD-10-CM | POA: Diagnosis not present

## 2018-05-28 DIAGNOSIS — G9529 Other cord compression: Secondary | ICD-10-CM | POA: Diagnosis not present

## 2018-05-28 DIAGNOSIS — K649 Unspecified hemorrhoids: Secondary | ICD-10-CM | POA: Diagnosis not present

## 2018-05-28 DIAGNOSIS — R05 Cough: Secondary | ICD-10-CM | POA: Diagnosis not present

## 2018-05-28 DIAGNOSIS — C781 Secondary malignant neoplasm of mediastinum: Secondary | ICD-10-CM | POA: Diagnosis not present

## 2018-05-28 DIAGNOSIS — E038 Other specified hypothyroidism: Secondary | ICD-10-CM | POA: Diagnosis not present

## 2018-05-28 DIAGNOSIS — T451X5A Adverse effect of antineoplastic and immunosuppressive drugs, initial encounter: Secondary | ICD-10-CM | POA: Diagnosis present

## 2018-05-28 DIAGNOSIS — A499 Bacterial infection, unspecified: Secondary | ICD-10-CM | POA: Diagnosis not present

## 2018-05-28 DIAGNOSIS — Z1612 Extended spectrum beta lactamase (ESBL) resistance: Secondary | ICD-10-CM | POA: Diagnosis not present

## 2018-05-28 DIAGNOSIS — D61810 Antineoplastic chemotherapy induced pancytopenia: Secondary | ICD-10-CM | POA: Diagnosis not present

## 2018-05-28 DIAGNOSIS — G834 Cauda equina syndrome: Secondary | ICD-10-CM | POA: Diagnosis not present

## 2018-05-28 DIAGNOSIS — K219 Gastro-esophageal reflux disease without esophagitis: Secondary | ICD-10-CM | POA: Diagnosis not present

## 2018-05-28 DIAGNOSIS — I513 Intracardiac thrombosis, not elsewhere classified: Secondary | ICD-10-CM | POA: Diagnosis not present

## 2018-05-28 LAB — CMP (CANCER CENTER ONLY)
ALBUMIN: 2.3 g/dL — AB (ref 3.5–5.0)
ALK PHOS: 181 U/L — AB (ref 38–126)
ALT: 11 U/L (ref 0–44)
ANION GAP: 12 (ref 5–15)
AST: 9 U/L — ABNORMAL LOW (ref 15–41)
BUN: 20 mg/dL (ref 8–23)
CALCIUM: 8.5 mg/dL — AB (ref 8.9–10.3)
CO2: 23 mmol/L (ref 22–32)
Chloride: 101 mmol/L (ref 98–111)
Creatinine: 0.7 mg/dL (ref 0.44–1.00)
GFR, Est AFR Am: >60 mL/min (ref >60)
GFR, Estimated: >60 mL/min (ref >60)
GLUCOSE: 152 mg/dL — AB (ref 70–99)
Potassium: 4.3 mmol/L (ref 3.5–5.1)
SODIUM: 136 mmol/L (ref 135–145)
Total Bilirubin: 0.3 mg/dL (ref 0.3–1.2)
Total Protein: 5.8 g/dL — ABNORMAL LOW (ref 6.5–8.1)

## 2018-05-28 LAB — CBC WITH DIFFERENTIAL (CANCER CENTER ONLY)
Abs Immature Granulocytes: 0.87 10*3/uL — ABNORMAL HIGH (ref 0.00–0.07)
BASOS ABS: 0 10*3/uL (ref 0.0–0.1)
Basophils Relative: 0 %
EOS ABS: 0 10*3/uL (ref 0.0–0.5)
Eosinophils Relative: 0 %
HEMATOCRIT: 31.3 % — AB (ref 36.0–46.0)
Hemoglobin: 9.9 g/dL — ABNORMAL LOW (ref 12.0–15.0)
IMMATURE GRANULOCYTES: 4 %
Lymphocytes Relative: 4 %
Lymphs Abs: 1 10*3/uL (ref 0.7–4.0)
MCH: 27.3 pg (ref 26.0–34.0)
MCHC: 31.6 g/dL (ref 30.0–36.0)
MCV: 86.5 fL (ref 80.0–100.0)
MONOS PCT: 4 %
Monocytes Absolute: 1 10*3/uL (ref 0.1–1.0)
NEUTROS PCT: 88 %
NRBC: 0.1 % (ref 0.0–0.2)
Neutro Abs: 19.3 10*3/uL — ABNORMAL HIGH (ref 1.7–7.7)
Platelet Count: 479 10*3/uL — ABNORMAL HIGH (ref 150–400)
RBC: 3.62 MIL/uL — ABNORMAL LOW (ref 3.87–5.11)
RDW: 17.3 % — ABNORMAL HIGH (ref 11.5–15.5)
WBC Count: 22.2 10*3/uL — ABNORMAL HIGH (ref 4.0–10.5)

## 2018-05-28 LAB — TSH: TSH: 0.808 u[IU]/mL (ref 0.308–3.960)

## 2018-05-28 MED ORDER — SODIUM CHLORIDE 0.9% FLUSH
10.0000 mL | INTRAVENOUS | Status: DC | PRN
  Administered 2018-05-28: 10 mL
  Filled 2018-05-28: qty 10

## 2018-05-28 MED ORDER — PALONOSETRON HCL INJECTION 0.25 MG/5ML
0.2500 mg | Freq: Once | INTRAVENOUS | Status: AC
  Administered 2018-05-28: 0.25 mg via INTRAVENOUS

## 2018-05-28 MED ORDER — OMEPRAZOLE 20 MG PO CPDR: 20.0000 mg | DELAYED_RELEASE_CAPSULE | Freq: Every day | ORAL | 3 refills | Status: DC

## 2018-05-28 MED ORDER — SODIUM CHLORIDE 0.9 % IV SOLN
Freq: Once | INTRAVENOUS | Status: AC
  Administered 2018-05-28: 14:00:00 via INTRAVENOUS
  Filled 2018-05-28: qty 5

## 2018-05-28 MED ORDER — SODIUM CHLORIDE 0.9 % IV SOLN
Freq: Once | INTRAVENOUS | Status: AC
  Administered 2018-05-28: 12:00:00 via INTRAVENOUS
  Filled 2018-05-28: qty 250

## 2018-05-28 MED ORDER — SODIUM CHLORIDE 0.9 % IV SOLN
Freq: Once | INTRAVENOUS | Status: AC
  Administered 2018-05-28: 13:00:00 via INTRAVENOUS
  Filled 2018-05-28: qty 250

## 2018-05-28 MED ORDER — ALUM & MAG HYDROXIDE-SIMETH 200-200-20 MG/5ML PO SUSP
ORAL | Status: AC
  Filled 2018-05-28: qty 30

## 2018-05-28 MED ORDER — SODIUM CHLORIDE 0.9 % IV SOLN
370.0000 mg | Freq: Once | INTRAVENOUS | Status: AC
  Administered 2018-05-28: 370 mg via INTRAVENOUS
  Filled 2018-05-28: qty 37

## 2018-05-28 MED ORDER — PROCHLORPERAZINE MALEATE 10 MG PO TABS: 10.0000 mg | ORAL_TABLET | Freq: Four times a day (QID) | ORAL | 1 refills | Status: AC | PRN

## 2018-05-28 MED ORDER — DEXAMETHASONE 4 MG PO TABS: 4.0000 mg | ORAL_TABLET | Freq: Every day | ORAL | 0 refills | Status: AC

## 2018-05-28 MED ORDER — SODIUM CHLORIDE 0.9 % IV SOLN
100.0000 mg/m2 | Freq: Once | INTRAVENOUS | Status: AC
  Administered 2018-05-28: 180 mg via INTRAVENOUS
  Filled 2018-05-28: qty 9

## 2018-05-28 MED ORDER — HEPARIN SOD (PORK) LOCK FLUSH 100 UNIT/ML IV SOLN
500.0000 [IU] | Freq: Once | INTRAVENOUS | Status: AC | PRN
  Administered 2018-05-28: 250 [IU]
  Filled 2018-05-28: qty 5

## 2018-05-28 MED ORDER — SODIUM CHLORIDE 0.9 % IV SOLN: 1200.0000 mg | Freq: Once | INTRAVENOUS | Status: DC

## 2018-05-28 MED ORDER — ALUM & MAG HYDROXIDE-SIMETH 200-200-20 MG/5ML PO SUSP
15.0000 mL | Freq: Once | ORAL | Status: AC
  Administered 2018-05-28: 15 mL via ORAL

## 2018-05-28 MED ORDER — PALONOSETRON HCL INJECTION 0.25 MG/5ML
INTRAVENOUS | Status: AC
  Filled 2018-05-28: qty 5

## 2018-05-28 MED ORDER — OMEPRAZOLE 40 MG PO CPDR: 40.0000 mg | DELAYED_RELEASE_CAPSULE | Freq: Every day | ORAL | 1 refills | Status: AC

## 2018-05-28 NOTE — Patient Instructions (Signed)
[facility] Discharge Instructions for Patients Receiving Chemotherapy  Today you received the following chemotherapy agents Tecentriq, Carboplatin, Etoposide  To help prevent nausea and vomiting after your treatment, we encourage you to take your nausea medication as prescribed by MD. **DO NOT TAKE ZOFRAN FOR 3 DAYS AFTER TREATMENT**   If you develop nausea and vomiting that is not controlled by your nausea medication, call the clinic.   BELOW ARE SYMPTOMS THAT SHOULD BE REPORTED IMMEDIATELY:  *FEVER GREATER THAN 100.5 F  *CHILLS WITH OR WITHOUT FEVER  NAUSEA AND VOMITING THAT IS NOT CONTROLLED WITH YOUR NAUSEA MEDICATION  *UNUSUAL SHORTNESS OF BREATH  *UNUSUAL BRUISING OR BLEEDING  TENDERNESS IN MOUTH AND THROAT WITH OR WITHOUT PRESENCE OF ULCERS  *URINARY PROBLEMS  *BOWEL PROBLEMS  UNUSUAL RASH Items with * indicate a potential emergency and should be followed up as soon as possible.  Feel free to call the clinic should you have any questions or concerns. The clinic phone number is [phone].  Please show the [facility] at check-in to the Emergency Department and triage nurse.  Etoposide, VP-16 injection What is this medicine? ETOPOSIDE, VP-16 (e toe POE side) is a chemotherapy drug. It is used to treat testicular cancer, lung cancer, and other cancers. This medicine may be used for other purposes; ask your health care provider or pharmacist if you have questions. COMMON BRAND NAME(S): Etopophos, Toposar, VePesid What should I tell my health care provider before I take this medicine? They need to know if you have any of these conditions: -infection -kidney disease -liver disease -low blood counts, like low white cell, platelet, or red cell counts -an unusual or allergic reaction to etoposide, other medicines, foods, dyes, or preservatives -pregnant or trying to get pregnant -breast-feeding How should I use this medicine? This medicine is  for infusion into a vein. It is administered in a hospital or clinic by a specially trained health care professional. Talk to your pediatrician regarding the use of this medicine in children. Special care may be needed. Overdosage: If you think you have taken too much of this medicine contact a poison control center or emergency room at once. NOTE: This medicine is only for you. Do not share this medicine with others. What if I miss a dose? It is important not to miss your dose. Call your doctor or health care professional if you are unable to keep an appointment. What may interact with this medicine? -aspirin -certain medications for seizures like carbamazepine, phenobarbital, phenytoin, valproic acid -cyclosporine -levamisole -warfarin This list may not describe all possible interactions. Give your health care provider a list of all the medicines, herbs, non-prescription drugs, or dietary supplements you use. Also tell them if you smoke, drink alcohol, or use illegal drugs. Some items may interact with your medicine. What should I watch for while using this medicine? Visit your doctor for checks on your progress. This drug may make you feel generally unwell. This is not uncommon, as chemotherapy can affect healthy cells as well as cancer cells. Report any side effects. Continue your course of treatment even though you feel ill unless your doctor tells you to stop. In some cases, you may be given additional medicines to help with side effects. Follow all directions for their use. Call your doctor or health care professional for advice if you get a fever, chills or sore throat, or other symptoms of a cold or flu. Do not treat yourself. This drug decreases your body's ability  to fight infections. Try to avoid being around people who are sick. This medicine may increase your risk to bruise or bleed. Call your doctor or health care professional if you notice any unusual bleeding. Talk to your doctor  about your risk of cancer. You may be more at risk for certain types of cancers if you take this medicine. Do not become pregnant while taking this medicine or for at least 6 months after stopping it. Women should inform their doctor if they wish to become pregnant or think they might be pregnant. Women of child-bearing potential will need to have a negative pregnancy test before starting this medicine. There is a potential for serious side effects to an unborn child. Talk to your health care professional or pharmacist for more information. Do not breast-feed an infant while taking this medicine. Men must use a latex condom during sexual contact with a woman while taking this medicine and for at least 4 months after stopping it. A latex condom is needed even if you have had a vasectomy. Contact your doctor right away if your partner becomes pregnant. Do not donate sperm while taking this medicine and for at least 4 months after you stop taking this medicine. Men should inform their doctors if they wish to father a child. This medicine may lower sperm counts. What side effects may I notice from receiving this medicine? Side effects that you should report to your doctor or health care professional as soon as possible: -allergic reactions like skin rash, itching or hives, swelling of the face, lips, or tongue -low blood counts - this medicine may decrease the number of white blood cells, red blood cells and platelets. You may be at increased risk for infections and bleeding. -signs of infection - fever or chills, cough, sore throat, pain or difficulty passing urine -signs of decreased platelets or bleeding - bruising, pinpoint red spots on the skin, black, tarry stools, blood in the urine -signs of decreased red blood cells - unusually weak or tired, fainting spells, lightheadedness -breathing problems -changes in vision -mouth or throat sores or ulcers -pain, redness, swelling or irritation at the  injection site -pain, tingling, numbness in the hands or feet -redness, blistering, peeling or loosening of the skin, including inside the mouth -seizures -vomiting Side effects that usually do not require medical attention (report to your doctor or health care professional if they continue or are bothersome): -diarrhea -hair loss -loss of appetite -nausea -stomach pain This list may not describe all possible side effects. Call your doctor for medical advice about side effects. You may report side effects to FDA at 1-800-FDA-1088. Where should I keep my medicine? This drug is given in a hospital or clinic and will not be stored at home. NOTE: This sheet is a summary. It may not cover all possible information. If you have questions about this medicine, talk to your doctor, pharmacist, or health care provider.  2018 Elsevier/Gold Standard (2015-05-28 11:53:23)   Carboplatin injection What is this medicine? CARBOPLATIN (KAR boe pla tin) is a chemotherapy drug. It targets fast dividing cells, like cancer cells, and causes these cells to die. This medicine is used to treat ovarian cancer and many other cancers. This medicine may be used for other purposes; ask your health care provider or pharmacist if you have questions. COMMON BRAND NAME(S): Paraplatin What should I tell my health care provider before I take this medicine? They need to know if you have any of these conditions: -blood disorders -  hearing problems -kidney disease -recent or ongoing radiation therapy -an unusual or allergic reaction to carboplatin, cisplatin, other chemotherapy, other medicines, foods, dyes, or preservatives -pregnant or trying to get pregnant -breast-feeding How should I use this medicine? This drug is usually given as an infusion into a vein. It is administered in a hospital or clinic by a specially trained health care professional. Talk to your pediatrician regarding the use of this medicine in  children. Special care may be needed. Overdosage: If you think you have taken too much of this medicine contact a poison control center or emergency room at once. NOTE: This medicine is only for you. Do not share this medicine with others. What if I miss a dose? It is important not to miss a dose. Call your doctor or health care professional if you are unable to keep an appointment. What may interact with this medicine? -medicines for seizures -medicines to increase blood counts like filgrastim, pegfilgrastim, sargramostim -some antibiotics like amikacin, gentamicin, neomycin, streptomycin, tobramycin -vaccines Talk to your doctor or health care professional before taking any of these medicines: -acetaminophen -aspirin -ibuprofen -ketoprofen -naproxen This list may not describe all possible interactions. Give your health care provider a list of all the medicines, herbs, non-prescription drugs, or dietary supplements you use. Also tell them if you smoke, drink alcohol, or use illegal drugs. Some items may interact with your medicine. What should I watch for while using this medicine? Your condition will be monitored carefully while you are receiving this medicine. You will need important blood work done while you are taking this medicine. This drug may make you feel generally unwell. This is not uncommon, as chemotherapy can affect healthy cells as well as cancer cells. Report any side effects. Continue your course of treatment even though you feel ill unless your doctor tells you to stop. In some cases, you may be given additional medicines to help with side effects. Follow all directions for their use. Call your doctor or health care professional for advice if you get a fever, chills or sore throat, or other symptoms of a cold or flu. Do not treat yourself. This drug decreases your body's ability to fight infections. Try to avoid being around people who are sick. This medicine may increase  your risk to bruise or bleed. Call your doctor or health care professional if you notice any unusual bleeding. Be careful brushing and flossing your teeth or using a toothpick because you may get an infection or bleed more easily. If you have any dental work done, tell your dentist you are receiving this medicine. Avoid taking products that contain aspirin, acetaminophen, ibuprofen, naproxen, or ketoprofen unless instructed by your doctor. These medicines may hide a fever. Do not become pregnant while taking this medicine. Women should inform their doctor if they wish to become pregnant or think they might be pregnant. There is a potential for serious side effects to an unborn child. Talk to your health care professional or pharmacist for more information. Do not breast-feed an infant while taking this medicine. What side effects may I notice from receiving this medicine? Side effects that you should report to your doctor or health care professional as soon as possible: -allergic reactions like skin rash, itching or hives, swelling of the face, lips, or tongue -signs of infection - fever or chills, cough, sore throat, pain or difficulty passing urine -signs of decreased platelets or bleeding - bruising, pinpoint red spots on the skin, black, tarry stools, nosebleeds -signs  of decreased red blood cells - unusually weak or tired, fainting spells, lightheadedness -breathing problems -changes in hearing -changes in vision -chest pain -high blood pressure -low blood counts - This drug may decrease the number of white blood cells, red blood cells and platelets. You may be at increased risk for infections and bleeding. -nausea and vomiting -pain, swelling, redness or irritation at the injection site -pain, tingling, numbness in the hands or feet -problems with balance, talking, walking -trouble passing urine or change in the amount of urine Side effects that usually do not require medical attention  (report to your doctor or health care professional if they continue or are bothersome): -hair loss -loss of appetite -metallic taste in the mouth or changes in taste This list may not describe all possible side effects. Call your doctor for medical advice about side effects. You may report side effects to FDA at 1-800-FDA-1088. Where should I keep my medicine? This drug is given in a hospital or clinic and will not be stored at home. NOTE: This sheet is a summary. It may not cover all possible information. If you have questions about this medicine, talk to your doctor, pharmacist, or health care provider.  2018 Elsevier/Gold Standard (2007-09-10 14:38:05)   Atezolizumab injection What is this medicine? ATEZOLIZUMAB (a te zoe LIZ ue mab) is a monoclonal antibody. It is used to treat bladder cancer (urothelial cancer) and non-small cell lung cancer. This medicine may be used for other purposes; ask your health care provider or pharmacist if you have questions. COMMON BRAND NAME(S): Tecentriq What should I tell my health care provider before I take this medicine? They need to know if you have any of these conditions: -diabetes -immune system problems -infection -inflammatory bowel disease -liver disease -lung or breathing disease -lupus -nervous system problems like myasthenia gravis or Guillain-Barre syndrome -organ transplant -an unusual or allergic reaction to atezolizumab, other medicines, foods, dyes, or preservatives -pregnant or trying to get pregnant -breast-feeding How should I use this medicine? This medicine is for infusion into a vein. It is given by a health care professional in a hospital or clinic setting. A special MedGuide will be given to you before each treatment. Be sure to read this information carefully each time. Talk to your pediatrician regarding the use of this medicine in children. Special care may be needed. Overdosage: If you think you have taken too much  of this medicine contact a poison control center or emergency room at once. NOTE: This medicine is only for you. Do not share this medicine with others. What if I miss a dose? It is important not to miss your dose. Call your doctor or health care professional if you are unable to keep an appointment. What may interact with this medicine? Interactions have not been studied. This list may not describe all possible interactions. Give your health care provider a list of all the medicines, herbs, non-prescription drugs, or dietary supplements you use. Also tell them if you smoke, drink alcohol, or use illegal drugs. Some items may interact with your medicine. What should I watch for while using this medicine? Your condition will be monitored carefully while you are receiving this medicine. You may need blood work done while you are taking this medicine. Do not become pregnant while taking this medicine or for at least 5 months after stopping it. Women should inform their doctor if they wish to become pregnant or think they might be pregnant. There is a potential for serious side  effects to an unborn child. Talk to your health care professional or pharmacist for more information. Do not breast-feed an infant while taking this medicine or for at least 5 months after the last dose. What side effects may I notice from receiving this medicine? Side effects that you should report to your doctor or health care professional as soon as possible: -allergic reactions like skin rash, itching or hives, swelling of the face, lips, or tongue -black, tarry stools -bloody or watery diarrhea -breathing problems -changes in vision -chest pain or chest tightness -chills -facial flushing -fever -headache -signs and symptoms of high blood sugar such as dizziness; dry mouth; dry skin; fruity breath; nausea; stomach pain; increased hunger or thirst; increased urination -signs and symptoms of liver injury like dark yellow  or brown urine; general ill feeling or flu-like symptoms; light-colored stools; loss of appetite; nausea; right upper belly pain; unusually weak or tired; yellowing of the eyes or skin -stomach pain -trouble passing urine or change in the amount of urine Side effects that usually do not require medical attention (report to your doctor or health care professional if they continue or are bothersome): -cough -diarrhea -joint pain -muscle pain -muscle weakness -tiredness -weight loss This list may not describe all possible side effects. Call your doctor for medical advice about side effects. You may report side effects to FDA at 1-800-FDA-1088. Where should I keep my medicine? This drug is given in a hospital or clinic and will not be stored at home. NOTE: This sheet is a summary. It may not cover all possible information. If you have questions about this medicine, talk to your doctor, pharmacist, or health care provider.  2018 Elsevier/Gold Standard (2015-07-07 17:54:14)

## 2018-05-28 NOTE — Progress Notes (Signed)
Pt has been tapered down to Dexamethasone 4 mg daily as of today.  This is an equivalent dose of 26 mg Prednisone.  Due to the Pred equivalency being > 10 mg daily, we will hold Tecentriq today.  Ok'd per Cira Rue, NP.  Pt has steroid psychosis. Note: Dr. Julien Nordmann is on PAL today. Kennith Center, Pharm.D., CPP 05/28/2018@1 :32 PM

## 2018-05-28 NOTE — Progress Notes (Addendum)
[place]  [phone]) [phone] [phone]  Clinic Follow up Note   Patient Care Team: Marin Olp, MD as PCP - General (Family Medicine) Pieter Partridge, DO as Consulting Physician (Neurology) Dr. Nori Riis as Consulting Physician (Gynecology) Marybelle Killings, MD as Consulting Physician (Orthopedic Surgery) Massie Maroon, DMD (Dentistry) [place] as Consulting Physician (Physical Therapy) 05/28/2018  DIAGNOSIS: Extensive stage (T2 a, N3, M1c) small cell lung cancer diagnosed in November 2019 and presented with left lower lobe lung mass in addition to bilateral hilar and mediastinal lymphadenopathy as well as metastatic disease to bones and liver.  PRIOR THERAPY: Palliative radiotherapy to the metastatic bone disease at T6 with cord compression under the care of Dr. Tammi Klippel. Completed 11/27   CURRENT THERAPY: Systemic chemotherapy with carboplatin for AUC of 5 on day 1, etoposide 100 mg/M2 on days 1, 2 and 3 with Neulasta support as well as Tecentriq (Atezolizumab) 1200 mg IV every 3 weeks.  Status post 1 cycle, Tecentriq not given with cycle 1.  First dose was given on May 07, 2018 at [facility]: [name] [name] returns for follow up and cycle 2 chemo as scheduled. She presents from SNF with her son. She is in a wheelchair. She completed cycle 1 carboplatin and etoposide on 11/19 to 11/21 in the hospital. She was seen by Dr. Julien Nordmann on 11/26 and received Udenyca. Her son reports for 2 days after injection she was "out of it" and difficult to arouse. The patient thought she had died. She has no memory of those 2 days. Nursing staff performed care in the bed. She recovered mentally and physically per her son. Still is confused. She continues PT BID at [facility] to require assistance with most ADLs, can eat independently. She is not eating much, has nausea with position changes and belching. She takes zofran 2-3 times  per day and prilosec daily. Drinks ensure BID. Has daily BM. She is using nystatin and lidocain rinse for mouth sores. She has occasional feeling of not being able to get a deep breath, her son thinks is anxiety-related. She does not sleep well at night. Her back pain is much better, has occasional "sore head." On fentanyl patch and MS contin BID with norco for breakthrough. Denies fever, chills, cough, chest pain, dyspnea, leg edema, or neuropathy.   MEDICAL HISTORY:  Past Medical History:  Diagnosis Date  . Asymptomatic varicose veins   . Cataract    beginnings  . Cigarette smoker   . COPD (chronic obstructive pulmonary disease) ([place])   . Diverticulosis of colon (without mention of hemorrhage)   . Headache(784.0)   . Hearing loss    wears hearing aides  . Lumbar back pain   . Pure hypercholesterolemia   . Stress disorder, acute   . Varicose veins     SURGICAL HISTORY: Past Surgical History:  Procedure Laterality Date  . COLONOSCOPY  04-22-2004  . LUMBAR SPINE SURGERY     [phone]  . varicose vein treatment     at [facility]    I have reviewed the social history and family history with the patient and they are unchanged from previous note.  ALLERGIES:  is allergic to atorvastatin; cyclobenzaprine; ezetimibe; rosuvastatin; gadolinium derivatives; and nicoderm [nicotine].  MEDICATIONS:  Current Outpatient Medications  Medication Sig Dispense Refill  . ALPRAZolam (XANAX) 0.5 MG tablet Take 1 tablet (0.5 mg total) by mouth 2 (two) times daily as needed for anxiety or  sleep. 15 tablet 0  . dexamethasone (DECADRON) 4 MG tablet Take 1 tablet (4 mg total) by mouth daily. 30 tablet 0  . feeding supplement, ENSURE ENLIVE, (ENSURE ENLIVE) LIQD Take 237 mLs by mouth 2 (two) times daily between meals. 237 mL 12  . fentaNYL (DURAGESIC - DOSED MCG/HR) 25 MCG/HR patch Place 1 patch (25 mcg total) onto the skin every 3 (three) days. 5 patch 0  . levothyroxine (SYNTHROID,  LEVOTHROID) 75 MCG tablet TAKE 1 TABLET(75 MCG) BY MOUTH DAILY (Patient taking differently: Take 75 mcg by mouth daily before breakfast. ) 90 tablet 1  . lidocaine (XYLOCAINE) 2 % solution Use as directed 15 mLs in the mouth or throat every 3 (three) hours as needed for mouth pain (mouth ulcers). 100 mL 0  . nicotine polacrilex (NICORETTE) 2 MG gum Take 1 each (2 mg total) by mouth as needed for smoking cessation. 100 tablet 0  . nystatin (MYCOSTATIN) 100000 UNIT/ML suspension Take 5 mLs (500,000 Units total) by mouth 4 (four) times daily. 60 mL 0  . ondansetron (ZOFRAN-ODT) 4 MG disintegrating tablet Take 1 tablet (4 mg total) by mouth every 8 (eight) hours as needed for nausea or vomiting. 20 tablet 0  . polyethylene glycol (MIRALAX / GLYCOLAX) packet Take 17 g by mouth daily. 14 each 0  . senna (SENOKOT) 8.6 MG TABS tablet Take 1 tablet (8.6 mg total) by mouth 2 (two) times daily. 120 each 0  . omeprazole (PRILOSEC) 40 MG capsule Take 1 capsule (40 mg total) by mouth daily. 30 capsule 1  . prochlorperazine (COMPAZINE) 10 MG tablet Take 1 tablet (10 mg total) by mouth every 6 (six) hours as needed for nausea or vomiting. 30 tablet 1   No current facility-administered medications for this visit.    Facility-Administered Medications Ordered in Other Visits  Medication Dose Route Frequency Provider Last Rate Last Dose  . etoposide (VEPESID) 180 mg in sodium chloride 0.9 % 500 mL chemo infusion  100 mg/m2 (Treatment Plan Recorded) Intravenous Once Ladell Pier, MD 509 mL/hr at 05/28/18 1540 180 mg at 05/28/18 1540  . heparin lock flush 100 unit/mL  500 Units Intracatheter Once PRN Betsy Coder B, MD      . sodium chloride flush (NS) 0.9 % injection 10 mL  10 mL Intracatheter PRN Ladell Pier, MD        PHYSICAL EXAMINATION: ECOG PERFORMANCE STATUS: 1-2   Vitals:   05/28/18 1048 05/28/18 1100  BP: (!) 87/68 (!) 88/52  Pulse: 81   Resp: 19   Temp: 98.2 F (36.8 C)   SpO2: 99%     Filed Weights   05/28/18 1100  Weight: 143 lb 5 oz (65 kg)    GENERAL:alert, no distress and comfortable. Presents in wheelchair  SKIN:  Erythema and dryness to thoracic area secondary to radiation  EYES:  sclera clear OROPHARYNX: no thrush or ulcers LUNGS: clear to auscultation with normal breathing effort HEART: regular rate & rhythm, no lower extremity edema ABDOMEN:abdomen soft, non-tender and normal bowel sounds. No hepatomegaly  NEURO: alert & oriented to place and situation; not oriented to time. Fluent speech. Lower extremity weakness.  R arm PICC without erythema or extremity swelling   LABORATORY DATA:  I have reviewed the data as listed CBC Latest Ref Rng & Units 05/28/2018 05/14/2018 05/10/2018  WBC 4.0 - 10.5 K/uL 22.2(H) 0.5(LL) 16.1(H)  Hemoglobin 12.0 - 15.0 g/dL 9.9(L) 9.8(L) 10.2(L)  Hematocrit 36.0 - 46.0 % 31.3(L) 29.6(L) 31.5(L)  Platelets 150 - 400 K/uL 479(H) 256 395     CMP Latest Ref Rng & Units 05/28/2018 05/14/2018 05/11/2018  Glucose 70 - 99 mg/dL 152(H) 99 134(H)  BUN 8 - 23 mg/dL 20 22 41(H)  Creatinine 0.44 - 1.00 mg/dL 0.70 0.57 0.69  Sodium 135 - 145 mmol/L 136 128(L) 133(L)  Potassium 3.5 - 5.1 mmol/L 4.3 4.4 4.9  Chloride 98 - 111 mmol/L 101 96(L) 100  CO2 22 - 32 mmol/L 23 24 26   Calcium 8.9 - 10.3 mg/dL 8.5(L) 7.8(L) 8.1(L)  Total Protein 6.5 - 8.1 g/dL 5.8(L) 5.2(L) -  Total Bilirubin 0.3 - 1.2 mg/dL 0.3 0.5 -  Alkaline Phos 38 - 126 U/L 181(H) 103 -  AST 15 - 41 U/L 9(L) 32 -  ALT 0 - 44 U/L 11 68(H) -      RADIOGRAPHIC STUDIES: I have personally reviewed the radiological images as listed and agreed with the findings in the report. No results found.   ASSESSMENT & PLAN: 1 yowf with extensive stage small cell lung cancer, s/p cycle 1 systemic carboplatin AUC 5 on day 1 and etoposide 100 mg/m2 on days 1-3 with Udenyca. She received first cycle while hospitalized, and has been recovering and rehabilitating at [facility]. She  remains weak with low activity level, but continues PT BID. She tolerated first cycle with nausea, which persists today. I prescribed compazine for additional anti-emetic relief; she can take 1 tab q6h PRN and alternate with zofran as needed. For esophagitis and reflux, likely related to radiation, I increased omeprazole to 40 mg daily and prescribed maalox and sent new prescription. She had what her son describes as somnolence after Udenyca, unclear if directly related to the injection. Will discuss further doses with Dr. Julien Nordmann when he returns to the office. I reviewed the cytotoxic nature of her chemo regimen and rationale for continuing injection, she understands. Nadir ANC 0.5 recovered well today. Labs are adequate to proceed with cycle 2.    For confusion, likely steroid-induced and contributed by multiple long acting narcotics, will taper decadron to 4 mg daily starting today. Will discontinue MS contin, she continues fentanyl patch and norco PRN for breakthrough per her facility. Given her steroid use, will continue to hold atezolizumab. I discussed with pharmacy. Will defer to Dr. Julien Nordmann regarding start date for this.   For hypotension with BP 87/68, she is asymptomatic. I urged her to increase po intake and hydration. She is not on anti-hypertensive. Will support with 1 liter IVF today during infusion.    The patient was seen with Dr. Benay Spice; will review changes with Dr. Julien Nordmann when he returns to the office.   PLAN: -Labs, VS reviewed  -Proceed with cycle 2 carboplatin/etoposide today, etoposide on days 2 and 3; at current doses -Due to steroid use, hold atezolizumab with cycle 2 -Udenyca on day 5 -1 liter NS IVF today with chemo infusion and on day 5 with Udenyca  -Taper decadron, begin 4 mg daily today  -Increase omeprazole to 40 mg daily -New Rx: Compazine 10 mg, take 1 tab q6h PRN for nausea, can start 12/10  -Discontinue MS contin  -Return for weekly lab x2 -F/u with Dr.  Julien Nordmann in 3 weeks with cycle 3  All questions were answered. The patient knows to call the clinic with any problems, questions or concerns. No barriers to learning was detected.     Alla Feeling, NP 05/28/18  This was a shared visit with Cira Rue.  Ms.  [name] was interviewed and examined.  She has extensive stage mall cell lung cancer.  She appeared mildly confused and agitated today.  I think this is most likely related to steroids with a possible component from narcotics.  We taper the Decadron to 4 mg daily and discontinued MS Contin.  She will complete cycle 2 etoposide/carboplatin beginning today.Atezolizumab will be held secondary to the recent high-dose steroid use.  Julieanne Manson, MD  Addendum: Reviewed the above with Dr. Julien Nordmann upon his return to the office on 05/29/18. He recommends restaging after cycle 2. I ordered CT CAP w contrast to be done before next f/u.  Cira Rue, NP 05/29/18

## 2018-05-29 ENCOUNTER — Telehealth: Payer: Self-pay | Admitting: Hematology

## 2018-05-29 ENCOUNTER — Other Ambulatory Visit: Payer: Self-pay | Admitting: Internal Medicine

## 2018-05-29 ENCOUNTER — Inpatient Hospital Stay: Payer: Medicare Other

## 2018-05-29 ENCOUNTER — Other Ambulatory Visit: Payer: Self-pay | Admitting: Nurse Practitioner

## 2018-05-29 VITALS — BP 95/71 | HR 74 | Temp 97.8°F | Resp 19

## 2018-05-29 DIAGNOSIS — C3490 Malignant neoplasm of unspecified part of unspecified bronchus or lung: Secondary | ICD-10-CM

## 2018-05-29 DIAGNOSIS — C3432 Malignant neoplasm of lower lobe, left bronchus or lung: Secondary | ICD-10-CM

## 2018-05-29 MED ORDER — SODIUM CHLORIDE 0.9% FLUSH
10.0000 mL | INTRAVENOUS | Status: DC | PRN
  Administered 2018-05-29: 10 mL
  Filled 2018-05-29: qty 10

## 2018-05-29 MED ORDER — SODIUM CHLORIDE 0.9 % IV SOLN: 10.0000 mg | Freq: Once | INTRAVENOUS | Status: DC

## 2018-05-29 MED ORDER — DEXAMETHASONE SODIUM PHOSPHATE 10 MG/ML IJ SOLN
INTRAMUSCULAR | Status: AC
  Filled 2018-05-29: qty 1

## 2018-05-29 MED ORDER — SODIUM CHLORIDE 0.9 % IV SOLN
Freq: Once | INTRAVENOUS | Status: AC
  Administered 2018-05-29: 10:00:00 via INTRAVENOUS
  Filled 2018-05-29: qty 250

## 2018-05-29 MED ORDER — SODIUM CHLORIDE 0.9 % IV SOLN
100.0000 mg/m2 | Freq: Once | INTRAVENOUS | Status: AC
  Administered 2018-05-29: 180 mg via INTRAVENOUS
  Filled 2018-05-29: qty 9

## 2018-05-29 MED ORDER — DEXAMETHASONE SODIUM PHOSPHATE 10 MG/ML IJ SOLN
10.0000 mg | Freq: Once | INTRAMUSCULAR | Status: AC
  Administered 2018-05-29: 10 mg via INTRAVENOUS

## 2018-05-29 MED ORDER — HEPARIN SOD (PORK) LOCK FLUSH 100 UNIT/ML IV SOLN
250.0000 [IU] | Freq: Once | INTRAVENOUS | Status: AC | PRN
  Administered 2018-05-29: 250 [IU]
  Filled 2018-05-29: qty 5

## 2018-05-29 NOTE — Telephone Encounter (Signed)
Spoke with patient son about the rescheduling of her appointment from 12/13 to 12/14 and the two lab appointments that were added.

## 2018-05-29 NOTE — Patient Instructions (Signed)
[facility] Discharge Instructions for Patients Receiving Chemotherapy  Today you received the following chemotherapy agents Etoposide  To help prevent nausea and vomiting after your treatment, we encourage you to take your nausea medication as directed   If you develop nausea and vomiting that is not controlled by your nausea medication, call the clinic.   BELOW ARE SYMPTOMS THAT SHOULD BE REPORTED IMMEDIATELY:  *FEVER GREATER THAN 100.5 F  *CHILLS WITH OR WITHOUT FEVER  NAUSEA AND VOMITING THAT IS NOT CONTROLLED WITH YOUR NAUSEA MEDICATION  *UNUSUAL SHORTNESS OF BREATH  *UNUSUAL BRUISING OR BLEEDING  TENDERNESS IN MOUTH AND THROAT WITH OR WITHOUT PRESENCE OF ULCERS  *URINARY PROBLEMS  *BOWEL PROBLEMS  UNUSUAL RASH Items with * indicate a potential emergency and should be followed up as soon as possible.  Feel free to call the clinic should you have any questions or concerns. The clinic phone number is [phone].  Please show the [place] at check-in to the Emergency Department and triage nurse.

## 2018-05-30 ENCOUNTER — Other Ambulatory Visit: Payer: Self-pay

## 2018-05-30 ENCOUNTER — Inpatient Hospital Stay (HOSPITAL_BASED_OUTPATIENT_CLINIC_OR_DEPARTMENT_OTHER): Payer: Medicare Other | Admitting: Medical

## 2018-05-30 ENCOUNTER — Emergency Department (HOSPITAL_COMMUNITY): Payer: Medicare Other

## 2018-05-30 ENCOUNTER — Inpatient Hospital Stay: Payer: Medicare Other

## 2018-05-30 ENCOUNTER — Encounter (HOSPITAL_COMMUNITY): Payer: Self-pay | Admitting: *Deleted

## 2018-05-30 ENCOUNTER — Inpatient Hospital Stay (HOSPITAL_COMMUNITY)
Admission: EM | Admit: 2018-05-30 | Discharge: 2018-06-21 | DRG: 393 | Disposition: A | Discharge status: Skilled Nursing Facility | Payer: Medicare Other | Attending: Family Medicine | Admitting: Family Medicine

## 2018-05-30 ENCOUNTER — Inpatient Hospital Stay (HOSPITAL_COMMUNITY): Payer: Medicare Other

## 2018-05-30 VITALS — BP 80/61 | HR 67 | Temp 98.2°F | Resp 20

## 2018-05-30 DIAGNOSIS — N1330 Unspecified hydronephrosis: Secondary | ICD-10-CM | POA: Diagnosis present

## 2018-05-30 DIAGNOSIS — K30 Functional dyspepsia: Secondary | ICD-10-CM | POA: Diagnosis present

## 2018-05-30 DIAGNOSIS — G893 Neoplasm related pain (acute) (chronic): Secondary | ICD-10-CM | POA: Diagnosis present

## 2018-05-30 DIAGNOSIS — K649 Unspecified hemorrhoids: Secondary | ICD-10-CM | POA: Diagnosis present

## 2018-05-30 DIAGNOSIS — I252 Old myocardial infarction: Secondary | ICD-10-CM

## 2018-05-30 DIAGNOSIS — R05 Cough: Secondary | ICD-10-CM

## 2018-05-30 DIAGNOSIS — T451X5A Adverse effect of antineoplastic and immunosuppressive drugs, initial encounter: Secondary | ICD-10-CM | POA: Diagnosis present

## 2018-05-30 DIAGNOSIS — R1032 Left lower quadrant pain: Secondary | ICD-10-CM

## 2018-05-30 DIAGNOSIS — R058 Other specified cough: Secondary | ICD-10-CM

## 2018-05-30 DIAGNOSIS — C3432 Malignant neoplasm of lower lobe, left bronchus or lung: Secondary | ICD-10-CM

## 2018-05-30 DIAGNOSIS — R1084 Generalized abdominal pain: Secondary | ICD-10-CM

## 2018-05-30 DIAGNOSIS — I513 Intracardiac thrombosis, not elsewhere classified: Secondary | ICD-10-CM | POA: Diagnosis present

## 2018-05-30 DIAGNOSIS — K5909 Other constipation: Secondary | ICD-10-CM | POA: Diagnosis not present

## 2018-05-30 DIAGNOSIS — C7951 Secondary malignant neoplasm of bone: Secondary | ICD-10-CM | POA: Diagnosis present

## 2018-05-30 DIAGNOSIS — I240 Acute coronary thrombosis not resulting in myocardial infarction: Secondary | ICD-10-CM

## 2018-05-30 DIAGNOSIS — K625 Hemorrhage of anus and rectum: Secondary | ICD-10-CM

## 2018-05-30 DIAGNOSIS — H269 Unspecified cataract: Secondary | ICD-10-CM | POA: Diagnosis present

## 2018-05-30 DIAGNOSIS — Z79891 Long term (current) use of opiate analgesic: Secondary | ICD-10-CM

## 2018-05-30 DIAGNOSIS — R627 Adult failure to thrive: Secondary | ICD-10-CM | POA: Diagnosis present

## 2018-05-30 DIAGNOSIS — Z1612 Extended spectrum beta lactamase (ESBL) resistance: Secondary | ICD-10-CM | POA: Diagnosis not present

## 2018-05-30 DIAGNOSIS — R159 Full incontinence of feces: Secondary | ICD-10-CM

## 2018-05-30 DIAGNOSIS — Z79899 Other long term (current) drug therapy: Secondary | ICD-10-CM

## 2018-05-30 DIAGNOSIS — A499 Bacterial infection, unspecified: Secondary | ICD-10-CM | POA: Diagnosis not present

## 2018-05-30 DIAGNOSIS — C787 Secondary malignant neoplasm of liver and intrahepatic bile duct: Secondary | ICD-10-CM | POA: Diagnosis present

## 2018-05-30 DIAGNOSIS — R5383 Other fatigue: Secondary | ICD-10-CM

## 2018-05-30 DIAGNOSIS — Z888 Allergy status to other drugs, medicaments and biological substances status: Secondary | ICD-10-CM

## 2018-05-30 DIAGNOSIS — E7800 Pure hypercholesterolemia, unspecified: Secondary | ICD-10-CM | POA: Diagnosis present

## 2018-05-30 DIAGNOSIS — R32 Unspecified urinary incontinence: Secondary | ICD-10-CM

## 2018-05-30 DIAGNOSIS — M8448XA Pathological fracture, other site, initial encounter for fracture: Secondary | ICD-10-CM | POA: Diagnosis present

## 2018-05-30 DIAGNOSIS — G834 Cauda equina syndrome: Secondary | ICD-10-CM | POA: Diagnosis present

## 2018-05-30 DIAGNOSIS — Z923 Personal history of irradiation: Secondary | ICD-10-CM

## 2018-05-30 DIAGNOSIS — F17210 Nicotine dependence, cigarettes, uncomplicated: Secondary | ICD-10-CM | POA: Diagnosis present

## 2018-05-30 DIAGNOSIS — G992 Myelopathy in diseases classified elsewhere: Secondary | ICD-10-CM | POA: Diagnosis present

## 2018-05-30 DIAGNOSIS — D61810 Antineoplastic chemotherapy induced pancytopenia: Secondary | ICD-10-CM | POA: Diagnosis present

## 2018-05-30 DIAGNOSIS — Z79890 Hormone replacement therapy (postmenopausal): Secondary | ICD-10-CM

## 2018-05-30 DIAGNOSIS — N3080 Other cystitis without hematuria: Secondary | ICD-10-CM | POA: Diagnosis present

## 2018-05-30 DIAGNOSIS — S24112A Complete lesion at T2-T6 level of thoracic spinal cord, initial encounter: Secondary | ICD-10-CM | POA: Diagnosis present

## 2018-05-30 DIAGNOSIS — I5032 Chronic diastolic (congestive) heart failure: Secondary | ICD-10-CM | POA: Diagnosis present

## 2018-05-30 DIAGNOSIS — Z66 Do not resuscitate: Secondary | ICD-10-CM | POA: Diagnosis present

## 2018-05-30 DIAGNOSIS — R262 Difficulty in walking, not elsewhere classified: Secondary | ICD-10-CM | POA: Diagnosis present

## 2018-05-30 DIAGNOSIS — Z8619 Personal history of other infectious and parasitic diseases: Secondary | ICD-10-CM

## 2018-05-30 DIAGNOSIS — Z8249 Family history of ischemic heart disease and other diseases of the circulatory system: Secondary | ICD-10-CM

## 2018-05-30 DIAGNOSIS — G9529 Other cord compression: Secondary | ICD-10-CM | POA: Diagnosis not present

## 2018-05-30 DIAGNOSIS — Z7952 Long term (current) use of systemic steroids: Secondary | ICD-10-CM

## 2018-05-30 DIAGNOSIS — I2510 Atherosclerotic heart disease of native coronary artery without angina pectoris: Secondary | ICD-10-CM | POA: Diagnosis present

## 2018-05-30 DIAGNOSIS — R1030 Lower abdominal pain, unspecified: Secondary | ICD-10-CM | POA: Diagnosis not present

## 2018-05-30 DIAGNOSIS — E785 Hyperlipidemia, unspecified: Secondary | ICD-10-CM | POA: Diagnosis present

## 2018-05-30 DIAGNOSIS — K219 Gastro-esophageal reflux disease without esophagitis: Secondary | ICD-10-CM | POA: Diagnosis present

## 2018-05-30 DIAGNOSIS — R609 Edema, unspecified: Secondary | ICD-10-CM | POA: Diagnosis not present

## 2018-05-30 DIAGNOSIS — E860 Dehydration: Secondary | ICD-10-CM | POA: Diagnosis present

## 2018-05-30 DIAGNOSIS — I8390 Asymptomatic varicose veins of unspecified lower extremity: Secondary | ICD-10-CM | POA: Diagnosis present

## 2018-05-30 DIAGNOSIS — D500 Iron deficiency anemia secondary to blood loss (chronic): Secondary | ICD-10-CM | POA: Diagnosis present

## 2018-05-30 DIAGNOSIS — Z91041 Radiographic dye allergy status: Secondary | ICD-10-CM

## 2018-05-30 DIAGNOSIS — E038 Other specified hypothyroidism: Secondary | ICD-10-CM | POA: Diagnosis not present

## 2018-05-30 DIAGNOSIS — R590 Localized enlarged lymph nodes: Secondary | ICD-10-CM | POA: Diagnosis present

## 2018-05-30 DIAGNOSIS — E039 Hypothyroidism, unspecified: Secondary | ICD-10-CM | POA: Diagnosis present

## 2018-05-30 DIAGNOSIS — Z87440 Personal history of urinary (tract) infections: Secondary | ICD-10-CM

## 2018-05-30 DIAGNOSIS — I340 Nonrheumatic mitral (valve) insufficiency: Secondary | ICD-10-CM | POA: Diagnosis not present

## 2018-05-30 DIAGNOSIS — G8220 Paraplegia, unspecified: Secondary | ICD-10-CM | POA: Diagnosis present

## 2018-05-30 DIAGNOSIS — J449 Chronic obstructive pulmonary disease, unspecified: Secondary | ICD-10-CM | POA: Diagnosis present

## 2018-05-30 DIAGNOSIS — H9190 Unspecified hearing loss, unspecified ear: Secondary | ICD-10-CM | POA: Diagnosis present

## 2018-05-30 DIAGNOSIS — C781 Secondary malignant neoplasm of mediastinum: Secondary | ICD-10-CM | POA: Diagnosis not present

## 2018-05-30 LAB — URINALYSIS, ROUTINE W REFLEX MICROSCOPIC
Bilirubin Urine: NEGATIVE
Glucose, UA: NEGATIVE mg/dL
Ketones, ur: NEGATIVE mg/dL
Nitrite: NEGATIVE
Protein, ur: 30 mg/dL — AB
Specific Gravity, Urine: 1.023 (ref 1.005–1.030)
pH: 6 (ref 5.0–8.0)

## 2018-05-30 LAB — BASIC METABOLIC PANEL
Anion gap: 11 (ref 5–15)
BUN: 30 mg/dL — ABNORMAL HIGH (ref 8–23)
CO2: 22 mmol/L (ref 22–32)
Calcium: 8.2 mg/dL — ABNORMAL LOW (ref 8.9–10.3)
Chloride: 100 mmol/L (ref 98–111)
Creatinine, Ser: 0.93 mg/dL (ref 0.44–1.00)
GFR calc Af Amer: >60 mL/min (ref >60)
GFR calc non Af Amer: 58 mL/min — ABNORMAL LOW (ref >60)
Glucose, Bld: 96 mg/dL (ref 70–99)
Potassium: 4 mmol/L (ref 3.5–5.1)
Sodium: 133 mmol/L — ABNORMAL LOW (ref 135–145)

## 2018-05-30 LAB — CBC
HCT: 32.6 % — ABNORMAL LOW (ref 36.0–46.0)
Hemoglobin: 10.2 g/dL — ABNORMAL LOW (ref 12.0–15.0)
MCH: 27.7 pg (ref 26.0–34.0)
MCHC: 31.3 g/dL (ref 30.0–36.0)
MCV: 88.6 fL (ref 80.0–100.0)
Platelets: 559 10*3/uL — ABNORMAL HIGH (ref 150–400)
RBC: 3.68 MIL/uL — ABNORMAL LOW (ref 3.87–5.11)
RDW: 17.3 % — ABNORMAL HIGH (ref 11.5–15.5)
WBC: 25.5 10*3/uL — ABNORMAL HIGH (ref 4.0–10.5)
nRBC: 0.1 % (ref 0.0–0.2)

## 2018-05-30 LAB — TYPE AND SCREEN
ABO/RH(D): A POS
Antibody Screen: NEGATIVE

## 2018-05-30 LAB — I-STAT CG4 LACTIC ACID, ED
Lactic Acid, Venous: 1.87 mmol/L (ref 0.5–1.9)
Lactic Acid, Venous: 2.35 mmol/L (ref 0.5–1.9)

## 2018-05-30 MED ORDER — SODIUM CHLORIDE 0.9 % IV BOLUS: 1000.0000 mL | Freq: Once | INTRAVENOUS | Status: DC

## 2018-05-30 MED ORDER — ENSURE ENLIVE PO LIQD
237.0000 mL | Freq: Two times a day (BID) | ORAL | Status: DC
  Administered 2018-05-31 – 2018-06-21 (×34): 237 mL via ORAL

## 2018-05-30 MED ORDER — FENTANYL 25 MCG/HR TD PT72
25.0000 ug | MEDICATED_PATCH | TRANSDERMAL | Status: DC
  Administered 2018-05-31 – 2018-06-06 (×3): 25 ug via TRANSDERMAL
  Filled 2018-05-30 (×3): qty 1

## 2018-05-30 MED ORDER — PROCHLORPERAZINE MALEATE 10 MG PO TABS
10.0000 mg | ORAL_TABLET | Freq: Four times a day (QID) | ORAL | Status: DC | PRN
  Administered 2018-06-08: 10 mg via ORAL
  Filled 2018-05-30: qty 1

## 2018-05-30 MED ORDER — PANTOPRAZOLE SODIUM 40 MG PO TBEC
40.0000 mg | DELAYED_RELEASE_TABLET | Freq: Every day | ORAL | Status: DC
  Administered 2018-05-31 – 2018-06-05 (×6): 40 mg via ORAL
  Filled 2018-05-30 (×6): qty 1

## 2018-05-30 MED ORDER — ONDANSETRON HCL 4 MG PO TABS
4.0000 mg | ORAL_TABLET | Freq: Four times a day (QID) | ORAL | Status: DC | PRN
  Filled 2018-05-30: qty 1

## 2018-05-30 MED ORDER — ACETAMINOPHEN 325 MG PO TABS: 650.0000 mg | ORAL_TABLET | Freq: Four times a day (QID) | ORAL | Status: DC | PRN

## 2018-05-30 MED ORDER — SODIUM CHLORIDE 0.9 % IV SOLN
Freq: Once | INTRAVENOUS | Status: AC
  Administered 2018-05-30: 16:00:00 via INTRAVENOUS
  Filled 2018-05-30: qty 250

## 2018-05-30 MED ORDER — SODIUM CHLORIDE 0.9% FLUSH
10.0000 mL | INTRAVENOUS | Status: DC | PRN
  Filled 2018-05-30: qty 10

## 2018-05-30 MED ORDER — SODIUM CHLORIDE 0.9 % IV SOLN
100.0000 mg/m2 | Freq: Once | INTRAVENOUS | Status: DC
  Filled 2018-05-30: qty 9

## 2018-05-30 MED ORDER — POLYETHYLENE GLYCOL 3350 17 G PO PACK: 17.0000 g | PACK | Freq: Every day | ORAL | Status: DC

## 2018-05-30 MED ORDER — LIDOCAINE VISCOUS HCL 2 % MT SOLN
15.0000 mL | OROMUCOSAL | Status: DC | PRN
  Administered 2018-05-31: 15 mL via OROMUCOSAL
  Filled 2018-05-30 (×2): qty 15

## 2018-05-30 MED ORDER — DEXAMETHASONE 4 MG PO TABS
4.0000 mg | ORAL_TABLET | Freq: Every day | ORAL | Status: DC
  Administered 2018-05-31 – 2018-06-21 (×22): 4 mg via ORAL
  Filled 2018-05-30 (×22): qty 1

## 2018-05-30 MED ORDER — HEPARIN SOD (PORK) LOCK FLUSH 100 UNIT/ML IV SOLN
500.0000 [IU] | Freq: Once | INTRAVENOUS | Status: DC | PRN
  Filled 2018-05-30: qty 5

## 2018-05-30 MED ORDER — ACETAMINOPHEN 650 MG RE SUPP: 650.0000 mg | Freq: Four times a day (QID) | RECTAL | Status: DC | PRN

## 2018-05-30 MED ORDER — DEXAMETHASONE SODIUM PHOSPHATE 10 MG/ML IJ SOLN
10.0000 mg | Freq: Once | INTRAMUSCULAR | Status: AC
  Administered 2018-05-30: 10 mg via INTRAVENOUS

## 2018-05-30 MED ORDER — NYSTATIN 100000 UNIT/ML MT SUSP
5.0000 mL | Freq: Four times a day (QID) | OROMUCOSAL | Status: DC
  Administered 2018-05-31 – 2018-06-21 (×78): 500000 [IU] via ORAL
  Filled 2018-05-30 (×75): qty 5

## 2018-05-30 MED ORDER — SODIUM CHLORIDE 0.9 % IV BOLUS
1000.0000 mL | Freq: Once | INTRAVENOUS | Status: AC
  Administered 2018-05-30: 1000 mL via INTRAVENOUS

## 2018-05-30 MED ORDER — IOPAMIDOL (ISOVUE-300) INJECTION 61%
100.0000 mL | Freq: Once | INTRAVENOUS | Status: AC | PRN
  Administered 2018-05-30: 100 mL via INTRAVENOUS

## 2018-05-30 MED ORDER — IOPAMIDOL (ISOVUE-300) INJECTION 61%
INTRAVENOUS | Status: AC
  Filled 2018-05-30: qty 100

## 2018-05-30 MED ORDER — SODIUM CHLORIDE 0.9% FLUSH
3.0000 mL | Freq: Two times a day (BID) | INTRAVENOUS | Status: DC
  Administered 2018-06-01 – 2018-06-20 (×16): 3 mL via INTRAVENOUS

## 2018-05-30 MED ORDER — ONDANSETRON HCL 4 MG/2ML IJ SOLN
4.0000 mg | Freq: Four times a day (QID) | INTRAMUSCULAR | Status: DC | PRN
  Administered 2018-06-08 – 2018-06-21 (×6): 4 mg via INTRAVENOUS
  Filled 2018-05-30 (×7): qty 2

## 2018-05-30 MED ORDER — DEXAMETHASONE SODIUM PHOSPHATE 10 MG/ML IJ SOLN
INTRAMUSCULAR | Status: AC
  Filled 2018-05-30: qty 1

## 2018-05-30 MED ORDER — SENNA 8.6 MG PO TABS
1.0000 | ORAL_TABLET | Freq: Two times a day (BID) | ORAL | Status: DC
  Administered 2018-05-31 – 2018-06-20 (×30): 8.6 mg via ORAL
  Filled 2018-05-30 (×37): qty 1

## 2018-05-30 MED ORDER — DEXTROSE-NACL 5-0.45 % IV SOLN
INTRAVENOUS | Status: AC
  Administered 2018-05-30: 23:00:00 via INTRAVENOUS

## 2018-05-30 MED ORDER — LEVOTHYROXINE SODIUM 75 MCG PO TABS
75.0000 ug | ORAL_TABLET | Freq: Every day | ORAL | Status: DC
  Administered 2018-06-01 – 2018-06-21 (×21): 75 ug via ORAL
  Filled 2018-05-30 (×21): qty 1

## 2018-05-30 MED ORDER — SODIUM CHLORIDE (PF) 0.9 % IJ SOLN
INTRAMUSCULAR | Status: AC
  Filled 2018-05-30: qty 50

## 2018-05-30 MED ORDER — ENOXAPARIN SODIUM 40 MG/0.4ML ~~LOC~~ SOLN: 40.0000 mg | Freq: Every day | SUBCUTANEOUS | Status: DC

## 2018-05-30 NOTE — ED Provider Notes (Addendum)
I saw and evaluated the patient, reviewed the resident's note and I agree with the findings and plan.  EKG: None 77 year old female with history of diverticulosis presents with bright red blood per rectum.  On abdominal exam she is diffusely tender.  She was hypotensive as well.  Have ordered IV fluids on her as well as abdominal CT.   Lacretia Leigh, MD 05/30/18 [place]    Lacretia Leigh, MD 05/30/18 [phone]

## 2018-05-30 NOTE — Progress Notes (Signed)
  Radiation Oncology         [phone] ________________________________  Name: [name] [name] MRN: [id number]  Date: 05/15/2018  DOB: 1941/01/03  End of Treatment Note  Diagnosis:   77 y.o. female with T6 thoracic spinal cord compression from small cell carcinoma of the left upper lung     Indication for treatment:  Palliative       Radiation treatment dates:   05/02/2018 - 05/15/2018  Site/dose:   The thoracic spine was treated to 30 Gy in 10 fractions of 3 Gy.  Beams/energy:   3D / 10X, 15X Photon  Narrative: The patient tolerated radiation treatment well.   No difficulties.  Plan: The patient has completed radiation treatment. The patient will return to radiation oncology clinic for routine followup in one month. I advised her to call or return sooner if she has any questions or concerns related to her recovery or treatment. ________________________________  Sheral Apley. Tammi Klippel, M.D.  This document serves as a record of services personally performed by Tyler Pita, MD. It was created on his behalf by Rae Lips, a trained medical scribe. The creation of this record is based on the scribe's personal observations and the provider's statements to them. This document has been checked and approved by the attending provider.

## 2018-05-30 NOTE — ED Triage Notes (Signed)
Coming from [facility]-- hx of lung cancer with metastasis. Noticed blood in her stool at the cancer center today. +hypotensive. Currently receiving chemotherapy treatment, completed radiation treatment a few weeks ago.

## 2018-05-30 NOTE — ED Notes (Signed)
ED Provider at bedside. 

## 2018-05-30 NOTE — Progress Notes (Signed)
Patient presents today for Etoposide infusion.  When assisted to the bathroom, patient was spotting bright red blood from her rectum. Patient and family denies any history of hemorrhoid. Rectal assessment was normal and mucousy. Patient was assisted back to the treatment area. She continued to complain of severe pain and discomfort in her stomach. Her son reports that patient is not her usual self. Lucianne Lei, [facility] was notified and upon assessment advised to hold etoposide treatment and transfer patient to the ER for further evaluation. Patient transferred in company of her son to the ER.

## 2018-05-30 NOTE — ED Notes (Signed)
ED TO INPATIENT HANDOFF REPORT  Name/Age/Gender [name] [name] 77 y.o. female  Code Status    Code Status Orders  (From admission, onward)         Start     Ordered   05/30/18 2200  Full code  Continuous     05/30/18 2204        Code Status History    Date Active Date Inactive Code Status Order ID Comments User Context   04/25/2018 2135 05/11/2018 1657 Full Code [id number]  Shela Leff, MD ED   04/01/2018 2052 04/08/2018 1540 Full Code [id number]  Etta Quill, DO ED   03/23/2018 1030 03/27/2018 1901 Full Code [id number]  Toy Baker, MD Inpatient    Advance Directive Documentation     Most Recent Value  Type of Advance Directive  Living will  Pre-existing out of facility DNR order (yellow form or pink MOST form)  -  "MOST" Form in Place?  -      Home/SNF/Other Home  Chief Complaint bolld in stools  Level of Care/Admitting Diagnosis ED Disposition    ED Disposition Condition [place]: [place] [[id number]]  Level of Care: Telemetry [5]  Admit to tele based on following criteria: Other see comments  Comments: LV thrombus  Diagnosis: LV (left ventricular) mural thrombus without MI [[id number]]  Admitting Physician: Lenore Cordia [[id number]]  Attending Physician: Lenore Cordia [[id number]]  Estimated length of stay: past midnight tomorrow  Certification:: I certify this patient will need inpatient services for at least 2 midnights  PT Class (Do Not Modify): Inpatient [101]  PT Acc Code (Do Not Modify): Private [1]       Medical History Past Medical History:  Diagnosis Date  . Asymptomatic varicose veins   . Cataract    beginnings  . Cigarette smoker   . COPD (chronic obstructive pulmonary disease) ([place])   . Diverticulosis of colon (without mention of hemorrhage)   . Headache(784.0)   . Hearing loss    wears hearing aides  . Lumbar back pain   . Pure hypercholesterolemia   . Stress disorder, acute    . Varicose veins     Allergies Allergies  Allergen Reactions  . Atorvastatin     Intolerant to all statins   . Cyclobenzaprine Other (See Comments)    Bradycardia, hypotension  . Ezetimibe      INTOL to Zetia  . Rosuvastatin     intolerant to all statins  . Gadolinium Derivatives Nausea Only    Pt became very nauseous after gadolinium administration//lh  . Nicoderm [Nicotine] Rash    Unable to use the patches---caused severe rash to area placed    IV Location/Drains/Wounds Patient Lines/Drains/Airways Status   Active Line/Drains/Airways    Name:   Placement date:   Placement time:   Site:   Days:   PICC Single Lumen 04/07/18 PICC Right Cephalic 38 cm 0 cm   97/35/32    9924    Cephalic   53   External Urinary Catheter   04/01/18    1639    -   18          Labs/Imaging Results for orders placed or performed during the hospital encounter of 05/30/18 (from the past 48 hour(s))  Basic metabolic panel     Status: Abnormal   Collection Time: 05/30/18  5:24 PM  Result Value Ref Range   Sodium 133 (L) 135 - 145 mmol/L  Potassium 4.0 3.5 - 5.1 mmol/L   Chloride 100 98 - 111 mmol/L   CO2 22 22 - 32 mmol/L   Glucose, Bld 96 70 - 99 mg/dL   BUN 30 (H) 8 - 23 mg/dL   Creatinine, Ser 0.93 0.44 - 1.00 mg/dL   Calcium 8.2 (L) 8.9 - 10.3 mg/dL   GFR calc non Af Amer 58 (L) >60 mL/min   GFR calc Af Amer >60 >60 mL/min   Anion gap 11 5 - 15    Comment: Performed at [facility], [place] [address] [place], [place] [address]  CBC     Status: Abnormal   Collection Time: 05/30/18  5:24 PM  Result Value Ref Range   WBC 25.5 (H) 4.0 - 10.5 K/uL   RBC 3.68 (L) 3.87 - 5.11 MIL/uL   Hemoglobin 10.2 (L) 12.0 - 15.0 g/dL   HCT 32.6 (L) 36.0 - 46.0 %   MCV 88.6 80.0 - 100.0 fL   MCH 27.7 26.0 - 34.0 pg   MCHC 31.3 30.0 - 36.0 g/dL   RDW 17.3 (H) 11.5 - 15.5 %   Platelets 559 (H) 150 - 400 K/uL   nRBC 0.1 0.0 - 0.2 %    Comment: Performed at [facility], [place] [address], [place], [place] [address]  Urinalysis, Routine w reflex microscopic     Status: Abnormal   Collection Time: 05/30/18  5:29 PM  Result Value Ref Range   Color, Urine YELLOW YELLOW   APPearance HAZY (A) CLEAR   Specific Gravity, Urine 1.023 1.005 - 1.030   pH 6.0 5.0 - 8.0   Glucose, UA NEGATIVE NEGATIVE mg/dL   Hgb urine dipstick LARGE (A) NEGATIVE   Bilirubin Urine NEGATIVE NEGATIVE   Ketones, ur NEGATIVE NEGATIVE mg/dL   Protein, ur 30 (A) NEGATIVE mg/dL   Nitrite NEGATIVE NEGATIVE   Leukocytes, UA LARGE (A) NEGATIVE   RBC / HPF 6-10 0 - 5 RBC/hpf   WBC, UA >50 (H) 0 - 5 WBC/hpf   Bacteria, UA FEW (A) NONE SEEN   WBC Clumps PRESENT    Mucus PRESENT    Amorphous Crystal PRESENT     Comment: Performed at [facility], [place] [address], [place], [place] [address]  Type and screen [place]     Status: None   Collection Time: 05/30/18  6:33 PM  Result Value Ref Range   ABO/RH(D) A POS    Antibody Screen NEG    Sample Expiration      06/02/2018 Performed at [facility], [place] [address], [place], [place] [address]   ABO/Rh     Status: None (Preliminary result)   Collection Time: 05/30/18  6:33 PM  Result Value Ref Range   ABO/RH(D)      A POS Performed at [facility], [place] [address], [place], [place] [address]   I-Stat CG4 Lactic Acid, ED     Status: None   Collection Time: 05/30/18  6:41 PM  Result Value Ref Range   Lactic Acid, Venous 1.87 0.5 - 1.9 mmol/L  I-Stat CG4 Lactic Acid, ED     Status: Abnormal   Collection Time: 05/30/18  8:51 PM  Result Value Ref Range   Lactic Acid, Venous 2.35 (HH) 0.5 - 1.9 mmol/L   Comment NOTIFIED PHYSICIAN    Ct Abdomen Pelvis W Contrast  Result Date: 05/30/2018 CLINICAL DATA:  Abdominal pain. Clinical concern for diverticulitis. Undergoing chemotherapy  for metastatic lung cancer. Recently completed radiation treatment. Current  smoker. EXAM: CT ABDOMEN AND PELVIS WITH CONTRAST TECHNIQUE: Multidetector CT imaging of the abdomen and pelvis was performed using the standard protocol following bolus administration of intravenous contrast. CONTRAST:  161mL ISOVUE-300 IOPAMIDOL (ISOVUE-300) INJECTION 61% COMPARISON:  04/01/2018. Chest, abdomen and pelvis CT dated 04/28/2018. FINDINGS: Lower chest: Oval area of low density within the apical portion of the left ventricle. This measures 1.9 x 1.2 cm on image number 9 series 2 and 1.9 cm in length on sagittal image number 93. This was not previously present. Mild bibasilar atelectasis/scarring. Mild diffuse peribronchial thickening and bullous changes. Hepatobiliary: The previously demonstrated 3.8 x 2.7 cm mass in segment 8 of the liver on the right currently measures 1.6 x 1.3 cm in maximum diameter on image number 9 series 2. The previously demonstrated 3.4 x 2.8 cm oval mass in segment 4B/5, currently measures 2.1 cm in maximum diameter on image number 24 series 2. The previously demonstrated 1.5 x 1.5 cm mass in the central aspect of segment 4A is not visualized today. Also demonstrated is interval visualization of a 6 mm probable cyst in the posterior segment of the right lobe of the liver on image number 16 series 2. Unremarkable gallbladder. Pancreas: Diffuse pancreatic atrophy, most pronounced involving the head and neck. Spleen: Normal in size without focal abnormality. Adrenals/Urinary Tract: Normal appearing adrenal glands. Small right renal cysts. Small left renal vascular calcification. Mild diffuse wall thickening and enhancement of the right ureter and right renal collecting system with mild dilatation. Normal appearing left ureter. Gas throughout the walls of the urinary bladder with nondependent air or gas in the bladder. Stomach/Bowel: Large amount of stool throughout the colon. Mild sigmoid colon diverticulosis without evidence of diverticulitis. Unremarkable stomach and small  bowel. No evidence of appendicitis. Vascular/Lymphatic: Atheromatous arterial calcifications without aneurysm. No enlarged lymph nodes. Reproductive: Uterus and bilateral adnexa are unremarkable. Other: Small left inguinal hernia containing fat. Musculoskeletal: Bilateral sacral ala fractures are again demonstrated with interval diffuse patchy bone sclerosis and nonunion. Also again demonstrated are right inferior pubic ramus and ischial/anterior acetabular fractures. Mild increase in associated callus formation with nonunion. Interval visualization of a mildly comminuted right superior pubic ramus fracture with patchy bone sclerosis. Moderate right hip degenerative changes. Lumbar and lower thoracic spine degenerative changes with progressive patchy sclerosis involving multiple lumbar and lower thoracic vertebral bodies. IMPRESSION: 1. Interval emphysematous cystitis. 2. Interval visualization of a 1.9 x 1.2 x 1.9 cm apical left ventricular clot. This places the patient at increased risk for embolic stroke or other vascular occlusions. 3. Interval mild right hydronephrosis and hydroureter with mild diffuse uroepithelial thickening and enhancement, compatible with infection. 4. Interval decrease in size of the previously demonstrated liver metastases. 5. Progressive sclerotic bony metastatic disease with nonunion of multiple previous fractures as described above as well as nonunion of an interval comminuted superior pubic ramus fracture on the right. 6. Mild changes of COPD and chronic bronchitis. 7. Large amount of stool throughout the colon. 8. Mild sigmoid diverticulosis without evidence of diverticulitis. These results were called by telephone at the time of interpretation on 05/30/2018 at 8:11 pm to Dr. Lacretia Leigh , who verbally acknowledged these results. Electronically Signed   By: Claudie Revering M.D.   On: 05/30/2018 20:23   None  Pending Labs Unresulted Labs (From admission, onward)    Start      Ordered   06/06/18 0500  Creatinine, serum  (enoxaparin (LOVENOX)  CrCl >/= 30 ml/min)  Weekly,   R    Comments:  while on enoxaparin therapy    05/30/18 2204   05/31/18 8127  Basic metabolic panel  Tomorrow morning,   R     05/30/18 2204   05/31/18 0500  CBC  Tomorrow morning,   R     05/30/18 2204   05/30/18 2208  Culture, Urine  Add-on,   R     05/30/18 2207          Vitals/Pain Today's Vitals   05/30/18 2100 05/30/18 2111 05/30/18 2130 05/30/18 2200  BP: 128/76 128/76 (!) 106/57 (!) 107/59  Pulse: 87 71 68 65  Resp: 17 (!) 21 (!) 22 17  Temp:      SpO2: 95% 95% 93% 93%    Isolation Precautions No active isolations  Medications Medications  iopamidol (ISOVUE-300) 61 % injection (has no administration in time range)  sodium chloride (PF) 0.9 % injection (has no administration in time range)  enoxaparin (LOVENOX) injection 40 mg (has no administration in time range)  sodium chloride flush (NS) 0.9 % injection 3 mL (has no administration in time range)  dextrose 5 %-0.45 % sodium chloride infusion (has no administration in time range)  acetaminophen (TYLENOL) tablet 650 mg (has no administration in time range)    Or  acetaminophen (TYLENOL) suppository 650 mg (has no administration in time range)  ondansetron (ZOFRAN) tablet 4 mg (has no administration in time range)    Or  ondansetron (ZOFRAN) injection 4 mg (has no administration in time range)  dexamethasone (DECADRON) tablet 4 mg (has no administration in time range)  feeding supplement (ENSURE ENLIVE) (ENSURE ENLIVE) liquid 237 mL (has no administration in time range)  fentaNYL (DURAGESIC - dosed mcg/hr) patch 25 mcg (has no administration in time range)  levothyroxine (SYNTHROID, LEVOTHROID) tablet 75 mcg (has no administration in time range)  lidocaine (XYLOCAINE) 2 % viscous mouth solution 15 mL (has no administration in time range)  nystatin (MYCOSTATIN) 100000 UNIT/ML suspension 500,000 Units (has no  administration in time range)  pantoprazole (PROTONIX) EC tablet 40 mg (has no administration in time range)  polyethylene glycol (MIRALAX / GLYCOLAX) packet 17 g (has no administration in time range)  prochlorperazine (COMPAZINE) tablet 10 mg (has no administration in time range)  senna (SENOKOT) tablet 8.6 mg (has no administration in time range)  sodium chloride 0.9 % bolus 1,000 mL (0 mLs Intravenous Stopped 05/30/18 1836)  iopamidol (ISOVUE-300) 61 % injection 100 mL (100 mLs Intravenous Contrast Given 05/30/18 1926)    Mobility manual wheelchair

## 2018-05-30 NOTE — ED Provider Notes (Signed)
[facility] DEPT Provider Note   CSN: [id number] Arrival date & time: 05/30/18  1705     History   Chief Complaint Chief Complaint  Patient presents with  . Blood In Stools    HPI [name] [name] is a 77 y.o. female with PMH diverticulosis, GERD, COPD, lung cancer metastasized to the spine and liver presenting today after bowel movement with bright red blood while [facility] the [facility] to have [facility]. She states this has never happened prior to today. She has noted no other changes in bowel movements. She states she has been feeling weaker the past few days. She denies nausea, abdominal pain, she states she has been eating, and has not had changes in bowel movements prior to this. She has gone through multiple rounds of radiation along her lower spine for metastatic cancer which resulted in the inability to stand and weakness in her LE. She has occasional vomiting but not increased from usual since starting chemotherapy. She states she has reflux and belches often.   HPI  Past Medical History:  Diagnosis Date  . Asymptomatic varicose veins   . Cataract    beginnings  . Cigarette smoker   . COPD (chronic obstructive pulmonary disease) ([place])   . Diverticulosis of colon (without mention of hemorrhage)   . Headache(784.0)   . Hearing loss    wears hearing aides  . Lumbar back pain   . Pure hypercholesterolemia   . Stress disorder, acute   . Varicose veins     Patient Active Problem List   Diagnosis Date Noted  . LV (left ventricular) mural thrombus without MI 05/30/2018  . BRBPR (bright red blood per rectum) 05/30/2018  . Goals of care, counseling/discussion 05/14/2018  . Encounter for antineoplastic chemotherapy 05/14/2018  . Small cell carcinoma of lower lobe of left lung ([place]) 05/06/2018  . Spinal cord compression due to malignant neoplasm metastatic to spine ([place]) 04/30/2018  . Presumed cancer of left upper lobe of lung pending biopsy ([place])  04/30/2018  . Carbapenem-resistant bacterial infection 04/25/2018  . Liver mass 04/10/2018  . Aortic atherosclerosis ([place]) 04/10/2018  . Elevated BP without diagnosis of hypertension 04/05/2018  . Acute lower UTI 04/01/2018  . Constipation 04/01/2018  . Hyponatremia 03/23/2018  . Debility 03/23/2018  . Pubic ramus fracture ([place]) 03/23/2018  . Primary osteoarthritis of right hip 02/07/2018  . Belching 02/01/2018  . Acquired trigger finger of right middle finger 07/20/2017  . Varicose veins 12/02/2013  . OA (osteoarthritis) 08/15/2012  . Hearing loss 08/16/2009  . Hypothyroidism 10/12/2007  . CIGARETTE SMOKER 10/09/2007  . HYPERCHOLESTEROLEMIA 10/08/2007  . COPD (chronic obstructive pulmonary disease) ([place]) 10/08/2007  . BACK PAIN, LUMBAR 10/08/2007    Past Surgical History:  Procedure Laterality Date  . COLONOSCOPY  04-22-2004  . LUMBAR SPINE SURGERY     [phone]  . varicose vein treatment     at [facility] vein center     OB History   No obstetric history on file.      Home Medications    Prior to Admission medications   Medication Sig Start Date End Date Taking? Authorizing Provider  ALPRAZolam Duanne Moron) 0.5 MG tablet Take 1 tablet (0.5 mg total) by mouth 2 (two) times daily as needed for anxiety or sleep. 05/11/18  Yes Shelly Coss, MD  dexamethasone (DECADRON) 4 MG tablet Take 1 tablet (4 mg total) by mouth daily. 05/28/18  Yes Alla Feeling, NP  feeding supplement, ENSURE ENLIVE, (ENSURE ENLIVE) LIQD  Take 237 mLs by mouth 2 (two) times daily between meals. 05/11/18  Yes Shelly Coss, MD  fentaNYL (DURAGESIC - DOSED MCG/HR) 25 MCG/HR patch Place 1 patch (25 mcg total) onto the skin every 3 (three) days. 05/12/18  Yes Shelly Coss, MD  levothyroxine (SYNTHROID, LEVOTHROID) 75 MCG tablet TAKE 1 TABLET(75 MCG) BY MOUTH DAILY Patient taking differently: Take 75 mcg by mouth daily before breakfast.  04/03/18  Yes Marin Olp, MD  lidocaine (XYLOCAINE) 2 %  solution Use as directed 15 mLs in the mouth or throat every 3 (three) hours as needed for mouth pain (mouth ulcers). 05/11/18  Yes Shelly Coss, MD  nicotine polacrilex (NICORETTE) 2 MG gum Take 1 each (2 mg total) by mouth as needed for smoking cessation. 03/26/18  Yes Patrecia Pour, MD  nystatin (MYCOSTATIN) 100000 UNIT/ML suspension Take 5 mLs (500,000 Units total) by mouth 4 (four) times daily. 05/11/18  Yes Shelly Coss, MD  omeprazole (PRILOSEC) 40 MG capsule Take 1 capsule (40 mg total) by mouth daily. 05/28/18  Yes Alla Feeling, NP  ondansetron (ZOFRAN-ODT) 4 MG disintegrating tablet Take 1 tablet (4 mg total) by mouth every 8 (eight) hours as needed for nausea or vomiting. 03/04/18  Yes Marin Olp, MD  polyethylene glycol (MIRALAX / GLYCOLAX) packet Take 17 g by mouth daily. 05/11/18  Yes Shelly Coss, MD  prochlorperazine (COMPAZINE) 10 MG tablet Take 1 tablet (10 mg total) by mouth every 6 (six) hours as needed for nausea or vomiting. 05/28/18  Yes Alla Feeling, NP  senna (SENOKOT) 8.6 MG TABS tablet Take 1 tablet (8.6 mg total) by mouth 2 (two) times daily. 03/26/18  Yes Patrecia Pour, MD    Family History Family History  Problem Relation Age of Onset  . Diabetes Father   . Heart attack Mother 52       nonsmoker  . Colon cancer Neg Hx   . Rectal cancer Neg Hx   . Stomach cancer Neg Hx     Social History Social History   Tobacco Use  . Smoking status: Current Every Day Smoker    Packs/day: 0.50    Years: 40.00    Pack years: 20.00    Types: Cigarettes  . Smokeless tobacco: Never Used  . Tobacco comment: 1/2-3/4 of a pk a day   Substance Use Topics  . Alcohol use: No    Alcohol/week: 0.0 standard drinks  . Drug use: No     Allergies   Atorvastatin; Cyclobenzaprine; Ezetimibe; Rosuvastatin; Gadolinium derivatives; and Nicoderm [nicotine]   Review of Systems Review of Systems ROS reviewed and are negative for acute change, except as noted in  the HPI.    Physical Exam Updated Vital Signs BP (!) 107/59   Pulse 65   Temp (!) 97.5 F (36.4 C)   Resp 17   SpO2 93%   Physical Exam Exam conducted with a chaperone present.  Constitutional:      General: She is sleeping. She is not in acute distress.    Appearance: She is not ill-appearing or diaphoretic.  Cardiovascular:     Rate and Rhythm: Tachycardia present.  Pulmonary:     Effort: Pulmonary effort is normal.  Abdominal:     General: There is no distension.     Palpations: Abdomen is soft.     Tenderness: There is abdominal tenderness in the left upper quadrant and left lower quadrant.  Genitourinary:    Exam position: Knee-chest position.  Rectum: Internal hemorrhoid (minimal amt bright red blood) present. No tenderness or anal fissure.  Neurological:     Mental Status: She is oriented to person, place, and time and easily aroused. She is lethargic.      ED Treatments / Results  Labs (all labs ordered are listed, but only abnormal results are displayed) Labs Reviewed  BASIC METABOLIC PANEL - Abnormal; Notable for the following components:      Result Value   Sodium 133 (*)    BUN 30 (*)    Calcium 8.2 (*)    GFR calc non Af Amer 58 (*)    All other components within normal limits  CBC - Abnormal; Notable for the following components:   WBC 25.5 (*)    RBC 3.68 (*)    Hemoglobin 10.2 (*)    HCT 32.6 (*)    RDW 17.3 (*)    Platelets 559 (*)    All other components within normal limits  URINALYSIS, ROUTINE W REFLEX MICROSCOPIC - Abnormal; Notable for the following components:   APPearance HAZY (*)    Hgb urine dipstick LARGE (*)    Protein, ur 30 (*)    Leukocytes, UA LARGE (*)    WBC, UA >50 (*)    Bacteria, UA FEW (*)    All other components within normal limits  I-STAT CG4 LACTIC ACID, ED - Abnormal; Notable for the following components:   Lactic Acid, Venous 2.35 (*)    All other components within normal limits  URINE CULTURE  BASIC  METABOLIC PANEL  CBC  I-STAT CG4 LACTIC ACID, ED  TYPE AND SCREEN  ABO/RH    EKG None  Radiology Ct Abdomen Pelvis W Contrast  Result Date: 05/30/2018 CLINICAL DATA:  Abdominal pain. Clinical concern for diverticulitis. Undergoing chemotherapy for metastatic lung cancer. Recently completed radiation treatment. Current smoker. EXAM: CT ABDOMEN AND PELVIS WITH CONTRAST TECHNIQUE: Multidetector CT imaging of the abdomen and pelvis was performed using the standard protocol following bolus administration of intravenous contrast. CONTRAST:  117mL ISOVUE-300 IOPAMIDOL (ISOVUE-300) INJECTION 61% COMPARISON:  04/01/2018. Chest, abdomen and pelvis CT dated 04/28/2018. FINDINGS: Lower chest: Oval area of low density within the apical portion of the left ventricle. This measures 1.9 x 1.2 cm on image number 9 series 2 and 1.9 cm in length on sagittal image number 93. This was not previously present. Mild bibasilar atelectasis/scarring. Mild diffuse peribronchial thickening and bullous changes. Hepatobiliary: The previously demonstrated 3.8 x 2.7 cm mass in segment 8 of the liver on the right currently measures 1.6 x 1.3 cm in maximum diameter on image number 9 series 2. The previously demonstrated 3.4 x 2.8 cm oval mass in segment 4B/5, currently measures 2.1 cm in maximum diameter on image number 24 series 2. The previously demonstrated 1.5 x 1.5 cm mass in the central aspect of segment 4A is not visualized today. Also demonstrated is interval visualization of a 6 mm probable cyst in the posterior segment of the right lobe of the liver on image number 16 series 2. Unremarkable gallbladder. Pancreas: Diffuse pancreatic atrophy, most pronounced involving the head and neck. Spleen: Normal in size without focal abnormality. Adrenals/Urinary Tract: Normal appearing adrenal glands. Small right renal cysts. Small left renal vascular calcification. Mild diffuse wall thickening and enhancement of the right ureter and  right renal collecting system with mild dilatation. Normal appearing left ureter. Gas throughout the walls of the urinary bladder with nondependent air or gas in the bladder. Stomach/Bowel: Large amount of  stool throughout the colon. Mild sigmoid colon diverticulosis without evidence of diverticulitis. Unremarkable stomach and small bowel. No evidence of appendicitis. Vascular/Lymphatic: Atheromatous arterial calcifications without aneurysm. No enlarged lymph nodes. Reproductive: Uterus and bilateral adnexa are unremarkable. Other: Small left inguinal hernia containing fat. Musculoskeletal: Bilateral sacral ala fractures are again demonstrated with interval diffuse patchy bone sclerosis and nonunion. Also again demonstrated are right inferior pubic ramus and ischial/anterior acetabular fractures. Mild increase in associated callus formation with nonunion. Interval visualization of a mildly comminuted right superior pubic ramus fracture with patchy bone sclerosis. Moderate right hip degenerative changes. Lumbar and lower thoracic spine degenerative changes with progressive patchy sclerosis involving multiple lumbar and lower thoracic vertebral bodies. IMPRESSION: 1. Interval emphysematous cystitis. 2. Interval visualization of a 1.9 x 1.2 x 1.9 cm apical left ventricular clot. This places the patient at increased risk for embolic stroke or other vascular occlusions. 3. Interval mild right hydronephrosis and hydroureter with mild diffuse uroepithelial thickening and enhancement, compatible with infection. 4. Interval decrease in size of the previously demonstrated liver metastases. 5. Progressive sclerotic bony metastatic disease with nonunion of multiple previous fractures as described above as well as nonunion of an interval comminuted superior pubic ramus fracture on the right. 6. Mild changes of COPD and chronic bronchitis. 7. Large amount of stool throughout the colon. 8. Mild sigmoid diverticulosis without  evidence of diverticulitis. These results were called by telephone at the time of interpretation on 05/30/2018 at 8:11 pm to Dr. Lacretia Leigh , who verbally acknowledged these results. Electronically Signed   By: Claudie Revering M.D.   On: 05/30/2018 20:23    Procedures Procedures (including critical care time)  Medications Ordered in ED Medications  iopamidol (ISOVUE-300) 61 % injection (has no administration in time range)  sodium chloride (PF) 0.9 % injection (has no administration in time range)  enoxaparin (LOVENOX) injection 40 mg (has no administration in time range)  sodium chloride flush (NS) 0.9 % injection 3 mL (has no administration in time range)  dextrose 5 %-0.45 % sodium chloride infusion (has no administration in time range)  acetaminophen (TYLENOL) tablet 650 mg (has no administration in time range)    Or  acetaminophen (TYLENOL) suppository 650 mg (has no administration in time range)  ondansetron (ZOFRAN) tablet 4 mg (has no administration in time range)    Or  ondansetron (ZOFRAN) injection 4 mg (has no administration in time range)  dexamethasone (DECADRON) tablet 4 mg (has no administration in time range)  feeding supplement (ENSURE ENLIVE) (ENSURE ENLIVE) liquid 237 mL (has no administration in time range)  fentaNYL (DURAGESIC - dosed mcg/hr) patch 25 mcg (has no administration in time range)  levothyroxine (SYNTHROID, LEVOTHROID) tablet 75 mcg (has no administration in time range)  lidocaine (XYLOCAINE) 2 % viscous mouth solution 15 mL (has no administration in time range)  nystatin (MYCOSTATIN) 100000 UNIT/ML suspension 500,000 Units (has no administration in time range)  pantoprazole (PROTONIX) EC tablet 40 mg (has no administration in time range)  polyethylene glycol (MIRALAX / GLYCOLAX) packet 17 g (has no administration in time range)  prochlorperazine (COMPAZINE) tablet 10 mg (has no administration in time range)  senna (SENOKOT) tablet 8.6 mg (has no  administration in time range)  sodium chloride 0.9 % bolus 1,000 mL (0 mLs Intravenous Stopped 05/30/18 1836)  iopamidol (ISOVUE-300) 61 % injection 100 mL (100 mLs Intravenous Contrast Given 05/30/18 1926)     Initial Impression / Assessment and Plan / ED Course  I have reviewed  the triage vital signs and the nursing notes.  Pertinent labs & imaging results that were available during my care of the patient were reviewed by me and considered in my medical decision making (see chart for details).  Clinical Course as of May 30 2212  Thu May 30, 2018  2024 CT demonstrates left ventricular thrombus. Will consult cardiology. Also shows gas within the bladder significant for cystitis. In&out cath. Will discuss with GI as well as she has had BRB per rectum. Will need hospital admission.    [JS]    Clinical Course User Index [JS] Marty Heck, DO    77yo female with PMH diverticulosis, COPD, lung cancer with mets to liver and lower spine with previous radiation presenting with BRB on BM earlier today before receiving scheduled chemo at the [facility]. Hgb stable at presentation with soft bp. She was given 1L fluid and CT abdomen pelvis for diffuse tenderness. CT showed left ventricular thrombus and gas w/in the bladder concerning for acute infection but no acute GI bleed. LA is trending upward from 1.75 to 2.35. Discussed case with cardiology who recommended withholding anticoagulation currently due to possible GI bleed. GI consulted and will see patient in the am. Patient admitted to [facility].    Final Clinical Impressions(s) / ED Diagnoses   Final diagnoses:  Rectal bleeding  Left ventricular thrombus without MI  Left lower quadrant abdominal pain    ED Discharge Orders    None       Marty Heck, DO 05/30/18 2214    Lacretia Leigh, MD 05/31/18 2007

## 2018-05-30 NOTE — ED Notes (Signed)
I gave critical I Stat CG4 result to DO Seawell

## 2018-05-30 NOTE — ED Notes (Signed)
PUREWick applied.

## 2018-05-30 NOTE — Progress Notes (Signed)
[facility] Progress Note   [name] [name] [id number] 02/09/1941 77 y.o.  [name] [name] is managed by Dr. Julien Nordmann  Actively treated with chemotherapy/immunotherapy/hormonal therapy: yes  Current Therapy: carboplatin and etoposide with Udenyca support  Last Treated: 05/29/2018 (cycle 2, day 1) 05/30/2018 (cycle 2, day 2)  Assessment: Plan:    Small cell carcinoma of lower lobe of left lung (HCC)  Generalized abdominal pain  Lethargy   Extensive stage small cell lung cancer: The patient is status post cycle 2 days 1 and 2 of carboplatin and etoposide. Day 3 was held today. She was scheduled to receive peg-filgrastin on 06/01/2018. She will need Granix if kept as an in-patient.  Abdominal pain and lethargy: The patient was transported to the ER for evaluation and management.  Please see After Visit Summary for patient specific instructions.  Future Appointments  Date Time Provider [place]  06/01/2018 12:00 PM [facility] INFUSION [facility] None  06/05/2018 11:30 AM [facility] LAB ONLY [facility] None  06/11/2018 11:15 AM [facility] LAB 1 [facility] None  06/14/2018  3:00 PM [facility] 2 [facility] [place]  06/17/2018  1:45 PM [facility] LAB 3 [facility] None  06/17/2018  2:15 PM Curt Bears, MD [facility] None  06/18/2018  8:00 AM [facility] INFUSION [facility] None  06/20/2018  8:00 AM [facility] INFUSION [facility] None  06/21/2018  8:00 AM [facility] INFUSION [facility] None  07/02/2018  1:30 PM Bruning, Ashlyn, PA-C [facility] None  07/09/2018  9:30 AM [facility] LAB 1 [facility] None  07/09/2018 10:00 AM Curt Bears, MD [facility] None  07/09/2018 11:00 AM [facility] INFUSION [facility] None  07/10/2018  8:30 AM [facility] INFUSION [facility] None  07/11/2018  8:30 AM [facility] INFUSION [facility] None    No orders of the defined types were placed in this encounter.      Subjective:   Patient ID:  [name] [name] is a 77 y.o. (DOB 1941/05/19) female.  Chief Complaint: No chief complaint on file.   HPI [name] [name] is a 77 year old female with an extensive stage (T2 a, N3, M1c) small cell lung cancer diagnosed in November 2019 and presented with left lower lobe lung mass in addition to bilateral hilar and mediastinal lymphadenopathy as well as metastatic disease to bones and liver. She was treated palliative radiotherapy to the metastatic bone disease at T6 with cord compression under the care of Dr. Tammi Klippel which was completed on 05/15/2018. She was begun on systemic chemotherapy with carboplatin and etoposide with Neulasta support as well as Tecentriq Acupuncturist) with her first dosed on May 07, 2018 at [facility]. The patient is status post cycle 2 days 1 and 2 of carboplatin and etoposide. Day 3 was held today. She was scheduled to receive peg-filgrastin on 06/01/2018. She presented with her son today. He reports that she is more lethargic today. She is holding her stomach and is able to report that she is having abdominal pain. She was up to the bathroom with assistance of 2 nurses when it was noted that she had bright red mucoid blood from her rectum. She was evaluated and was not found to have hemorrhoids.   Medications: I have reviewed the patient's current medications.  Allergies:  Allergies  Allergen Reactions  . Atorvastatin     Intolerant to all statins   . Cyclobenzaprine Other (See Comments)    Bradycardia, hypotension  . Ezetimibe      INTOL to Zetia  . Rosuvastatin     intolerant to all statins  .  Gadolinium Derivatives Nausea Only    Pt became very nauseous after gadolinium administration//lh  . Nicoderm [Nicotine] Rash    Unable to use the patches---caused severe rash to area placed    Past Medical History:  Diagnosis Date  . Asymptomatic varicose veins   . Cataract    beginnings  . Cigarette smoker   . COPD (chronic obstructive pulmonary disease)  ([place])   . Diverticulosis of colon (without mention of hemorrhage)   . Headache(784.0)   . Hearing loss    wears hearing aides  . Lumbar back pain   . Pure hypercholesterolemia   . Stress disorder, acute   . Varicose veins     Past Surgical History:  Procedure Laterality Date  . COLONOSCOPY  04-22-2004  . LUMBAR SPINE SURGERY     [phone]  . varicose vein treatment     at [facility]    Family History  Problem Relation Age of Onset  . Diabetes Father   . Heart attack Mother 63       nonsmoker  . Colon cancer Neg Hx   . Rectal cancer Neg Hx   . Stomach cancer Neg Hx     Social History   Socioeconomic History  . Marital status: Married    Spouse name: Not on file  . Number of children: 2  . Years of education: Not on file  . Highest education level: Not on file  Occupational History  . Occupation: retired    Fish farm manager: RETIRED  Social Needs  . Financial resource strain: Not on file  . Food insecurity:    Worry: Not on file    Inability: Not on file  . Transportation needs:    Medical: Not on file    Non-medical: Not on file  Tobacco Use  . Smoking status: Current Every Day Smoker    Packs/day: 0.50    Years: 40.00    Pack years: 20.00    Types: Cigarettes  . Smokeless tobacco: Never Used  . Tobacco comment: 1/2-3/4 of a pk a day   Substance and Sexual Activity  . Alcohol use: No    Alcohol/week: 0.0 standard drinks  . Drug use: No  . Sexual activity: Not on file  Lifestyle  . Physical activity:    Days per week: Not on file    Minutes per session: Not on file  . Stress: Not on file  Relationships  . Social connections:    Talks on phone: Not on file    Gets together: Not on file    Attends religious service: Not on file    Active member of club or organization: Not on file    Attends meetings of clubs or organizations: Not on file    Relationship status: Not on file  . Intimate partner violence:    Fear of current or ex partner: Not on  file    Emotionally abused: Not on file    Physically abused: Not on file    Forced sexual activity: Not on file  Other Topics Concern  . Not on file  Social History Narrative   Married 2008 (3rd marriage). 2 sons by first husband. 3 grandkids.       Retired [employer] for 40 years      Hobbies: Designer, multimedia games, go to casino ([place])    Past Medical History, Surgical history, Social history, and Family history were reviewed and updated as appropriate.   Please see review of systems for  further details on the patient's review from today.   Review of Systems:  Review of Systems  Constitutional: Negative for activity change, appetite change, chills, diaphoresis and fever.  Respiratory: Negative for cough, chest tightness and shortness of breath.   Cardiovascular: Negative for chest pain, palpitations and leg swelling.  Gastrointestinal: Positive for abdominal pain and anal bleeding. Negative for abdominal distention, constipation, diarrhea, nausea and vomiting.  Psychiatric/Behavioral: Positive for decreased concentration.    Objective:   Physical Exam:  There were no vitals taken for this visit. ECOG: 2  Physical Exam Constitutional:      General: She is not in acute distress.    Appearance: She is not diaphoretic.  HENT:     Head: Normocephalic and atraumatic.     Mouth/Throat:     Pharynx: No oropharyngeal exudate.  Neck:     Musculoskeletal: Normal range of motion and neck supple.  Cardiovascular:     Rate and Rhythm: Normal rate and regular rhythm.     Heart sounds: Normal heart sounds. No murmur. No friction rub. No gallop.   Pulmonary:     Effort: Pulmonary effort is normal. No respiratory distress.     Breath sounds: Normal breath sounds. No wheezing or rales.  Abdominal:     General: Bowel sounds are normal. There is no distension.     Palpations: Abdomen is soft. There is no mass.     Tenderness: There is abdominal tenderness (bilateral  lower abdominal pain, greater on the left than right). There is no guarding or rebound.  Lymphadenopathy:     Cervical: No cervical adenopathy.  Skin:    General: Skin is warm and dry.     Findings: No erythema or rash.     Lab Review:     Component Value Date/Time   NA 136 05/28/2018 1016   NA 128 (A) 04/19/2018   K 4.3 05/28/2018 1016   CL 101 05/28/2018 1016   CO2 23 05/28/2018 1016   GLUCOSE 152 (H) 05/28/2018 1016   BUN 20 05/28/2018 1016   BUN 11 04/19/2018   CREATININE 0.70 05/28/2018 1016   CREATININE 0.71 03/22/2018 1521   CALCIUM 8.5 (L) 05/28/2018 1016   PROT 5.8 (L) 05/28/2018 1016   ALBUMIN 2.3 (L) 05/28/2018 1016   AST 9 (L) 05/28/2018 1016   ALT 11 05/28/2018 1016   ALKPHOS 181 (H) 05/28/2018 1016   BILITOT 0.3 05/28/2018 1016   GFRNONAA >60 05/28/2018 1016   GFRAA >60 05/28/2018 1016       Component Value Date/Time   WBC 22.2 (H) 05/28/2018 1016   WBC 16.1 (H) 05/10/2018 0629   RBC 3.62 (L) 05/28/2018 1016   HGB 9.9 (L) 05/28/2018 1016   HCT 31.3 (L) 05/28/2018 1016   PLT 479 (H) 05/28/2018 1016   MCV 86.5 05/28/2018 1016   MCH 27.3 05/28/2018 1016   MCHC 31.6 05/28/2018 1016   RDW 17.3 (H) 05/28/2018 1016   LYMPHSABS 1.0 05/28/2018 1016   MONOABS 1.0 05/28/2018 1016   EOSABS 0.0 05/28/2018 1016   BASOSABS 0.0 05/28/2018 1016   -------------------------------  Imaging from last 24 hours (if applicable):  Radiology interpretation: Mr Jeri Cos Wo Contrast  Result Date: 05/02/2018 CLINICAL DATA:  Initial evaluation for small cell lung cancer, staging. EXAM: MRI HEAD WITHOUT AND WITH CONTRAST TECHNIQUE: Multiplanar, multiecho pulse sequences of the brain and surrounding structures were obtained without and with intravenous contrast. CONTRAST:  60 cc of Gadavist. COMPARISON:  Prior CT from 07/07/2016 FINDINGS:  Brain: Moderately advanced cerebral atrophy with chronic small vessel ischemic disease. Superimposed remote left thalamic lacunar infarct  noted. No abnormal foci of restricted diffusion to suggest acute or subacute ischemia. Gray-white matter differentiation maintained. No encephalomalacia to suggest chronic cortical infarction. No foci of susceptibility artifact to suggest acute or chronic intracranial hemorrhage. No mass lesion, midline shift or mass effect. Ventricles normal size without hydrocephalus. No extra-axial fluid collection. No abnormal enhancement. Apparent punctate focus of enhancement within the subcortical white matter of the left frontal lobe seen on coronal post gadolinium sequence image 15 favored to be artifactual or possibly vascular in nature. No other evidence for intracranial metastatic disease. Pituitary gland grossly within normal limits. Vascular: Major intracranial vascular flow voids maintained. Skull and upper cervical spine: Craniocervical junction normal. Multilevel cervical spondylolysis noted within the upper cervical spine without high-grade stenosis. There is abnormal T1 hypointense signal intensity seen involving the right lateral mass of C1 (series 4, image 8), indeterminate, but concerning for possible osseous metastasis. No other focal marrow replacing lesion identified. Sinuses/Orbits: Globes and orbital soft tissues within normal limits. Right maxillary sinus retention cyst. Paranasal sinuses are otherwise clear. No significant mastoid effusion. Inner ear structures grossly normal. Other: None. IMPRESSION: 1. Abnormal signal intensity involving the right lateral mass of C1, concerning for possible osseous metastasis. 2. No other MRI evidence for intracranial metastatic disease within the brain. 3. No other acute intracranial abnormality. 4. Moderately advanced cerebral atrophy with chronic small vessel ischemic disease. Electronically Signed   By: Jeannine Boga M.D.   On: 05/02/2018 21:39        This case was discussed with Dr. Julien Nordmann. He expressed agreement with my management of this  patient.

## 2018-05-30 NOTE — H&P (Signed)
History and Physical    [name] [name]:[id number] DOB: 02-02-1941 DOA: 05/30/2018  PCP: Marin Olp, MD  Patient coming from: Oncology office  I have personally briefly reviewed patient's old medical records in [place]  Chief Complaint: BRBPR, lethargy  HPI: [name] [name] is a 77 y.o. female with medical history significant for Small Cell Lung Cancer with metastasis to the liver and spine with cord compression on systemic chemotherapy and palliative radiotherapy, hypothyroidism, and diverticulosis who presented to the [facility] long ED from her oncologist office due to abdominal pain, BRBPR seen in the office, and increased lethargy.  Patient's son and husband are at bedside and provide additional history.  Patient states that she has had inability to walk since starting palliative radiotherapy for her T6 metastatic bone disease.  She recently developed abdominal pain but reports regular daily bowel movements normal in appearance until today when she noted bright red blood mixed into her stool.  She reports good urine output without dysuria or hematuria.  She had a prior ESBL E. coli UTI in October 2019.  She also reports cough with new green sputum production earlier today.  She has had night sweats but denies fevers.  She denies chest pain, lightheadedness, or dizziness.  She has had nausea, indigestion, with acid reflux but reports good oral intake recently.  ED Course:  Initial vitals in the ED showed BP 80/61, pulse 67, RR 20, temp 98.18F, SPO2 95% on room air.  Labs were notable for WBC 25.5 (22.2 two days ago, up from 0.5 after neulasta treatment 05/14/18), hemoglobin 10.2 near her recent baseline, platelets 559, BUN 30, creatinine 0.93.  Urinalysis showed large leukocytes, negative nitrites, few bacteria, 6-10 RBCs on microscopy.  CT abdomen pelvis with contrast was obtained which was read as follows: 1. Interval emphysematous cystitis. 2. Interval visualization of a  1.9 x 1.2 x 1.9 cm apical left ventricular clot. This places the patient at increased risk for embolic stroke or other vascular occlusions. 3. Interval mild right hydronephrosis and hydroureter with mild diffuse uroepithelial thickening and enhancement, compatible with infection. 4. Interval decrease in size of the previously demonstrated liver metastases. 5. Progressive sclerotic bony metastatic disease with nonunion of multiple previous fractures as described above as well as nonunion of an interval comminuted superior pubic ramus fracture on the right. 6. Mild changes of COPD and chronic bronchitis. 7. Large amount of stool throughout the colon. 8. Mild sigmoid diverticulosis without evidence of diverticulitis.  She was given 1 L normal saline bolus with improvement in blood pressure.  The ED provider discussed the case with GI who will consult on patient in the morning.  Cardiology was also consulted in regard to the left ventricular apical clot seen on CT recommended holding off on anticoagulation with potential GI bleeding.  The hospitalist service was consulted to admit for further management.  Review of Systems: As per HPI otherwise 10 point review of systems negative.    Past Medical History:  Diagnosis Date  . Asymptomatic varicose veins   . Cataract    beginnings  . Cigarette smoker   . COPD (chronic obstructive pulmonary disease) ([place])   . Diverticulosis of colon (without mention of hemorrhage)   . Headache(784.0)   . Hearing loss    wears hearing aides  . Lumbar back pain   . Pure hypercholesterolemia   . Stress disorder, acute   . Varicose veins     Past Surgical History:  Procedure Laterality Date  .  COLONOSCOPY  04-22-2004  . LUMBAR SPINE SURGERY     [phone]  . varicose vein treatment     at [facility]     reports that she has been smoking cigarettes. She has a 20.00 pack-year smoking history. She has never used smokeless tobacco. She reports  that she does not drink alcohol or use drugs.  Allergies  Allergen Reactions  . Atorvastatin     Intolerant to all statins   . Cyclobenzaprine Other (See Comments)    Bradycardia, hypotension  . Ezetimibe      INTOL to Zetia  . Rosuvastatin     intolerant to all statins  . Gadolinium Derivatives Nausea Only    Pt became very nauseous after gadolinium administration//lh  . Nicoderm [Nicotine] Rash    Unable to use the patches---caused severe rash to area placed    Family History  Problem Relation Age of Onset  . Diabetes Father   . Heart attack Mother 66       nonsmoker  . Colon cancer Neg Hx   . Rectal cancer Neg Hx   . Stomach cancer Neg Hx      Prior to Admission medications   Medication Sig Start Date End Date Taking? Authorizing Provider  ALPRAZolam Duanne Moron) 0.5 MG tablet Take 1 tablet (0.5 mg total) by mouth 2 (two) times daily as needed for anxiety or sleep. 05/11/18  Yes Shelly Coss, MD  dexamethasone (DECADRON) 4 MG tablet Take 1 tablet (4 mg total) by mouth daily. 05/28/18  Yes Alla Feeling, NP  feeding supplement, ENSURE ENLIVE, (ENSURE ENLIVE) LIQD Take 237 mLs by mouth 2 (two) times daily between meals. 05/11/18  Yes Shelly Coss, MD  fentaNYL (DURAGESIC - DOSED MCG/HR) 25 MCG/HR patch Place 1 patch (25 mcg total) onto the skin every 3 (three) days. 05/12/18  Yes Shelly Coss, MD  levothyroxine (SYNTHROID, LEVOTHROID) 75 MCG tablet TAKE 1 TABLET(75 MCG) BY MOUTH DAILY Patient taking differently: Take 75 mcg by mouth daily before breakfast.  04/03/18   Marin Olp, MD  lidocaine (XYLOCAINE) 2 % solution Use as directed 15 mLs in the mouth or throat every 3 (three) hours as needed for mouth pain (mouth ulcers). 05/11/18   Shelly Coss, MD  nicotine polacrilex (NICORETTE) 2 MG gum Take 1 each (2 mg total) by mouth as needed for smoking cessation. 03/26/18   Patrecia Pour, MD  nystatin (MYCOSTATIN) 100000 UNIT/ML suspension Take 5 mLs (500,000 Units  total) by mouth 4 (four) times daily. 05/11/18   Shelly Coss, MD  omeprazole (PRILOSEC) 40 MG capsule Take 1 capsule (40 mg total) by mouth daily. 05/28/18   Alla Feeling, NP  ondansetron (ZOFRAN-ODT) 4 MG disintegrating tablet Take 1 tablet (4 mg total) by mouth every 8 (eight) hours as needed for nausea or vomiting. 03/04/18   Marin Olp, MD  polyethylene glycol (MIRALAX / Floria Raveling) packet Take 17 g by mouth daily. 05/11/18   Shelly Coss, MD  prochlorperazine (COMPAZINE) 10 MG tablet Take 1 tablet (10 mg total) by mouth every 6 (six) hours as needed for nausea or vomiting. 05/28/18   Alla Feeling, NP  senna (SENOKOT) 8.6 MG TABS tablet Take 1 tablet (8.6 mg total) by mouth 2 (two) times daily. 03/26/18   Patrecia Pour, MD    Physical Exam: Vitals:   05/30/18 2100 05/30/18 2111 05/30/18 2130 05/30/18 2200  BP: 128/76 128/76 (!) 106/57 (!) 107/59  Pulse: 87 71 68 65  Resp:  17 (!) 21 (!) 22 17  Temp:      SpO2: 95% 95% 93% 93%    Constitutional: NAD, calm, comfortable Eyes: PERRL, EOMI, lids and conjunctivae normal ENMT: Mucous membranes are moist. Posterior pharynx clear of any exudate or lesions.Normal dentition.  Neck: normal, supple. Respiratory: expiratory wheezing at the bases, no crackles. Normal respiratory effort. No accessory muscle use.  Cardiovascular: Regular rate and rhythm, no murmurs / rubs / gallops. No extremity edema.  PICC line right upper extremity. Abdomen: lower abdominal tenderness, no masses palpated. No hepatosplenomegaly. Bowel sounds positive.  Musculoskeletal: no clubbing / cyanosis. No joint deformity upper and lower extremities. Good ROM, no contractures. Normal muscle tone.  Skin: no rashes, lesions, ulcers. No induration Neurologic: CN 2-12 grossly intact. Sensation intact. Strength 5/5 in all 4, gait not assessed.  Psychiatric: Normal judgment and insight. Alert and oriented x 3. Normal mood.   Labs on Admission: I have personally  reviewed following labs and imaging studies  CBC: Recent Labs  Lab 05/28/18 1016 05/30/18 1724  WBC 22.2* 25.5*  NEUTROABS 19.3*  --   HGB 9.9* 10.2*  HCT 31.3* 32.6*  MCV 86.5 88.6  PLT 479* 400*   Basic Metabolic Panel: Recent Labs  Lab 05/28/18 1016 05/30/18 1724  NA 136 133*  K 4.3 4.0  CL 101 100  CO2 23 22  GLUCOSE 152* 96  BUN 20 30*  CREATININE 0.70 0.93  CALCIUM 8.5* 8.2*   GFR: Estimated Creatinine Clearance: 47.7 mL/min (by C-G formula based on SCr of 0.93 mg/dL). Liver Function Tests: Recent Labs  Lab 05/28/18 1016  AST 9*  ALT 11  ALKPHOS 181*  BILITOT 0.3  PROT 5.8*  ALBUMIN 2.3*   No results for input(s): LIPASE, AMYLASE in the last 168 hours. No results for input(s): AMMONIA in the last 168 hours. Coagulation Profile: No results for input(s): INR, PROTIME in the last 168 hours. Cardiac Enzymes: No results for input(s): CKTOTAL, CKMB, CKMBINDEX, TROPONINI in the last 168 hours. BNP (last 3 results) No results for input(s): PROBNP in the last 8760 hours. HbA1C: No results for input(s): HGBA1C in the last 72 hours. CBG: No results for input(s): GLUCAP in the last 168 hours. Lipid Profile: No results for input(s): CHOL, HDL, LDLCALC, TRIG, CHOLHDL, LDLDIRECT in the last 72 hours. Thyroid Function Tests: Recent Labs    05/28/18 1015  TSH 0.808   Anemia Panel: No results for input(s): VITAMINB12, FOLATE, FERRITIN, TIBC, IRON, RETICCTPCT in the last 72 hours. Urine analysis:    Component Value Date/Time   COLORURINE YELLOW 05/30/2018 1729   APPEARANCEUR HAZY (A) 05/30/2018 1729   LABSPEC 1.023 05/30/2018 1729   PHURINE 6.0 05/30/2018 1729   GLUCOSEU NEGATIVE 05/30/2018 1729   GLUCOSEU NEGATIVE 01/28/2018 1055   HGBUR LARGE (A) 05/30/2018 1729   BILIRUBINUR NEGATIVE 05/30/2018 1729   BILIRUBINUR Negative 04/25/2018 1432   KETONESUR NEGATIVE 05/30/2018 1729   PROTEINUR 30 (A) 05/30/2018 1729   UROBILINOGEN 0.2 04/25/2018 1432    UROBILINOGEN 0.2 01/28/2018 1055   NITRITE NEGATIVE 05/30/2018 1729   LEUKOCYTESUR LARGE (A) 05/30/2018 1729    Radiological Exams on Admission: X-ray Chest Pa And Lateral  Result Date: 05/30/2018 CLINICAL DATA:  77 y/o F; history of lung cancer with metastasis. Completed radiation treatment. Currently receiving chemotherapy. Productive cough. EXAM: CHEST - 2 VIEW COMPARISON:  04/27/2018 chest radiograph. 04/28/2018 CT chest. FINDINGS: Stable normal cardiac silhouette given projection and technique. Right PICC line tip projects over lower SVC. Reticular  and patchy opacities greatest in the left lung base. Mass in the left mid lung zone is less evident when compared with prior chest radiograph. No acute osseous abnormality is evident. IMPRESSION: 1. Reticular and patchy opacities greatest in the left lung base probably represents interstitial pulmonary edema. Pneumonia is possible. 2. Left mid lung zone mass is less evident when compared with prior chest radiograph. Electronically Signed   By: Kristine Garbe M.D.   On: 05/30/2018 22:41   Ct Abdomen Pelvis W Contrast  Result Date: 05/30/2018 CLINICAL DATA:  Abdominal pain. Clinical concern for diverticulitis. Undergoing chemotherapy for metastatic lung cancer. Recently completed radiation treatment. Current smoker. EXAM: CT ABDOMEN AND PELVIS WITH CONTRAST TECHNIQUE: Multidetector CT imaging of the abdomen and pelvis was performed using the standard protocol following bolus administration of intravenous contrast. CONTRAST:  161mL ISOVUE-300 IOPAMIDOL (ISOVUE-300) INJECTION 61% COMPARISON:  04/01/2018. Chest, abdomen and pelvis CT dated 04/28/2018. FINDINGS: Lower chest: Oval area of low density within the apical portion of the left ventricle. This measures 1.9 x 1.2 cm on image number 9 series 2 and 1.9 cm in length on sagittal image number 93. This was not previously present. Mild bibasilar atelectasis/scarring. Mild diffuse peribronchial  thickening and bullous changes. Hepatobiliary: The previously demonstrated 3.8 x 2.7 cm mass in segment 8 of the liver on the right currently measures 1.6 x 1.3 cm in maximum diameter on image number 9 series 2. The previously demonstrated 3.4 x 2.8 cm oval mass in segment 4B/5, currently measures 2.1 cm in maximum diameter on image number 24 series 2. The previously demonstrated 1.5 x 1.5 cm mass in the central aspect of segment 4A is not visualized today. Also demonstrated is interval visualization of a 6 mm probable cyst in the posterior segment of the right lobe of the liver on image number 16 series 2. Unremarkable gallbladder. Pancreas: Diffuse pancreatic atrophy, most pronounced involving the head and neck. Spleen: Normal in size without focal abnormality. Adrenals/Urinary Tract: Normal appearing adrenal glands. Small right renal cysts. Small left renal vascular calcification. Mild diffuse wall thickening and enhancement of the right ureter and right renal collecting system with mild dilatation. Normal appearing left ureter. Gas throughout the walls of the urinary bladder with nondependent air or gas in the bladder. Stomach/Bowel: Large amount of stool throughout the colon. Mild sigmoid colon diverticulosis without evidence of diverticulitis. Unremarkable stomach and small bowel. No evidence of appendicitis. Vascular/Lymphatic: Atheromatous arterial calcifications without aneurysm. No enlarged lymph nodes. Reproductive: Uterus and bilateral adnexa are unremarkable. Other: Small left inguinal hernia containing fat. Musculoskeletal: Bilateral sacral ala fractures are again demonstrated with interval diffuse patchy bone sclerosis and nonunion. Also again demonstrated are right inferior pubic ramus and ischial/anterior acetabular fractures. Mild increase in associated callus formation with nonunion. Interval visualization of a mildly comminuted right superior pubic ramus fracture with patchy bone sclerosis.  Moderate right hip degenerative changes. Lumbar and lower thoracic spine degenerative changes with progressive patchy sclerosis involving multiple lumbar and lower thoracic vertebral bodies. IMPRESSION: 1. Interval emphysematous cystitis. 2. Interval visualization of a 1.9 x 1.2 x 1.9 cm apical left ventricular clot. This places the patient at increased risk for embolic stroke or other vascular occlusions. 3. Interval mild right hydronephrosis and hydroureter with mild diffuse uroepithelial thickening and enhancement, compatible with infection. 4. Interval decrease in size of the previously demonstrated liver metastases. 5. Progressive sclerotic bony metastatic disease with nonunion of multiple previous fractures as described above as well as nonunion of an interval comminuted  superior pubic ramus fracture on the right. 6. Mild changes of COPD and chronic bronchitis. 7. Large amount of stool throughout the colon. 8. Mild sigmoid diverticulosis without evidence of diverticulitis. These results were called by telephone at the time of interpretation on 05/30/2018 at 8:11 pm to Dr. Lacretia Leigh , who verbally acknowledged these results. Electronically Signed   By: Claudie Revering M.D.   On: 05/30/2018 20:23    EKG: Independently reviewed. NSR, rate 60 bpm, qwaves anterior leads  Assessment/Plan Principal Problem:   LV (left ventricular) mural thrombus without MI Active Problems:   Hypothyroidism   Spinal cord compression due to malignant neoplasm metastatic to spine (HCC)   Small cell carcinoma of lower lobe of left lung (HCC)   BRBPR (bright red blood per rectum)   RUCHEL BRANDENBURGER is a 77 y.o. female with medical history significant for Small Cell Lung Cancer with metastasis to the liver and spine with cord compression on systemic chemotherapy and palliative radiotherapy, hypothyroidism, and diverticulosis who presented to the [facility] long ED from her oncologist office due to abdominal pain, BRBPR seen in  the office, and increased lethargy.  CT imaging was notable for an apical left ventricular clot, emphysematous cystitis, mild right hydronephrosis and hydroureter, progressive sclerotic bony metastatic disease, mild sigmoid diverticulosis without diverticulitis, and large amount of stool throughout the colon.  Apical left ventricular clot: Seen on CT abdomen pelvis.  Previous echocardiogram on 04/28/2018 showed hypokinesis of the mid apical anteroseptal and apicalinferior myocardium without report of thrombus. -Obtain echocardiogram, if negative will likely need TEE -Hold anticoagulation for now, but is at increased risk for embolic CVA -Neurochecks -Cardiology consult in a.m.  BRBPR: Suspect secondary to diverticulosis.  Hemoglobin is stable without reported significant further bleeding.  Will continue to monitor and if maintained stability may need to begin anticoagulation for the above. -Type and screened -Repeat hemoglobin in a.m. -GI consulted, to see in a.m.  Small cell lung cancer with metastasis to the liver and bone associated with spinal cord compression: Follows with oncology, Dr. Julien Nordmann, for chemotherapy (carboplatin and etoposide) and with Radiation Oncology, Dr. Tammi Klippel, for palliative radiotherapy for T6 cord compression (now completed).  She reports cough with green sputum production earlier today which is now clear.  Chest x-ray is obtained and without obvious ammonia. -Continue oral dexamethasone 4 mg daily -Scheduled for PEG filgrastim on 06/01/2018, will need Granix if kept as inpatient per oncology note -PT/OT for lower extremity weakness  Right-sided hydronephrosis/ureter/emphysematous cystitis: Recent ESBL E. coli UTI treated with meropenem.  Urinalysis is borderline and patient denies any urinary symptoms. -Urine culture  Large amount of colonic stool: Seen on CT. Has been tapered off MS Contin, on fentanyl patch. -Schedule Senokot and MiraLAX -Consider  enema  Hypotension: Hypotensive on admission, improved with initial fluids. -Continue maintenance IV fluids overnight, bolus if needed  Hypothyroidism: TSH 0.808 on 05/28/2018. -Continue home Synthroid  DVT prophylaxis: SCDs Code Status: Full code Family Communication: Husband and son at bedside Disposition Plan: Pending further work-up and management of LV thrombus and BRBPR Consults called: GI to see in a.m. Admission status: Inpatient   Zada Finders MD [facility] Pager [phone]  If 7PM-7AM, please contact night-coverage [email or web address] Password [email or web address]  05/30/2018, 11:10 PM

## 2018-05-30 NOTE — ED Notes (Signed)
Bed: RV20 Expected date:  Expected time:  Means of arrival:  Comments: Bloody stool- from [facility]- [facility] (last name)

## 2018-05-31 ENCOUNTER — Inpatient Hospital Stay (HOSPITAL_COMMUNITY): Payer: Medicare Other

## 2018-05-31 ENCOUNTER — Other Ambulatory Visit: Payer: Self-pay

## 2018-05-31 ENCOUNTER — Inpatient Hospital Stay: Payer: Medicare Other

## 2018-05-31 DIAGNOSIS — I340 Nonrheumatic mitral (valve) insufficiency: Secondary | ICD-10-CM

## 2018-05-31 DIAGNOSIS — K5909 Other constipation: Secondary | ICD-10-CM

## 2018-05-31 DIAGNOSIS — R1030 Lower abdominal pain, unspecified: Secondary | ICD-10-CM

## 2018-05-31 DIAGNOSIS — K625 Hemorrhage of anus and rectum: Secondary | ICD-10-CM

## 2018-05-31 LAB — BASIC METABOLIC PANEL
Anion gap: 9 (ref 5–15)
BUN: 22 mg/dL (ref 8–23)
CO2: 24 mmol/L (ref 22–32)
Calcium: 7.5 mg/dL — ABNORMAL LOW (ref 8.9–10.3)
Chloride: 102 mmol/L (ref 98–111)
Creatinine, Ser: 0.57 mg/dL (ref 0.44–1.00)
GFR calc Af Amer: >60 mL/min (ref >60)
GFR calc non Af Amer: >60 mL/min (ref >60)
Glucose, Bld: 100 mg/dL — ABNORMAL HIGH (ref 70–99)
Potassium: 4.1 mmol/L (ref 3.5–5.1)
Sodium: 135 mmol/L (ref 135–145)

## 2018-05-31 LAB — CBC
HCT: 26.1 % — ABNORMAL LOW (ref 36.0–46.0)
Hemoglobin: 8.2 g/dL — ABNORMAL LOW (ref 12.0–15.0)
MCH: 27.8 pg (ref 26.0–34.0)
MCHC: 31.4 g/dL (ref 30.0–36.0)
MCV: 88.5 fL (ref 80.0–100.0)
Platelets: 461 10*3/uL — ABNORMAL HIGH (ref 150–400)
RBC: 2.95 MIL/uL — ABNORMAL LOW (ref 3.87–5.11)
RDW: 17.2 % — AB (ref 11.5–15.5)
WBC: 15.1 10*3/uL — ABNORMAL HIGH (ref 4.0–10.5)
nRBC: 0 % (ref 0.0–0.2)

## 2018-05-31 LAB — ECHOCARDIOGRAM COMPLETE
Height: 65 in
Weight: 2336 oz

## 2018-05-31 LAB — ABO/RH: ABO/RH(D): A POS

## 2018-05-31 MED ORDER — ALUM & MAG HYDROXIDE-SIMETH 200-200-20 MG/5ML PO SUSP
30.0000 mL | Freq: Four times a day (QID) | ORAL | Status: DC | PRN
  Administered 2018-05-31 – 2018-06-21 (×22): 30 mL via ORAL
  Filled 2018-05-31 (×23): qty 30

## 2018-05-31 MED ORDER — PERFLUTREN LIPID MICROSPHERE
1.0000 mL | INTRAVENOUS | Status: AC | PRN
  Administered 2018-05-31: 3 mL via INTRAVENOUS
  Filled 2018-05-31: qty 10

## 2018-05-31 MED ORDER — POLYETHYLENE GLYCOL 3350 17 G PO PACK
17.0000 g | PACK | Freq: Two times a day (BID) | ORAL | Status: DC
  Administered 2018-05-31 – 2018-06-19 (×26): 17 g via ORAL
  Filled 2018-05-31 (×33): qty 1

## 2018-05-31 MED ORDER — SODIUM CHLORIDE 0.9 % IV SOLN
1.0000 g | Freq: Three times a day (TID) | INTRAVENOUS | Status: DC
  Administered 2018-05-31 – 2018-06-07 (×21): 1 g via INTRAVENOUS
  Filled 2018-05-31 (×23): qty 1

## 2018-05-31 MED ORDER — SODIUM CHLORIDE 0.9% FLUSH
10.0000 mL | INTRAVENOUS | Status: DC | PRN
  Administered 2018-06-05 – 2018-06-21 (×3): 10 mL
  Filled 2018-05-31 (×3): qty 40

## 2018-05-31 NOTE — Evaluation (Signed)
Physical Therapy Evaluation Patient Details Name: [name] [name] MRN: [id number] DOB: 1941-05-15 Today's Date: 05/31/2018   History of Present Illness  [name] [name] is a 77 y.o. female with a hx significant for small cell lung CA w/ metastases to the liver and spine with cord compression on chemo and palliative radiation, HLD, tobacco abuse, hypothyroidism and diverticulosis, who presented from oncologist office due to abdominal pain, increased lethargy and BRBPR.   Patient with an incidental finding of a 1.9 x 1.2 x 1.9 cm apical left.  Clinical Impression  Patient presents with decreased independence with mobility due to LE weakness, incoordination and decreased activity tolerance.  She will benefit from skilled PT in the acute setting to allow return home following SNF level rehab stay.     Follow Up Recommendations Supervision/Assistance - 24 hour;SNF    Equipment Recommendations  None recommended by PT    Recommendations for Other Services       Precautions / Restrictions Precautions Precautions: Fall Precaution Comments: pubic ramus fracture in October, mets to spine       Mobility  Bed Mobility Overal bed mobility: Needs Assistance Bed Mobility: Rolling;Sidelying to Sit Rolling: Supervision Sidelying to sit: Min assist   Sit to supine: Min assist   General bed mobility comments: cues for technique, assist for trunk to sit, for legs to supine  Transfers Overall transfer level: Needs assistance Equipment used: Rolling walker (2 wheeled) Transfers: Sit to/from Stand Sit to Stand: Mod assist Stand pivot transfers: Min assist       General transfer comment: lifting help from EOB and increased time for UE transition to walker, assist for stand step to [facility] then back to bed with feet crossing, incoordination with stepping assist for safety  Ambulation/Gait                Stairs            Wheelchair Mobility    Modified Rankin (Stroke Patients  Only)       Balance Overall balance assessment: Needs assistance;History of Falls Sitting-balance support: Feet supported Sitting balance-Leahy Scale: Fair     Standing balance support: Bilateral upper extremity supported Standing balance-Leahy Scale: Poor Standing balance comment: relies heavily on RW and PT steadying                              Pertinent Vitals/Pain Faces Pain Scale: Hurts little more Pain Location: with trying to have BM Pain Descriptors / Indicators: Discomfort Pain Intervention(s): Monitored during session    Home Living Family/patient expects to be discharged to:: Skilled nursing facility Living Arrangements: Other (Comment)(Was at [employer])                    Prior Function Level of Independence: Needs assistance   Gait / Transfers Assistance Needed: had walked up to 40' in nursing home per spouse           Hand Dominance   Dominant Hand: Right    Extremity/Trunk Assessment        Lower Extremity Assessment Lower Extremity Assessment: RLE deficits/detail;LLE deficits/detail RLE Deficits / Details: AROM WFL, strength grossly 4 to 4-/5 but incoordination present in function RLE Coordination: decreased gross motor LLE Deficits / Details: AROM WFL, strength grossly 4 to 4-/5 but incoordination present in function LLE Coordination: decreased gross motor    Cervical / Trunk Assessment Cervical / Trunk Assessment: Kyphotic  Communication  Communication: HOH  Cognition Arousal/Alertness: Awake/alert Behavior During Therapy: WFL for tasks assessed/performed Overall Cognitive Status: Within Functional Limits for tasks assessed                                        General Comments General comments (skin integrity, edema, etc.): initially soiled in bed so assist with pericare in supine/sidelying, assist with transfer and pt incontinent of urine, assist with getting BSC and transfer, then with peri  care after.  Pt too fatigued to ambulate    Exercises     Assessment/Plan    PT Assessment Patient needs continued PT services  PT Problem List Decreased strength;Decreased balance;Impaired sensation;Decreased activity tolerance;Decreased mobility;Decreased knowledge of use of DME       PT Treatment Interventions Functional mobility training;DME instruction;Gait training;Therapeutic exercise;Therapeutic activities;Balance training;Patient/family education    PT Goals (Current goals can be found in the Care Plan section)  Acute Rehab PT Goals Patient Stated Goal: return to rehab PT Goal Formulation: With patient Time For Goal Achievement: 06/14/18 Potential to Achieve Goals: Fair    Frequency Min 2X/week   Barriers to discharge        Co-evaluation               AM-PAC PT "6 Clicks" Mobility  Outcome Measure Help needed turning from your back to your side while in a flat bed without using bedrails?: A Little Help needed moving from lying on your back to sitting on the side of a flat bed without using bedrails?: A Lot Help needed moving to and from a bed to a chair (including a wheelchair)?: A Lot Help needed standing up from a chair using your arms (e.g., wheelchair or bedside chair)?: A Lot Help needed to walk in hospital room?: A Lot Help needed climbing 3-5 steps with a railing? : Total 6 Click Score: 12    End of Session Equipment Utilized During Treatment: Gait belt Activity Tolerance: Patient limited by fatigue Patient left: in bed;with call bell/phone within reach;with bed alarm set;with family/visitor present Nurse Communication: Mobility status PT Visit Diagnosis: Other abnormalities of gait and mobility (R26.89);Muscle weakness (generalized) (M62.81);Other symptoms and signs involving the nervous system (R29.898)    Time: 3300-7622 PT Time Calculation (min) (ACUTE ONLY): 42 min   Charges:   PT Evaluation $PT Eval Moderate Complexity: 1 Mod PT  Treatments $Therapeutic Activity: 23-37 mins        Magda Kiel, [place] [facility] [phone] 05/31/2018   Reginia Naas 05/31/2018, 1:19 PM

## 2018-05-31 NOTE — Plan of Care (Signed)
  Problem: Education: Goal: Knowledge of General Education information will improve Description Including pain rating scale, medication(s)/side effects and non-pharmacologic comfort measures 05/31/2018 2153 by Talbert Forest, RN Outcome: Progressing 05/31/2018 2152 by Talbert Forest, RN Outcome: Progressing   Problem: Health Behavior/Discharge Planning: Goal: Ability to manage health-related needs will improve 05/31/2018 2153 by Talbert Forest, RN Outcome: Progressing 05/31/2018 2152 by Talbert Forest, RN Outcome: Progressing   Problem: Nutrition: Goal: Adequate nutrition will be maintained 05/31/2018 2153 by Talbert Forest, RN Outcome: Progressing 05/31/2018 2152 by Talbert Forest, RN Outcome: Progressing   Problem: Coping: Goal: Level of anxiety will decrease 05/31/2018 2153 by Talbert Forest, RN Outcome: Progressing 05/31/2018 2152 by Talbert Forest, RN Outcome: Progressing   Problem: Elimination: Goal: Will not experience complications related to urinary retention 05/31/2018 2153 by Talbert Forest, RN Outcome: Progressing 05/31/2018 2152 by Talbert Forest, RN Outcome: Progressing   Problem: Pain Managment: Goal: General experience of comfort will improve 05/31/2018 2153 by Talbert Forest, RN Outcome: Progressing 05/31/2018 2152 by Talbert Forest, RN Outcome: Progressing   Problem: Safety: Goal: Ability to remain free from injury will improve 05/31/2018 2153 by Talbert Forest, RN Outcome: Progressing 05/31/2018 2152 by Talbert Forest, RN Outcome: Progressing   Problem: Skin Integrity: Goal: Risk for impaired skin integrity will decrease Outcome: Progressing

## 2018-05-31 NOTE — Progress Notes (Signed)
PROGRESS NOTE  [name] [name] IRS:[id number] DOB: 15-Feb-1941 DOA: 05/30/2018 PCP: Marin Olp, MD  HPI/Recap of past 24 hours:  She is very weak, need 2 person assist to get out of bed, not able to stand up on herself No fever, she does has some lower abdominal pain She has poor appetite does not eat much Husband at bedside   Assessment/Plan: Principal Problem:   LV (left ventricular) mural thrombus without MI Active Problems:   Hypothyroidism   Spinal cord compression due to malignant neoplasm metastatic to spine ([place])   Small cell carcinoma of lower lobe of left lung ([facility])   BRBPR (bright red blood per rectum)  GI bleed with BRBPR  -She is sent from oncology clinic to admission due to GI bleed and lethargy -No active bleed since in the hospital, though hemoglobin dropped from 10 to 8 -GI consulted, will follow recommendation, transfuse if hemodynamically unstable or hemoglobin drops less than 7  left ventricular thrombus -CT ab report "visualization of a 1.9 x 1.2 x 1.9 cm apical left ventricular clot. This places the patient at increased risk for embolic stroke or other vascular occlusions" -Cardiology consulted, getting echocardiogram to confirm, if echo discission interim anticoagulation in the setting of GI bleed, she is also having high risk of stroke if not to start on anticoagulation ,will follow up on cardiology recommendation   Bilateral ankle pitting edema -We will get venous ultrasound to rule out DVT,  -she has been on Decadron since radiation, this could be side effect from Decadron,  she also have diastolic dysfunction on previous echocardiogram, will consider Lasix if her blood pressure is stable, today blood pressure is borderline low -Elevate legs  Emphysematous cystitis, mild right hydronephrosis and hydroureter with mild diffuse uroepithelial thickening and enhancement, compatible with infection. identified on CT abdomen  On 12/12 -UA with few  bacteria greater than 50 WBC, large leukocyte, urine culture in process, he does has some suprapubic tenderness on deep palpation , but difficult to decide whether this is related to cystitis or her superior pubic ramus fracture -Patient is immunosuppressed, case discussed with infectious disease who recommended treat with antibiotic for 10 to 14 days -Start meropenem for ESBL according to past urine culture in October 2019   Extensive stage small cell lung cancer,Widespread metastatic disease to the bones and liver  widespread metastatic disease involving numerous thoracic vertebrae including T1, T2, T5, T6, T7, T9, T11, and T12. Pathologic fracture T9. Bulky dorsal epidural tumor at T6 results in significant cord compression.   Per previous neurosurgery recommendation " biopsy shows small cell, no role for surgery"  She completed palliative radiotherapy for T6 cord compression in 04/2018.  She is continued on Decadron 4 mg daily.   The patient is status post cycle 2 days 1 and 2 of carboplatin and etoposide. Day 3 on 12/12 was held due to gi bleed and lethargy, she is sent to the ED and hospitalized on the same day Scheduled for PEG filgrastim on 06/01/2018, will need Granix if kept as inpatient per oncology note  CT ab obtained in the ED on 12/12 showed:   Musculoskeletal: Bilateral sacral ala fractures are again demonstrated with interval diffuse patchy bone sclerosis and Nonunion.  Also again demonstrated are right inferior pubic ramus and ischial/anterior acetabular fractures. Mild increase in associated callus formation with nonunion.  Interval visualization of a mildly comminuted right superior pubic ramus fracture with patchy bone sclerosis.  Moderate right hip degenerative changes. Lumbar and  lower thoracic spine degenerative changes with progressive patchy sclerosis involving multiple lumbar and lower thoracic vertebral bodies.  Indigestion -Patient reports indigestion since  started on radiation therapy -Continue PPI  Chronic cancer pain -Continue home meds including fentanyl patch,   COPD /she continue to smoke -No wheezing, no hypoxia at rest  Hypothyroidism, continue Synthroid supplement  Constipation, stool softener   FTT: Very deconditioned and weak, need 2 person assist to get out of bed, not able to stand up by herself   prognosis guarded, patient is at high risk of decompensation due to multiple significant medical illness as described above.      Code Status: full ,confirmed with the patient with husband at bedside  Family Communication: patient and husband  Disposition Plan: not ready to discharge, at risk of decompensation   Consultants:  [facility]  Cardiology  Oncology Dr Julien Nordmann   Infectious disease Dr Baxter Flattery over the phone  Procedures:  none  Antibiotics:  Meropenem    Objective: BP 116/78 (BP Location: Left Arm)   Pulse 82   Temp (!) 97.4 F (36.3 C) (Oral)   Resp 18   Ht 5\' 5"  (1.651 m)   Wt 66.2 kg   SpO2 98%   BMI 24.30 kg/m   Intake/Output Summary (Last 24 hours) at 05/31/2018 1633 Last data filed at 05/31/2018 1447 Gross per 24 hour  Intake 1689.3 ml  Output 400 ml  Net 1289.3 ml   Filed Weights   05/30/18 2331 05/31/18 0540  Weight: 65.9 kg 66.2 kg    Exam: Patient is examined daily including today on 05/31/2018, exams remain the same as of yesterday except that has changed    General:  Very frail and pale, hard of hearing  Cardiovascular: RRR  Respiratory: diminished at basis , no wheezing  Abdomen: she does has mild tender on deep palpation around suprapubic region and left lower quadrant of her abdomen, no guarding, no rebound, Soft/ND, positive BS  Musculoskeletal: bilateral ankle pitting Edema  Neuro: alert, oriented   Data Reviewed: Basic Metabolic Panel: Recent Labs  Lab 05/28/18 1016 05/30/18 1724 05/31/18 0651  NA 136 133* 135  K 4.3 4.0 4.1  CL 101 100 102  CO2 23  22 24   GLUCOSE 152* 96 100*  BUN 20 30* 22  CREATININE 0.70 0.93 0.57  CALCIUM 8.5* 8.2* 7.5*   Liver Function Tests: Recent Labs  Lab 05/28/18 1016  AST 9*  ALT 11  ALKPHOS 181*  BILITOT 0.3  PROT 5.8*  ALBUMIN 2.3*   No results for input(s): LIPASE, AMYLASE in the last 168 hours. No results for input(s): AMMONIA in the last 168 hours. CBC: Recent Labs  Lab 05/28/18 1016 05/30/18 1724 05/31/18 0651  WBC 22.2* 25.5* 15.1*  NEUTROABS 19.3*  --   --   HGB 9.9* 10.2* 8.2*  HCT 31.3* 32.6* 26.1*  MCV 86.5 88.6 88.5  PLT 479* 559* 461*   Cardiac Enzymes:   No results for input(s): CKTOTAL, CKMB, CKMBINDEX, TROPONINI in the last 168 hours. BNP (last 3 results) Recent Labs    04/28/18 0549  BNP 640.3*    ProBNP (last 3 results) No results for input(s): PROBNP in the last 8760 hours.  CBG: No results for input(s): GLUCAP in the last 168 hours.  No results found for this or any previous visit (from the past 240 hour(s)).   Studies: X-ray Chest Pa And Lateral  Result Date: 05/30/2018 CLINICAL DATA:  77 y/o F; history of lung cancer with  metastasis. Completed radiation treatment. Currently receiving chemotherapy. Productive cough. EXAM: CHEST - 2 VIEW COMPARISON:  04/27/2018 chest radiograph. 04/28/2018 CT chest. FINDINGS: Stable normal cardiac silhouette given projection and technique. Right PICC line tip projects over lower SVC. Reticular and patchy opacities greatest in the left lung base. Mass in the left mid lung zone is less evident when compared with prior chest radiograph. No acute osseous abnormality is evident. IMPRESSION: 1. Reticular and patchy opacities greatest in the left lung base probably represents interstitial pulmonary edema. Pneumonia is possible. 2. Left mid lung zone mass is less evident when compared with prior chest radiograph. Electronically Signed   By: Kristine Garbe M.D.   On: 05/30/2018 22:41   Ct Abdomen Pelvis W Contrast  Result  Date: 05/30/2018 CLINICAL DATA:  Abdominal pain. Clinical concern for diverticulitis. Undergoing chemotherapy for metastatic lung cancer. Recently completed radiation treatment. Current smoker. EXAM: CT ABDOMEN AND PELVIS WITH CONTRAST TECHNIQUE: Multidetector CT imaging of the abdomen and pelvis was performed using the standard protocol following bolus administration of intravenous contrast. CONTRAST:  156mL ISOVUE-300 IOPAMIDOL (ISOVUE-300) INJECTION 61% COMPARISON:  04/01/2018. Chest, abdomen and pelvis CT dated 04/28/2018. FINDINGS: Lower chest: Oval area of low density within the apical portion of the left ventricle. This measures 1.9 x 1.2 cm on image number 9 series 2 and 1.9 cm in length on sagittal image number 93. This was not previously present. Mild bibasilar atelectasis/scarring. Mild diffuse peribronchial thickening and bullous changes. Hepatobiliary: The previously demonstrated 3.8 x 2.7 cm mass in segment 8 of the liver on the right currently measures 1.6 x 1.3 cm in maximum diameter on image number 9 series 2. The previously demonstrated 3.4 x 2.8 cm oval mass in segment 4B/5, currently measures 2.1 cm in maximum diameter on image number 24 series 2. The previously demonstrated 1.5 x 1.5 cm mass in the central aspect of segment 4A is not visualized today. Also demonstrated is interval visualization of a 6 mm probable cyst in the posterior segment of the right lobe of the liver on image number 16 series 2. Unremarkable gallbladder. Pancreas: Diffuse pancreatic atrophy, most pronounced involving the head and neck. Spleen: Normal in size without focal abnormality. Adrenals/Urinary Tract: Normal appearing adrenal glands. Small right renal cysts. Small left renal vascular calcification. Mild diffuse wall thickening and enhancement of the right ureter and right renal collecting system with mild dilatation. Normal appearing left ureter. Gas throughout the walls of the urinary bladder with nondependent air  or gas in the bladder. Stomach/Bowel: Large amount of stool throughout the colon. Mild sigmoid colon diverticulosis without evidence of diverticulitis. Unremarkable stomach and small bowel. No evidence of appendicitis. Vascular/Lymphatic: Atheromatous arterial calcifications without aneurysm. No enlarged lymph nodes. Reproductive: Uterus and bilateral adnexa are unremarkable. Other: Small left inguinal hernia containing fat. Musculoskeletal: Bilateral sacral ala fractures are again demonstrated with interval diffuse patchy bone sclerosis and nonunion. Also again demonstrated are right inferior pubic ramus and ischial/anterior acetabular fractures. Mild increase in associated callus formation with nonunion. Interval visualization of a mildly comminuted right superior pubic ramus fracture with patchy bone sclerosis. Moderate right hip degenerative changes. Lumbar and lower thoracic spine degenerative changes with progressive patchy sclerosis involving multiple lumbar and lower thoracic vertebral bodies. IMPRESSION: 1. Interval emphysematous cystitis. 2. Interval visualization of a 1.9 x 1.2 x 1.9 cm apical left ventricular clot. This places the patient at increased risk for embolic stroke or other vascular occlusions. 3. Interval mild right hydronephrosis and hydroureter with mild diffuse uroepithelial  thickening and enhancement, compatible with infection. 4. Interval decrease in size of the previously demonstrated liver metastases. 5. Progressive sclerotic bony metastatic disease with nonunion of multiple previous fractures as described above as well as nonunion of an interval comminuted superior pubic ramus fracture on the right. 6. Mild changes of COPD and chronic bronchitis. 7. Large amount of stool throughout the colon. 8. Mild sigmoid diverticulosis without evidence of diverticulitis. These results were called by telephone at the time of interpretation on 05/30/2018 at 8:11 pm to Dr. Lacretia Leigh , who verbally  acknowledged these results. Electronically Signed   By: Claudie Revering M.D.   On: 05/30/2018 20:23    Scheduled Meds: . dexamethasone  4 mg Oral Daily  . feeding supplement (ENSURE ENLIVE)  237 mL Oral BID BM  . fentaNYL  25 mcg Transdermal Q72H  . levothyroxine  75 mcg Oral QAC breakfast  . nystatin  5 mL Oral QID  . pantoprazole  40 mg Oral Daily  . polyethylene glycol  17 g Oral BID  . senna  1 tablet Oral BID  . sodium chloride flush  3 mL Intravenous Q12H    Continuous Infusions:   Time spent: 21mins, case discussed with oncology, cardiology, GI and infectious disease I have personally reviewed and interpreted on  05/31/2018 daily labs, tele strips, imagings as discussed above under date review session and assessment and plans.  I reviewed all nursing notes, pharmacy notes, consultant notes,  vitals, pertinent old records  I have discussed plan of care as described above with RN , patient and family on 05/31/2018   Florencia Reasons MD, PhD  [facility] Pager [phone]. If 7PM-7AM, please contact night-coverage at [email or web address], password [facility] 05/31/2018, 4:33 PM  LOS: 1 day

## 2018-05-31 NOTE — Consult Note (Addendum)
Cardiology Consultation:   Patient ID: [name] [name] MRN: [id number]; DOB: 1940-11-27  Admit date: 05/30/2018 Date of Consult: 05/31/2018  Primary Care Provider: Marin Olp, MD Primary Cardiologist: New (last seen in 2012- was being seen for lipid management only) Primary Electrophysiologist:  None    Patient Profile:   [name] [name] is a 77 y.o. female with a hx significant for small cell lung CA w/ metastases to the liver and spine with cord compression on chemo and palliative radiation, HLD, tobacco abuse, hypothyroidism and diverticulosis, who presented from oncologist office due to abdominal pain, increased lethargy and BRBPR. She was admitted for GIB. As part of work-up, abdominal CT was obtained and there was an incidental finding of a 1.9 x 1.2 x 1.9 cm apical left ventricular clot, for which cardiology has been consulted at the request of Dr. Erlinda Hong, Internal Medicine.   History of Present Illness:    [name] [name] is a 77 y.o. female with a hx significant for small cell lung CA w/ metastases to the liver and spine with cord compression on chemo and palliative radiation, HLD, tobacco abuse, hypothyroidism and diverticulosis, who presented from oncologist office due to abdominal pain, increased lethargy and BRBPR. She was admitted for GIB. As part of work-up, abdominal CT was obtained and there was an incidental finding of a 1.9 x 1.2 x 1.9 cm apical left ventricular clot, for which cardiology has been consulted at the request of Dr. Erlinda Hong, Internal Medicine.   2D Echo is pending to better evaluate. She had an echo 04/2018 that showed low normal LVEF at 50% with hypokinesis of the mid-apicalanteroseptal myocardium ab hypokinesis of the apicalinferior myocardium. No thrombus mention at that time.   GI has seen pt today. They suspect either diverticular bleed vs hemorrhoids. Her Hgb today is 8.2. Baseline is ~10. She had not had a BM today.   No cardiac symptoms. Denies CP  and dyspnea.   Past Medical History:  Diagnosis Date  . Asymptomatic varicose veins   . Cataract    beginnings  . Cigarette smoker   . COPD (chronic obstructive pulmonary disease) ([place])   . Diverticulosis of colon (without mention of hemorrhage)   . Headache(784.0)   . Hearing loss    wears hearing aides  . Lumbar back pain   . Pure hypercholesterolemia   . Stress disorder, acute   . Varicose veins     Past Surgical History:  Procedure Laterality Date  . COLONOSCOPY  04-22-2004  . LUMBAR SPINE SURGERY     [phone]  . varicose vein treatment     at [facility]     Home Medications:  Prior to Admission medications   Medication Sig Start Date End Date Taking? Authorizing Provider  ALPRAZolam Duanne Moron) 0.5 MG tablet Take 1 tablet (0.5 mg total) by mouth 2 (two) times daily as needed for anxiety or sleep. 05/11/18  Yes Shelly Coss, MD  dexamethasone (DECADRON) 4 MG tablet Take 1 tablet (4 mg total) by mouth daily. 05/28/18  Yes Alla Feeling, NP  feeding supplement, ENSURE ENLIVE, (ENSURE ENLIVE) LIQD Take 237 mLs by mouth 2 (two) times daily between meals. 05/11/18  Yes Shelly Coss, MD  fentaNYL (DURAGESIC - DOSED MCG/HR) 25 MCG/HR patch Place 1 patch (25 mcg total) onto the skin every 3 (three) days. 05/12/18  Yes Adhikari, Tamsen Meek, MD  levothyroxine (SYNTHROID, LEVOTHROID) 75 MCG tablet TAKE 1 TABLET(75 MCG) BY MOUTH DAILY Patient taking differently: Take 75 mcg by  mouth daily before breakfast.  04/03/18  Yes Marin Olp, MD  lidocaine (XYLOCAINE) 2 % solution Use as directed 15 mLs in the mouth or throat every 3 (three) hours as needed for mouth pain (mouth ulcers). 05/11/18  Yes Shelly Coss, MD  nicotine polacrilex (NICORETTE) 2 MG gum Take 1 each (2 mg total) by mouth as needed for smoking cessation. 03/26/18  Yes Patrecia Pour, MD  nystatin (MYCOSTATIN) 100000 UNIT/ML suspension Take 5 mLs (500,000 Units total) by mouth 4 (four) times daily. 05/11/18   Yes Shelly Coss, MD  omeprazole (PRILOSEC) 40 MG capsule Take 1 capsule (40 mg total) by mouth daily. 05/28/18  Yes Alla Feeling, NP  ondansetron (ZOFRAN-ODT) 4 MG disintegrating tablet Take 1 tablet (4 mg total) by mouth every 8 (eight) hours as needed for nausea or vomiting. 03/04/18  Yes Marin Olp, MD  polyethylene glycol (MIRALAX / GLYCOLAX) packet Take 17 g by mouth daily. 05/11/18  Yes Shelly Coss, MD  prochlorperazine (COMPAZINE) 10 MG tablet Take 1 tablet (10 mg total) by mouth every 6 (six) hours as needed for nausea or vomiting. 05/28/18  Yes Alla Feeling, NP  senna (SENOKOT) 8.6 MG TABS tablet Take 1 tablet (8.6 mg total) by mouth 2 (two) times daily. 03/26/18  Yes Patrecia Pour, MD    Inpatient Medications: Scheduled Meds: . dexamethasone  4 mg Oral Daily  . feeding supplement (ENSURE ENLIVE)  237 mL Oral BID BM  . fentaNYL  25 mcg Transdermal Q72H  . levothyroxine  75 mcg Oral QAC breakfast  . nystatin  5 mL Oral QID  . pantoprazole  40 mg Oral Daily  . polyethylene glycol  17 g Oral Daily  . senna  1 tablet Oral BID  . sodium chloride flush  3 mL Intravenous Q12H   Continuous Infusions:  PRN Meds: acetaminophen **OR** acetaminophen, lidocaine, ondansetron **OR** ondansetron (ZOFRAN) IV, prochlorperazine, sodium chloride flush  Allergies:    Allergies  Allergen Reactions  . Atorvastatin     Intolerant to all statins   . Cyclobenzaprine Other (See Comments)    Bradycardia, hypotension  . Ezetimibe      INTOL to Zetia  . Rosuvastatin     intolerant to all statins  . Gadolinium Derivatives Nausea Only    Pt became very nauseous after gadolinium administration//lh  . Nicoderm [Nicotine] Rash    Unable to use the patches---caused severe rash to area placed    Social History:   Social History   Socioeconomic History  . Marital status: Married    Spouse name: Not on file  . Number of children: 2  . Years of education: Not on file  . Highest  education level: Not on file  Occupational History  . Occupation: retired    Fish farm manager: RETIRED  Social Needs  . Financial resource strain: Not on file  . Food insecurity:    Worry: Not on file    Inability: Not on file  . Transportation needs:    Medical: Not on file    Non-medical: Not on file  Tobacco Use  . Smoking status: Current Every Day Smoker    Packs/day: 0.50    Years: 40.00    Pack years: 20.00    Types: Cigarettes  . Smokeless tobacco: Never Used  . Tobacco comment: 1/2-3/4 of a pk a day   Substance and Sexual Activity  . Alcohol use: No    Alcohol/week: 0.0 standard drinks  . Drug use: No  .  Sexual activity: Not on file  Lifestyle  . Physical activity:    Days per week: Not on file    Minutes per session: Not on file  . Stress: Not on file  Relationships  . Social connections:    Talks on phone: Not on file    Gets together: Not on file    Attends religious service: Not on file    Active member of club or organization: Not on file    Attends meetings of clubs or organizations: Not on file    Relationship status: Not on file  . Intimate partner violence:    Fear of current or ex partner: Not on file    Emotionally abused: Not on file    Physically abused: Not on file    Forced sexual activity: Not on file  Other Topics Concern  . Not on file  Social History Narrative   Married 2008 (3rd marriage). 2 sons by first husband. 3 grandkids.       Retired Art gallery manager for 40 years      Hobbies: Designer, multimedia games, go to casino ([place]    Family History:    Family History  Problem Relation Age of Onset  . Diabetes Father   . Heart attack Mother 110       nonsmoker  . Colon cancer Neg Hx   . Rectal cancer Neg Hx   . Stomach cancer Neg Hx      ROS:  Please see the history of present illness.   All other ROS reviewed and negative.     Physical Exam/Data:   Vitals:   05/30/18 2200 05/30/18 2330 05/30/18 2331 05/31/18 0540  BP:  (!) 107/59 112/64  116/75  Pulse: 65 84  68  Resp: 17   16  Temp:  98.1 F (36.7 C)  97.9 F (36.6 C)  TempSrc:    Oral  SpO2: 93% 98%  98%  Weight:   65.9 kg 66.2 kg  Height:   5\' 5"  (1.651 m)     Intake/Output Summary (Last 24 hours) at 05/31/2018 0913 Last data filed at 05/31/2018 0200 Gross per 24 hour  Intake 1569.3 ml  Output -  Net 1569.3 ml   Filed Weights   05/30/18 2331 05/31/18 0540  Weight: 65.9 kg 66.2 kg   Body mass index is 24.3 kg/m.  General:  Elderly WF in no acute distress HEENT: normal Lymph: no adenopathy Neck: no JVD Endocrine:  No thryomegaly Vascular: No carotid bruits; FA pulses 2+ bilaterally without bruits  Cardiac:  normal S1, S2; RRR; no murmur  Lungs:  clear to auscultation bilaterally, no wheezing, rhonchi or rales  Abd: soft, nontender, no hepatomegaly  Ext: trace bilateral LEE Musculoskeletal:  No deformities, BUE and BLE strength normal and equal Skin: warm and dry  Neuro:  CNs 2-12 intact, no focal abnormalities noted Psych:  Normal affect   EKG:  The EKG was personally reviewed and demonstrates:  NSR, unchanged from previous Telemetry:  Telemetry was personally reviewed and demonstrates:  NSR  Relevant CV Studies: 2D Echo 05/31/18- pending  Laboratory Data:  Chemistry Recent Labs  Lab 05/28/18 1016 05/30/18 1724 05/31/18 0651  NA 136 133* 135  K 4.3 4.0 4.1  CL 101 100 102  CO2 23 22 24   GLUCOSE 152* 96 100*  BUN 20 30* 22  CREATININE 0.70 0.93 0.57  CALCIUM 8.5* 8.2* 7.5*  GFRNONAA >60 58* >60  GFRAA >60 >60 >60  ANIONGAP 12 11  9    Recent Labs  Lab 05/28/18 1016  PROT 5.8*  ALBUMIN 2.3*  AST 9*  ALT 11  ALKPHOS 181*  BILITOT 0.3   Hematology Recent Labs  Lab 05/28/18 1016 05/30/18 1724 05/31/18 0651  WBC 22.2* 25.5* 15.1*  RBC 3.62* 3.68* 2.95*  HGB 9.9* 10.2* 8.2*  HCT 31.3* 32.6* 26.1*  MCV 86.5 88.6 88.5  MCH 27.3 27.7 27.8  MCHC 31.6 31.3 31.4  RDW 17.3* 17.3* 17.2*  PLT 479* 559* 461*     Cardiac EnzymesNo results for input(s): TROPONINI in the last 168 hours. No results for input(s): TROPIPOC in the last 168 hours.  BNPNo results for input(s): BNP, PROBNP in the last 168 hours.  DDimer No results for input(s): DDIMER in the last 168 hours.  Radiology/Studies:  X-ray Chest Pa And Lateral  Result Date: 05/30/2018 CLINICAL DATA:  77 y/o F; history of lung cancer with metastasis. Completed radiation treatment. Currently receiving chemotherapy. Productive cough. EXAM: CHEST - 2 VIEW COMPARISON:  04/27/2018 chest radiograph. 04/28/2018 CT chest. FINDINGS: Stable normal cardiac silhouette given projection and technique. Right PICC line tip projects over lower SVC. Reticular and patchy opacities greatest in the left lung base. Mass in the left mid lung zone is less evident when compared with prior chest radiograph. No acute osseous abnormality is evident. IMPRESSION: 1. Reticular and patchy opacities greatest in the left lung base probably represents interstitial pulmonary edema. Pneumonia is possible. 2. Left mid lung zone mass is less evident when compared with prior chest radiograph. Electronically Signed   By: Kristine Garbe M.D.   On: 05/30/2018 22:41   Ct Abdomen Pelvis W Contrast  Result Date: 05/30/2018 CLINICAL DATA:  Abdominal pain. Clinical concern for diverticulitis. Undergoing chemotherapy for metastatic lung cancer. Recently completed radiation treatment. Current smoker. EXAM: CT ABDOMEN AND PELVIS WITH CONTRAST TECHNIQUE: Multidetector CT imaging of the abdomen and pelvis was performed using the standard protocol following bolus administration of intravenous contrast. CONTRAST:  113mL ISOVUE-300 IOPAMIDOL (ISOVUE-300) INJECTION 61% COMPARISON:  04/01/2018. Chest, abdomen and pelvis CT dated 04/28/2018. FINDINGS: Lower chest: Oval area of low density within the apical portion of the left ventricle. This measures 1.9 x 1.2 cm on image number 9 series 2 and 1.9 cm in  length on sagittal image number 93. This was not previously present. Mild bibasilar atelectasis/scarring. Mild diffuse peribronchial thickening and bullous changes. Hepatobiliary: The previously demonstrated 3.8 x 2.7 cm mass in segment 8 of the liver on the right currently measures 1.6 x 1.3 cm in maximum diameter on image number 9 series 2. The previously demonstrated 3.4 x 2.8 cm oval mass in segment 4B/5, currently measures 2.1 cm in maximum diameter on image number 24 series 2. The previously demonstrated 1.5 x 1.5 cm mass in the central aspect of segment 4A is not visualized today. Also demonstrated is interval visualization of a 6 mm probable cyst in the posterior segment of the right lobe of the liver on image number 16 series 2. Unremarkable gallbladder. Pancreas: Diffuse pancreatic atrophy, most pronounced involving the head and neck. Spleen: Normal in size without focal abnormality. Adrenals/Urinary Tract: Normal appearing adrenal glands. Small right renal cysts. Small left renal vascular calcification. Mild diffuse wall thickening and enhancement of the right ureter and right renal collecting system with mild dilatation. Normal appearing left ureter. Gas throughout the walls of the urinary bladder with nondependent air or gas in the bladder. Stomach/Bowel: Large amount of stool throughout the colon. Mild sigmoid colon diverticulosis  without evidence of diverticulitis. Unremarkable stomach and small bowel. No evidence of appendicitis. Vascular/Lymphatic: Atheromatous arterial calcifications without aneurysm. No enlarged lymph nodes. Reproductive: Uterus and bilateral adnexa are unremarkable. Other: Small left inguinal hernia containing fat. Musculoskeletal: Bilateral sacral ala fractures are again demonstrated with interval diffuse patchy bone sclerosis and nonunion. Also again demonstrated are right inferior pubic ramus and ischial/anterior acetabular fractures. Mild increase in associated callus  formation with nonunion. Interval visualization of a mildly comminuted right superior pubic ramus fracture with patchy bone sclerosis. Moderate right hip degenerative changes. Lumbar and lower thoracic spine degenerative changes with progressive patchy sclerosis involving multiple lumbar and lower thoracic vertebral bodies. IMPRESSION: 1. Interval emphysematous cystitis. 2. Interval visualization of a 1.9 x 1.2 x 1.9 cm apical left ventricular clot. This places the patient at increased risk for embolic stroke or other vascular occlusions. 3. Interval mild right hydronephrosis and hydroureter with mild diffuse uroepithelial thickening and enhancement, compatible with infection. 4. Interval decrease in size of the previously demonstrated liver metastases. 5. Progressive sclerotic bony metastatic disease with nonunion of multiple previous fractures as described above as well as nonunion of an interval comminuted superior pubic ramus fracture on the right. 6. Mild changes of COPD and chronic bronchitis. 7. Large amount of stool throughout the colon. 8. Mild sigmoid diverticulosis without evidence of diverticulitis. These results were called by telephone at the time of interpretation on 05/30/2018 at 8:11 pm to Dr. Lacretia Leigh , who verbally acknowledged these results. Electronically Signed   By: Claudie Revering M.D.   On: 05/30/2018 20:23    Assessment and Plan:   TACIA HINDLEY is a 77 y.o. female with a hx significant for small cell lung CA w/ metastases to the liver and spine with cord compression on chemo and palliative radiation, HLD, tobacco abuse, hypothyroidism and diverticulosis, who presented from oncologist office due to abdominal pain, increased lethargy and BRBPR. She was admitted for GIB. As part of work-up, abdominal CT was obtained and there was an incidental finding of a 1.9 x 1.2 x 1.9 cm apical left ventricular clot, for which cardiology has been consulted at the request of Dr. Erlinda Hong, Internal  Medicine.   1. ? LV Thrombus: incidental finding on abdominal CT. Radiologist noted a 1.9 x 1.2 x 1.9 cm apical left ventricular clot. She had an echocardiogram in November that showed low normal LVEF but WMAs in that particular territory. No LV thrombus was noted at that time. A repeat echo has been ordered to re-evaluate. Order placed to be done with Definity. If thrombus is confirmed, she would be at an increased risk of embolic stoke of other vascular occlusions. Would typically treat LV thrombus with anticoagulation, however in the setting of metastatic lung cancer and recent GIB, she may not be a candidate for anticoagulation. Would have to weight risk vs benefit. Would need input from oncology and GI.   2. GIB: pt seen by GI this morning. DD currently includes diverticular bleed vs hemorrhoids.  CT scan shows a large amount of stool throughout the colon.  Constipation possibly contributing. Her Hgb today is 8.2. Baseline is ~10. She had not had a BM today.   3. Metastatic Lung Cancer: management per oncology.   For questions or updates, please contact [place] Please consult [email or web address] for contact info under     Signed, Lyda Jester, PA-C  05/31/2018 9:13 AM   I have personally seen and examined this patient with Lyda Jester, PA-C. I agree with the  assessment and plan as outlined above. Ms. Rahming is a 48 female with small cell lung cancer with mets to her liver and spine with cord compression. This was diagnosed last month. She is now on chemotherapy and palliative radiation. Additional history of HLD, hypothyroidism and tobacco abuse. She is admitted with abdominal pain, lethargy and active GI bleeding. Her GI bleeding is felt to possibly be due to a diverticular bleed vs hemorrhoids. Abdominal CT showed a possible left ventricular thrombus. Echo in November 2019 with apical wall motion abnormality. Defintiy was not used for LV opacification during that echo. I have  personally reviewed the echo from November 2019. Labs reviewed by me. Hbg 8.2. Renal function is normal.  EKG personally reviewed by me and shows sinus rhythm with poor R wave progression through the precordial leads, cannot exclude old anterior infarct. Telemetry reviewed by me and shows sinus rhythm. She has no chest pain.   My exam: General: Well developed, well nourished, NAD  HEENT: OP clear, mucus membranes moist  SKIN: warm, dry. No rashes. Neuro: No focal deficits  Musculoskeletal: Muscle strength 5/5 all ext  Psychiatric: Mood and affect normal  Neck: No JVD, no carotid bruits, no thyromegaly, no lymphadenopathy.  Lungs:Clear bilaterally, no wheezes, rhonci, crackles Cardiovascular: Regular rate and rhythm. No murmurs, gallops or rubs. Abdomen:Soft. Bowel sounds present. Non-tender.  Extremities: Trace bilateral lower extremity edema. Pulses are 2 + in the bilateral DP/PT.  Plan:  1. Possible left ventricular thrombus: She has LV systolic dysfunction that is favorable for thrombus formation with akinesis of the apex. Echo today with Definity to assess for thrombus. If there is LV thrombus present, our recommendations would be for anti-coagulation. Given her GI bleeding and anemia, she may not be a good candidate for anti-coagulation. Risk of stroke or other embolic event is high if LV thrombus is present. We will follow along this weekend. Ultimately, decision regarding starting long term anti-coagulation would have to be made by her oncology and GI teams. I would not consider invasive cardiac workup to exclude CAD as she has no angina and has stage 4 lung cancer.   Lauree Chandler 05/31/2018 1:04 PM

## 2018-05-31 NOTE — Consult Note (Addendum)
Referring Provider:  Dr. Erlinda Hong, [facility] Primary Care Physician:  Marin Olp, MD Primary Gastroenterologist:  Dr. Olevia Perches   Reason for Consultation:  GI bleed  HPI: [name] [name] is a 77 y.o. 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  Patient states that she has had inability to walk since starting palliative radiotherapy for her T6 metastatic bone disease.  She was sent to the ED due to these issues at her oncologist's office and GI was consulted for GI bleed.  ED Course:  Initial vitals in the ED showed BP 80/61, pulse 67, RR 20, temp 98.33F, SPO2 95% on room air.  Labs were notable for WBC 25.5 (22.2 two days ago, up from 0.5 after neulasta treatment 05/14/18), hemoglobin 10.2 grams (near her recent baseline), platelets 559, BUN 30, creatinine 0.93.  Urinalysis showed large leukocytes, negative nitrites, few bacteria, 6-10 RBCs on microscopy.  CT scan abdomen and pelvis with contrast showed the following:  IMPRESSION: 1. Interval emphysematous cystitis. 2. Interval visualization of a 1.9 x 1.2 x 1.9 cm apical left ventricular clot. This places the patient at increased risk for embolic stroke or other vascular occlusions. 3. Interval mild right hydronephrosis and hydroureter with mild diffuse uroepithelial thickening and enhancement, compatible with infection. 4. Interval decrease in size of the previously demonstrated liver metastases. 5. Progressive sclerotic bony metastatic disease with nonunion of multiple previous fractures as described above as well as nonunion of an interval comminuted superior pubic ramus fracture on the right. 6. Mild  changes of COPD and chronic bronchitis. 7. Large amount of stool throughout the colon. 8. Mild sigmoid diverticulosis without evidence of diverticulitis.  Hgb down to 8.2 grams this AM.  She reports no knowledge of any rectal bleeding prior to yesterday at the oncology office.  Her nurse reports no further sign of bleeding since her admission.  She is on a regular diet and was seen eating breakfast.  No family at bedside during my visit.  Reports generalized abdominal pain.  Says that she gets a lot of heartburn and is belching while I am in the room with her.  Is on pantoprazole 40 mg daily here in the hospital as well as Miralax daily.  Looks like she is on daily PPI and daily Miralax at home as well.  Colonoscopy 08/2014:  ENDOSCOPIC IMPRESSION: There was moderate diverticulosis noted in the descending colon Redundant tortuous colon, difficult exam   Past Medical History:  Diagnosis Date  . Asymptomatic varicose veins   . Cataract    beginnings  . Cigarette smoker   . COPD (chronic obstructive pulmonary disease) ([place])   . Diverticulosis of colon (without mention of hemorrhage)   . Headache(784.0)   . Hearing loss    wears hearing aides  . Lumbar back pain   . Pure hypercholesterolemia   . Stress disorder, acute   . Varicose veins     Past Surgical History:  Procedure Laterality Date  . COLONOSCOPY  04-22-2004  . LUMBAR SPINE SURGERY     [phone]  . varicose vein treatment     at [facility] vein center    Prior to Admission medications   Medication Sig Start Date End Date Taking? Authorizing Provider  ALPRAZolam (XANAX) 0.5 MG tablet Take 1 tablet (0.5 mg total) by mouth 2 (two) times daily as needed for anxiety or sleep. 05/11/18  Yes Shelly Coss, MD  dexamethasone (DECADRON) 4 MG tablet Take 1 tablet (4 mg total) by mouth daily. 05/28/18  Yes Alla Feeling, NP  feeding supplement, ENSURE ENLIVE, (ENSURE ENLIVE) LIQD Take 237 mLs by mouth 2 (two) times daily between  meals. 05/11/18  Yes Shelly Coss, MD  fentaNYL (DURAGESIC - DOSED MCG/HR) 25 MCG/HR patch Place 1 patch (25 mcg total) onto the skin every 3 (three) days. 05/12/18  Yes Shelly Coss, MD  levothyroxine (SYNTHROID, LEVOTHROID) 75 MCG tablet TAKE 1 TABLET(75 MCG) BY MOUTH DAILY Patient taking differently: Take 75 mcg by mouth daily before breakfast.  04/03/18  Yes Marin Olp, MD  lidocaine (XYLOCAINE) 2 % solution Use as directed 15 mLs in the mouth or throat every 3 (three) hours as needed for mouth pain (mouth ulcers). 05/11/18  Yes Shelly Coss, MD  nicotine polacrilex (NICORETTE) 2 MG gum Take 1 each (2 mg total) by mouth as needed for smoking cessation. 03/26/18  Yes Patrecia Pour, MD  nystatin (MYCOSTATIN) 100000 UNIT/ML suspension Take 5 mLs (500,000 Units total) by mouth 4 (four) times daily. 05/11/18  Yes Shelly Coss, MD  omeprazole (PRILOSEC) 40 MG capsule Take 1 capsule (40 mg total) by mouth daily. 05/28/18  Yes Alla Feeling, NP  ondansetron (ZOFRAN-ODT) 4 MG disintegrating tablet Take 1 tablet (4 mg total) by mouth every 8 (eight) hours as needed for nausea or vomiting. 03/04/18  Yes Marin Olp, MD  polyethylene glycol (MIRALAX / GLYCOLAX) packet Take 17 g by mouth daily. 05/11/18  Yes Shelly Coss, MD  prochlorperazine (COMPAZINE) 10 MG tablet Take 1 tablet (10 mg total) by mouth every 6 (six) hours as needed for nausea or vomiting. 05/28/18  Yes Alla Feeling, NP  senna (SENOKOT) 8.6 MG TABS tablet Take 1 tablet (8.6 mg total) by mouth 2 (two) times daily. 03/26/18  Yes Patrecia Pour, MD    Current Facility-Administered Medications  Medication Dose Route Frequency Provider Last Rate Last Dose  . acetaminophen (TYLENOL) tablet 650 mg  650 mg Oral Q6H PRN Lenore Cordia, MD       Or  . acetaminophen (TYLENOL) suppository 650 mg  650 mg Rectal Q6H PRN Lenore Cordia, MD      . dexamethasone (DECADRON) tablet 4 mg  4 mg Oral Daily Patel, Vishal R, MD        . feeding supplement (ENSURE ENLIVE) (ENSURE ENLIVE) liquid 237 mL  237 mL Oral BID BM Patel, Vishal R, MD      . fentaNYL (DURAGESIC - dosed mcg/hr) patch 25 mcg  25 mcg Transdermal Q72H Patel, Vishal R, MD      . levothyroxine (SYNTHROID, LEVOTHROID) tablet 75 mcg  75 mcg Oral QAC breakfast Patel, Vishal R, MD      . lidocaine (XYLOCAINE) 2 % viscous mouth solution 15 mL  15 mL Mouth/Throat Q3H PRN Zada Finders R, MD      . nystatin (MYCOSTATIN) 100000 UNIT/ML suspension 500,000 Units  5 mL Oral QID Zada Finders R, MD      . ondansetron (ZOFRAN) tablet 4 mg  4 mg Oral Q6H PRN Lenore Cordia, MD       Or  . ondansetron (ZOFRAN) injection 4 mg  4 mg Intravenous Q6H PRN Lenore Cordia, MD      . pantoprazole (  PROTONIX) EC tablet 40 mg  40 mg Oral Daily Patel, Vishal R, MD      . polyethylene glycol (MIRALAX / GLYCOLAX) packet 17 g  17 g Oral Daily Zada Finders R, MD      . prochlorperazine (COMPAZINE) tablet 10 mg  10 mg Oral Q6H PRN Lenore Cordia, MD      . senna (SENOKOT) tablet 8.6 mg  1 tablet Oral BID Zada Finders R, MD      . sodium chloride flush (NS) 0.9 % injection 10-40 mL  10-40 mL Intracatheter PRN Florencia Reasons, MD      . sodium chloride flush (NS) 0.9 % injection 3 mL  3 mL Intravenous Q12H Lenore Cordia, MD        Allergies as of 05/30/2018 - Review Complete 05/30/2018  Allergen Reaction Noted  . Atorvastatin    . Cyclobenzaprine Other (See Comments) 02/21/2018  . Ezetimibe    . Rosuvastatin    . Gadolinium derivatives Nausea Only 06/10/2015  . Nicoderm [nicotine] Rash 08/15/2012    Family History  Problem Relation Age of Onset  . Diabetes Father   . Heart attack Mother 23       nonsmoker  . Colon cancer Neg Hx   . Rectal cancer Neg Hx   . Stomach cancer Neg Hx     Social History   Socioeconomic History  . Marital status: Married    Spouse name: Not on file  . Number of children: 2  . Years of education: Not on file  . Highest education level: Not on  file  Occupational History  . Occupation: retired    Fish farm manager: RETIRED  Social Needs  . Financial resource strain: Not on file  . Food insecurity:    Worry: Not on file    Inability: Not on file  . Transportation needs:    Medical: Not on file    Non-medical: Not on file  Tobacco Use  . Smoking status: Current Every Day Smoker    Packs/day: 0.50    Years: 40.00    Pack years: 20.00    Types: Cigarettes  . Smokeless tobacco: Never Used  . Tobacco comment: 1/2-3/4 of a pk a day   Substance and Sexual Activity  . Alcohol use: No    Alcohol/week: 0.0 standard drinks  . Drug use: No  . Sexual activity: Not on file  Lifestyle  . Physical activity:    Days per week: Not on file    Minutes per session: Not on file  . Stress: Not on file  Relationships  . Social connections:    Talks on phone: Not on file    Gets together: Not on file    Attends religious service: Not on file    Active member of club or organization: Not on file    Attends meetings of clubs or organizations: Not on file    Relationship status: Not on file  . Intimate partner violence:    Fear of current or ex partner: Not on file    Emotionally abused: Not on file    Physically abused: Not on file    Forced sexual activity: Not on file  Other Topics Concern  . Not on file  Social History Narrative   Married 2008 (3rd marriage). 2 sons by first husband. 3 grandkids.       Retired [employer] for 40 years      Hobbies: Designer, multimedia games, go to casino [place]  Review of Systems: ROS is O/W negative except as mentioned in HPI.  Physical Exam: Vital signs in last 24 hours: Temp:  [97.5 F (36.4 C)-98.2 F (36.8 C)] 97.9 F (36.6 C) (12/13 0540) Pulse Rate:  [65-87] 68 (12/13 0540) Resp:  [15-22] 16 (12/13 0540) BP: (80-128)/(57-76) 116/75 (12/13 0540) SpO2:  [91 %-98 %] 98 % (12/13 0540) Weight:  [65.9 kg-66.2 kg] 66.2 kg (12/13 0540)   General:  Alert, Well-developed,  well-nourished, pleasant and cooperative in NAD Head:  Normocephalic and atraumatic. Eyes:  Sclera clear, no icterus.  Conjunctiva pink. Ears:  Normal auditory acuity. Mouth:  No deformity or lesions.   Lungs:  Clear throughout to auscultation.  No wheezes, crackles, or rhonchi.  Heart:  Regular rate and rhythm; no murmurs, clicks, rubs, or gallops. Abdomen:  Soft, non-distended.  BS present.  Diffuse TTP. Msk:  Symmetrical without gross deformities.  Pulses:  Normal pulses noted. Extremities:  Without clubbing or edema. Neurologic:  Alert and oriented x 4;  grossly normal neurologically. Skin:  Intact without significant lesions or rashes. Psych:  Alert and cooperative. Normal mood and affect.  Intake/Output from previous day: 12/12 0701 - 12/13 0700 In: 1569.3 [P.O.:300; I.V.:269.3; IV Piggyback:1000] Out: -   Lab Results: Recent Labs    05/28/18 1016 05/30/18 1724 05/31/18 0651  WBC 22.2* 25.5* 15.1*  HGB 9.9* 10.2* 8.2*  HCT 31.3* 32.6* 26.1*  PLT 479* 559* 461*   BMET Recent Labs    05/28/18 1016 05/30/18 1724 05/31/18 0651  NA 136 133* 135  K 4.3 4.0 4.1  CL 101 100 102  CO2 23 22 24   GLUCOSE 152* 96 100*  BUN 20 30* 22  CREATININE 0.70 0.93 0.57  CALCIUM 8.5* 8.2* 7.5*   LFT Recent Labs    05/28/18 1016  PROT 5.8*  ALBUMIN 2.3*  AST 9*  ALT 11  ALKPHOS 181*  BILITOT 0.3   Studies/Results: X-ray Chest Pa And Lateral  Result Date: 05/30/2018 CLINICAL DATA:  77 y/o F; history of lung cancer with metastasis. Completed radiation treatment. Currently receiving chemotherapy. Productive cough. EXAM: CHEST - 2 VIEW COMPARISON:  04/27/2018 chest radiograph. 04/28/2018 CT chest. FINDINGS: Stable normal cardiac silhouette given projection and technique. Right PICC line tip projects over lower SVC. Reticular and patchy opacities greatest in the left lung base. Mass in the left mid lung zone is less evident when compared with prior chest radiograph. No acute  osseous abnormality is evident. IMPRESSION: 1. Reticular and patchy opacities greatest in the left lung base probably represents interstitial pulmonary edema. Pneumonia is possible. 2. Left mid lung zone mass is less evident when compared with prior chest radiograph. Electronically Signed   By: Kristine Garbe M.D.   On: 05/30/2018 22:41   Ct Abdomen Pelvis W Contrast  Result Date: 05/30/2018 CLINICAL DATA:  Abdominal pain. Clinical concern for diverticulitis. Undergoing chemotherapy for metastatic lung cancer. Recently completed radiation treatment. Current smoker. EXAM: CT ABDOMEN AND PELVIS WITH CONTRAST TECHNIQUE: Multidetector CT imaging of the abdomen and pelvis was performed using the standard protocol following bolus administration of intravenous contrast. CONTRAST:  157mL ISOVUE-300 IOPAMIDOL (ISOVUE-300) INJECTION 61% COMPARISON:  04/01/2018. Chest, abdomen and pelvis CT dated 04/28/2018. FINDINGS: Lower chest: Oval area of low density within the apical portion of the left ventricle. This measures 1.9 x 1.2 cm on image number 9 series 2 and 1.9 cm in length on sagittal image number 93. This was not previously present. Mild bibasilar atelectasis/scarring. Mild diffuse peribronchial thickening  and bullous changes. Hepatobiliary: The previously demonstrated 3.8 x 2.7 cm mass in segment 8 of the liver on the right currently measures 1.6 x 1.3 cm in maximum diameter on image number 9 series 2. The previously demonstrated 3.4 x 2.8 cm oval mass in segment 4B/5, currently measures 2.1 cm in maximum diameter on image number 24 series 2. The previously demonstrated 1.5 x 1.5 cm mass in the central aspect of segment 4A is not visualized today. Also demonstrated is interval visualization of a 6 mm probable cyst in the posterior segment of the right lobe of the liver on image number 16 series 2. Unremarkable gallbladder. Pancreas: Diffuse pancreatic atrophy, most pronounced involving the head and neck.  Spleen: Normal in size without focal abnormality. Adrenals/Urinary Tract: Normal appearing adrenal glands. Small right renal cysts. Small left renal vascular calcification. Mild diffuse wall thickening and enhancement of the right ureter and right renal collecting system with mild dilatation. Normal appearing left ureter. Gas throughout the walls of the urinary bladder with nondependent air or gas in the bladder. Stomach/Bowel: Large amount of stool throughout the colon. Mild sigmoid colon diverticulosis without evidence of diverticulitis. Unremarkable stomach and small bowel. No evidence of appendicitis. Vascular/Lymphatic: Atheromatous arterial calcifications without aneurysm. No enlarged lymph nodes. Reproductive: Uterus and bilateral adnexa are unremarkable. Other: Small left inguinal hernia containing fat. Musculoskeletal: Bilateral sacral ala fractures are again demonstrated with interval diffuse patchy bone sclerosis and nonunion. Also again demonstrated are right inferior pubic ramus and ischial/anterior acetabular fractures. Mild increase in associated callus formation with nonunion. Interval visualization of a mildly comminuted right superior pubic ramus fracture with patchy bone sclerosis. Moderate right hip degenerative changes. Lumbar and lower thoracic spine degenerative changes with progressive patchy sclerosis involving multiple lumbar and lower thoracic vertebral bodies. IMPRESSION: 1. Interval emphysematous cystitis. 2. Interval visualization of a 1.9 x 1.2 x 1.9 cm apical left ventricular clot. This places the patient at increased risk for embolic stroke or other vascular occlusions. 3. Interval mild right hydronephrosis and hydroureter with mild diffuse uroepithelial thickening and enhancement, compatible with infection. 4. Interval decrease in size of the previously demonstrated liver metastases. 5. Progressive sclerotic bony metastatic disease with nonunion of multiple previous fractures as  described above as well as nonunion of an interval comminuted superior pubic ramus fracture on the right. 6. Mild changes of COPD and chronic bronchitis. 7. Large amount of stool throughout the colon. 8. Mild sigmoid diverticulosis without evidence of diverticulitis. These results were called by telephone at the time of interpretation on 05/30/2018 at 8:11 pm to Dr. Lacretia Leigh , who verbally acknowledged these results. Electronically Signed   By: Claudie Revering M.D.   On: 05/30/2018 20:23   IMPRESSION:  *GI bleed:  Described as "spotting bright red blood from her rectum" per the note from oncology office yesterday.  Patient unaware of any bleeding prior to that.  She has not experienced any further bleeding at this point per her nurse.  Had colonoscopy in 2016 that was unremarkable except for diverticular disease.  Differential includes diverticular bleed vs hemorrhoids.  CT scan shows a large amount of stool throughout the colon.  Constipation possibly contributing.   *Acute on chronic anemia:  Hgb down about 1.5-2 grams, but some of it is likely dilutional.   *Left Apical Ventricular clot:  This is new and will require anticoagulation.  Cardiology seeing the patient. *Small cell lung cancer with metastasis to the liver and bone with associated spinal cord compression. *Generalized abdominal  pain:  Has large amount of stool throughout the colon, which could be contributing.  PLAN: *Suspect that we will plan for observation for now as she has not experienced any further bleeding.  If bleeds briskly then will likely need a NM bleeding scan vs CT angio. *Monitor Hgb and transfuse prn. *In regards to anticoagulation, when this is started then may want to start with IV heparin to "challenge" for any further bleeding issue. *Needs bowel regimen.  Is on Miralax daily right now.  I am going to increase to BID for now.  **Will discuss with Dr. Loletha Carrow and all final plans will be per his  recommendation.   Laban Emperor. Zehr  05/31/2018, 8:58 AM   I have reviewed the entire case in detail with the above APP and discussed the plan in detail.  Therefore, I agree with the diagnoses recorded above. In addition,  I have personally interviewed and examined the patient and have personally reviewed any abdominal/pelvic CT scan images.  My additional thoughts are as follows:  A single episode of small-volume rectal bleeding yesterday in the setting of chronic constipation in a patient with advanced metastatic cancer on chronic opiates for pain control.  I do not think this is diverticular bleeding.  It is probably benign anal rectal mucosal bleeding or internal hemorrhoidal bleeding.  It is not ongoing.  I am not currently planning a sigmoidoscopy or colonoscopy.  She has lower abdominal tenderness on exam that I believe can be attributed to the emphysematous cystitis seen on CT scan.  Review of scan images also reveals a large amount of retained stool throughout the colon.  I believe this led to the rectal bleeding episode yesterday.  I do not feel this rectal bleeding is a contraindication to anticoagulation for the cardiac thrombus, if that is deemed necessary by her cardiology and primary team.  It appears she is on an aggressive bowel regimen now, and her once daily MiraLAX dose was increased to twice daily today.  She can have a regular diet in my opinion and I am not planning further diagnostic testing at this point.  We will sign off and see her as the need arises.   Nelida Meuse III Office:[phone]

## 2018-05-31 NOTE — Progress Notes (Signed)
OT Cancellation Note  Patient Details Name: [name] [name] MRN: [id number] DOB: [birth year]   Cancelled Treatment:    Reason Eval/Treat Not Completed: Other (comment)Pt was working with PT then eating lunch. Will check back on pt as schedule allows  Kari Baars, [place] [phone] Office- [phone], Thereasa Parkin 05/31/2018, 1:27 PM

## 2018-05-31 NOTE — Progress Notes (Signed)
Pharmacy Antibiotic Note  [name] [name] is a 77 y.o. female admitted on 05/30/2018 with GI bleeding, LA thrombus in setting of metastatic Lung CA.  She is starting on antibiotics for emphysematous cystitis.  Only "symptom" is suprapubic tenderness on deep palpation that could be related to cystitis vs superior pubic ramus fracture.  Urinary catheter in place.  She has a hx of ESBL-producing Ecoli in urine.  Pharmacy has been consulted for Meropenem dosing.  05/31/2018:  WBC, LA elevated  Renal function at patient's baseline  Afebrile  Plan: Meropenem 1gm IV q8h Monitor renal function and cx data    Height: 5\' 5"  (165.1 cm) Weight: 146 lb (66.2 kg) IBW/kg (Calculated) : 57  Temp (24hrs), Avg:97.7 F (36.5 C), Min:97.4 F (36.3 C), Max:98.1 F (36.7 C)  Recent Labs  Lab 05/28/18 1016 05/30/18 1724 05/30/18 1841 05/30/18 2051 05/31/18 0651  WBC 22.2* 25.5*  --   --  15.1*  CREATININE 0.70 0.93  --   --  0.57  LATICACIDVEN  --   --  1.87 2.35*  --     Estimated Creatinine Clearance: 51.3 mL/min (by C-G formula based on SCr of 0.57 mg/dL).    Allergies  Allergen Reactions  . Atorvastatin     Intolerant to all statins   . Cyclobenzaprine Other (See Comments)    Bradycardia, hypotension  . Ezetimibe      INTOL to Zetia  . Rosuvastatin     intolerant to all statins  . Gadolinium Derivatives Nausea Only    Pt became very nauseous after gadolinium administration//lh  . Nicoderm [Nicotine] Rash    Unable to use the patches---caused severe rash to area placed    Antimicrobials this admission: 12/13 Meropenem >>   Dose adjustments this admission:  Microbiology results: 12/12 UCx:    Previous cx data 04/01/18 UCx: ESBL Ecoli   Thank you for allowing pharmacy to be a part of this patient's care.  Biagio Borg 05/31/2018 4:42 PM

## 2018-05-31 NOTE — Progress Notes (Signed)
  Echocardiogram 2D Echocardiogram has been performed.  [name] [name] 05/31/2018, 4:44 PM

## 2018-06-01 ENCOUNTER — Inpatient Hospital Stay: Payer: Medicare Other

## 2018-06-01 LAB — CBC WITH DIFFERENTIAL/PLATELET
Abs Immature Granulocytes: 1.01 10*3/uL — ABNORMAL HIGH (ref 0.00–0.07)
Basophils Absolute: 0 10*3/uL (ref 0.0–0.1)
Basophils Relative: 0 %
EOS ABS: 0 10*3/uL (ref 0.0–0.5)
Eosinophils Relative: 0 %
HCT: 25.9 % — ABNORMAL LOW (ref 36.0–46.0)
Hemoglobin: 8.2 g/dL — ABNORMAL LOW (ref 12.0–15.0)
Immature Granulocytes: 8 %
Lymphocytes Relative: 4 %
Lymphs Abs: 0.5 10*3/uL — ABNORMAL LOW (ref 0.7–4.0)
MCH: 28.1 pg (ref 26.0–34.0)
MCHC: 31.7 g/dL (ref 30.0–36.0)
MCV: 88.7 fL (ref 80.0–100.0)
Monocytes Absolute: 0.1 10*3/uL (ref 0.1–1.0)
Monocytes Relative: 0 %
Neutro Abs: 10.6 10*3/uL — ABNORMAL HIGH (ref 1.7–7.7)
Neutrophils Relative %: 88 %
Platelets: 431 10*3/uL — ABNORMAL HIGH (ref 150–400)
RBC: 2.92 MIL/uL — ABNORMAL LOW (ref 3.87–5.11)
RDW: 17.2 % — ABNORMAL HIGH (ref 11.5–15.5)
WBC: 12.2 10*3/uL — ABNORMAL HIGH (ref 4.0–10.5)
nRBC: 0 % (ref 0.0–0.2)

## 2018-06-01 LAB — BASIC METABOLIC PANEL
Anion gap: 5 (ref 5–15)
BUN: 22 mg/dL (ref 8–23)
CO2: 27 mmol/L (ref 22–32)
CREATININE: 0.7 mg/dL (ref 0.44–1.00)
Calcium: 7.5 mg/dL — ABNORMAL LOW (ref 8.9–10.3)
Chloride: 102 mmol/L (ref 98–111)
GFR calc Af Amer: >60 mL/min (ref >60)
GFR calc non Af Amer: >60 mL/min (ref >60)
Glucose, Bld: 102 mg/dL — ABNORMAL HIGH (ref 70–99)
Potassium: 4.3 mmol/L (ref 3.5–5.1)
Sodium: 134 mmol/L — ABNORMAL LOW (ref 135–145)

## 2018-06-01 LAB — LACTIC ACID, PLASMA: Lactic Acid, Venous: 1.5 mmol/L (ref 0.5–1.9)

## 2018-06-01 LAB — MAGNESIUM: Magnesium: 2.5 mg/dL — ABNORMAL HIGH (ref 1.7–2.4)

## 2018-06-01 MED ORDER — BISACODYL 10 MG RE SUPP
10.0000 mg | Freq: Every day | RECTAL | Status: AC
  Administered 2018-06-01 – 2018-06-02 (×2): 10 mg via RECTAL
  Filled 2018-06-01 (×2): qty 1

## 2018-06-01 MED ORDER — ALUM & MAG HYDROXIDE-SIMETH 200-200-20 MG/5ML PO SUSP
30.0000 mL | Freq: Once | ORAL | Status: AC
  Administered 2018-06-01: 30 mL via ORAL
  Filled 2018-06-01: qty 30

## 2018-06-01 NOTE — Progress Notes (Signed)
PROGRESS NOTE  [name] [name] HYW:[id number] DOB: May 02, 1941 DOA: 05/30/2018 PCP: Marin Olp, MD  HPI/Recap of past 24 hours:   C/o bad heart burn, has been burping for two weeks,She has poor oral intake,   bp borderline low, does not appear symptomatic,   She is on chronic decadrone  Have not had bm,   She is very weak, need 2 person assist to get out of bed, not able to stand up on herself No fever,  Husband at bedside   Assessment/Plan: Principal Problem:   LV (left ventricular) mural thrombus without MI Active Problems:   Hypothyroidism   Spinal cord compression due to malignant neoplasm metastatic to spine ([place])   Small cell carcinoma of lower lobe of left lung ([facility])   BRBPR (bright red blood per rectum)  GI bleed with BRBPR  -She is sent from oncology clinic to admission due to GI bleed and lethargy -No active bleed since in the hospital, though hemoglobin dropped from 10  On admission to 8, has been 8.2x2 days -GI consulted though bleed from diverticular or hemorrhoids and signed off .    left ventricular thrombus -CT ab report "visualization of a 1.9 x 1.2 x 1.9 cm apical left ventricular clot. This places the patient at increased risk for embolic stroke or other vascular occlusions" -Cardiology consulted, patient is hesitating to start on anticoagulation,  high risk of stroke if not to start on anticoagulation ,will follow up on cardiology recommendation  -Palliative care consulted for goals of care discussion  Bilateral ankle pitting edema -We will get venous ultrasound to rule out DVT,  -she has been on Decadron since radiation, Decadron could cause edema as well.   -she also have diastolic dysfunction on previous echocardiogram, blood pressure borderline low prohibit Lasix use ,  -Elevate legs  Borderline low blood pressure -We will check a.m. cortisol level  Emphysematous cystitis, mild right hydronephrosis and hydroureter with mild diffuse  uroepithelial thickening and enhancement, compatible with infection. identified on CT abdomen  On 12/12 -UA with few bacteria greater than 50 WBC, large leukocyte, urine culture in process, he does has some suprapubic tenderness on deep palpation , but difficult to decide whether this is related to cystitis or her superior pubic ramus fracture -Patient is immunosuppressed, case discussed with infectious disease who recommended treat with antibiotic for 10 to 14 days -Start meropenem for ESBL according to past urine culture in October 2019 -Urine culture now growing  E. coli   Extensive stage small cell lung cancer,Widespread metastatic disease to the bones and liver  widespread metastatic disease involving numerous thoracic vertebrae including T1, T2, T5, T6, T7, T9, T11, and T12. Pathologic fracture T9. Bulky dorsal epidural tumor at T6 results in significant cord compression.   Per previous neurosurgery recommendation " biopsy shows small cell, no role for surgery"  She completed palliative radiotherapy for T6 cord compression in 04/2018.  She is continued on Decadron 4 mg daily.   The patient is status post cycle 2 days 1 and 2 of carboplatin and etoposide. Day 3 on 12/12 was held due to gi bleed and lethargy, she is sent to the ED and hospitalized on the same day Scheduled for PEG filgrastim on 06/01/2018, will need Granix if kept as inpatient per oncology note  CT ab obtained in the ED on 12/12 showed:   Musculoskeletal: Bilateral sacral ala fractures are again demonstrated with interval diffuse patchy bone sclerosis and Nonunion.  Also again demonstrated are right  inferior pubic ramus and ischial/anterior acetabular fractures. Mild increase in associated callus formation with nonunion.  Interval visualization of a mildly comminuted right superior pubic ramus fracture with patchy bone sclerosis.  Moderate right hip degenerative changes. Lumbar and lower thoracic spine degenerative  changes with progressive patchy sclerosis involving multiple lumbar and lower thoracic vertebral bodies.  Indigestion -Patient reports indigestion since started on radiation therapy -Continue PPI  Chronic cancer pain -Continue home meds including fentanyl patch,   COPD /she continues to smoke -No wheezing, no hypoxia at rest  Hypothyroidism, continue Synthroid supplement  Constipation, stool softener   FTT: Very deconditioned and weak, need 2 person assist to get out of bed, not able to stand up by herself   prognosis guarded, patient is at high risk of decompensation due to multiple significant medical illness as described above.      Code Status: full ,confirmed with the patient with husband at bedside  Family Communication: patient and husband  Disposition Plan: not ready to discharge, at risk of decompensation Needs cardiology clearance  Consultants:  [facility], signed off  Cardiology  Oncology Dr Julien Nordmann   Infectious disease Dr Baxter Flattery over the phone  Procedures:  none  Antibiotics:  Meropenem    Objective: BP 95/61 (BP Location: Left Arm)   Pulse 77   Temp 98.7 F (37.1 C) (Oral)   Resp 18   Ht 5\' 5"  (1.651 m)   Wt 65.2 kg   SpO2 93%   BMI 23.93 kg/m   Intake/Output Summary (Last 24 hours) at 06/01/2018 0743 Last data filed at 06/01/2018 0600 Gross per 24 hour  Intake 320 ml  Output 1050 ml  Net -730 ml   Filed Weights   05/30/18 2331 05/31/18 0540 06/01/18 0456  Weight: 65.9 kg 66.2 kg 65.2 kg    Exam: Patient is examined daily including today on 06/01/2018, exams remain the same as of yesterday except that has changed    General:  Very frail and pale, hard of hearing  Cardiovascular: RRR  Respiratory: diminished at basis , no wheezing  Abdomen: she does has mild tender on deep palpation around suprapubic region and left lower quadrant of her abdomen, no guarding, no rebound, Soft/ND, positive BS  Musculoskeletal: bilateral ankle  pitting Edema  Neuro: alert, oriented x3, slight confusion, able to self correct  Data Reviewed: Basic Metabolic Panel: Recent Labs  Lab 05/28/18 1016 05/30/18 1724 05/31/18 0651 06/01/18 0433  NA 136 133* 135 134*  K 4.3 4.0 4.1 4.3  CL 101 100 102 102  CO2 23 22 24 27   GLUCOSE 152* 96 100* 102*  BUN 20 30* 22 22  CREATININE 0.70 0.93 0.57 0.70  CALCIUM 8.5* 8.2* 7.5* 7.5*  MG  --   --   --  2.5*   Liver Function Tests: Recent Labs  Lab 05/28/18 1016  AST 9*  ALT 11  ALKPHOS 181*  BILITOT 0.3  PROT 5.8*  ALBUMIN 2.3*   No results for input(s): LIPASE, AMYLASE in the last 168 hours. No results for input(s): AMMONIA in the last 168 hours. CBC: Recent Labs  Lab 05/28/18 1016 05/30/18 1724 05/31/18 0651 06/01/18 0433  WBC 22.2* 25.5* 15.1* 12.2*  NEUTROABS 19.3*  --   --  10.6*  HGB 9.9* 10.2* 8.2* 8.2*  HCT 31.3* 32.6* 26.1* 25.9*  MCV 86.5 88.6 88.5 88.7  PLT 479* 559* 461* 431*   Cardiac Enzymes:   No results for input(s): CKTOTAL, CKMB, CKMBINDEX, TROPONINI in the last 168  hours. BNP (last 3 results) Recent Labs    04/28/18 0549  BNP 640.3*    ProBNP (last 3 results) No results for input(s): PROBNP in the last 8760 hours.  CBG: No results for input(s): GLUCAP in the last 168 hours.  No results found for this or any previous visit (from the past 240 hour(s)).   Studies: No results found.  Scheduled Meds: . dexamethasone  4 mg Oral Daily  . feeding supplement (ENSURE ENLIVE)  237 mL Oral BID BM  . fentaNYL  25 mcg Transdermal Q72H  . levothyroxine  75 mcg Oral QAC breakfast  . nystatin  5 mL Oral QID  . pantoprazole  40 mg Oral Daily  . polyethylene glycol  17 g Oral BID  . senna  1 tablet Oral BID  . sodium chloride flush  3 mL Intravenous Q12H    Continuous Infusions: . meropenem (MERREM) IV Stopped (06/01/18 0248)     Time spent: 53mins, I have personally reviewed and interpreted on  06/01/2018 daily labs, tele strips, imagings  as discussed above under date review session and assessment and plans.  I reviewed all nursing notes, pharmacy notes, consultant notes,  vitals, pertinent old records  I have discussed plan of care as described above with RN , patient and family on 06/01/2018   Florencia Reasons MD, PhD  [facility] Pager [phone]. If 7PM-7AM, please contact night-coverage at [email or web address], password [facility] 06/01/2018, 7:43 AM  LOS: 2 days

## 2018-06-01 NOTE — Progress Notes (Signed)
MD Erlinda Hong paged regarding pt's BP 83/55, HR 86. Pt asymptomatic, states she "fine." Will continue to monitor and follow through with any orders.

## 2018-06-01 NOTE — Progress Notes (Signed)
Progress Note  Patient Name: [name] [name] Date of Encounter: 06/01/2018  Primary Cardiologist: Dr. Darlina Guys  Subjective   Week and deconditioned, unable to stand by herself.  Inpatient Medications    Scheduled Meds: . dexamethasone  4 mg Oral Daily  . feeding supplement (ENSURE ENLIVE)  237 mL Oral BID BM  . fentaNYL  25 mcg Transdermal Q72H  . levothyroxine  75 mcg Oral QAC breakfast  . nystatin  5 mL Oral QID  . pantoprazole  40 mg Oral Daily  . polyethylene glycol  17 g Oral BID  . senna  1 tablet Oral BID  . sodium chloride flush  3 mL Intravenous Q12H   Continuous Infusions: . meropenem (MERREM) IV 1 g (06/01/18 0919)   PRN Meds: acetaminophen **OR** acetaminophen, alum & mag hydroxide-simeth, lidocaine, ondansetron **OR** ondansetron (ZOFRAN) IV, prochlorperazine, sodium chloride flush   Vital Signs    Vitals:   05/31/18 1442 05/31/18 2022 06/01/18 0455 06/01/18 0456  BP: 116/78 104/62 95/61   Pulse:  67 77   Resp:  16 18   Temp:  98.3 F (36.8 C) 98.7 F (37.1 C)   TempSrc:  Oral Oral   SpO2:  94% 93%   Weight:    65.2 kg  Height:        Intake/Output Summary (Last 24 hours) at 06/01/2018 0949 Last data filed at 06/01/2018 0600 Gross per 24 hour  Intake 200 ml  Output 1050 ml  Net -850 ml   Filed Weights   05/30/18 2331 05/31/18 0540 06/01/18 0456  Weight: 65.9 kg 66.2 kg 65.2 kg    ECG    Tracing from 05/30/2018 shows normal sinus rhythm. Personally reviewed.  Physical Exam   GEN:  Frail appearing elderly woman.  No acute distress.   Neck: No JVD. Cardiac: RRR, no gallop.  Respiratory: Nonlabored.  Decreased breath sounds. GI: Mildly nontender, bowel sounds present. MS:  Ankle edema; No deformity. Neuro:  Nonfocal. Psych: Alert and oriented x 3. Normal affect.  Hearing loss evident.  Labs    Chemistry Recent Labs  Lab 05/28/18 1016 05/30/18 1724 05/31/18 0651 06/01/18 0433  NA 136 133* 135 134*  K 4.3 4.0 4.1 4.3   CL 101 100 102 102  CO2 23 22 24 27   GLUCOSE 152* 96 100* 102*  BUN 20 30* 22 22  CREATININE 0.70 0.93 0.57 0.70  CALCIUM 8.5* 8.2* 7.5* 7.5*  PROT 5.8*  --   --   --   ALBUMIN 2.3*  --   --   --   AST 9*  --   --   --   ALT 11  --   --   --   ALKPHOS 181*  --   --   --   BILITOT 0.3  --   --   --   GFRNONAA >60 58* >60 >60  GFRAA >60 >60 >60 >60  ANIONGAP 12 11 9 5      Hematology Recent Labs  Lab 05/30/18 1724 05/31/18 0651 06/01/18 0433  WBC 25.5* 15.1* 12.2*  RBC 3.68* 2.95* 2.92*  HGB 10.2* 8.2* 8.2*  HCT 32.6* 26.1* 25.9*  MCV 88.6 88.5 88.7  MCH 27.7 27.8 28.1  MCHC 31.3 31.4 31.7  RDW 17.3* 17.2* 17.2*  PLT 559* 461* 431*     Radiology    X-ray Chest Pa And Lateral  Result Date: 05/30/2018 CLINICAL DATA:  77 y/o F; history of lung cancer with metastasis. Completed radiation treatment.  Currently receiving chemotherapy. Productive cough. EXAM: CHEST - 2 VIEW COMPARISON:  04/27/2018 chest radiograph. 04/28/2018 CT chest. FINDINGS: Stable normal cardiac silhouette given projection and technique. Right PICC line tip projects over lower SVC. Reticular and patchy opacities greatest in the left lung base. Mass in the left mid lung zone is less evident when compared with prior chest radiograph. No acute osseous abnormality is evident. IMPRESSION: 1. Reticular and patchy opacities greatest in the left lung base probably represents interstitial pulmonary edema. Pneumonia is possible. 2. Left mid lung zone mass is less evident when compared with prior chest radiograph. Electronically Signed   By: Kristine Garbe M.D.   On: 05/30/2018 22:41   Ct Abdomen Pelvis W Contrast  Result Date: 05/30/2018 CLINICAL DATA:  Abdominal pain. Clinical concern for diverticulitis. Undergoing chemotherapy for metastatic lung cancer. Recently completed radiation treatment. Current smoker. EXAM: CT ABDOMEN AND PELVIS WITH CONTRAST TECHNIQUE: Multidetector CT imaging of the abdomen and  pelvis was performed using the standard protocol following bolus administration of intravenous contrast. CONTRAST:  145mL ISOVUE-300 IOPAMIDOL (ISOVUE-300) INJECTION 61% COMPARISON:  04/01/2018. Chest, abdomen and pelvis CT dated 04/28/2018. FINDINGS: Lower chest: Oval area of low density within the apical portion of the left ventricle. This measures 1.9 x 1.2 cm on image number 9 series 2 and 1.9 cm in length on sagittal image number 93. This was not previously present. Mild bibasilar atelectasis/scarring. Mild diffuse peribronchial thickening and bullous changes. Hepatobiliary: The previously demonstrated 3.8 x 2.7 cm mass in segment 8 of the liver on the right currently measures 1.6 x 1.3 cm in maximum diameter on image number 9 series 2. The previously demonstrated 3.4 x 2.8 cm oval mass in segment 4B/5, currently measures 2.1 cm in maximum diameter on image number 24 series 2. The previously demonstrated 1.5 x 1.5 cm mass in the central aspect of segment 4A is not visualized today. Also demonstrated is interval visualization of a 6 mm probable cyst in the posterior segment of the right lobe of the liver on image number 16 series 2. Unremarkable gallbladder. Pancreas: Diffuse pancreatic atrophy, most pronounced involving the head and neck. Spleen: Normal in size without focal abnormality. Adrenals/Urinary Tract: Normal appearing adrenal glands. Small right renal cysts. Small left renal vascular calcification. Mild diffuse wall thickening and enhancement of the right ureter and right renal collecting system with mild dilatation. Normal appearing left ureter. Gas throughout the walls of the urinary bladder with nondependent air or gas in the bladder. Stomach/Bowel: Large amount of stool throughout the colon. Mild sigmoid colon diverticulosis without evidence of diverticulitis. Unremarkable stomach and small bowel. No evidence of appendicitis. Vascular/Lymphatic: Atheromatous arterial calcifications without  aneurysm. No enlarged lymph nodes. Reproductive: Uterus and bilateral adnexa are unremarkable. Other: Small left inguinal hernia containing fat. Musculoskeletal: Bilateral sacral ala fractures are again demonstrated with interval diffuse patchy bone sclerosis and nonunion. Also again demonstrated are right inferior pubic ramus and ischial/anterior acetabular fractures. Mild increase in associated callus formation with nonunion. Interval visualization of a mildly comminuted right superior pubic ramus fracture with patchy bone sclerosis. Moderate right hip degenerative changes. Lumbar and lower thoracic spine degenerative changes with progressive patchy sclerosis involving multiple lumbar and lower thoracic vertebral bodies. IMPRESSION: 1. Interval emphysematous cystitis. 2. Interval visualization of a 1.9 x 1.2 x 1.9 cm apical left ventricular clot. This places the patient at increased risk for embolic stroke or other vascular occlusions. 3. Interval mild right hydronephrosis and hydroureter with mild diffuse uroepithelial thickening and enhancement, compatible  with infection. 4. Interval decrease in size of the previously demonstrated liver metastases. 5. Progressive sclerotic bony metastatic disease with nonunion of multiple previous fractures as described above as well as nonunion of an interval comminuted superior pubic ramus fracture on the right. 6. Mild changes of COPD and chronic bronchitis. 7. Large amount of stool throughout the colon. 8. Mild sigmoid diverticulosis without evidence of diverticulitis. These results were called by telephone at the time of interpretation on 05/30/2018 at 8:11 pm to Dr. Lacretia Leigh , who verbally acknowledged these results. Electronically Signed   By: Claudie Revering M.D.   On: 05/30/2018 20:23    Cardiac Studies   Echocardiogram 05/31/2018: Study Conclusions  - Left ventricle: The cavity size was normal. Systolic function was   mildly reduced. The estimated ejection  fraction was in the range   of 45% to 50%. Hypokinesis of the apical myocardium. Features are   consistent with a pseudonormal left ventricular filling pattern,   with concomitant abnormal relaxation and increased filling   pressure (grade 2 diastolic dysfunction). There was a thrombus. - Regional wall motion abnormality: Hypokinesis of the mid   anteroseptal, mid inferoseptal, apical inferior, apical septal,   and apical myocardium. - Mitral valve: Calcified annulus. Mildly thickened leaflets . Late   systolic prolapse, involving the anterior leaflet. There was   moderate late systolic regurgitation due to prolapse. - Left atrium: The atrium was mildly dilated.  Impressions:  - Echo contrast (definity) used to evaluate for LV thrombus.   The only image suggestive of thrombus is image 68 (screenshot   saved with my annotation with arrow). There is a dense structure   that comes in and out of view at approximately 10-11 o&'clock on   that image. This could align with the mass seen on CT imaging, as   the shape/size are similar. Favor LV thrombus.  Patient Profile     77 y.o. female with a history of extensive small cell lung cancer with metastasis to bones and liver, also epidural tumor with cord compression at T6, deconditioning, hypothyroidism, COPD, chronic pain, anemia secondary to GI bleed, and recently documented LV mural thrombus.  Assessment & Plan    1.  LV mural thrombus noted apically, seen on CT imaging and also Definity contrast echocardiogram from yesterday.  LVEF 45 to 50% with anteroseptal and apical hypokinesis.  Duration is not entirely clear, although this was not specifically mentioned on the chest CT report from November.  She has underlying coronary artery calcifications in multivessel distribution consistent with CAD and ischemic cardiomyopathy, although an aggressive work-up is not planned.  2.  Anemia with GI bleed.  Hemoglobin 8.2.  Suspected to be rectal  bleeding per GI consultation with Dr. Loletha Carrow.  Endoscopic evaluation is not planned.  3.  Extensive small cell lung cancer with metastasis to bones and liver, also epidural tumor with cord compression at T6.  I reviewed the chart including cardiology consultation from yesterday, spoke with the patient and her husband this morning.  General recommendation with LV mural thrombus present would be to initiate Coumadin to reduce risk of embolization/stroke.  DOACS are not specifically indicated for treatment of LV mural thrombus.  This is obviously a very complicated situation and I would suggest weighing decision to start anticoagulation with her overall prognosis with extensive metastatic lung cancer.  Dr. Loletha Carrow did not specifically indicate a contraindication to start anticoagulation based on GI consultation from yesterday.  The patient herself was hesitant to consider "  blood thinners" however.  I asked her to consider this further prior to making a final decision.  I think it would be most useful if her regular oncologist had a frank discussion with her and help to come to a conclusion in this matter.  Signed, Rozann Lesches, MD  06/01/2018, 9:49 AM

## 2018-06-01 NOTE — Progress Notes (Signed)
CHART NOTE I reviewed the patient chart today.  She is a very pleasant 77 years old white female who was recently diagnosed with extensive stage small cell lung cancer and currently undergoing systemic chemotherapy with carboplatin and etoposide status post 1 cycle and she started the second cycle few days ago.  She was found to have bloody stool during her treatment at the cancer center and the patient was transferred to [facility] for further evaluation.  She was seen by gastroenterology and expected to have bloody stool from either diverticular disease or hemorrhoids.  During her evaluation CT scan of the abdomen and pelvis was performed and showed definite improvement in her disease especially in the liver.  There was some increase in the sclerotic bone metastasis but that usually secondary to healing process in the bone. The scan also showed a 1.9 cm apical left ventricular clot.  This increased her risk of embolic stroke or other vascular occlusion. From the oncology point of view, I do not see any contraindication to start this patient on anticoagulation for prevention of stroke or other cardiac complication as long as gastroenterology have no objection to this option because of the recent GI bleed. Definitely the patient has a terminal condition with her lung cancer but the median survival with the current treatment is in the range of 9-12 months. I will leave the final decision regarding anticoagulation to cardiology after discussion with the patient about her risk and benefit but no contraindication from oncology standpoint. Thank you for taking good care of Ms. Martin Majestic, I will continue to follow up the patient with you on as-needed basis.

## 2018-06-02 ENCOUNTER — Inpatient Hospital Stay (HOSPITAL_COMMUNITY): Payer: Medicare Other

## 2018-06-02 DIAGNOSIS — R609 Edema, unspecified: Secondary | ICD-10-CM

## 2018-06-02 LAB — URINE CULTURE: Culture: >=100000 — AB

## 2018-06-02 LAB — CBC WITH DIFFERENTIAL/PLATELET
Abs Immature Granulocytes: 1.07 10*3/uL — ABNORMAL HIGH (ref 0.00–0.07)
Basophils Absolute: 0 10*3/uL (ref 0.0–0.1)
Basophils Relative: 0 %
Eosinophils Absolute: 0 10*3/uL (ref 0.0–0.5)
Eosinophils Relative: 0 %
HCT: 24.9 % — ABNORMAL LOW (ref 36.0–46.0)
Hemoglobin: 7.7 g/dL — ABNORMAL LOW (ref 12.0–15.0)
Immature Granulocytes: 21 %
LYMPHS PCT: 11 %
Lymphs Abs: 0.5 10*3/uL — ABNORMAL LOW (ref 0.7–4.0)
MCH: 27.9 pg (ref 26.0–34.0)
MCHC: 30.9 g/dL (ref 30.0–36.0)
MCV: 90.2 fL (ref 80.0–100.0)
Monocytes Absolute: 0.1 10*3/uL (ref 0.1–1.0)
Monocytes Relative: 1 %
Neutro Abs: 3.4 10*3/uL (ref 1.7–7.7)
Neutrophils Relative %: 67 %
Platelets: 383 10*3/uL (ref 150–400)
RBC: 2.76 MIL/uL — ABNORMAL LOW (ref 3.87–5.11)
RDW: 17.2 % — ABNORMAL HIGH (ref 11.5–15.5)
WBC: 5.1 10*3/uL (ref 4.0–10.5)
nRBC: 0 % (ref 0.0–0.2)

## 2018-06-02 LAB — BASIC METABOLIC PANEL
ANION GAP: 8 (ref 5–15)
BUN: 26 mg/dL — ABNORMAL HIGH (ref 8–23)
CO2: 26 mmol/L (ref 22–32)
Calcium: 7.9 mg/dL — ABNORMAL LOW (ref 8.9–10.3)
Chloride: 101 mmol/L (ref 98–111)
Creatinine, Ser: 0.51 mg/dL (ref 0.44–1.00)
GFR calc Af Amer: >60 mL/min (ref >60)
GFR calc non Af Amer: >60 mL/min (ref >60)
Glucose, Bld: 89 mg/dL (ref 70–99)
Potassium: 4.3 mmol/L (ref 3.5–5.1)
SODIUM: 135 mmol/L (ref 135–145)

## 2018-06-02 LAB — CORTISOL: Cortisol, Plasma: 0.8 ug/dL

## 2018-06-02 LAB — MAGNESIUM: Magnesium: 2.6 mg/dL — ABNORMAL HIGH (ref 1.7–2.4)

## 2018-06-02 MED ORDER — MAGIC MOUTHWASH W/LIDOCAINE
5.0000 mL | Freq: Three times a day (TID) | ORAL | Status: DC | PRN
  Administered 2018-06-02 – 2018-06-06 (×10): 5 mL via ORAL
  Filled 2018-06-02 (×11): qty 5

## 2018-06-02 NOTE — Evaluation (Signed)
Occupational Therapy Evaluation Patient Details Name: [name] [name] MRN: [id number] DOB: [birth year] Today's Date: 06/02/2018    History of Present Illness [name] [name] is a 77 y.o. female with a hx significant for small cell lung CA w/ metastases to the liver and spine with cord compression on chemo and palliative radiation, HLD, tobacco abuse, hypothyroidism and diverticulosis, who presented from oncologist office due to abdominal pain, increased lethargy and BRBPR.   Patient with an incidental finding of a 1.9 x 1.2 x 1.9 cm apical left.   Clinical Impression   Pt admitted with above diagnoses, presents with generalized weakness and decreased activity tolerance limiting ability to complete BADL at desired level of ind. Per pt report, she has been in the hospital since 10/5, went to a SNF for 4 days, then returned to the hospital. Pt presents with [place], which she heavily relies on in transfer and functional activity, generalized weakness noted elsewhere. Pt is incontinent, requiring total A for clean up from staff. Pt is supervision-mod I in rolling for clean up. Sit to Stand completed with min A +2, few steps taken to chair using RW. Pt fatigues quickly. Pt will benefit from acute OT to help maximize safety and independence in BADL activity. OT recommends SNF at d/c to continue to address BADL independence and activity tolerance.    Follow Up Recommendations  SNF    Equipment Recommendations  Other (comment)(defer to next venue)    Recommendations for Other Services       Precautions / Restrictions Precautions Precautions: Fall Precaution Comments: pubic ramus fracture in October, mets to spine  Restrictions Weight Bearing Restrictions: No      Mobility Bed Mobility Overal bed mobility: Needs Assistance Bed Mobility: Rolling;Sidelying to Sit Rolling: Supervision Sidelying to sit: Min assist   Sit to supine: Min assist   General bed mobility comments: pt  supervision/mod I in pulling self up in bed and bed mobility; min A for pulling up to sit EOB  Transfers                      Balance Overall balance assessment: Needs assistance;History of Falls Sitting-balance support: Feet supported Sitting balance-Leahy Scale: Fair Sitting balance - Comments: relies on BUE support   Standing balance support: Bilateral upper extremity supported;During functional activity Standing balance-Leahy Scale: Poor Standing balance comment: relies heavily on RW and steady assist                           ADL either performed or assessed with clinical judgement   ADL Overall ADL's : Needs assistance/impaired Eating/Feeding: Set up;Sitting   Grooming: Set up;Sitting   Upper Body Bathing: Minimal assistance;Sitting   Lower Body Bathing: Maximal assistance;Sit to/from stand Lower Body Bathing Details (indicate cue type and reason): to reach BLEs Upper Body Dressing : Set up;Sitting   Lower Body Dressing: Maximal assistance;Sit to/from stand;Sitting/lateral leans   Toilet Transfer: Minimal assistance;+2 for physical assistance;BSC;RW   Toileting- Clothing Manipulation and Hygiene: Total assistance;Bed level Toileting - Clothing Manipulation Details (indicate cue type and reason): incontinent Tub/ Shower Transfer: Moderate assistance;Shower seat   Functional mobility during ADLs: Minimal assistance;+2 for physical assistance;+2 for safety/equipment;Rolling walker General ADL Comments: pt presenting with generalized weakness and decreased activity tolerance and pain limiting functional mobility and ability to complete BADL independently. Pt is incontinent. Demonstrates WFL of BUE strength, able to pull self up in bed at mod i-  relies heavily on BUE support     Vision Baseline Vision/History: No visual deficits Patient Visual Report: No change from baseline       Perception     Praxis      Pertinent Vitals/Pain Pain Assessment:  Faces Faces Pain Scale: Hurts a little bit Pain Location: generalized with mobility; also mentions pain in rectum Pain Descriptors / Indicators: Discomfort;Aching Pain Intervention(s): Monitored during session;Repositioned;RN gave pain meds during session     Hand Dominance     Extremity/Trunk Assessment Upper Extremity Assessment Upper Extremity Assessment: Overall WFL for tasks assessed   Lower Extremity Assessment Lower Extremity Assessment: Defer to PT evaluation       Communication Communication Communication: HOH   Cognition Arousal/Alertness: Awake/alert Behavior During Therapy: WFL for tasks assessed/performed Overall Cognitive Status: Within Functional Limits for tasks assessed                                     General Comments       Exercises     Shoulder Instructions      Home Living Family/patient expects to be discharged to:: Skilled nursing facility Living Arrangements: Other (Comment)(was at SNF for 4 days, prior to return to hospital)                                      Prior Functioning/Environment Level of Independence: Needs assistance    ADL's / Homemaking Assistance Needed: pt at SNF for 4 days, but is currently incontinent, requires assist with all ADLs currently            OT Problem List: Decreased strength;Decreased activity tolerance;Decreased knowledge of use of DME or AE;Impaired balance (sitting and/or standing);Pain      OT Treatment/Interventions: Self-care/ADL training;DME and/or AE instruction;Therapeutic activities;Balance training;Therapeutic exercise;Energy conservation;Patient/family education    OT Goals(Current goals can be found in the care plan section) Acute Rehab OT Goals Patient Stated Goal: return to rehab OT Goal Formulation: With patient Time For Goal Achievement: 06/16/18 Potential to Achieve Goals: Good  OT Frequency: Min 2X/week   Barriers to D/C:             Co-evaluation              AM-PAC OT "6 Clicks" Daily Activity     Outcome Measure Help from another person eating meals?: None Help from another person taking care of personal grooming?: None Help from another person toileting, which includes using toliet, bedpan, or urinal?: Total(incontinent) Help from another person bathing (including washing, rinsing, drying)?: A Lot Help from another person to put on and taking off regular upper body clothing?: A Little Help from another person to put on and taking off regular lower body clothing?: A Lot 6 Click Score: 16   End of Session Equipment Utilized During Treatment: Gait belt;Rolling walker Nurse Communication: Mobility status  Activity Tolerance: Patient tolerated treatment well Patient left: in chair;with call bell/phone within reach;with family/visitor present  OT Visit Diagnosis: Other abnormalities of gait and mobility (R26.89);Muscle weakness (generalized) (M62.81);Pain Pain - part of body: (generalized)                Time: 2409-7353 OT Time Calculation (min): 34 min Charges:  OT General Charges $OT Visit: 1 Visit OT Evaluation $OT Eval Moderate Complexity: 1 Mod OT Treatments $Self Care/Home Management :  8-22 mins  Zenovia Jarred, MSOT, OTR/L Behavioral Health OT/ Acute Relief OT [facility] Office: [place] 06/02/2018, 11:15 AM

## 2018-06-02 NOTE — Progress Notes (Signed)
*  Preliminary Results* Bilateral lower extremity venous duplex completed. Bilateral lower extremities are negative for deep vein thrombosis. There is no evidence of Baker's cyst bilaterally.  06/02/2018 11:16 AM  Jinny Blossom Dawna Part

## 2018-06-02 NOTE — Progress Notes (Addendum)
Progress Note  Patient Name: [name] [name] Date of Encounter: 06/02/2018  Primary Cardiologist: Dr. Darlina Guys  Subjective   No chest pain or shortness of breath.  Remains weak and deconditioned.  Inpatient Medications    Scheduled Meds: . bisacodyl  10 mg Rectal Daily  . dexamethasone  4 mg Oral Daily  . feeding supplement (ENSURE ENLIVE)  237 mL Oral BID BM  . fentaNYL  25 mcg Transdermal Q72H  . levothyroxine  75 mcg Oral QAC breakfast  . nystatin  5 mL Oral QID  . pantoprazole  40 mg Oral Daily  . polyethylene glycol  17 g Oral BID  . senna  1 tablet Oral BID  . sodium chloride flush  3 mL Intravenous Q12H   Continuous Infusions: . meropenem (MERREM) IV 1 g (06/02/18 0234)   PRN Meds: acetaminophen **OR** acetaminophen, alum & mag hydroxide-simeth, lidocaine, ondansetron **OR** ondansetron (ZOFRAN) IV, prochlorperazine, sodium chloride flush   Vital Signs    Vitals:   06/01/18 0455 06/01/18 0456 06/01/18 1235 06/02/18 0527  BP: 95/61  (!) 83/55 130/80  Pulse: 77  86 66  Resp: 18  18 18   Temp: 98.7 F (37.1 C)  98.5 F (36.9 C) 98.1 F (36.7 C)  TempSrc: Oral  Oral Oral  SpO2: 93%  93% 96%  Weight:  65.2 kg    Height:        Intake/Output Summary (Last 24 hours) at 06/02/2018 0928 Last data filed at 06/01/2018 1300 Gross per 24 hour  Intake 240 ml  Output -  Net 240 ml   Filed Weights   05/30/18 2331 05/31/18 0540 06/01/18 0456  Weight: 65.9 kg 66.2 kg 65.2 kg    ECG    Tracing from 05/30/2018 shows normal sinus rhythm.  Personally reviewed.  Physical Exam   GEN:  Frail appearing elderly woman.  No acute distress.   Neck: No JVD. Cardiac: RRR, no murmur, rub, or gallop.  Respiratory: Nonlabored. Clear to auscultation bilaterally. GI:  Mildly tender, bowel sounds present. MS:  Ankle edema; No deformity. Neuro:  Nonfocal. Psych: Alert and oriented x 3. Normal affect.  Hearing loss evident.  Labs    Chemistry Recent Labs  Lab  05/28/18 1016  05/31/18 0651 06/01/18 0433 06/02/18 0513  NA 136   < > 135 134* 135  K 4.3   < > 4.1 4.3 4.3  CL 101   < > 102 102 101  CO2 23   < > 24 27 26   GLUCOSE 152*   < > 100* 102* 89  BUN 20   < > 22 22 26*  CREATININE 0.70   < > 0.57 0.70 0.51  CALCIUM 8.5*   < > 7.5* 7.5* 7.9*  PROT 5.8*  --   --   --   --   ALBUMIN 2.3*  --   --   --   --   AST 9*  --   --   --   --   ALT 11  --   --   --   --   ALKPHOS 181*  --   --   --   --   BILITOT 0.3  --   --   --   --   GFRNONAA >60   < > >60 >60 >60  GFRAA >60   < > >60 >60 >60  ANIONGAP 12   < > 9 5 8    < > = values in this interval  not displayed.     Hematology Recent Labs  Lab 05/31/18 0651 06/01/18 0433 06/02/18 0513  WBC 15.1* 12.2* 5.1  RBC 2.95* 2.92* 2.76*  HGB 8.2* 8.2* 7.7*  HCT 26.1* 25.9* 24.9*  MCV 88.5 88.7 90.2  MCH 27.8 28.1 27.9  MCHC 31.4 31.7 30.9  RDW 17.2* 17.2* 17.2*  PLT 461* 431* 383    Radiology    No results found.  Cardiac Studies   Echocardiogram 05/31/2018: Study Conclusions  - Left ventricle: The cavity size was normal. Systolic function was mildly reduced. The estimated ejection fraction was in the range of 45% to 50%. Hypokinesis of the apical myocardium. Features are consistent with a pseudonormal left ventricular filling pattern, with concomitant abnormal relaxation and increased filling pressure (grade 2 diastolic dysfunction). There was a thrombus. - Regional wall motion abnormality: Hypokinesis of the mid anteroseptal, mid inferoseptal, apical inferior, apical septal, and apical myocardium. - Mitral valve: Calcified annulus. Mildly thickened leaflets . Late systolic prolapse, involving the anterior leaflet. There was moderate late systolic regurgitation due to prolapse. - Left atrium: The atrium was mildly dilated.  Impressions:  - Echo contrast (definity) used to evaluate for LV thrombus. The only image suggestive of thrombus is image  25 (screenshot saved with my annotation with arrow). There is a dense structure that comes in and out of view at approximately 10-11 o&'clock on that image. This could align with the mass seen on CT imaging, as the shape/size are similar. Favor LV thrombus.  Patient Profile     77 y.o. female with a history of extensive small cell lung cancer with metastasis to bones and liver, also epidural tumor with cord compression at T6, deconditioning, hypothyroidism, COPD, chronic pain, anemia secondary to GI bleed, and recently documented LV mural thrombus.  Assessment & Plan    1.  Apical LV mural thrombus seen by CT imaging and also follow-up Definity contrast echocardiogram.  LVEF is 45 to 50% range with anteroseptal and apical hypokinesis.  Duration of this abnormality is not entirely clear although was not mentioned on prior chest CT from November.  She does have underlying coronary artery calcification and multivessel distribution by CT imaging and therefore could have an ischemic cardiomyopathy leading to the wall motion abnormalities.  2.  Anemia with GI bleed, hemoglobin down to 7.7 today.  Rectal bleeding suspected by Dr. Loletha Carrow with GI consultation although further endoscopic evaluation was not planned.  3.  Extensive small cell lung cancer with metastasis to bones and liver, also epidural tumor with cord compression at T6.  Based on oncology note from yesterday median survival with current treatment is in the range of 9 to 12 months.  Typical management of LV mural thrombus would be anticoagulation on Coumadin. DOACS are not specifically indicated for management of mural thrombus.  If this were a documented chronic calcified LV mural thrombus, given her current life expectancy, it is not entirely clear to me that anticoagulation would be specifically pursued.  However, since this might be new (was not seen by chest CT in November), it would not be unreasonable to start Coumadin if not  contraindicated and she agrees.  Complicating this obviously is a dropping hemoglobin, now down to 7.7.  Although the GI service has "cleared" the patient to start anticoagulation, no further endoscopic testing is planned and they have signed off.  Oncology has also mentioned no specific contraindication to starting anticoagulation.  I do wonder about her tumor spread and cord compression, whether this would  represent a relative contraindication to anticoagulation.  I have tried to explain these complicated issues to the patient as best I can, I have asked her to consider the matter further.  She is hesitant to start anticoagulation and frankly I am hard-pressed to force the issue.  I spoke with the hospitalist team as well.  Signed, Rozann Lesches, MD  06/02/2018, 9:28 AM

## 2018-06-02 NOTE — Progress Notes (Addendum)
PROGRESS NOTE  [name] [name] WJX:[id number] DOB: 1941/04/30 DOA: 05/30/2018 PCP: Marin Olp, MD  HPI/Recap of past 24 hours:   C/o bad heart burn and burping a lot when stay in bed,She has very poor oral intake,   bp borderline low, does not appear symptomatic, very low am free cortisol level  She is on chronic decadrone  Has bowel/bladder incontinence, bm is brown, no blood  She is very weak, needs at least 2 person assist to get out of bed, not able to stand up on herself She has slight intermittent confusion  No fever,  Husband at bedside   Assessment/Plan: Principal Problem:   LV (left ventricular) mural thrombus without MI Active Problems:   Hypothyroidism   Spinal cord compression due to malignant neoplasm metastatic to spine ([place])   Small cell carcinoma of lower lobe of left lung (HCC)   BRBPR (bright red blood per rectum)  GI bleed with BRBPR  -She is sent from oncology clinic to admission due to GI bleed and lethargy -No active bleed since in the hospital, though hemoglobin dropped from 10  On admission to 8, has been 8.2x2 days -GI consulted though bleed from diverticular or hemorrhoids and signed off .   -no blood in stool since in the hospital.  Acute on chronic anemia in the setting of malignancy -no sign of blood loss in the hospital, stool is brown, no visible blood. -hgb continue to decrease, possible from chemo , last chemo on 12/11. -supportive transfusion to keep hgb> 7,   monitor wbc, may need GSCF if become neutropenia  left ventricular thrombus -CT ab report "visualization of a 1.9 x 1.2 x 1.9 cm apical left ventricular clot. This places the patient at increased risk for embolic stroke or other vascular occlusions" -Cardiology consulted, patient is hesitating to start on anticoagulation,  high risk of stroke if not to start on anticoagulation ,will follow up on cardiology recommendation  -Palliative care consulted for goals of care  discussion  Bilateral ankle pitting edema -venous ultrasound negative for DVT,  -she has been on Decadron since radiation, Decadron could cause edema as well.  --she also have diastolic dysfunction on previous echocardiogram, blood pressure borderline low prohibit Lasix use ,  -Elevate legs  Borderline low blood pressure -a.m. cortisol level very low at 0.8  Emphysematous cystitis, mild right hydronephrosis and hydroureter with mild diffuse uroepithelial thickening and enhancement, compatible with infection. identified on CT abdomen  On 12/12 -UA with few bacteria greater than 50 WBC, large leukocyte, urine culture in process, he does has some suprapubic tenderness on deep palpation , but difficult to decide whether this is related to cystitis or her superior pubic ramus fracture -Patient is immunosuppressed, case discussed with infectious disease who recommended treat with antibiotic meropenem for 10 to 14 days -Start meropenem for ESBL according to past urine culture in October 2019 -Urine culture + ESBL  E. Coli, continue meropenem   Extensive stage small cell lung cancer,Widespread metastatic disease to the bones , epidural and liver  widespread metastatic disease involving numerous thoracic vertebrae including T1, T2, T5, T6, T7, T9, T11, and T12. Pathologic fracture T9. Bulky dorsal epidural tumor at T6 results in significant cord compression.   Per previous neurosurgery recommendation " biopsy shows small cell, no role for surgery"  She completed palliative radiotherapy for T6 cord compression in 04/2018.  She is continued on Decadron 4 mg daily.   The patient is status post cycle 2 days 1  and 2 of carboplatin and etoposide. Day 3 on 12/12 was held due to gi bleed and lethargy, she is sent to the ED and hospitalized on the same day Scheduled for PEG filgrastim on 06/01/2018, will need Granix if kept as inpatient per oncology note  CT ab obtained in the ED on 12/12 showed:    Musculoskeletal: Bilateral sacral ala fractures are again demonstrated with interval diffuse patchy bone sclerosis and Nonunion.  Also again demonstrated are right inferior pubic ramus and ischial/anterior acetabular fractures. Mild increase in associated callus formation with nonunion.  Interval visualization of a mildly comminuted right superior pubic ramus fracture with patchy bone sclerosis.  Moderate right hip degenerative changes. Lumbar and lower thoracic spine degenerative changes with progressive patchy sclerosis involving multiple lumbar and lower thoracic vertebral bodies.  Indigestion -Patient reports indigestion since started on radiation therapy -Continue PPI  Chronic cancer pain -Continue home meds including fentanyl patch,   COPD /she continues to smoke -No wheezing, no hypoxia at rest  Hypothyroidism, continue Synthroid supplement  Constipation, stool softener   FTT: Very deconditioned and weak, need 2 person assist to get out of bed, not able to stand up by herself   prognosis guarded, patient is at high risk of decompensation due to multiple significant medical illness as described above.      Code Status: full ,confirmed with the patient with husband at bedside  Family Communication: patient and husband  Disposition Plan: not ready to discharge, at risk of decompensation Needs cardiology clearance  Consultants:  [facility], signed off  Cardiology  Oncology Dr Julien Nordmann   Infectious disease Dr Baxter Flattery over the phone  Palliative care   Procedures:  none  Antibiotics:  Meropenem    Objective: BP 130/80 (BP Location: Left Arm)   Pulse 66   Temp 98.1 F (36.7 C) (Oral)   Resp 18   Ht 5\' 5"  (1.651 m)   Wt 65.2 kg   SpO2 96%   BMI 23.93 kg/m  No intake or output data in the 24 hours ending 06/02/18 1319 Filed Weights   05/30/18 2331 05/31/18 0540 06/01/18 0456  Weight: 65.9 kg 66.2 kg 65.2 kg    Exam: Patient is examined daily  including today on 06/02/2018, exams remain the same as of yesterday except that has changed    General:  Very frail and pale, hard of hearing  Cardiovascular: RRR  Respiratory: diminished at basis , no wheezing  Abdomen: she does has mild tender on deep palpation around suprapubic region and left lower quadrant of her abdomen, no guarding, no rebound, Soft/ND, positive BS  Musculoskeletal: bilateral ankle pitting Edema  Neuro: alert, oriented x3, slight confusion, able to self correct  Data Reviewed: Basic Metabolic Panel: Recent Labs  Lab 05/28/18 1016 05/30/18 1724 05/31/18 0651 06/01/18 0433 06/02/18 0513  NA 136 133* 135 134* 135  K 4.3 4.0 4.1 4.3 4.3  CL 101 100 102 102 101  CO2 23 22 24 27 26   GLUCOSE 152* 96 100* 102* 89  BUN 20 30* 22 22 26*  CREATININE 0.70 0.93 0.57 0.70 0.51  CALCIUM 8.5* 8.2* 7.5* 7.5* 7.9*  MG  --   --   --  2.5* 2.6*   Liver Function Tests: Recent Labs  Lab 05/28/18 1016  AST 9*  ALT 11  ALKPHOS 181*  BILITOT 0.3  PROT 5.8*  ALBUMIN 2.3*   No results for input(s): LIPASE, AMYLASE in the last 168 hours. No results for input(s): AMMONIA in the  last 168 hours. CBC: Recent Labs  Lab 05/28/18 1016 05/30/18 1724 05/31/18 0651 06/01/18 0433 06/02/18 0513  WBC 22.2* 25.5* 15.1* 12.2* 5.1  NEUTROABS 19.3*  --   --  10.6* 3.4  HGB 9.9* 10.2* 8.2* 8.2* 7.7*  HCT 31.3* 32.6* 26.1* 25.9* 24.9*  MCV 86.5 88.6 88.5 88.7 90.2  PLT 479* 559* 461* 431* 383   Cardiac Enzymes:   No results for input(s): CKTOTAL, CKMB, CKMBINDEX, TROPONINI in the last 168 hours. BNP (last 3 results) Recent Labs    04/28/18 0549  BNP 640.3*    ProBNP (last 3 results) No results for input(s): PROBNP in the last 8760 hours.  CBG: No results for input(s): GLUCAP in the last 168 hours.  Recent Results (from the past 240 hour(s))  Culture, Urine     Status: Abnormal   Collection Time: 05/30/18  5:29 PM  Result Value Ref Range Status   Specimen  Description   Final    URINE, CLEAN CATCH Performed at [facility], [place] [address] [place], [place] [address]    Special Requests   Final    NONE Performed at [facility], [place] [address], [place], [place] [address]    Culture (A)  Final    >=100,000 COLONIES/mL ESCHERICHIA COLI Confirmed Extended Spectrum Beta-Lactamase Producer (ESBL).  In bloodstream infections from ESBL organisms, carbapenems are preferred over piperacillin/tazobactam. They are shown to have a lower risk of mortality.    Report Status 06/02/2018 FINAL  Final   Organism ID, Bacteria ESCHERICHIA COLI (A)  Final      Susceptibility   Escherichia coli - MIC*    AMPICILLIN >=32 RESISTANT Resistant     CEFAZOLIN >=64 RESISTANT Resistant     CEFTRIAXONE >=64 RESISTANT Resistant     CIPROFLOXACIN 0.5 SENSITIVE Sensitive     GENTAMICIN >=16 RESISTANT Resistant     IMIPENEM <=0.25 SENSITIVE Sensitive     NITROFURANTOIN <=16 SENSITIVE Sensitive     TRIMETH/SULFA >=320 RESISTANT Resistant     AMPICILLIN/SULBACTAM 16 INTERMEDIATE Intermediate     PIP/TAZO <=4 SENSITIVE Sensitive     Extended ESBL POSITIVE Resistant     * >=100,000 COLONIES/mL ESCHERICHIA COLI     Studies: Vas [place] Lower Extremity Venous (dvt)  Result Date: 06/02/2018  Lower Venous Study Indications: Edema.  Limitations: Patient positioning. Performing Technologist: Abram Sander RVS  Examination Guidelines: A complete evaluation includes B-mode imaging, spectral Doppler, color Doppler, and power Doppler as needed of all accessible portions of each vessel. Bilateral testing is considered an integral part of a complete examination. Limited examinations for reoccurring indications may be performed as noted.  Right Venous Findings: +---------+---------------+---------+-----------+----------+--------------+          CompressibilityPhasicitySpontaneityPropertiesSummary         +---------+---------------+---------+-----------+----------+--------------+ CFV      Full           Yes      Yes                                 +---------+---------------+---------+-----------+----------+--------------+ SFJ      Full                                                        +---------+---------------+---------+-----------+----------+--------------+ FV Prox  Full                                                        +---------+---------------+---------+-----------+----------+--------------+ FV Mid   Full                                                        +---------+---------------+---------+-----------+----------+--------------+ FV DistalFull                                                        +---------+---------------+---------+-----------+----------+--------------+ PFV      Full                                                        +---------+---------------+---------+-----------+----------+--------------+ POP      Full           Yes      Yes                                 +---------+---------------+---------+-----------+----------+--------------+ PTV                                                   Not visualized +---------+---------------+---------+-----------+----------+--------------+ PERO                                                  Not visualized +---------+---------------+---------+-----------+----------+--------------+  Left Venous Findings: +---------+---------------+---------+-----------+----------+--------------+          CompressibilityPhasicitySpontaneityPropertiesSummary        +---------+---------------+---------+-----------+----------+--------------+ CFV      Full           Yes      Yes                                 +---------+---------------+---------+-----------+----------+--------------+ SFJ      Full                                                         +---------+---------------+---------+-----------+----------+--------------+ FV Prox  Full                                                        +---------+---------------+---------+-----------+----------+--------------+  FV Mid   Full                                                        +---------+---------------+---------+-----------+----------+--------------+ FV DistalFull                                                        +---------+---------------+---------+-----------+----------+--------------+ PFV      Full                                                        +---------+---------------+---------+-----------+----------+--------------+ POP      Full           Yes      Yes                                 +---------+---------------+---------+-----------+----------+--------------+ PTV                                                   Not visualized +---------+---------------+---------+-----------+----------+--------------+ PERO                                                  Not visualized +---------+---------------+---------+-----------+----------+--------------+    Summary: Right: There is no evidence of deep vein thrombosis in the lower extremity. However, portions of this examination were limited- see technologist comments above. No cystic structure found in the popliteal fossa. Left: There is no evidence of deep vein thrombosis in the lower extremity. However, portions of this examination were limited- see technologist comments above. No cystic structure found in the popliteal fossa.  *See table(s) above for measurements and observations.    Preliminary     Scheduled Meds: . dexamethasone  4 mg Oral Daily  . feeding supplement (ENSURE ENLIVE)  237 mL Oral BID BM  . fentaNYL  25 mcg Transdermal Q72H  . levothyroxine  75 mcg Oral QAC breakfast  . nystatin  5 mL Oral QID  . pantoprazole  40 mg Oral Daily  . polyethylene glycol  17 g Oral BID  .  senna  1 tablet Oral BID  . sodium chloride flush  3 mL Intravenous Q12H    Continuous Infusions: . meropenem (MERREM) IV 1 g (06/02/18 1040)     Time spent: 29mins, case discussed with cardiology  I have personally reviewed and interpreted on  06/02/2018 daily labs, tele strips, imagings as discussed above under date review session and assessment and plans.  I reviewed all nursing notes, pharmacy notes, consultant notes,  vitals, pertinent old records  I have discussed plan of care as described above with RN , patient and family on 06/02/2018   Florencia Reasons MD,  PhD  [facility] Pager [phone]. If 7PM-7AM, please contact night-coverage at [email or web address], password [facility] 06/02/2018, 1:19 PM  LOS: 3 days

## 2018-06-03 ENCOUNTER — Telehealth: Payer: Self-pay | Admitting: Family Medicine

## 2018-06-03 ENCOUNTER — Encounter (HOSPITAL_COMMUNITY): Payer: Self-pay | Admitting: Internal Medicine

## 2018-06-03 DIAGNOSIS — I513 Intracardiac thrombosis, not elsewhere classified: Secondary | ICD-10-CM

## 2018-06-03 DIAGNOSIS — S24112A Complete lesion at T2-T6 level of thoracic spinal cord, initial encounter: Secondary | ICD-10-CM

## 2018-06-03 DIAGNOSIS — C787 Secondary malignant neoplasm of liver and intrahepatic bile duct: Secondary | ICD-10-CM

## 2018-06-03 DIAGNOSIS — Z1612 Extended spectrum beta lactamase (ESBL) resistance: Secondary | ICD-10-CM

## 2018-06-03 DIAGNOSIS — R32 Unspecified urinary incontinence: Secondary | ICD-10-CM

## 2018-06-03 DIAGNOSIS — C7951 Secondary malignant neoplasm of bone: Secondary | ICD-10-CM

## 2018-06-03 DIAGNOSIS — R159 Full incontinence of feces: Secondary | ICD-10-CM

## 2018-06-03 DIAGNOSIS — C3432 Malignant neoplasm of lower lobe, left bronchus or lung: Secondary | ICD-10-CM

## 2018-06-03 DIAGNOSIS — G9529 Other cord compression: Secondary | ICD-10-CM

## 2018-06-03 DIAGNOSIS — E038 Other specified hypothyroidism: Secondary | ICD-10-CM

## 2018-06-03 DIAGNOSIS — N3080 Other cystitis without hematuria: Secondary | ICD-10-CM

## 2018-06-03 DIAGNOSIS — A499 Bacterial infection, unspecified: Secondary | ICD-10-CM

## 2018-06-03 DIAGNOSIS — C781 Secondary malignant neoplasm of mediastinum: Secondary | ICD-10-CM

## 2018-06-03 DIAGNOSIS — K625 Hemorrhage of anus and rectum: Secondary | ICD-10-CM

## 2018-06-03 LAB — BASIC METABOLIC PANEL
Anion gap: 8 (ref 5–15)
BUN: 23 mg/dL (ref 8–23)
CO2: 27 mmol/L (ref 22–32)
Calcium: 8.2 mg/dL — ABNORMAL LOW (ref 8.9–10.3)
Chloride: 99 mmol/L (ref 98–111)
Creatinine, Ser: 0.49 mg/dL (ref 0.44–1.00)
GFR calc Af Amer: >60 mL/min (ref >60)
GFR calc non Af Amer: >60 mL/min (ref >60)
Glucose, Bld: 81 mg/dL (ref 70–99)
Potassium: 4.2 mmol/L (ref 3.5–5.1)
Sodium: 134 mmol/L — ABNORMAL LOW (ref 135–145)

## 2018-06-03 LAB — CBC WITH DIFFERENTIAL/PLATELET
Abs Immature Granulocytes: 0.3 10*3/uL — ABNORMAL HIGH (ref 0.00–0.07)
Basophils Absolute: 0 10*3/uL (ref 0.0–0.1)
Basophils Relative: 1 %
Eosinophils Absolute: 0 10*3/uL (ref 0.0–0.5)
Eosinophils Relative: 0 %
HCT: 26.1 % — ABNORMAL LOW (ref 36.0–46.0)
HEMOGLOBIN: 8.2 g/dL — AB (ref 12.0–15.0)
Immature Granulocytes: 17 %
Lymphocytes Relative: 32 %
Lymphs Abs: 0.6 10*3/uL — ABNORMAL LOW (ref 0.7–4.0)
MCH: 27.6 pg (ref 26.0–34.0)
MCHC: 31.4 g/dL (ref 30.0–36.0)
MCV: 87.9 fL (ref 80.0–100.0)
MONOS PCT: 2 %
Monocytes Absolute: 0 10*3/uL — ABNORMAL LOW (ref 0.1–1.0)
Neutro Abs: 0.9 10*3/uL — ABNORMAL LOW (ref 1.7–7.7)
Neutrophils Relative %: 48 %
Platelets: 396 10*3/uL (ref 150–400)
RBC: 2.97 MIL/uL — ABNORMAL LOW (ref 3.87–5.11)
RDW: 16.9 % — ABNORMAL HIGH (ref 11.5–15.5)
WBC: 1.8 10*3/uL — ABNORMAL LOW (ref 4.0–10.5)
nRBC: 0 % (ref 0.0–0.2)

## 2018-06-03 MED ORDER — SUCRALFATE 1 GM/10ML PO SUSP
1.0000 g | Freq: Three times a day (TID) | ORAL | Status: DC
  Administered 2018-06-03 – 2018-06-21 (×69): 1 g via ORAL
  Filled 2018-06-03 (×70): qty 10

## 2018-06-03 MED ORDER — LIDOCAINE 5 % EX PTCH
2.0000 | MEDICATED_PATCH | CUTANEOUS | Status: DC
  Administered 2018-06-03 – 2018-06-21 (×19): 2 via TRANSDERMAL
  Filled 2018-06-03 (×19): qty 2

## 2018-06-03 MED ORDER — HYDROMORPHONE HCL 1 MG/ML IJ SOLN
0.5000 mg | INTRAMUSCULAR | Status: DC | PRN
  Administered 2018-06-03 – 2018-06-21 (×24): 0.5 mg via INTRAVENOUS
  Filled 2018-06-03 (×26): qty 0.5

## 2018-06-03 MED ORDER — TBO-FILGRASTIM 300 MCG/0.5ML ~~LOC~~ SOSY
300.0000 ug | PREFILLED_SYRINGE | Freq: Once | SUBCUTANEOUS | Status: AC
  Administered 2018-06-03: 300 ug via SUBCUTANEOUS
  Filled 2018-06-03: qty 0.5

## 2018-06-03 NOTE — Progress Notes (Signed)
Progress Note  Patient Name: [name] [name] Date of Encounter: 06/03/2018  Primary Cardiologist: No primary care provider on file.   Subjective   Currently without complaints of pain, breathlessness or other cardiovascular symptoms. Asked me promptly whether it is time for her to "call it in".  Inpatient Medications    Scheduled Meds: . dexamethasone  4 mg Oral Daily  . feeding supplement (ENSURE ENLIVE)  237 mL Oral BID BM  . fentaNYL  25 mcg Transdermal Q72H  . levothyroxine  75 mcg Oral QAC breakfast  . lidocaine  2 patch Transdermal Q24H  . nystatin  5 mL Oral QID  . pantoprazole  40 mg Oral Daily  . polyethylene glycol  17 g Oral BID  . senna  1 tablet Oral BID  . sodium chloride flush  3 mL Intravenous Q12H  . sucralfate  1 g Oral TID WC & HS  . Tbo-filgastrim (GRANIX) SQ  300 mcg Subcutaneous ONCE-1800   Continuous Infusions: . meropenem (MERREM) IV 1 g (06/03/18 0226)   PRN Meds: acetaminophen **OR** acetaminophen, alum & mag hydroxide-simeth, lidocaine, magic mouthwash w/lidocaine, ondansetron **OR** ondansetron (ZOFRAN) IV, prochlorperazine, sodium chloride flush   Vital Signs    Vitals:   06/02/18 0527 06/02/18 1423 06/02/18 2043 06/03/18 0628  BP: 130/80 99/64 102/62 (!) 143/73  Pulse: 66 73 76 68  Resp: 18 13 20 20   Temp: 98.1 F (36.7 C) 98.2 F (36.8 C) 98.2 F (36.8 C) 98.2 F (36.8 C)  TempSrc: Oral Oral Oral Oral  SpO2: 96% 93% 96% 96%  Weight:      Height:        Intake/Output Summary (Last 24 hours) at 06/03/2018 0856 Last data filed at 06/03/2018 0600 Gross per 24 hour  Intake 600 ml  Output -  Net 600 ml   Filed Weights   05/30/18 2331 05/31/18 0540 06/01/18 0456  Weight: 65.9 kg 66.2 kg 65.2 kg    Telemetry    Sinus rhythm- Personally Reviewed  ECG    05/30/2018 sinus rhythm with QS pattern in leads V1-V4 consistent with old anteroseptal infarction - Personally Reviewed  Physical Exam  Calm, comfortable  appearing GEN: No acute distress.   Neck: No JVD, no carotid bruits Cardiac:  RRR, no murmurs, rubs, or gallops.  Respiratory: Clear to auscultation bilaterally, no wheezes/ rales/ rhonchi GI: NABS, Soft, nontender, non-distended  MS: No edema; No deformity. Neuro:  Nonfocal, moving all extremities spontaneously Psych: Normal affect   Labs    Chemistry Recent Labs  Lab 05/28/18 1016  06/01/18 0433 06/02/18 0513 06/03/18 0553  NA 136   < > 134* 135 134*  K 4.3   < > 4.3 4.3 4.2  CL 101   < > 102 101 99  CO2 23   < > 27 26 27   GLUCOSE 152*   < > 102* 89 81  BUN 20   < > 22 26* 23  CREATININE 0.70   < > 0.70 0.51 0.49  CALCIUM 8.5*   < > 7.5* 7.9* 8.2*  PROT 5.8*  --   --   --   --   ALBUMIN 2.3*  --   --   --   --   AST 9*  --   --   --   --   ALT 11  --   --   --   --   ALKPHOS 181*  --   --   --   --  BILITOT 0.3  --   --   --   --   GFRNONAA >60   < > >60 >60 >60  GFRAA >60   < > >60 >60 >60  ANIONGAP 12   < > 5 8 8    < > = values in this interval not displayed.     Hematology Recent Labs  Lab 06/01/18 0433 06/02/18 0513 06/03/18 0553  WBC 12.2* 5.1 1.8*  RBC 2.92* 2.76* 2.97*  HGB 8.2* 7.7* 8.2*  HCT 25.9* 24.9* 26.1*  MCV 88.7 90.2 87.9  MCH 28.1 27.9 27.6  MCHC 31.7 30.9 31.4  RDW 17.2* 17.2* 16.9*  PLT 431* 383 396    Cardiac EnzymesNo results for input(s): TROPONINI in the last 168 hours. No results for input(s): TROPIPOC in the last 168 hours.   BNPNo results for input(s): BNP, PROBNP in the last 168 hours.   DDimer No results for input(s): DDIMER in the last 168 hours.   Radiology    Vas [place] Lower Extremity Venous (dvt)  Result Date: 06/02/2018  Lower Venous Study Indications: Edema.  Limitations: Patient positioning. Performing Technologist: Abram Sander RVS  Examination Guidelines: A complete evaluation includes B-mode imaging, spectral Doppler, color Doppler, and power Doppler as needed of all accessible portions of each vessel. Bilateral  testing is considered an integral part of a complete examination. Limited examinations for reoccurring indications may be performed as noted.  Right Venous Findings: +---------+---------------+---------+-----------+----------+--------------+          CompressibilityPhasicitySpontaneityPropertiesSummary        +---------+---------------+---------+-----------+----------+--------------+ CFV      Full           Yes      Yes                                 +---------+---------------+---------+-----------+----------+--------------+ SFJ      Full                                                        +---------+---------------+---------+-----------+----------+--------------+ FV Prox  Full                                                        +---------+---------------+---------+-----------+----------+--------------+ FV Mid   Full                                                        +---------+---------------+---------+-----------+----------+--------------+ FV DistalFull                                                        +---------+---------------+---------+-----------+----------+--------------+ PFV      Full                                                        +---------+---------------+---------+-----------+----------+--------------+  POP      Full           Yes      Yes                                 +---------+---------------+---------+-----------+----------+--------------+ PTV                                                   Not visualized +---------+---------------+---------+-----------+----------+--------------+ PERO                                                  Not visualized +---------+---------------+---------+-----------+----------+--------------+  Left Venous Findings: +---------+---------------+---------+-----------+----------+--------------+          CompressibilityPhasicitySpontaneityPropertiesSummary         +---------+---------------+---------+-----------+----------+--------------+ CFV      Full           Yes      Yes                                 +---------+---------------+---------+-----------+----------+--------------+ SFJ      Full                                                        +---------+---------------+---------+-----------+----------+--------------+ FV Prox  Full                                                        +---------+---------------+---------+-----------+----------+--------------+ FV Mid   Full                                                        +---------+---------------+---------+-----------+----------+--------------+ FV DistalFull                                                        +---------+---------------+---------+-----------+----------+--------------+ PFV      Full                                                        +---------+---------------+---------+-----------+----------+--------------+ POP      Full           Yes      Yes                                 +---------+---------------+---------+-----------+----------+--------------+  PTV                                                   Not visualized +---------+---------------+---------+-----------+----------+--------------+ PERO                                                  Not visualized +---------+---------------+---------+-----------+----------+--------------+    Summary: Right: There is no evidence of deep vein thrombosis in the lower extremity. However, portions of this examination were limited- see technologist comments above. No cystic structure found in the popliteal fossa. Left: There is no evidence of deep vein thrombosis in the lower extremity. However, portions of this examination were limited- see technologist comments above. No cystic structure found in the popliteal fossa.  *See table(s) above for measurements and observations. Electronically  signed by Deitra Mayo MD on 06/02/2018 at 1:51:44 PM.    Final     Cardiac Studies   Echocardiogram 05/31/2018: Study Conclusions  - Left ventricle: The cavity size was normal. Systolic function was mildly reduced. The estimated ejection fraction was in the range of 45% to 50%. Hypokinesis of the apical myocardium. Features are consistent with a pseudonormal left ventricular filling pattern, with concomitant abnormal relaxation and increased filling pressure (grade 2 diastolic dysfunction). There was a thrombus. - Regional wall motion abnormality: Hypokinesis of the mid anteroseptal, mid inferoseptal, apical inferior, apical septal, and apical myocardium. - Mitral valve: Calcified annulus. Mildly thickened leaflets . Late systolic prolapse, involving the anterior leaflet. There was moderate late systolic regurgitation due to prolapse. - Left atrium: The atrium was mildly dilated.  Impressions:  - Echo contrast (definity) used to evaluate for LV thrombus. The only image suggestive of thrombus is image 64 (screenshot saved with my annotation with arrow). There is a dense structure that comes in and out of view at approximately 10-11 o&'clock on that image. This could align with the mass seen on CT imaging, as the shape/size are similar. Favor LV thrombus.  Patient Profile     77 y.o. female with a history of extensive small cell lung cancer with metastasis to bones and liver, also epidural tumor with cord compression at T6, deconditioning, hypothyroidism, COPD, chronic pain, anemia secondary to GI bleed, and recently documented LV mural thrombus.  Assessment & Plan    1. Apical LV mural thrombus: seen on CT chest this admission and confirmed on echocardiogram. No evidence of mural thrombus on CT Chest 04/2018 though acuity remains unclear. Generally anticoagulation with coumadin is recommended for new LV mural thrombus, however this is a  difficult situation given this patients significant comorbidities. She has ongoing anemia though has been cleared by GI to start anticoagulation if indicated, metastatic lung cancer with significant cord compression at T6 though no contraindication for anticoagulation per oncology, and life expectancy of <1 year with continuation of current treatment. Palliative care was consulted to discuss goals of care. Per conversation with palliative care, it would be very difficult to manage coumadin as she is unable to transport easily for close monitoring and decision was made to not initiate anticoagulation.  - Will not start anticoagulation at this point per patient wishes.   2. GI bleed: patient presented with rectal bleeding with drop in  Hgb from 10 to 7.7. Seen by GI and felt to be 2/2 diverticular/hemorrhoidal bleeding. No further evaluation planned this admission. Hgb stable at 8.2 today.  - Continue management per primary team  3. Lung cancer with metastasis to liver, bone, and epidural with cord compression at T6: On chemotherapy. Per oncology, life expectancy 9-12 months with current treatment plan.  - Continue management per primary team and oncology  4. UTI: suspected based on CT abdomen findings. On IV antibiotics - Continue management per primary team   For questions or updates, please contact [place] Please consult [email or web address] for contact info under Cardiology/STEMI.      Signed, Abigail Butts, PA-C  06/03/2018, 8:56 AM   [phone]  I have seen and examined the patient along with Abigail Butts, PA-C .  I have reviewed the chart, notes and new data.  I agree with PA/NP's note.  Key new complaints: She is not in any discomfort and does not have chest pain.  At this point she seems more inclined towards palliative care and we openly discussed the possibility for hospice. Key examination changes: No overt signs of hypervolemia or congestive heart failure Key new  findings / data: Reviewed the results of the chest CT and echocardiogram.  PLAN: Life expectancy is very limited.  I would not recommend anticoagulation unless she chooses a very aggressive oncology treatment plan that might provide remission.  As far as I can tell from review of the chart this is highly unlikely.   Anticoagulation has not been initiated.  If we do decide to change to an aggressive treatment modality, I would use a direct oral anticoagulant.  Although we do not have any clinical trial data for the use of direct oral anticoagulants for left ventricular thrombus, I believe these would be preferable over warfarin for their ease of use and shorter duration of activity and availability of an immediately active antidote.  I would specifically choose Eliquis for its lower GI bleeding potential. We will wait on the results of her discussion with her oncologist.  Sanda Klein, MD, [place] [phone] 06/03/2018, 2:52 PM

## 2018-06-03 NOTE — Telephone Encounter (Signed)
Paperwork received and placed with Dr. Yong Channel

## 2018-06-03 NOTE — Progress Notes (Signed)
Pharmacy Antibiotic Note  [name] [name] is a 77 y.o. female admitted on 05/30/2018 with GI bleeding, LA thrombus in setting of metastatic Lung CA.  She is starting on antibiotics for emphysematous cystitis.  Only "symptom" is suprapubic tenderness on deep palpation that could be related to cystitis vs superior pubic ramus fracture.  Urinary catheter in place.  She has a hx of ESBL-producing Ecoli in urine.  Pharmacy has been consulted for Meropenem dosing.  06/03/2018:  Day 3 Meropenem  WBC 1.8 and dropping - note patient last received chemo on 12/11  Renal function at patient's baseline  Afebrile  Plan: 1) Continue Meropenem 1g IV q8h 2) Monitor renal function and cx data    Height: 5\' 5"  (165.1 cm) Weight: 143 lb 12.8 oz (65.2 kg) IBW/kg (Calculated) : 57  Temp (24hrs), Avg:98.2 F (36.8 C), Min:98.2 F (36.8 C), Max:98.2 F (36.8 C)  Recent Labs  Lab 05/30/18 1724 05/30/18 1841 05/30/18 2051 05/31/18 0651 06/01/18 0433 06/02/18 0513 06/03/18 0553  WBC 25.5*  --   --  15.1* 12.2* 5.1 1.8*  CREATININE 0.93  --   --  0.57 0.70 0.51 0.49  LATICACIDVEN  --  1.87 2.35*  --  1.5  --   --     Estimated Creatinine Clearance: 51.3 mL/min (by C-G formula based on SCr of 0.49 mg/dL).    Allergies  Allergen Reactions  . Atorvastatin     Intolerant to all statins   . Cyclobenzaprine Other (See Comments)    Bradycardia, hypotension  . Ezetimibe      INTOL to Zetia  . Rosuvastatin     intolerant to all statins  . Gadolinium Derivatives Nausea Only    Pt became very nauseous after gadolinium administration//lh  . Nicoderm [Nicotine] Rash    Unable to use the patches---caused severe rash to area placed    Antimicrobials this admission: 12/13 Meropenem >>   Dose adjustments this admission:  Microbiology results: 12/12 UCx:  ESBL Ecoli  Previous cx data 04/01/18 UCx: ESBL Ecoli   Thank you for allowing pharmacy to be a part of this patient's care.  Kara Mead 06/03/2018 8:22 AM

## 2018-06-03 NOTE — Consult Note (Signed)
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  At the time of presentation, had near complete cord compression at the level of T6 and widely metastatic cancer involving T1-T9, unstable T9 and evidence of pathological fracture.  She is received 10 fractions of radiation to this area and is on Decadron for cord compression.  She has not walked independently for greater than 3 weeks.  He has worsening bowel and bladder symptoms.  She complains of burning pain her chest and upper back, she says it feels like severe indigestion or reflux.  He is currently hospitalized after having bright red blood in her stools and a significant urinary tract infection from prolonged urinary retention.  Met with her family to discuss goals of care to provide recommendations on her symptom management. Patient, her husband, 2 sons present.  Clinical Assessment and Goals of Care:   Small Cell Lung Cancer Dx 04/2018- widely metastatic disease, radiation completed, on systemic chemotherapy  We discussed prognosis, disease trajectory and symptoms, side effects of treatments to date  QOL considerations and care location considerations  Family are requesting to see Dr. Julien Nordmann during hospitalization for more clarity on tx options, risks and benefits.  She will not be eligible for hospice while on  tx with chemotherapy-discussed this with her family.  Spinal Cord Compression T6, metatstatic disease in thoracic spine  Large burden of symptoms- pain, possible autonomic issues, paraplegia, loss of independent bowel and bladder function  Radiation completed-helped with pain control but we discussed reality and likelyhood that her functional status may not recover.  Cauda Equina Syndrome, Partial Paraplegia- not ambulatory  Now unable to walk or care for her self independently for several weeks.  Spinal Lesion induced fecal incontinence and urinary retention, requires 24/7 ATC care and assistance-has been in SNF, may need indwelling foley, needs frequent attention to bowel patterns.  HIGH risk for UTI, may need long term foley, now has ESBL infection.  High risk for severe impaction and ongoing soiling-increases rick for UTI.  Resistant UTI, recurrent- now on meropenem, resistant ESBL  Requires IV antibiotics, she has a PICC line now`  Large Left Mural Thrombus, contraindications to anticoagulation  We discussed this at length- she would be very difficult to manage on coumadin- she cannot transport easily and dosing coumadin will be challenging.  At this point will not initiate anticogulation, could be more risk involved with bleeding.  Patient and family agree to not initiate.  Cancer related Pain  Fentanyl 16mg TD  Hydromorphone IV breakthrough  Carafate  for Esophagus  Lidoderm patches T-Spine  [name] [name] and her family are just processing the news of her prognosis today and what this trajectory may look like, we discussed hospice care options. We discussed code status and she is now DNR.  Will follow.        Time In: 9AM Time Out: 10:22AM Time Total: 80 min Greater than 50%  of this time was spent counseling and coordinating care related to the above assessment and plan.  Signed by: Lane Hacker, DO   Please contact Palliative Medicine Team phone at  [phone] for questions and concerns.  For individual provider: See Shea Evans

## 2018-06-03 NOTE — Progress Notes (Signed)
PT Cancellation Note  Patient Details Name: [name] [name] MRN: [id number] DOB: 1941/03/05   Cancelled Treatment:    Reason Eval/Treat Not Completed: Patient declined, no reason specified. Will check back another day at pt's request.    Weston Anna, PT Acute Rehabilitation Services Pager: [phone] Office: [phone]

## 2018-06-03 NOTE — Telephone Encounter (Signed)
See note  Copied from [place] [phone]. Topic: General - Other >> Jun 03, 2018 11:10 AM Leward Quan A wrote: Reason for CRM: Haskel Khan patient son called to say that he is bringing paperwork over to be filled out for patient to receive LTC from [facility] but may be going to Hospice since she was given 2 to 3 months to live. Asking that this paper work be completed and signed by Dr Yong Channel in a timely manner so that they can be reimbursed for services in the past.

## 2018-06-03 NOTE — Progress Notes (Signed)
PROGRESS NOTE  [name] [name] OIN:[id number] DOB: 1940-10-04 DOA: 05/30/2018 PCP: Marin Olp, MD  HPI/Recap of past 24 hours:  Continue to have  bad heart burn and burping a lot when stay in bed,She has very poor oral intake,   bp borderline low, does not appear symptomatic, very low am free cortisol level  She is on chronic decadrone  Has bowel/bladder incontinence, bm is brown, no blood  She is very weak, needs at least 2 person assist to get out of bed, not able to stand up on herself She has slight intermittent confusion  No fever, wbc trending down, now have  neutropenia  Husband at bedside   Assessment/Plan: Principal Problem:   LV (left ventricular) mural thrombus without MI Active Problems:   Hypothyroidism   Spinal cord compression due to malignant neoplasm metastatic to spine ([place])   Small cell carcinoma of lower lobe of left lung (HCC)   BRBPR (bright red blood per rectum)   Complete lesion at T6 level of thoracic spinal cord (HCC)   Urinary and fecal incontinence  GI bleed with BRBPR  -She is sent from oncology clinic to admission due to GI bleed and lethargy -No active bleed since in the hospital, though hemoglobin dropped from 10  On admission to 8, has been 8.2x2 days -GI consulted though bleed from diverticular or hemorrhoids and signed off .   -no blood in stool since in the hospital.  Acute on chronic anemia in the setting of malignancy -no sign of blood loss in the hospital, stool is brown, no visible blood. -hgb continue to decrease, possible from chemo , last chemo on 12/11. -supportive transfusion to keep hgb> 7,   wbc trending down, now neutropenia, start GSCF today  left ventricular thrombus -CT ab report "visualization of a 1.9 x 1.2 x 1.9 cm apical left ventricular clot. This places the patient at increased risk for embolic stroke or other vascular occlusions" -Cardiology consulted, patient is hesitating to start on  anticoagulation,  high risk of stroke if not to start on anticoagulation ,will follow up on cardiology recommendation  -Palliative care consulted for goals of care discussion  Bilateral ankle pitting edema -venous ultrasound negative for DVT,  -she has been on Decadron since radiation, Decadron could cause edema as well.  --she also have diastolic dysfunction on previous echocardiogram, blood pressure borderline low prohibit Lasix use ,  -Elevate legs, edema improved  Borderline low blood pressure asymptomatic  -a.m. cortisol level very low at 0.8  Emphysematous cystitis, mild right hydronephrosis and hydroureter with mild diffuse uroepithelial thickening and enhancement, compatible with infection. identified on CT abdomen  On 12/12 -UA with few bacteria greater than 50 WBC, large leukocyte, urine culture in process, he does has some suprapubic tenderness on deep palpation , but difficult to decide whether this is related to cystitis or her superior pubic ramus fracture -Patient is immunosuppressed, case discussed with infectious disease who recommended treat with antibiotic meropenem for 10 to 14 days -Start meropenem for ESBL according to past urine culture in October 2019 -Urine culture + ESBL  E. Coli, continue meropenem   Extensive stage small cell lung cancer,Widespread metastatic disease to the bones , epidural and liver  widespread metastatic disease involving numerous thoracic vertebrae including T1, T2, T5, T6, T7, T9, T11, and T12. Pathologic fracture T9. Bulky dorsal epidural tumor at T6 results in significant cord compression.   Per previous neurosurgery recommendation " biopsy shows small cell, no role for surgery"  She completed palliative radiotherapy for T6 cord compression in 04/2018.  She is continued on Decadron 4 mg daily.   The patient is status post cycle 2 days 1 and 2 of carboplatin and etoposide. Day 3 on 12/12 was held due to gi bleed and lethargy, she is sent  to the ED and hospitalized on the same day. She did not received Scheduled for PEG filgrastim on 06/01/2018 due to hospitalization.   CT ab obtained in the ED on 12/12 showed:   Musculoskeletal: Bilateral sacral ala fractures are again demonstrated with interval diffuse patchy bone sclerosis and Nonunion.  Also again demonstrated are right inferior pubic ramus and ischial/anterior acetabular fractures. Mild increase in associated callus formation with nonunion.  Interval visualization of a mildly comminuted right superior pubic ramus fracture with patchy bone sclerosis.  Moderate right hip degenerative changes. Lumbar and lower thoracic spine degenerative changes with progressive patchy sclerosis involving multiple lumbar and lower thoracic vertebral bodies.  Indigestion -Patient reports indigestion since started on radiation therapy -Continue PPI  Chronic cancer pain -Continue home meds including fentanyl patch,   COPD /she continues to smoke -No wheezing, no hypoxia at rest  Hypothyroidism, continue Synthroid supplement  Constipation, stool softener   FTT: Very deconditioned and weak, need 2 person assists to get out of bed, not able to stand up by herself   prognosis guarded, patient is at high risk of decompensation due to multiple significant medical illness as described above.      Code Status: DNR  Family Communication: patient and husband  Disposition Plan: not ready to discharge, at risk of decompensation Needs cardiology clearance  Consultants:  [facility], signed off  Cardiology  Oncology Dr Julien Nordmann   Infectious disease Dr Baxter Flattery over the phone  Palliative care   Procedures:  none  Antibiotics:  Meropenem    Objective: BP 110/69 (BP Location: Left Arm)   Pulse 67   Temp 98.5 F (36.9 C) (Oral)   Resp 20   Ht 5\' 5"  (1.651 m)   Wt 65.2 kg   SpO2 92%   BMI 23.93 kg/m   Intake/Output Summary (Last 24 hours) at 06/03/2018 1626 Last data filed  at 06/03/2018 1600 Gross per 24 hour  Intake 820 ml  Output -  Net 820 ml   Filed Weights   05/30/18 2331 05/31/18 0540 06/01/18 0456  Weight: 65.9 kg 66.2 kg 65.2 kg    Exam: Patient is examined daily including today on 06/03/2018, exams remain the same as of yesterday except that has changed    General:  Very frail and pale, hard of hearing  Cardiovascular: RRR  Respiratory: diminished at basis , no wheezing  Abdomen: she does has mild tender on deep palpation around suprapubic region and left lower quadrant of her abdomen, no guarding, no rebound, Soft/ND, positive BS  Musculoskeletal: bilateral ankle pitting Edema has improved   Neuro: alert, oriented x3, slight confusion, able to self correct  Data Reviewed: Basic Metabolic Panel: Recent Labs  Lab 05/30/18 1724 05/31/18 0651 06/01/18 0433 06/02/18 0513 06/03/18 0553  NA 133* 135 134* 135 134*  K 4.0 4.1 4.3 4.3 4.2  CL 100 102 102 101 99  CO2 22 24 27 26 27   GLUCOSE 96 100* 102* 89 81  BUN 30* 22 22 26* 23  CREATININE 0.93 0.57 0.70 0.51 0.49  CALCIUM 8.2* 7.5* 7.5* 7.9* 8.2*  MG  --   --  2.5* 2.6*  --    Liver Function Tests: Recent  Labs  Lab 05/28/18 1016  AST 9*  ALT 11  ALKPHOS 181*  BILITOT 0.3  PROT 5.8*  ALBUMIN 2.3*   No results for input(s): LIPASE, AMYLASE in the last 168 hours. No results for input(s): AMMONIA in the last 168 hours. CBC: Recent Labs  Lab 05/28/18 1016 05/30/18 1724 05/31/18 0651 06/01/18 0433 06/02/18 0513 06/03/18 0553  WBC 22.2* 25.5* 15.1* 12.2* 5.1 1.8*  NEUTROABS 19.3*  --   --  10.6* 3.4 0.9*  HGB 9.9* 10.2* 8.2* 8.2* 7.7* 8.2*  HCT 31.3* 32.6* 26.1* 25.9* 24.9* 26.1*  MCV 86.5 88.6 88.5 88.7 90.2 87.9  PLT 479* 559* 461* 431* 383 396   Cardiac Enzymes:   No results for input(s): CKTOTAL, CKMB, CKMBINDEX, TROPONINI in the last 168 hours. BNP (last 3 results) Recent Labs    04/28/18 0549  BNP 640.3*    ProBNP (last 3 results) No results for  input(s): PROBNP in the last 8760 hours.  CBG: No results for input(s): GLUCAP in the last 168 hours.  Recent Results (from the past 240 hour(s))  Culture, Urine     Status: Abnormal   Collection Time: 05/30/18  5:29 PM  Result Value Ref Range Status   Specimen Description   Final    URINE, CLEAN CATCH Performed at [facility], [place] [address], [place], [place] [address]    Special Requests   Final    NONE Performed at [facility], [place] [address] [place], [place] [address]    Culture (A)  Final    >=100,000 COLONIES/mL ESCHERICHIA COLI Confirmed Extended Spectrum Beta-Lactamase Producer (ESBL).  In bloodstream infections from ESBL organisms, carbapenems are preferred over piperacillin/tazobactam. They are shown to have a lower risk of mortality.    Report Status 06/02/2018 FINAL  Final   Organism ID, Bacteria ESCHERICHIA COLI (A)  Final      Susceptibility   Escherichia coli - MIC*    AMPICILLIN >=32 RESISTANT Resistant     CEFAZOLIN >=64 RESISTANT Resistant     CEFTRIAXONE >=64 RESISTANT Resistant     CIPROFLOXACIN 0.5 SENSITIVE Sensitive     GENTAMICIN >=16 RESISTANT Resistant     IMIPENEM <=0.25 SENSITIVE Sensitive     NITROFURANTOIN <=16 SENSITIVE Sensitive     TRIMETH/SULFA >=320 RESISTANT Resistant     AMPICILLIN/SULBACTAM 16 INTERMEDIATE Intermediate     PIP/TAZO <=4 SENSITIVE Sensitive     Extended ESBL POSITIVE Resistant     * >=100,000 COLONIES/mL ESCHERICHIA COLI     Studies: No results found.  Scheduled Meds: . dexamethasone  4 mg Oral Daily  . feeding supplement (ENSURE ENLIVE)  237 mL Oral BID BM  . fentaNYL  25 mcg Transdermal Q72H  . levothyroxine  75 mcg Oral QAC breakfast  . lidocaine  2 patch Transdermal Q24H  . nystatin  5 mL Oral QID  . pantoprazole  40 mg Oral Daily  . polyethylene glycol  17 g Oral BID  . senna  1 tablet Oral BID  . sodium chloride flush  3 mL Intravenous Q12H  . sucralfate  1 g Oral  TID WC & HS  . Tbo-filgastrim (GRANIX) SQ  300 mcg Subcutaneous ONCE-1800    Continuous Infusions: . meropenem (MERREM) IV 1 g (06/03/18 1017)     Time spent: 61mins, case discussed with oncology and palliative care I have personally reviewed and interpreted on  06/03/2018 daily labs, tele strips, imagings as discussed above under date review session and assessment and  plans.  I reviewed all nursing notes, pharmacy notes, consultant notes,  vitals, pertinent old records  I have discussed plan of care as described above with RN , patient and family on 06/03/2018   Florencia Reasons MD, PhD  [facility] Pager [phone]. If 7PM-7AM, please contact night-coverage at [email or web address], password [email or web address] 06/03/2018, 4:26 PM  LOS: 4 days

## 2018-06-03 NOTE — Progress Notes (Signed)
Subjective: The patient is seen and examined today.  Her son was at the bedside.  This is a very pleasant 77 years old white female recently diagnosed with extensive stage small cell lung cancer presented with left lower lobe lung mass in addition to bilateral hilar and mediastinal lymphadenopathy as well as metastatic disease to the bones and liver with T6 cord compression status post palliative radiotherapy with persistent weakness in the lower extremities.  She is currently undergoing systemic chemotherapy with carboplatin, etoposide status post 2 cycles.  The patient was receiving her chemotherapy at the cancer center when she started having abdominal pain in addition to increased weakness and fatigue.  She also had rectal bleeding.  She was admitted to [facility] for [facility] evaluation.  She had evaluation by gastroenterology who felt that her bleeding is probably coming from diverticular disease or hemorrhoids.  The patient had repeat CT scan of the abdomen and pelvis on admission which showed improvement of her disease after 1 cycle of the treatment but there was progressive sclerotic bony disease which is expected after systemic chemotherapy as a healing process in addition the patient was found to have 1.9 cm apical left ventricular clot.  She was evaluated by cardiology and they are concerned about starting anticoagulation because of her previous metastatic disease at T6 with cord compression.  The patient is feeling fine today except for the generalized weakness.  She has the urge to go to the restroom for urine and bowel movement but it is usually too late when she receives help.  She now has pancytopenia secondary to her recent chemotherapy.  I was consulted to discuss with the patient her condition and her goals of care again.  Objective: Vital signs in last 24 hours: Temp:  [98.2 F (36.8 C)-98.5 F (36.9 C)] 98.5 F (36.9 C) (12/16 1246) Pulse Rate:  [67-76] 67 (12/16 1246) Resp:   [20] 20 (12/16 0628) BP: (102-143)/(62-73) 110/69 (12/16 1246) SpO2:  [92 %-96 %] 92 % (12/16 1246)  Intake/Output from previous day: 12/15 0701 - 12/16 0700 In: 600 [IV Piggyback:600] Out: -  Intake/Output this shift: Total I/O In: 220 [P.O.:120; IV Piggyback:100] Out: -   General appearance: alert, cooperative, fatigued and no distress Resp: clear to auscultation bilaterally Cardio: regular rate and rhythm, S1, S2 normal, no murmur, click, rub or gallop GI: soft, non-tender; bowel sounds normal; no masses,  no organomegaly Extremities: extremities normal, atraumatic, no cyanosis or edema  Lab Results:  Recent Labs    06/02/18 0513 06/03/18 0553  WBC 5.1 1.8*  HGB 7.7* 8.2*  HCT 24.9* 26.1*  PLT 383 396   BMET Recent Labs    06/02/18 0513 06/03/18 0553  NA 135 134*  K 4.3 4.2  CL 101 99  CO2 26 27  GLUCOSE 89 81  BUN 26* 23  CREATININE 0.51 0.49  CALCIUM 7.9* 8.2*    Studies/Results: Vas [place] Lower Extremity Venous (dvt)  Result Date: 06/02/2018  Lower Venous Study Indications: Edema.  Limitations: Patient positioning. Performing Technologist: Abram Sander RVS  Examination Guidelines: A complete evaluation includes B-mode imaging, spectral Doppler, color Doppler, and power Doppler as needed of all accessible portions of each vessel. Bilateral testing is considered an integral part of a complete examination. Limited examinations for reoccurring indications may be performed as noted.  Right Venous Findings: +---------+---------------+---------+-----------+----------+--------------+          CompressibilityPhasicitySpontaneityPropertiesSummary        +---------+---------------+---------+-----------+----------+--------------+ CFV      Full  Yes      Yes                                 +---------+---------------+---------+-----------+----------+--------------+ SFJ      Full                                                         +---------+---------------+---------+-----------+----------+--------------+ FV Prox  Full                                                        +---------+---------------+---------+-----------+----------+--------------+ FV Mid   Full                                                        +---------+---------------+---------+-----------+----------+--------------+ FV DistalFull                                                        +---------+---------------+---------+-----------+----------+--------------+ PFV      Full                                                        +---------+---------------+---------+-----------+----------+--------------+ POP      Full           Yes      Yes                                 +---------+---------------+---------+-----------+----------+--------------+ PTV                                                   Not visualized +---------+---------------+---------+-----------+----------+--------------+ PERO                                                  Not visualized +---------+---------------+---------+-----------+----------+--------------+  Left Venous Findings: +---------+---------------+---------+-----------+----------+--------------+          CompressibilityPhasicitySpontaneityPropertiesSummary        +---------+---------------+---------+-----------+----------+--------------+ CFV      Full           Yes      Yes                                 +---------+---------------+---------+-----------+----------+--------------+ SFJ      Full                                                        +---------+---------------+---------+-----------+----------+--------------+  FV Prox  Full                                                        +---------+---------------+---------+-----------+----------+--------------+ FV Mid   Full                                                         +---------+---------------+---------+-----------+----------+--------------+ FV DistalFull                                                        +---------+---------------+---------+-----------+----------+--------------+ PFV      Full                                                        +---------+---------------+---------+-----------+----------+--------------+ POP      Full           Yes      Yes                                 +---------+---------------+---------+-----------+----------+--------------+ PTV                                                   Not visualized +---------+---------------+---------+-----------+----------+--------------+ PERO                                                  Not visualized +---------+---------------+---------+-----------+----------+--------------+    Summary: Right: There is no evidence of deep vein thrombosis in the lower extremity. However, portions of this examination were limited- see technologist comments above. No cystic structure found in the popliteal fossa. Left: There is no evidence of deep vein thrombosis in the lower extremity. However, portions of this examination were limited- see technologist comments above. No cystic structure found in the popliteal fossa.  *See table(s) above for measurements and observations. Electronically signed by Deitra Mayo MD on 06/02/2018 at 1:51:44 PM.    Final     Medications: I have reviewed the patient's current medications.  CODE STATUS: No CODE BLUE  Assessment/Plan: This is a very pleasant 77 years old white female recently diagnosed with extensive stage small cell lung cancer and currently undergoing systemic chemotherapy with carboplatin and etoposide with initial partial response.  The patient continues to have persistent weakness from her previous T6 compression fraction with cord compression.  She is status post palliative radiotherapy to that area. For the  chemotherapy-induced pancytopenia, we will continue to monitor her total white blood count closely and consider the patient for treatment with  Granix 300 mcg subcutaneously if her absolute neutrophil count is less than 500.  For the chemotherapy-induced anemia, I will consider the patient for transfusion if her hemoglobin is less than 8.0G/DL.   For the left apical ventricular clot, she is followed by cardiology and I will leave the final recommendation for them regarding anticoagulation or not. I had a lengthy discussion with the patient and her son today about her current condition and treatment options.  I also discussed with the patient again the option of palliative care and hospice referral versus continuation of her treatment with chemotherapy.  The patient and her son would like some more time to discuss this option in details with the rest of the family and to come with a decision. The patient was seen by the palliative care team for discussion of the goals of care too. I explained to the patient that we will be happy to take care of her needs whether she is receiving chemotherapy or not. Thank you so much for taking good care of [name] [name], I will continue to follow up the patient with you and assist in her management on as-needed basis.  LOS: 4 days    Eilleen Kempf 06/03/2018

## 2018-06-03 NOTE — Care Management Important Message (Signed)
Important Message  Patient Details  Name: [name] [name] MRN: [id number] Date of Birth: [birth year]   Medicare Important Message Given:  Yes    Kerin Salen 06/03/2018, 2:53 [place] Message  Patient Details  Name: [name] [name] MRN: [id number] Date of Birth: 09-21-1940   Medicare Important Message Given:  Yes    Kerin Salen 06/03/2018, 2:53 PM

## 2018-06-04 ENCOUNTER — Other Ambulatory Visit: Payer: Self-pay | Admitting: Medical Oncology

## 2018-06-04 DIAGNOSIS — C3432 Malignant neoplasm of lower lobe, left bronchus or lung: Secondary | ICD-10-CM

## 2018-06-04 LAB — BASIC METABOLIC PANEL
Anion gap: 8 (ref 5–15)
BUN: 26 mg/dL — ABNORMAL HIGH (ref 8–23)
CALCIUM: 8.2 mg/dL — AB (ref 8.9–10.3)
CO2: 26 mmol/L (ref 22–32)
Chloride: 100 mmol/L (ref 98–111)
Creatinine, Ser: 0.51 mg/dL (ref 0.44–1.00)
GFR calc Af Amer: >60 mL/min (ref >60)
GFR calc non Af Amer: >60 mL/min (ref >60)
Glucose, Bld: 81 mg/dL (ref 70–99)
Potassium: 4.4 mmol/L (ref 3.5–5.1)
Sodium: 134 mmol/L — ABNORMAL LOW (ref 135–145)

## 2018-06-04 LAB — CBC WITH DIFFERENTIAL/PLATELET
Abs Immature Granulocytes: 0 10*3/uL (ref 0.00–0.07)
Basophils Absolute: 0 10*3/uL (ref 0.0–0.1)
Basophils Relative: 0 %
Eosinophils Absolute: 0 10*3/uL (ref 0.0–0.5)
Eosinophils Relative: 1 %
HCT: 25.1 % — ABNORMAL LOW (ref 36.0–46.0)
Hemoglobin: 7.9 g/dL — ABNORMAL LOW (ref 12.0–15.0)
Immature Granulocytes: 0 %
Lymphocytes Relative: 65 %
Lymphs Abs: 0.6 10*3/uL — ABNORMAL LOW (ref 0.7–4.0)
MCH: 27.7 pg (ref 26.0–34.0)
MCHC: 31.5 g/dL (ref 30.0–36.0)
MCV: 88.1 fL (ref 80.0–100.0)
Monocytes Absolute: 0 10*3/uL — ABNORMAL LOW (ref 0.1–1.0)
Monocytes Relative: 4 %
NEUTROS PCT: 30 %
Neutro Abs: 0.3 10*3/uL — ABNORMAL LOW (ref 1.7–7.7)
PLATELETS: 321 10*3/uL (ref 150–400)
RBC: 2.85 MIL/uL — ABNORMAL LOW (ref 3.87–5.11)
RDW: 16.8 % — ABNORMAL HIGH (ref 11.5–15.5)
WBC: 1 10*3/uL — CL (ref 4.0–10.5)
nRBC: 0 % (ref 0.0–0.2)

## 2018-06-04 MED ORDER — TBO-FILGRASTIM 300 MCG/0.5ML ~~LOC~~ SOSY
300.0000 ug | PREFILLED_SYRINGE | Freq: Every day | SUBCUTANEOUS | Status: AC
  Administered 2018-06-04 – 2018-06-06 (×3): 300 ug via SUBCUTANEOUS
  Filled 2018-06-04 (×3): qty 0.5

## 2018-06-04 NOTE — Progress Notes (Signed)
PT Cancellation Note  Patient Details Name: [name] [name] MRN: [id number] DOB: 1941-02-02   Cancelled Treatment:    Reason Eval/Treat Not Completed: Fatigue/lethargy limiting ability to participate - Pt very drowsy upon PT arrival. PT attempted to wake pt to mobilize, pt states "come back in the morning". PT oriented pt to time (12 pm), and pt continued to say "I'll try in the morning". PT to check back tomorrow, as schedule allows.  Julien Girt, PT Acute Rehabilitation Services Pager [phone]  Office [phone]    Roxine Caddy D [name] 06/04/2018, 12:24 PM

## 2018-06-04 NOTE — Progress Notes (Signed)
PROGRESS NOTE  [name] [name] JQB:[id number] DOB: January 07, 1941 DOA: 05/30/2018 PCP: Marin Olp, MD  Brief Summary:   H/o small cell lung cancer with metastasis to the liver, spine, epidural space with cord compression at T6 s/p XRT, on chemotherapy (last chemo on 12/11), sent from oncologist office with BRBPR and lethargy  Found to have apical LV clot.  emphasematous cystitis/ESBL in urine culture. GI signed off, oncology/cardiology/palliative care consulted    HPI/Recap of past 24 hours:  Wbc continue to drop, she does not feel good, very weak, in pain Reports feeling cold last night, she is not happy about the nursing care last night, she called many times ask for help due to feeling cold, but "no one attended her" No fever   Continue to have  bad heart burn and burping a lot when staying in bed,She has very poor oral intake,  She is very weak, needs at least 2 person assist to get out of bed, not able to stand up by herself. Has bowel/bladder incontinence, bm is brown, no blood   Husband at bedside   Assessment/Plan: Principal Problem:   Left ventricular thrombus without MI Active Problems:   Hypothyroidism   Spinal cord compression due to malignant neoplasm metastatic to spine (HCC)   Small cell carcinoma of lower lobe of left lung (HCC)   BRBPR (bright red blood per rectum)   Complete lesion at T6 level of thoracic spinal cord (HCC)   Urinary and fecal incontinence   Rectal bleeding   ESBL (extended spectrum beta-lactamase) producing bacteria infection   Emphysematous cystitis  GI bleed with BRBPR  -She is sent from oncology clinic for admission due to GI bleed and lethargy -No active bleed since in the hospital,  -GI consulted thought bleeding is  from diverticular or hemorrhoids and signed off .   -no blood in stool since in the hospital.  Acute on chronic anemia in the setting of malignancy -no sign of blood loss in the hospital, stool is brown, no  visible blood. -hgb continue to decrease, possible from chemo , last chemo on 12/11. -supportive transfusion to keep hgb> 7,   Neutropenia, started GSCF on 12/16  left ventricular thrombus -CT ab report "visualization of a 1.9 x 1.2 x 1.9 cm apical left ventricular clot. This places the patient at increased risk for embolic stroke or other vascular occlusions" -Cardiology consulted, patient is hesitating to start on anticoagulation,  high risk of stroke if not to start on anticoagulation ,will follow up on cardiology recommendation  -Palliative care consulted for goals of care discussion  Bilateral ankle pitting edema -venous ultrasound negative for DVT,  -she has been on Decadron since radiation, Decadron could cause edema as well.  --she also have diastolic dysfunction on previous echocardiogram, blood pressure borderline low, prohibit Lasix use ,  -Elevate legs, edema improved  Borderline low blood pressure, initial on presentation -a.m. cortisol level very low at 0.8 -seems improved after started on abx  Emphysematous cystitis, mild right hydronephrosis and hydroureter with mild diffuse uroepithelial thickening and enhancement, compatible with infection. identified on CT abdomen  On 12/12 -Patient is immunosuppressed, case discussed with infectious disease who recommended treat with antibiotic meropenem for 10 to 14 days --Urine culture + ESBL  E. Coli, continue meropenem (she has a picc line placed two months ago, checked with IV team states picc can stay in for up to 9months if no complications.)   Extensive stage small cell lung cancer,Widespread metastatic disease to  the spine , epidural space and liver  widespread metastatic disease involving numerous thoracic vertebrae including T1, T2, T5, T6, T7, T9, T11, and T12. Pathologic fracture T9. Bulky dorsal epidural tumor at T6 results in significant cord compression.   Per previous neurosurgery recommendation " biopsy shows  small cell, no role for surgery"  She completed palliative radiotherapy for T6 cord compression in 04/2018.  She is continued on Decadron 4 mg daily.   The patient is status post cycle 2 days 1 and 2 of carboplatin and etoposide. Day 3 on 12/12 was held due to gi bleed and lethargy, she is sent to the ED and hospitalized on the same day. She did not received Scheduled for PEG filgrastim on 06/01/2018 due to hospitalization.   CT ab/pel obtained in the ED on 12/12 showed:  Musculoskeletal: Bilateral sacral ala fractures are again demonstrated with interval diffuse patchy bone sclerosis and Nonunion.  Also again demonstrated are right inferior pubic ramus and ischial/anterior acetabular fractures. Mild increase in associated callus formation with nonunion.  Interval visualization of a mildly comminuted right superior pubic ramus fracture with patchy bone sclerosis.  Moderate right hip degenerative changes. Lumbar and lower thoracic spine degenerative changes with progressive patchy sclerosis involving multiple lumbar and lower thoracic vertebral bodies.  Severe Indigestion -Patient reports indigestion since started on radiation therapy -Continue PPI  Chronic cancer pain -Continue home meds including fentanyl patch,  -palliative care consulted  COPD /she continues to smoke -No wheezing, no hypoxia at rest  Hypothyroidism, continue Synthroid supplement  Constipation, stool softener   FTT: Very deconditioned and weak, need 2 person assists to get out of bed, not able to stand up by herself   prognosis guarded, patient is at high risk of decompensation due to multiple significant medical illness as described above.      Code Status: DNR  Family Communication: patient and husband  Disposition Plan: not ready to discharge, at risk of decompensation Needs cardiology clearance, palliative care following for goals of care. SNF if patient decide on continue aggressive therapy ( need  neutropenia to improve) , otherwise home with 24/7 care and home hospice.  Consultants:  [facility], signed off  Cardiology  Oncology Dr Julien Nordmann   Infectious disease Dr Baxter Flattery over the phone  Palliative care   Procedures:  none  Antibiotics:  Meropenem    Objective: BP 129/71 (BP Location: Left Arm)   Pulse 71   Temp 98.6 F (37 C) (Oral)   Resp 18   Ht 5\' 5"  (1.651 m)   Wt 64.4 kg   SpO2 94%   BMI 23.61 kg/m   Intake/Output Summary (Last 24 hours) at 06/04/2018 0926 Last data filed at 06/04/2018 0500 Gross per 24 hour  Intake 640 ml  Output -  Net 640 ml   Filed Weights   05/31/18 0540 06/01/18 0456 06/04/18 0539  Weight: 66.2 kg 65.2 kg 64.4 kg    Exam: Patient is examined daily including today on 06/04/2018, exams remain the same as of yesterday except that has changed    General:  Very frail and pale, hard of hearing  Cardiovascular: RRR  Respiratory: diminished at basis , no wheezing  Abdomen: she does has mild tender on deep palpation around suprapubic region and left lower quadrant of her abdomen, no guarding, no rebound, Soft/ND, positive BS  Musculoskeletal: bilateral ankle pitting Edema has improved  Neuro: alert, oriented x3, slight confusion initially seems has resolved  Data Reviewed: Basic Metabolic Panel: Recent Labs  Lab 05/31/18 0651 06/01/18 0433 06/02/18 0513 06/03/18 0553 06/04/18 0422  NA 135 134* 135 134* 134*  K 4.1 4.3 4.3 4.2 4.4  CL 102 102 101 99 100  CO2 24 27 26 27 26   GLUCOSE 100* 102* 89 81 81  BUN 22 22 26* 23 26*  CREATININE 0.57 0.70 0.51 0.49 0.51  CALCIUM 7.5* 7.5* 7.9* 8.2* 8.2*  MG  --  2.5* 2.6*  --   --    Liver Function Tests: Recent Labs  Lab 05/28/18 1016  AST 9*  ALT 11  ALKPHOS 181*  BILITOT 0.3  PROT 5.8*  ALBUMIN 2.3*   No results for input(s): LIPASE, AMYLASE in the last 168 hours. No results for input(s): AMMONIA in the last 168 hours. CBC: Recent Labs  Lab 05/28/18 1016   05/31/18 0651 06/01/18 0433 06/02/18 0513 06/03/18 0553 06/04/18 0422  WBC 22.2*   < > 15.1* 12.2* 5.1 1.8* 1.0*  NEUTROABS 19.3*  --   --  10.6* 3.4 0.9* 0.3*  HGB 9.9*   < > 8.2* 8.2* 7.7* 8.2* 7.9*  HCT 31.3*   < > 26.1* 25.9* 24.9* 26.1* 25.1*  MCV 86.5   < > 88.5 88.7 90.2 87.9 88.1  PLT 479*   < > 461* 431* 383 396 321   < > = values in this interval not displayed.   Cardiac Enzymes:   No results for input(s): CKTOTAL, CKMB, CKMBINDEX, TROPONINI in the last 168 hours. BNP (last 3 results) Recent Labs    04/28/18 0549  BNP 640.3*    ProBNP (last 3 results) No results for input(s): PROBNP in the last 8760 hours.  CBG: No results for input(s): GLUCAP in the last 168 hours.  Recent Results (from the past 240 hour(s))  Culture, Urine     Status: Abnormal   Collection Time: 05/30/18  5:29 PM  Result Value Ref Range Status   Specimen Description   Final    URINE, CLEAN CATCH Performed at [facility], [place] [address] [place], [place] [address]    Special Requests   Final    NONE Performed at [facility], [place] [address], [place],  [address]    Culture (A)  Final    >=100,000 COLONIES/mL ESCHERICHIA COLI Confirmed Extended Spectrum Beta-Lactamase Producer (ESBL).  In bloodstream infections from ESBL organisms, carbapenems are preferred over piperacillin/tazobactam. They are shown to have a lower risk of mortality.    Report Status 06/02/2018 FINAL  Final   Organism ID, Bacteria ESCHERICHIA COLI (A)  Final      Susceptibility   Escherichia coli - MIC*    AMPICILLIN >=32 RESISTANT Resistant     CEFAZOLIN >=64 RESISTANT Resistant     CEFTRIAXONE >=64 RESISTANT Resistant     CIPROFLOXACIN 0.5 SENSITIVE Sensitive     GENTAMICIN >=16 RESISTANT Resistant     IMIPENEM <=0.25 SENSITIVE Sensitive     NITROFURANTOIN <=16 SENSITIVE Sensitive     TRIMETH/SULFA >=320 RESISTANT Resistant     AMPICILLIN/SULBACTAM 16 INTERMEDIATE  Intermediate     PIP/TAZO <=4 SENSITIVE Sensitive     Extended ESBL POSITIVE Resistant     * >=100,000 COLONIES/mL ESCHERICHIA COLI     Studies: No results found.  Scheduled Meds: . dexamethasone  4 mg Oral Daily  . feeding supplement (ENSURE ENLIVE)  237 mL Oral BID BM  . fentaNYL  25 mcg Transdermal Q72H  . levothyroxine  75 mcg Oral QAC breakfast  . lidocaine  2 patch Transdermal Q24H  . nystatin  5 mL Oral QID  . pantoprazole  40 mg Oral Daily  . polyethylene glycol  17 g Oral BID  . senna  1 tablet Oral BID  . sodium chloride flush  3 mL Intravenous Q12H  . sucralfate  1 g Oral TID WC & HS  . Tbo-filgastrim (GRANIX) SQ  300 mcg Subcutaneous q1800    Continuous Infusions: . meropenem (MERREM) IV 1 g (06/04/18 0215)     Time spent: 39mins, case discussed with cardiology and palliative care I have personally reviewed and interpreted on  06/04/2018 daily labs,  imagings as discussed above under date review session and assessment and plans.  I reviewed all nursing notes, pharmacy notes, consultant notes,  vitals, pertinent old records  I have discussed plan of care as described above with RN , patient and family on 06/04/2018   Florencia Reasons MD, PhD  [facility] Pager [phone]. If 7PM-7AM, please contact night-coverage at [email or web address], password [email or web address] 06/04/2018, 9:26 AM  LOS: 5 days

## 2018-06-04 NOTE — Progress Notes (Signed)
Occupational Therapy Treatment Patient Details Name: [name] [name] MRN: [id number] DOB: Apr 25, 1941 Today's Date: 06/04/2018    History of present illness  77 y.o. female with a hx significant for small cell lung CA w/ metastases to the liver and spine with cord compression on chemo and palliative radiation, HLD, tobacco abuse, hypothyroidism and diverticulosis, who presented from oncologist office due to abdominal pain, increased lethargy and BRBPR.   Patient with an incidental finding of a 1.9 x 1.2 x 1.9 cm apical left.   OT comments  Goal added for BUE AROM/Exercise  Follow Up Recommendations  SNF    Equipment Recommendations  Other (comment)(defer to next venue)    Recommendations for Other Services      Precautions / Restrictions Precautions Precautions: Fall Precaution Comments: pubic ramus fracture in October, mets to spine  Restrictions Weight Bearing Restrictions: No       Mobility Bed Mobility               General bed mobility comments: did not perform  Transfers                 General transfer comment: did not perform        ADL either performed or assessed with clinical judgement   ADL Overall ADL's : Needs assistance/impaired                                       General ADL Comments: Pt declined OOB but did agree to BUE Exercise.  Goal added.     Vision Baseline Vision/History: No visual deficits            Cognition Arousal/Alertness: Awake/alert Behavior During Therapy: WFL for tasks assessed/performed Overall Cognitive Status: Within Functional Limits for tasks assessed                                          Exercises General Exercises - Upper Extremity Shoulder Flexion: AROM;Both;20 reps;Supine Elbow Flexion: AROM;Both;20 reps;Supine Elbow Extension: AROM;Both;20 reps;Supine Wrist Flexion: AROM;Both;20 reps;Supine Wrist Extension: Both;20 reps;AROM;Supine Digit Composite Flexion:  AROM;Both;20 reps;Supine Composite Extension: AROM;Both;Supine;15 reps General Exercises - Lower Extremity Gluteal Sets: (bridges with assist from PT at pelvis and LEs )   Shoulder Instructions       General Comments      Pertinent Vitals/ Pain       Pain Score: 2  Pain Location: generalized Pain Descriptors / Indicators: Sore         Frequency  Min 2X/week        Progress Toward Goals  OT Goals(current goals can now be found in the care plan section)  Progress towards OT goals: Not progressing toward goals - comment(limited participation)     Plan Discharge plan remains appropriate    Co-evaluation                 AM-PAC OT "6 Clicks" Daily Activity     Outcome Measure   Help from another person eating meals?: None Help from another person taking care of personal grooming?: None Help from another person toileting, which includes using toliet, bedpan, or urinal?: Total(incontinent) Help from another person bathing (including washing, rinsing, drying)?: A Lot Help from another person to put on and taking off regular upper body clothing?: A Little Help from another  person to put on and taking off regular lower body clothing?: A Lot 6 Click Score: 16    End of Session    OT Visit Diagnosis: Other abnormalities of gait and mobility (R26.89);Muscle weakness (generalized) (M62.81);Pain Pain - part of body: (generalized)   Activity Tolerance Patient limited by fatigue   Patient Left with call bell/phone within reach;with family/visitor present;in bed   Nurse Communication Mobility status        Time: 1100-1115 OT Time Calculation (min): 15 min  Charges: OT General Charges $OT Visit: 1 Visit  Kari Baars, [place] [phone] Office- [phone], [name] 06/04/2018, 4:44 PM

## 2018-06-04 NOTE — Progress Notes (Signed)
CRITICAL VALUE ALERT  Critical Value:  WBC 1.0  Date & Time Notied:  06/04/18 ;  Provider Notified: Yes  Orders Received/Actions awaiting any new orders

## 2018-06-04 NOTE — Progress Notes (Signed)
Spoke with IV team regarding PICC line that was inserted prior to admission.  Verified that PICC line is able to remain in place for 6 months unless complications noted or ordered otherwise.

## 2018-06-04 NOTE — Progress Notes (Signed)
Palliative Care Progress Note  [name] [name] looks worse today-she is lying on her side-not as interactive-says she feels "awful, hurts all over". Chemo induced pancytopenia. Still unable to ambulate and will likely not be able to regain this ability. Difficulty with bowel and bladder control. She met with Dr. Julien Nordmann yesterday and they discussed further goals of care and offered treatment options. She and her family request time and space to make these decisions. Right now it is very much a QOL issue and her cancer is terminal. They are considering options- today she is miserable and not up for any additional conversation- will help her when she is ready to make decisions.  Pursuing Chemotherapy would mean additional tx with associated side effects, maintaining her current functional status doubt it will dramatically improve-she will not walk again independently, she will require placement in  SNF and need transport to treatments and for follow-up visits. Could be followed by PC.  Pursuing Hospice would mean going home with a team of caregivers who can support her care, expert pain and symptom mangement and she can focus entirely on living her life and  QOL .  In terms of Anticoagulation- since she would need coumadin per cardiology note, this would require transport for INR monitoring and take even more time focuses on the illness vs. living life to the fullest. With chemo induced anemia any bleeding could be devastating-patient decided against anticoagulation under the circumstances and understands risks with or without.  Will continue to follow. Updated her son by phone.  [name] Hacker, DO Palliative Medicine  Time; 35 min Greater than 50%  of this time was spent counseling and coordinating care related to the above assessment and plan.

## 2018-06-04 NOTE — Progress Notes (Signed)
   Spoke w/ Dr Erlinda Hong. No decision on anticoagulation yet.  Cards will see when that decision is made, if needed.  Rosaria Ferries, PA-C 06/04/2018 12:06 PM Beeper [phone]

## 2018-06-05 ENCOUNTER — Inpatient Hospital Stay: Payer: Medicare Other

## 2018-06-05 LAB — BASIC METABOLIC PANEL
Anion gap: 8 (ref 5–15)
BUN: 23 mg/dL (ref 8–23)
CO2: 26 mmol/L (ref 22–32)
CREATININE: 0.45 mg/dL (ref 0.44–1.00)
Calcium: 8 mg/dL — ABNORMAL LOW (ref 8.9–10.3)
Chloride: 98 mmol/L (ref 98–111)
GFR calc Af Amer: >60 mL/min (ref >60)
GFR calc non Af Amer: >60 mL/min (ref >60)
GLUCOSE: 80 mg/dL (ref 70–99)
Potassium: 4.1 mmol/L (ref 3.5–5.1)
Sodium: 132 mmol/L — ABNORMAL LOW (ref 135–145)

## 2018-06-05 LAB — CBC WITH DIFFERENTIAL/PLATELET
HCT: 24.2 % — ABNORMAL LOW (ref 36.0–46.0)
Hemoglobin: 7.6 g/dL — ABNORMAL LOW (ref 12.0–15.0)
MCH: 28.3 pg (ref 26.0–34.0)
MCHC: 31.4 g/dL (ref 30.0–36.0)
MCV: 90 fL (ref 80.0–100.0)
Platelets: 230 10*3/uL (ref 150–400)
RBC: 2.69 MIL/uL — ABNORMAL LOW (ref 3.87–5.11)
RDW: 16.6 % — ABNORMAL HIGH (ref 11.5–15.5)
WBC: 0.7 10*3/uL — CL (ref 4.0–10.5)
nRBC: 0 % (ref 0.0–0.2)

## 2018-06-05 MED ORDER — PREGABALIN 25 MG PO CAPS
25.0000 mg | ORAL_CAPSULE | Freq: Two times a day (BID) | ORAL | Status: DC
  Administered 2018-06-05 – 2018-06-21 (×32): 25 mg via ORAL
  Filled 2018-06-05 (×33): qty 1

## 2018-06-05 MED ORDER — PANTOPRAZOLE SODIUM 40 MG PO TBEC
40.0000 mg | DELAYED_RELEASE_TABLET | Freq: Two times a day (BID) | ORAL | Status: DC
  Administered 2018-06-05 – 2018-06-21 (×31): 40 mg via ORAL
  Filled 2018-06-05 (×31): qty 1

## 2018-06-05 NOTE — Progress Notes (Signed)
PROGRESS NOTE    [name] [name]  GYF:[id number] DOB: [birth year] DOA: 05/30/2018 PCP: Marin Olp, MD   Brief Narrative: Per HPI [name] [name] is a 77 y.o. female with medical history significant for Small Cell Lung Cancer with metastasis to the liver and spine with cord compression on systemic chemotherapy and palliative radiotherapy, hypothyroidism, and diverticulosis who presented to the [facility] long ED from her oncologist office due to abdominal pain, BRBPR seen in the office, and increased lethargy.  Patient's son and husband are at bedside and provide additional history.  Patient states that she has had inability to walk since starting palliative radiotherapy for her T6 metastatic bone disease.  She recently developed abdominal pain but reports regular daily bowel movements normal in appearance until today when she noted bright red blood mixed into her stool.  She reports good urine output without dysuria or hematuria.  She had a prior ESBL E. coli UTI in October 2019.  She also reports cough with new green sputum production earlier today.  She has had night sweats but denies fevers.  She denies chest pain, lightheadedness, or dizziness.  She has had nausea, indigestion, with acid reflux but reports good oral intake recently.  She was found to have apical LV clot as well as emphysematous cystitis with ESBL.   GI now signed off.  Oncology, palliative care, cards following.  Assessment & Plan:   Principal Problem:   Left ventricular thrombus without MI Active Problems:   Hypothyroidism   Spinal cord compression due to malignant neoplasm metastatic to spine (HCC)   Small cell carcinoma of lower lobe of left lung (HCC)   BRBPR (bright red blood per rectum)   Complete lesion at T6 level of thoracic spinal cord (HCC)   Urinary and fecal incontinence   Rectal bleeding   ESBL (extended spectrum beta-lactamase) producing bacteria infection   Emphysematous cystitis   GI bleed with  BRBPR  -She is sent from oncology clinic for admission due to GI bleed and lethargy -No active bleed since in the hospital,  -GI consulted thought bleeding is from anal rectal mucosal bleeding or internal hemorrhoidal bleeding -no blood in stool since in the hospital.  Acute on chronic anemia in the setting of malignancy -no sign of blood loss in the hospital, stool is brown, no visible blood. - Hb fluctuating, but stable, continue to follow - supportive transfusion to keep hgb> 7,   Neutropenia:  ANC was 0.3 on 12/17.  Too few to count 12/18. started GSCF on 12/16  left ventricular thrombus -CT ab report "visualization of a 1.9 x 1.2 x 1.9 cm apical left ventricular clot. This places the patient at increased risk for embolic stroke or other vascular occlusions" -Cardiology consulted, patient is hesitating to start on anticoagulation,  high risk of stroke if not to start on anticoagulation ,will follow up on cardiology recommendation  -Palliative care consulted for goals of care discussion (per palliative care discussion, decided against anticoagulation at this time)  Bilateral ankle pitting edema - seems improved  -venous ultrasound negative for DVT,  -she has been on Decadron since radiation, Decadron could cause edema as well. Pt also with HFpEF. - Holding off on lasix.    Borderline low blood pressure, initial on presentation - a.m. cortisol level very low at 0.8 - Repeat tomorrow - seems improved after started on abx  Emphysematous cystitis, mild right hydronephrosis and hydroureter with mild diffuse uroepithelial thickening and enhancement, compatible with infection. identified on CT  abdomen  On 12/12 -Patient is immunosuppressed, case discussed with infectious disease who recommended treat with antibiotic meropenem for 10 to 14 days --Urine culture + ESBL  E. Coli, continue meropenem (she has a picc line placed two months ago, checked with IV team states picc can stay in  for up to 81months if no complications.)  Extensive stage small cell lung cancer: Widespread metastatic disease to the spine , epidural space and liver  widespread metastatic disease involving numerous thoracic vertebrae including T1, T2, T5, T6, T7, T9, T11, and T12. Pathologic fracture T9. Bulky dorsal epidural tumor at T6 results in significant cord compression.   Per previous neurosurgery recommendation " biopsy shows small cell, no role for surgery"  She completed palliative radiotherapy for T6 cord compression in 04/2018.  She is continued on Decadron 4 mg daily.   The patient is status post cycle 2 days 1 and 2 of carboplatin and etoposide. Day 3 on 12/12 was held due to gi bleed and lethargy, she is sent to the ED and hospitalized on the same day. She did not received Scheduled for PEG filgrastim on 06/01/2018 due to hospitalization.   CT ab/pel obtained in the ED on 12/12 showed:  Musculoskeletal: Bilateral sacral ala fractures are again demonstrated with interval diffuse patchy bone sclerosis and Nonunion.  Also again demonstrated are right inferior pubic ramus and ischial/anterior acetabular fractures. Mild increase in associated callus formation with nonunion.  Interval visualization of a mildly comminuted right superior pubic ramus fracture with patchy bone sclerosis.  Moderate right hip degenerative changes. Lumbar and lower thoracic spine degenerative changes with progressive patchy sclerosis involving multiple lumbar and lower thoracic vertebral bodies.  Severe Indigestion -Patient reports indigestion since started on radiation therapy -Continue PPI  Chronic cancer pain -Continue home meds including fentanyl patch,  -palliative care consulted  COPD /she continues to smoke -No wheezing, no hypoxia at rest  Hypothyroidism, continue Synthroid supplement  Constipation, stool softener  FTT: Very deconditioned and weak, need 2 person assists to get out of bed,  not able to stand up by herself   Goals of Care: appreciate palliative care and oncology.  Pt desires to go back to [facility] when stable for discharge and would like to try 1 more round of chemo.  Will need palliative care to follow at SNF.  DVT prophylaxis: SCD Code Status: full code Family Communication: husband at bedside Disposition Plan: pending further improvement   Consultants:   Oncology  GI  Cardiology  Palliative Care  Procedures:  LE [place] Summary:  Right: There is no evidence of deep vein thrombosis in the lower extremity. However, portions of this examination were limited- see technologist comments above. No cystic structure found in the popliteal fossa.  Left: There is no evidence of deep vein thrombosis in the lower extremity. However, portions of this examination were limited- see technologist comments above. No cystic structure found in the popliteal fossa.   Echo Impressions:  - Echo contrast (definity) used to evaluate for LV thrombus.   The only image suggestive of thrombus is image 102 (screenshot   saved with my annotation with arrow). There is a dense structure   that comes in and out of view at approximately 10-11 o&'clock on   that image. This could align with the mass seen on CT imaging, as   the shape/size are similar. Favor LV thrombus.  Antimicrobials:  Anti-infectives (From admission, onward)   Start     Dose/Rate Route Frequency Ordered Stop  05/31/18 1800  meropenem (MERREM) 1 g in sodium chloride 0.9 % 100 mL IVPB     1 g 200 mL/hr over 30 Minutes Intravenous Every 8 hours 05/31/18 1707       Subjective: Frustrated today. Feels like no ones been telling her what's going on and helping guide her. She wants to go back to [facility] and would like to try 1 more treatment.  Objective: Vitals:   06/04/18 2137 06/05/18 0348 06/05/18 1300 06/05/18 2031  BP: 99/71 125/76 114/69 105/65  Pulse: 81 69 71 83  Resp: 16 16 18 19   Temp:  98.9 F (37.2 C) 98 F (36.7 C) 98.6 F (37 C) 98.2 F (36.8 C)  TempSrc: Oral Oral Oral Oral  SpO2: 92% 94% 96% 93%  Weight:  63.8 kg    Height:        Intake/Output Summary (Last 24 hours) at 06/05/2018 2037 Last data filed at 06/05/2018 1900 Gross per 24 hour  Intake 664.49 ml  Output 700 ml  Net -35.51 ml   Filed Weights   06/01/18 0456 06/04/18 0539 06/05/18 0348  Weight: 65.2 kg 64.4 kg 63.8 kg    Examination:  General exam: Appears calm and comfortable  Respiratory system: Clear to auscultation. Respiratory effort normal. Cardiovascular system: S1 & S2 heard, RRR.  Gastrointestinal system: Abdomen is nondistended, soft and nontender.  Central nervous system: Alert and oriented.  Extremities: no LEE Skin: No rashes, lesions or ulcers Psychiatry: Judgement and insight appear normal. Mood & affect appropriate.     Data Reviewed: I have personally reviewed following labs and imaging studies  CBC: Recent Labs  Lab 06/01/18 0433 06/02/18 0513 06/03/18 0553 06/04/18 0422 06/05/18 0551  WBC 12.2* 5.1 1.8* 1.0* 0.7*  NEUTROABS 10.6* 3.4 0.9* 0.3*  --   HGB 8.2* 7.7* 8.2* 7.9* 7.6*  HCT 25.9* 24.9* 26.1* 25.1* 24.2*  MCV 88.7 90.2 87.9 88.1 90.0  PLT 431* 383 396 321 338   Basic Metabolic Panel: Recent Labs  Lab 06/01/18 0433 06/02/18 0513 06/03/18 0553 06/04/18 0422 06/05/18 0551  NA 134* 135 134* 134* 132*  K 4.3 4.3 4.2 4.4 4.1  CL 102 101 99 100 98  CO2 27 26 27 26 26   GLUCOSE 102* 89 81 81 80  BUN 22 26* 23 26* 23  CREATININE 0.70 0.51 0.49 0.51 0.45  CALCIUM 7.5* 7.9* 8.2* 8.2* 8.0*  MG 2.5* 2.6*  --   --   --    GFR: Estimated Creatinine Clearance: 51.3 mL/min (by C-G formula based on SCr of 0.45 mg/dL). Liver Function Tests: No results for input(s): AST, ALT, ALKPHOS, BILITOT, PROT, ALBUMIN in the last 168 hours. No results for input(s): LIPASE, AMYLASE in the last 168 hours. No results for input(s): AMMONIA in the last 168  hours. Coagulation Profile: No results for input(s): INR, PROTIME in the last 168 hours. Cardiac Enzymes: No results for input(s): CKTOTAL, CKMB, CKMBINDEX, TROPONINI in the last 168 hours. BNP (last 3 results) No results for input(s): PROBNP in the last 8760 hours. HbA1C: No results for input(s): HGBA1C in the last 72 hours. CBG: No results for input(s): GLUCAP in the last 168 hours. Lipid Profile: No results for input(s): CHOL, HDL, LDLCALC, TRIG, CHOLHDL, LDLDIRECT in the last 72 hours. Thyroid Function Tests: No results for input(s): TSH, T4TOTAL, FREET4, T3FREE, THYROIDAB in the last 72 hours. Anemia Panel: No results for input(s): VITAMINB12, FOLATE, FERRITIN, TIBC, IRON, RETICCTPCT in the last 72 hours. Sepsis Labs: Recent Labs  Lab 05/30/18 1841 05/30/18 2051 06/01/18 0433  LATICACIDVEN 1.87 2.35* 1.5    Recent Results (from the past 240 hour(s))  Culture, Urine     Status: Abnormal   Collection Time: 05/30/18  5:29 PM  Result Value Ref Range Status   Specimen Description   Final    URINE, CLEAN CATCH Performed at [facility], [place] [address], [place], [place] [address]    Special Requests   Final    NONE Performed at [facility], [place] [address], [place],  [address]    Culture (A)  Final    >=100,000 COLONIES/mL ESCHERICHIA COLI Confirmed Extended Spectrum Beta-Lactamase Producer (ESBL).  In bloodstream infections from ESBL organisms, carbapenems are preferred over piperacillin/tazobactam. They are shown to have a lower risk of mortality.    Report Status 06/02/2018 FINAL  Final   Organism ID, Bacteria ESCHERICHIA COLI (A)  Final      Susceptibility   Escherichia coli - MIC*    AMPICILLIN >=32 RESISTANT Resistant     CEFAZOLIN >=64 RESISTANT Resistant     CEFTRIAXONE >=64 RESISTANT Resistant     CIPROFLOXACIN 0.5 SENSITIVE Sensitive     GENTAMICIN >=16 RESISTANT Resistant     IMIPENEM <=0.25 SENSITIVE Sensitive      NITROFURANTOIN <=16 SENSITIVE Sensitive     TRIMETH/SULFA >=320 RESISTANT Resistant     AMPICILLIN/SULBACTAM 16 INTERMEDIATE Intermediate     PIP/TAZO <=4 SENSITIVE Sensitive     Extended ESBL POSITIVE Resistant     * >=100,000 COLONIES/mL ESCHERICHIA COLI         Radiology Studies: No results found.      Scheduled Meds: . dexamethasone  4 mg Oral Daily  . feeding supplement (ENSURE ENLIVE)  237 mL Oral BID BM  . fentaNYL  25 mcg Transdermal Q72H  . levothyroxine  75 mcg Oral QAC breakfast  . lidocaine  2 patch Transdermal Q24H  . nystatin  5 mL Oral QID  . pantoprazole  40 mg Oral BID WC  . polyethylene glycol  17 g Oral BID  . pregabalin  25 mg Oral BID  . senna  1 tablet Oral BID  . sodium chloride flush  3 mL Intravenous Q12H  . sucralfate  1 g Oral TID WC & HS  . Tbo-filgastrim (GRANIX) SQ  300 mcg Subcutaneous q1800   Continuous Infusions: . meropenem (MERREM) IV 1 g (06/05/18 1641)     LOS: 6 days    Time spent: over 30 min    Fayrene Helper, MD [facility] Pager [phone]  If 7PM-7AM, please contact night-coverage [email or web address] Password [facility] 06/05/2018, 8:37 PM

## 2018-06-05 NOTE — Progress Notes (Signed)
PT Cancellation Note  Patient Details Name: [name] [name] MRN: [id number] DOB: 09-15-1940   Cancelled Treatment:    Reason Eval/Treat Not Completed: Pain limiting ability to participate;Fatigue/lethargy limiting ability to participate - Pt upset with course of treatments at this time, MD and RN rounding in room. Pt limited by fatigue, heartburn, and nausea as well. PT to continue to follow acutely.  Julien Girt, PT Acute Rehabilitation Services Pager [phone]  Office [phone]    Roxine Caddy D [name] 06/05/2018, 12:18 PM

## 2018-06-06 LAB — CBC WITH DIFFERENTIAL/PLATELET
Abs Immature Granulocytes: 0 10*3/uL (ref 0.00–0.07)
Basophils Absolute: 0 10*3/uL (ref 0.0–0.1)
Basophils Relative: 0 %
Eosinophils Absolute: 0 10*3/uL (ref 0.0–0.5)
Eosinophils Relative: 1 %
HEMATOCRIT: 23.8 % — AB (ref 36.0–46.0)
Hemoglobin: 7.4 g/dL — ABNORMAL LOW (ref 12.0–15.0)
Immature Granulocytes: 0 %
Lymphocytes Relative: 65 %
Lymphs Abs: 0.5 10*3/uL — ABNORMAL LOW (ref 0.7–4.0)
MCH: 27.6 pg (ref 26.0–34.0)
MCHC: 31.1 g/dL (ref 30.0–36.0)
MCV: 88.8 fL (ref 80.0–100.0)
Monocytes Absolute: 0.1 10*3/uL (ref 0.1–1.0)
Monocytes Relative: 16 %
Neutro Abs: 0.1 10*3/uL — ABNORMAL LOW (ref 1.7–7.7)
Neutrophils Relative %: 18 %
Platelets: 210 10*3/uL (ref 150–400)
RBC: 2.68 MIL/uL — ABNORMAL LOW (ref 3.87–5.11)
RDW: 16.7 % — ABNORMAL HIGH (ref 11.5–15.5)
WBC: 0.7 10*3/uL — CL (ref 4.0–10.5)
nRBC: 0 % (ref 0.0–0.2)

## 2018-06-06 LAB — COMPREHENSIVE METABOLIC PANEL
ALT: 22 U/L (ref 0–44)
AST: 12 U/L — ABNORMAL LOW (ref 15–41)
Albumin: 2.4 g/dL — ABNORMAL LOW (ref 3.5–5.0)
Alkaline Phosphatase: 128 U/L — ABNORMAL HIGH (ref 38–126)
Anion gap: 6 (ref 5–15)
BUN: 27 mg/dL — ABNORMAL HIGH (ref 8–23)
CO2: 27 mmol/L (ref 22–32)
Calcium: 8.2 mg/dL — ABNORMAL LOW (ref 8.9–10.3)
Chloride: 101 mmol/L (ref 98–111)
Creatinine, Ser: 0.47 mg/dL (ref 0.44–1.00)
GFR calc Af Amer: >60 mL/min (ref >60)
GFR calc non Af Amer: >60 mL/min (ref >60)
Glucose, Bld: 86 mg/dL (ref 70–99)
Potassium: 4.3 mmol/L (ref 3.5–5.1)
SODIUM: 134 mmol/L — AB (ref 135–145)
Total Bilirubin: 0.9 mg/dL (ref 0.3–1.2)
Total Protein: 5 g/dL — ABNORMAL LOW (ref 6.5–8.1)

## 2018-06-06 LAB — CORTISOL: Cortisol, Plasma: 0.7 ug/dL

## 2018-06-06 MED ORDER — TRAZODONE HCL 50 MG PO TABS: 50.0000 mg | ORAL_TABLET | Freq: Every evening | ORAL | Status: DC | PRN

## 2018-06-06 NOTE — Progress Notes (Signed)
PROGRESS NOTE    [name] [name]  OAC:[id number] DOB: [birth year] DOA: 05/30/2018 PCP: Marin Olp, MD   Brief Narrative: Per HPI [name] [name] is [name] [name] 77 y.o. female with medical history significant for Small Cell Lung Cancer with metastasis to the liver and spine with cord compression on systemic chemotherapy and palliative radiotherapy, hypothyroidism, and diverticulosis who presented to the [facility] long ED from her oncologist office due to abdominal pain, BRBPR seen in the office, and increased lethargy.  Patient's son and husband are at bedside and provide additional history.  Patient states that she has had inability to walk since starting palliative radiotherapy for her T6 metastatic bone disease.  She recently developed abdominal pain but reports regular daily bowel movements normal in appearance until today when she noted bright red blood mixed into her stool.  She reports good urine output without dysuria or hematuria.  She had [name] [name] prior ESBL E. coli UTI in October 2019.  She also reports cough with new green sputum production earlier today.  She has had night sweats but denies fevers.  She denies chest pain, lightheadedness, or dizziness.  She has had nausea, indigestion, with acid reflux but reports good oral intake recently.  She was found to have apical LV clot as well as emphysematous cystitis with ESBL.   GI now signed off.  Oncology, palliative care, cards following.  Assessment & Plan:   Principal Problem:   Left ventricular thrombus without MI Active Problems:   Hypothyroidism   Spinal cord compression due to malignant neoplasm metastatic to spine (HCC)   Small cell carcinoma of lower lobe of left lung (HCC)   BRBPR (bright red blood per rectum)   Complete lesion at T6 level of thoracic spinal cord (HCC)   Urinary and fecal incontinence   Rectal bleeding   ESBL (extended spectrum beta-lactamase) producing bacteria infection   Emphysematous cystitis   GI bleed with  BRBPR  -She is sent from oncology clinic for admission due to GI bleed and lethargy -No active bleed since in the hospital,  -GI consulted thought bleeding is from anal rectal mucosal bleeding or internal hemorrhoidal bleeding -no blood in stool since in the hospital.  Acute on chronic anemia in the setting of malignancy -no sign of blood loss in the hospital, stool is brown, no visible blood. - Hb fluctuating, but stable, continue to follow - supportive transfusion to keep hgb> 7,   Neutropenia:  ANC was 0.3 on 12/17.  Too few to count 12/18.  ANC 0.1 on 12/19.  started GSCF on 12/16.  Continue to follow closely.  left ventricular thrombus -CT ab report "visualization of [name] [name] 1.9 x 1.2 x 1.9 cm apical left ventricular clot. This places the patient at increased risk for embolic stroke or other vascular occlusions" -Cardiology consulted, patient is hesitating to start on anticoagulation,  high risk of stroke if not to start on anticoagulation ,will follow up on cardiology recommendation  -Palliative care consulted for goals of care discussion (per palliative care discussion, decided against anticoagulation at this time - though on discussion today, sounds like eliquis might be acceptable option, will discuss with pt, continue to follow H/H)  Bilateral ankle pitting edema - seems improved  -venous ultrasound negative for DVT,  -she has been on Decadron since radiation, Decadron could cause edema as well. Pt also with HFpEF. - Holding off on lasix.    Borderline low blood pressure, initial on presentation - AM cortisol suppressed, pt on chronic dexamethasone  -  seems improved after started on abx  Emphysematous cystitis, mild right hydronephrosis and hydroureter with mild diffuse uroepithelial thickening and enhancement, compatible with infection. identified on CT abdomen  On 12/12 -Patient is immunosuppressed, case discussed with infectious disease who recommended treat with antibiotic  meropenem for 10 to 14 days --Urine culture + ESBL  E. Coli, continue meropenem (she has [name] [name] picc line placed two months ago, checked with IV team states picc can stay in for up to 32months if no complications.)  Extensive stage small cell lung cancer: Widespread metastatic disease to the spine , epidural space and liver  widespread metastatic disease involving numerous thoracic vertebrae including T1, T2, T5, T6, T7, T9, T11, and T12. Pathologic fracture T9. Bulky dorsal epidural tumor at T6 results in significant cord compression.   Per previous neurosurgery recommendation " biopsy shows small cell, no role for surgery"  She completed palliative radiotherapy for T6 cord compression in 04/2018.  She is continued on Decadron 4 mg daily.   The patient is status post cycle 2 days 1 and 2 of carboplatin and etoposide. Day 3 on 12/12 was held due to gi bleed and lethargy, she is sent to the ED and hospitalized on the same day. She did not received Scheduled for PEG filgrastim on 06/01/2018 due to hospitalization.   CT ab/pel obtained in the ED on 12/12 showed:  Musculoskeletal: Bilateral sacral ala fractures are again demonstrated with interval diffuse patchy bone sclerosis and Nonunion.  Also again demonstrated are right inferior pubic ramus and ischial/anterior acetabular fractures. Mild increase in associated callus formation with nonunion.  Interval visualization of [name] [name] mildly comminuted right superior pubic ramus fracture with patchy bone sclerosis.  Moderate right hip degenerative changes. Lumbar and lower thoracic spine degenerative changes with progressive patchy sclerosis involving multiple lumbar and lower thoracic vertebral bodies.  Severe Indigestion -Patient reports indigestion since started on radiation therapy -Continue PPI  Chronic cancer pain -Continue home meds including fentanyl patch,  -palliative care consulted  COPD /she continues to smoke -No wheezing, no hypoxia  at rest  Hypothyroidism, continue Synthroid supplement  Constipation, stool softener  FTT: Very deconditioned and weak, need 2 person assists to get out of bed, not able to stand up by herself   Goals of Care: appreciate palliative care and oncology.  Pt desires to go back to [facility] when stable for discharge and would like to try 1 more round of chemo.  Will need palliative care to follow at SNF.  DVT prophylaxis: SCD Code Status: full code Family Communication: friend at bedside Disposition Plan: pending further improvement   Consultants:   Oncology  GI  Cardiology  Palliative Care  Procedures:  LE [place] Summary:  Right: There is no evidence of deep vein thrombosis in the lower extremity. However, portions of this examination were limited- see technologist comments above. No cystic structure found in the popliteal fossa.  Left: There is no evidence of deep vein thrombosis in the lower extremity. However, portions of this examination were limited- see technologist comments above. No cystic structure found in the popliteal fossa.   Echo Impressions:  - Echo contrast (definity) used to evaluate for LV thrombus.   The only image suggestive of thrombus is image 56 (screenshot   saved with my annotation with arrow). There is Mahkayla Preece dense structure   that comes in and out of view at approximately 10-11 o&'clock on   that image. This could align with the mass seen on CT imaging, as  the shape/size are similar. Favor LV thrombus.  Antimicrobials:  Anti-infectives (From admission, onward)   Start     Dose/Rate Route Frequency Ordered Stop   05/31/18 1800  meropenem (MERREM) 1 g in sodium chloride 0.9 % 100 mL IVPB     1 g 200 mL/hr over 30 Minutes Intravenous Every 8 hours 05/31/18 1707       Subjective: Wants to go back to [facility]. Still frustrated. Friend at bedside today.  Objective: Vitals:   06/05/18 1300 06/05/18 2031 06/06/18 0628 06/06/18 0634  BP:  114/69 105/65 123/78   Pulse: 71 83 74   Resp: 18 19 20    Temp: 98.6 F (37 C) 98.2 F (36.8 C) 97.9 F (36.6 C)   TempSrc: Oral Oral Oral   SpO2: 96% 93% 92%   Weight:    64.8 kg  Height:        Intake/Output Summary (Last 24 hours) at 06/06/2018 1906 Last data filed at 06/06/2018 1700 Gross per 24 hour  Intake 460 ml  Output -  Net 460 ml   Filed Weights   06/04/18 0539 06/05/18 0348 06/06/18 0634  Weight: 64.4 kg 63.8 kg 64.8 kg    Examination:  General: No acute distress. Cardiovascular: Heart sounds show Christalynn Boise regular rate, and rhythm. Lungs: Clear to auscultation bilaterally with good air movement. No rales, rhonchi or wheezes. Abdomen: Soft, mildly ttp, nondistended Neurological: Alert and oriented 3. Moves all extremities 4. Cranial nerves II through XII grossly intact. Skin: Warm and dry. No rashes or lesions. Extremities: No clubbing or cyanosis. No edema.  Psychiatric: Mood and affect are normal. Insight and judgment are appropriate.   Data Reviewed: I have personally reviewed following labs and imaging studies  CBC: Recent Labs  Lab 06/01/18 0433 06/02/18 0513 06/03/18 0553 06/04/18 0422 06/05/18 0551 06/06/18 0435  WBC 12.2* 5.1 1.8* 1.0* 0.7* 0.7*  NEUTROABS 10.6* 3.4 0.9* 0.3*  --  0.1*  HGB 8.2* 7.7* 8.2* 7.9* 7.6* 7.4*  HCT 25.9* 24.9* 26.1* 25.1* 24.2* 23.8*  MCV 88.7 90.2 87.9 88.1 90.0 88.8  PLT 431* 383 396 321 230 034   Basic Metabolic Panel: Recent Labs  Lab 06/01/18 0433 06/02/18 0513 06/03/18 0553 06/04/18 0422 06/05/18 0551 06/06/18 0435  NA 134* 135 134* 134* 132* 134*  K 4.3 4.3 4.2 4.4 4.1 4.3  CL 102 101 99 100 98 101  CO2 27 26 27 26 26 27   GLUCOSE 102* 89 81 81 80 86  BUN 22 26* 23 26* 23 27*  CREATININE 0.70 0.51 0.49 0.51 0.45 0.47  CALCIUM 7.5* 7.9* 8.2* 8.2* 8.0* 8.2*  MG 2.5* 2.6*  --   --   --   --    GFR: Estimated Creatinine Clearance: 51.3 mL/min (by C-G formula based on SCr of 0.47 mg/dL). Liver  Function Tests: Recent Labs  Lab 06/06/18 0435  AST 12*  ALT 22  ALKPHOS 128*  BILITOT 0.9  PROT 5.0*  ALBUMIN 2.4*   No results for input(s): LIPASE, AMYLASE in the last 168 hours. No results for input(s): AMMONIA in the last 168 hours. Coagulation Profile: No results for input(s): INR, PROTIME in the last 168 hours. Cardiac Enzymes: No results for input(s): CKTOTAL, CKMB, CKMBINDEX, TROPONINI in the last 168 hours. BNP (last 3 results) No results for input(s): PROBNP in the last 8760 hours. HbA1C: No results for input(s): HGBA1C in the last 72 hours. CBG: No results for input(s): GLUCAP in the last 168 hours. Lipid Profile: No  results for input(s): CHOL, HDL, LDLCALC, TRIG, CHOLHDL, LDLDIRECT in the last 72 hours. Thyroid Function Tests: No results for input(s): TSH, T4TOTAL, FREET4, T3FREE, THYROIDAB in the last 72 hours. Anemia Panel: No results for input(s): VITAMINB12, FOLATE, FERRITIN, TIBC, IRON, RETICCTPCT in the last 72 hours. Sepsis Labs: Recent Labs  Lab 05/30/18 2051 06/01/18 0433  LATICACIDVEN 2.35* 1.5    Recent Results (from the past 240 hour(s))  Culture, Urine     Status: Abnormal   Collection Time: 05/30/18  5:29 PM  Result Value Ref Range Status   Specimen Description   Final    URINE, CLEAN CATCH Performed at [facility] [address], [place], [place] [address]    Special Requests   Final    NONE Performed at [facility], [place] [address], [place], [place] [address]    Culture (Jacquelinne Speak)  Final    >=100,000 COLONIES/mL ESCHERICHIA COLI Confirmed Extended Spectrum Beta-Lactamase Producer (ESBL).  In bloodstream infections from ESBL organisms, carbapenems are preferred over piperacillin/tazobactam. They are shown to have [place] lower risk of mortality.    Report Status 06/02/2018 FINAL  Final   Organism ID, Bacteria ESCHERICHIA COLI (Saaya Procell)  Final      Susceptibility   Escherichia coli - MIC*    AMPICILLIN >=32  RESISTANT Resistant     CEFAZOLIN >=64 RESISTANT Resistant     CEFTRIAXONE >=64 RESISTANT Resistant     CIPROFLOXACIN 0.5 SENSITIVE Sensitive     GENTAMICIN >=16 RESISTANT Resistant     IMIPENEM <=0.25 SENSITIVE Sensitive     NITROFURANTOIN <=16 SENSITIVE Sensitive     TRIMETH/SULFA >=320 RESISTANT Resistant     AMPICILLIN/SULBACTAM 16 INTERMEDIATE Intermediate     PIP/TAZO <=4 SENSITIVE Sensitive     Extended ESBL POSITIVE Resistant     * >=100,000 COLONIES/mL ESCHERICHIA COLI         Radiology Studies: No results found.      Scheduled Meds: . dexamethasone  4 mg Oral Daily  . feeding supplement (ENSURE ENLIVE)  237 mL Oral BID BM  . fentaNYL  25 mcg Transdermal Q72H  . levothyroxine  75 mcg Oral QAC breakfast  . lidocaine  2 patch Transdermal Q24H  . nystatin  5 mL Oral QID  . pantoprazole  40 mg Oral BID WC  . polyethylene glycol  17 g Oral BID  . pregabalin  25 mg Oral BID  . senna  1 tablet Oral BID  . sodium chloride flush  3 mL Intravenous Q12H  . sucralfate  1 g Oral TID WC & HS   Continuous Infusions: . meropenem (MERREM) IV 1 g (06/06/18 1631)     LOS: 7 days    Time spent: over 30 min    Fayrene Helper, MD [facility] Pager [phone]  If 7PM-7AM, please contact night-coverage [email or web address] Password [email or web address] 06/06/2018, 7:06 PM

## 2018-06-06 NOTE — Progress Notes (Signed)
Physical Therapy Treatment Patient Details Name: [name] [name] MRN: [id number] DOB: Dec 24, 1940 Today's Date: 06/06/2018    History of Present Illness  77 y.o. female with a hx significant for small cell lung CA w/ metastases to the liver and spine with cord compression on chemo and palliative radiation, HLD, tobacco abuse, hypothyroidism and diverticulosis, who presented from oncologist office due to abdominal pain, increased lethargy and BRBPR.   Patient with an incidental finding of a 1.9 x 1.2 x 1.9 cm apical left.    PT Comments    Pt min guard to min assist for bed mobility and performed LE exercises well. Pt motivated to mobilize, but pt limited by pain, nausea, fatigue, and dizziness this session. Pt requires increased time for mobility. Pt is adamant that she wants to return to [facility]. PT to continue to follow acutely.     Follow Up Recommendations  Supervision/Assistance - 24 hour;SNF     Equipment Recommendations  None recommended by PT    Recommendations for Other Services       Precautions / Restrictions Precautions Precautions: Fall Precaution Comments: pubic ramus fracture in October, mets to spine  Restrictions Weight Bearing Restrictions: No Other Position/Activity Restrictions: WBAT    Mobility  Bed Mobility Overal bed mobility: Needs Assistance Bed Mobility: Rolling;Sit to Supine;Supine to Sit Rolling: Min assist   Supine to sit: Min guard;HOB elevated Sit to supine: Min assist;HOB elevated   General bed mobility comments: Min assist for completion of bilateral rolling during pericare (pt soiled in feces and urine). Min guard for supine to sit with increased time. Pt with dizziness and nausea upon sitting EOB, Pt quickly returned to supine position with min assist for LE management. Pt mod I with scooting self up in bed with use of bedrails. Subsided with rest.   Transfers Overall transfer level: (NT - pt dizzy and nauseous upon sitting EOB and  needed to return to supine position)                  Ambulation/Gait                 Stairs             Wheelchair Mobility    Modified Rankin (Stroke Patients Only)       Balance Overall balance assessment: Needs assistance;History of Falls Sitting-balance support: Feet supported Sitting balance-Leahy Scale: Fair Sitting balance - Comments: relies on BUE support   Standing balance support: (NT )                                Cognition Arousal/Alertness: Awake/alert Behavior During Therapy: WFL for tasks assessed/performed Overall Cognitive Status: Within Functional Limits for tasks assessed                                        Exercises General Exercises - Lower Extremity Ankle Circles/Pumps: AROM;Both;20 reps;Supine Quad Sets: AROM;Both;10 reps;Supine Heel Slides: AAROM;Both;10 reps;Supine    General Comments        Pertinent Vitals/Pain Pain Assessment: 0-10 Faces Pain Scale: Hurts worst Pain Location: generalized, and gastric reflux  Pain Descriptors / Indicators: Discomfort Pain Intervention(s): Limited activity within patient's tolerance;Repositioned;Monitored during session    Home Living  Prior Function            PT Goals (current goals can now be found in the care plan section) Acute Rehab PT Goals Patient Stated Goal: return to rehab PT Goal Formulation: With patient Time For Goal Achievement: 06/14/18 Potential to Achieve Goals: Fair Progress towards PT goals: Progressing toward goals    Frequency    Min 2X/week      PT Plan Current plan remains appropriate    Co-evaluation              AM-PAC PT "6 Clicks" Mobility   Outcome Measure  Help needed turning from your back to your side while in a flat bed without using bedrails?: A Little Help needed moving from lying on your back to sitting on the side of a flat bed without using bedrails?: A  Lot Help needed moving to and from a bed to a chair (including a wheelchair)?: A Lot Help needed standing up from a chair using your arms (e.g., wheelchair or bedside chair)?: A Lot Help needed to walk in hospital room?: A Lot Help needed climbing 3-5 steps with a railing? : Total 6 Click Score: 12    End of Session   Activity Tolerance: Patient limited by fatigue;Other (comment)(pt limited by dizziness, nausea ) Patient left: in bed;with call bell/phone within reach;with bed alarm set;with family/visitor present Nurse Communication: Mobility status;Patient requests pain meds PT Visit Diagnosis: Muscle weakness (generalized) (M62.81);Other abnormalities of gait and mobility (R26.89)     Time: 4801-6553 PT Time Calculation (min) (ACUTE ONLY): 33 min  Charges:  $Therapeutic Exercise: 8-22 mins $Therapeutic Activity: 8-22 mins                    Kaydince Towles Conception Chancy, PT Acute Rehabilitation Services Pager [phone]  Office [phone]    Lileigh Fahringer D [name] 06/06/2018, 4:37 PM

## 2018-06-06 NOTE — Progress Notes (Signed)
Pharmacy Antibiotic Note  [name] [name] is a 77 y.o. female admitted on 05/30/2018 with GI bleeding, LA thrombus in setting of metastatic Lung CA.  She is starting on antibiotics for emphysematous cystitis.  Only "symptom" is suprapubic tenderness on deep palpation that could be related to cystitis vs superior pubic ramus fracture.  Urinary catheter in place.  She has a hx of ESBL-producing Ecoli in urine.  Pharmacy has been consulted for Meropenem dosing.  06/06/2018:  Day 6 Meropenem  WBC 0.7/ANC 0.1 - note patient last received chemo on 12/11 currently on Granix  Renal function at patient's baseline  Afebrile  Plan: 1) Continue Meropenem 1g IV q8h for 10 days per ID recommendation  2) Monitor renal function and cx data    Height: 5\' 5"  (165.1 cm) Weight: 142 lb 14.4 oz (64.8 kg) IBW/kg (Calculated) : 57  Temp (24hrs), Avg:98.2 F (36.8 C), Min:97.9 F (36.6 C), Max:98.6 F (37 C)  Recent Labs  Lab 05/30/18 1841 05/30/18 2051  06/01/18 0433 06/02/18 0513 06/03/18 0553 06/04/18 0422 06/05/18 0551 06/06/18 0435  WBC  --   --    < > 12.2* 5.1 1.8* 1.0* 0.7* 0.7*  CREATININE  --   --    < > 0.70 0.51 0.49 0.51 0.45 0.47  LATICACIDVEN 1.87 2.35*  --  1.5  --   --   --   --   --    < > = values in this interval not displayed.    Estimated Creatinine Clearance: 51.3 mL/min (by C-G formula based on SCr of 0.47 mg/dL).    Allergies  Allergen Reactions  . Atorvastatin     Intolerant to all statins   . Cyclobenzaprine Other (See Comments)    Bradycardia, hypotension  . Ezetimibe      INTOL to Zetia  . Rosuvastatin     intolerant to all statins  . Gadolinium Derivatives Nausea Only    Pt became very nauseous after gadolinium administration//lh  . Nicoderm [Nicotine] Rash    Unable to use the patches---caused severe rash to area placed    Antimicrobials this admission: 12/13 Meropenem >>   Dose adjustments this admission:  Microbiology results: 12/12 UCx:   ESBL Ecoli  Previous cx data 04/01/18 UCx: ESBL Ecoli   Thank you for allowing pharmacy to be a part of this patient's care.  Biagio Borg 06/06/2018 8:41 AM

## 2018-06-07 LAB — BASIC METABOLIC PANEL
Anion gap: 9 (ref 5–15)
BUN: 25 mg/dL — ABNORMAL HIGH (ref 8–23)
CO2: 26 mmol/L (ref 22–32)
Calcium: 8.4 mg/dL — ABNORMAL LOW (ref 8.9–10.3)
Chloride: 98 mmol/L (ref 98–111)
Creatinine, Ser: 0.69 mg/dL (ref 0.44–1.00)
GFR calc Af Amer: >60 mL/min (ref >60)
GFR calc non Af Amer: >60 mL/min (ref >60)
Glucose, Bld: 135 mg/dL — ABNORMAL HIGH (ref 70–99)
Potassium: 3.9 mmol/L (ref 3.5–5.1)
SODIUM: 133 mmol/L — AB (ref 135–145)

## 2018-06-07 LAB — CBC
HCT: 24.7 % — ABNORMAL LOW (ref 36.0–46.0)
Hemoglobin: 7.7 g/dL — ABNORMAL LOW (ref 12.0–15.0)
MCH: 28.6 pg (ref 26.0–34.0)
MCHC: 31.2 g/dL (ref 30.0–36.0)
MCV: 91.8 fL (ref 80.0–100.0)
Platelets: 177 10*3/uL (ref 150–400)
RBC: 2.69 MIL/uL — ABNORMAL LOW (ref 3.87–5.11)
RDW: 17.3 % — ABNORMAL HIGH (ref 11.5–15.5)
WBC: 6.4 10*3/uL (ref 4.0–10.5)
nRBC: 1.6 % — ABNORMAL HIGH (ref 0.0–0.2)

## 2018-06-07 LAB — CBC WITH DIFFERENTIAL/PLATELET
Abs Immature Granulocytes: 0.14 10*3/uL — ABNORMAL HIGH (ref 0.00–0.07)
Basophils Absolute: 0 10*3/uL (ref 0.0–0.1)
Basophils Relative: 0 %
EOS PCT: 0 %
Eosinophils Absolute: 0 10*3/uL (ref 0.0–0.5)
HCT: 24.7 % — ABNORMAL LOW (ref 36.0–46.0)
Hemoglobin: 7.6 g/dL — ABNORMAL LOW (ref 12.0–15.0)
IMMATURE GRANULOCYTES: 5 %
LYMPHS PCT: 30 %
Lymphs Abs: 0.8 10*3/uL (ref 0.7–4.0)
MCH: 28 pg (ref 26.0–34.0)
MCHC: 30.8 g/dL (ref 30.0–36.0)
MCV: 91.1 fL (ref 80.0–100.0)
Monocytes Absolute: 0.3 10*3/uL (ref 0.1–1.0)
Monocytes Relative: 12 %
Neutro Abs: 1.5 10*3/uL — ABNORMAL LOW (ref 1.7–7.7)
Neutrophils Relative %: 53 %
Platelets: 180 10*3/uL (ref 150–400)
RBC: 2.71 MIL/uL — ABNORMAL LOW (ref 3.87–5.11)
RDW: 17 % — ABNORMAL HIGH (ref 11.5–15.5)
WBC: 2.8 10*3/uL — ABNORMAL LOW (ref 4.0–10.5)
nRBC: 2.9 % — ABNORMAL HIGH (ref 0.0–0.2)

## 2018-06-07 LAB — COMPREHENSIVE METABOLIC PANEL
ALT: 20 U/L (ref 0–44)
AST: 12 U/L — ABNORMAL LOW (ref 15–41)
Albumin: 2.4 g/dL — ABNORMAL LOW (ref 3.5–5.0)
Alkaline Phosphatase: 150 U/L — ABNORMAL HIGH (ref 38–126)
Anion gap: 9 (ref 5–15)
BILIRUBIN TOTAL: 0.2 mg/dL — AB (ref 0.3–1.2)
BUN: 24 mg/dL — ABNORMAL HIGH (ref 8–23)
CO2: 24 mmol/L (ref 22–32)
Calcium: 8.4 mg/dL — ABNORMAL LOW (ref 8.9–10.3)
Chloride: 102 mmol/L (ref 98–111)
Creatinine, Ser: 0.48 mg/dL (ref 0.44–1.00)
GFR calc Af Amer: >60 mL/min (ref >60)
Glucose, Bld: 83 mg/dL (ref 70–99)
Potassium: 4.2 mmol/L (ref 3.5–5.1)
Sodium: 135 mmol/L (ref 135–145)
TOTAL PROTEIN: 5.2 g/dL — AB (ref 6.5–8.1)

## 2018-06-07 LAB — LACTIC ACID, PLASMA: Lactic Acid, Venous: 1.9 mmol/L (ref 0.5–1.9)

## 2018-06-07 LAB — MAGNESIUM: MAGNESIUM: 2.4 mg/dL (ref 1.7–2.4)

## 2018-06-07 MED ORDER — SODIUM CHLORIDE 0.9 % IV SOLN
1.0000 g | INTRAVENOUS | Status: AC
  Administered 2018-06-07 – 2018-06-10 (×4): 1000 mg via INTRAVENOUS
  Filled 2018-06-07 (×4): qty 1

## 2018-06-07 MED ORDER — APIXABAN 5 MG PO TABS: 5.0000 mg | ORAL_TABLET | Freq: Two times a day (BID) | ORAL | Status: DC

## 2018-06-07 MED ORDER — APIXABAN 5 MG PO TABS
5.0000 mg | ORAL_TABLET | Freq: Two times a day (BID) | ORAL | Status: DC
  Administered 2018-06-07 – 2018-06-21 (×27): 5 mg via ORAL
  Filled 2018-06-07 (×28): qty 1

## 2018-06-07 MED ORDER — LACTATED RINGERS IV BOLUS
1000.0000 mL | Freq: Once | INTRAVENOUS | Status: AC
  Administered 2018-06-07: 1000 mL via INTRAVENOUS

## 2018-06-07 MED ORDER — FENTANYL 25 MCG/HR TD PT72: 25.0000 ug | MEDICATED_PATCH | TRANSDERMAL | Status: DC

## 2018-06-07 MED ORDER — FENTANYL 12 MCG/HR TD PT72: 12.5000 ug | MEDICATED_PATCH | TRANSDERMAL | Status: DC

## 2018-06-07 NOTE — Progress Notes (Addendum)
Progress Note  Patient Name: [name] [name] Date of Encounter: 06/07/2018  Primary Cardiologist: Sanda Klein, MD   Subjective   No chest pain or shortness of breath. Pt wants information on risks and benefits of various forms of anticoagulation  Inpatient Medications    Scheduled Meds: . dexamethasone  4 mg Oral Daily  . feeding supplement (ENSURE ENLIVE)  237 mL Oral BID BM  . fentaNYL  25 mcg Transdermal Q72H  . levothyroxine  75 mcg Oral QAC breakfast  . lidocaine  2 patch Transdermal Q24H  . nystatin  5 mL Oral QID  . pantoprazole  40 mg Oral BID WC  . polyethylene glycol  17 g Oral BID  . pregabalin  25 mg Oral BID  . senna  1 tablet Oral BID  . sodium chloride flush  3 mL Intravenous Q12H  . sucralfate  1 g Oral TID WC & HS   Continuous Infusions: . ertapenem     PRN Meds: acetaminophen **OR** acetaminophen, alum & mag hydroxide-simeth, HYDROmorphone (DILAUDID) injection, lidocaine, magic mouthwash w/lidocaine, ondansetron **OR** ondansetron (ZOFRAN) IV, prochlorperazine, sodium chloride flush, traZODone   Vital Signs    Vitals:   06/06/18 0628 06/06/18 0634 06/06/18 2108 06/07/18 0611  BP: 123/78  97/69 129/70  Pulse: 74  80 70  Resp: 20  18 18   Temp: 97.9 F (36.6 C)  98 F (36.7 C) 98.2 F (36.8 C)  TempSrc: Oral  Oral Oral  SpO2: 92%  94% 91%  Weight:  64.8 kg    Height:        Intake/Output Summary (Last 24 hours) at 06/07/2018 1107 Last data filed at 06/07/2018 0600 Gross per 24 hour  Intake 340 ml  Output 1000 ml  Net -660 ml   Filed Weights   06/04/18 0539 06/05/18 0348 06/06/18 0634  Weight: 64.4 kg 63.8 kg 64.8 kg    Telemetry    Not on- Personally Reviewed  ECG    05/30/2018 sinus rhythm with QS pattern in leads V1-V4 consistent with old anteroseptal infarction - Personally Reviewed  Physical Exam   General: Well developed, well nourished, female in no acute distress Head: Eyes PERRLA, No xanthomas.   Normocephalic  and atraumatic Lungs: Clear bilaterally to auscultation. Heart: HRRR S1 S2, without MRG.  Pulses are 2+ & equal. No JVD. Abdomen: Bowel sounds are present, abdomen soft and non-tender without masses or  hernias noted. Msk: Normal strength and tone for age. Extremities: No clubbing, cyanosis or edema.    Skin:  No rashes or lesions noted. Neuro: Alert and oriented X 3. Psych:  Kermit Balo affect, responds appropriately  Labs    Chemistry Recent Labs  Lab 06/05/18 0551 06/06/18 0435 06/07/18 0406  NA 132* 134* 135  K 4.1 4.3 4.2  CL 98 101 102  CO2 26 27 24   GLUCOSE 80 86 83  BUN 23 27* 24*  CREATININE 0.45 0.47 0.48  CALCIUM 8.0* 8.2* 8.4*  PROT  --  5.0* 5.2*  ALBUMIN  --  2.4* 2.4*  AST  --  12* 12*  ALT  --  22 20  ALKPHOS  --  128* 150*  BILITOT  --  0.9 0.2*  GFRNONAA >60 >60 >60  GFRAA >60 >60 >60  ANIONGAP 8 6 9      Hematology Recent Labs  Lab 06/05/18 0551 06/06/18 0435 06/07/18 0406  WBC 0.7* 0.7* 2.8*  RBC 2.69* 2.68* 2.71*  HGB 7.6* 7.4* 7.6*  HCT 24.2* 23.8* 24.7*  MCV 90.0 88.8 91.1  MCH 28.3 27.6 28.0  MCHC 31.4 31.1 30.8  RDW 16.6* 16.7* 17.0*  PLT 230 210 180      Radiology    No results found.  Cardiac Studies   Echocardiogram 05/31/2018: Study Conclusions  - Left ventricle: The cavity size was normal. Systolic function was mildly reduced. The estimated ejection fraction was in the range of 45% to 50%. Hypokinesis of the apical myocardium. Features are consistent with a pseudonormal left ventricular filling pattern, with concomitant abnormal relaxation and increased filling pressure (grade 2 diastolic dysfunction). There was a thrombus. - Regional wall motion abnormality: Hypokinesis of the mid anteroseptal, mid inferoseptal, apical inferior, apical septal, and apical myocardium. - Mitral valve: Calcified annulus. Mildly thickened leaflets . Late systolic prolapse, involving the anterior leaflet. There  was moderate late systolic regurgitation due to prolapse. - Left atrium: The atrium was mildly dilated.  Impressions:  - Echo contrast (definity) used to evaluate for LV thrombus. The only image suggestive of thrombus is image 58 (screenshot saved with my annotation with arrow). There is a dense structure that comes in and out of view at approximately 10-11 o&'clock on that image. This could align with the mass seen on CT imaging, as the shape/size are similar. Favor LV thrombus.  Patient Profile     77 y.o. female with a history of extensive small cell lung cancer with metastasis to bones and liver, also epidural tumor with cord compression at T6, deconditioning, hypothyroidism, COPD, chronic pain, anemia secondary to GI bleed, and recently documented LV mural thrombus.  Assessment & Plan    1. Apical LV mural thrombus:  -Seen on chest CT and echocardiogram - Long discussion (greater than 30 minutes) of the risks and benefits of anticoagulation - The bottom line is the either she takes the risk of bleeding, including recurrence of the GI bleeding which she had on admission -Versus the risk of the LV thrombus - If they decide to go ahead with anticoagulation, will review with pharmacy to make sure she is a candidate for Eliquis  2. GI bleed:  - Cause felt to be diverticular/hemorrhoidal bleeding -Hemoglobin has been in the sevens for the last 3 days -Management per GI/IM  3. Lung cancer with metastasis to liver, bone, and epidural with cord compression at T6:  -Patient wishes to continue chemotherapy -Per IM, Oncology  Plan: I explained that cardiology would come back to speak to them once we were called.  I explained that they needed to make a decision about undergoing the risks of anticoagulation or decide not to pursue this. -It is okay for them to work through Oncology, or PCP for the anticoagulation.  For questions or updates, please contact [place] Please consult [email or web address] for contact info under Cardiology/STEMI.   [facility] will sign off.   Medication Recommendations:  No new recs.  Contact cardiology if we can assist further once she makes a decision about anticoagulation Other recommendations (labs, testing, etc):  None Follow up as an outpatient: As needed     Signed, Rosaria Ferries, PA-C  06/07/2018, 11:07 AM   [phone]

## 2018-06-07 NOTE — Progress Notes (Signed)
ANTICOAGULATION CONSULT NOTE - Initial Consult  Pharmacy Consult for Eliquis Indication: apical LV mural thrombus  Allergies  Allergen Reactions  . Atorvastatin     Intolerant to all statins   . Cyclobenzaprine Other (See Comments)    Bradycardia, hypotension  . Ezetimibe      INTOL to Zetia  . Rosuvastatin     intolerant to all statins  . Gadolinium Derivatives Nausea Only    Pt became very nauseous after gadolinium administration//lh  . Nicoderm [Nicotine] Rash    Unable to use the patches---caused severe rash to area placed    Patient Measurements: Height: 5\' 5"  (165.1 cm) Weight: 142 lb 14.4 oz (64.8 kg) IBW/kg (Calculated) : 57  Vital Signs: Temp: 98.2 F (36.8 C) (12/20 0611) Temp Source: Oral (12/20 0611) BP: 129/70 (12/20 0611) Pulse Rate: 70 (12/20 0611)  Labs: Recent Labs    06/05/18 0551 06/06/18 0435 06/07/18 0406  HGB 7.6* 7.4* 7.6*  HCT 24.2* 23.8* 24.7*  PLT 230 210 180  CREATININE 0.45 0.47 0.48    Estimated Creatinine Clearance: 51.3 mL/min (by C-G formula based on SCr of 0.48 mg/dL).   Medical History: Past Medical History:  Diagnosis Date  . Asymptomatic varicose veins   . Cataract    beginnings  . Cigarette smoker   . COPD (chronic obstructive pulmonary disease) ([place])   . Diverticulosis of colon (without mention of hemorrhage)   . Headache(784.0)   . Hearing loss    wears hearing aides  . Lumbar back pain   . Pure hypercholesterolemia   . Stress disorder, acute   . Varicose veins     Medications:  No anticoagulants PTA  Assessment: 77 yo F with complicated admission well known to pharmacy.  PMH significant for metastatic SCLC actively receiving chemotherapy & radiation who presented to hospital with rectal bleed.  Subsequently she was found to have apical LV clot & Ecoli-ESBL emphysematous cystitis during hospitalization.    06/07/2018: CBC: Neutropenic on Granix x4 days, Hg has been stable ~7.5 for several days, Pltc WNL.   No bleeding noted.  Oncologist ok with starting anticoagulation (deferred to cardiology). Renal: Scr at baseline (0.48 today)  Plan:   Start Eliquis 5mg  PO BID- no loading dose given patient's high risk for bleeding per discussion with MD  Monitor CBC and s/sx of bleeding  Effie Wahlert, Lavonia Drafts 06/07/2018,12:47 PM

## 2018-06-07 NOTE — Discharge Instructions (Addendum)
Information on my medicine - ELIQUIS (apixaban)  Why was Eliquis prescribed for you? Eliquis was prescribed to treat your left ventricular thrombus (blood clot) and to reduce the risk of it occurring again.  What do You need to know about Eliquis ? The dose is ONE 5 mg tablet taken TWICE daily.  Eliquis may be taken with or without food.   Try to take the dose about the same time in the morning and in the evening. If you have difficulty swallowing the tablet whole please discuss with your pharmacist how to take the medication safely.  Take Eliquis exactly as prescribed and DO NOT stop taking Eliquis without talking to the doctor who prescribed the medication.  Stopping may increase your risk of developing a new blood clot.  Refill your prescription before you run out.  After discharge, you should have regular check-up appointments with your healthcare provider that is prescribing your Eliquis.    What do you do if you miss a dose? If a dose of ELIQUIS is not taken at the scheduled time, take it as soon as possible on the same day and twice-daily administration should be resumed. The dose should not be doubled to make up for a missed dose.  Important Safety Information A possible side effect of Eliquis is bleeding. You should call your healthcare provider right away if you experience any of the following: ? Bleeding from an injury or your nose that does not stop. ? Unusual colored urine (red or dark brown) or unusual colored stools (red or black). ? Unusual bruising for unknown reasons. ? A serious fall or if you hit your head (even if there is no bleeding).  Some medicines may interact with Eliquis and might increase your risk of bleeding or clotting while on Eliquis. To help avoid this, consult your healthcare provider or pharmacist prior to using any new prescription or non-prescription medications, including herbals, vitamins, non-steroidal anti-inflammatory drugs (NSAIDs) and  supplements.  This website has more information on Eliquis (apixaban): http://www.eliquis.com/eliquis/home

## 2018-06-07 NOTE — Progress Notes (Signed)
NT reports to RN pt. BP 70'S/ 50'S X 2 this RN to assess. This RN does manual BP - 72/58. MD made aware. 1 L bolus of LR infusing to patient. Will continue to monitor.

## 2018-06-07 NOTE — Clinical Social Work Note (Signed)
Clinical Social Work Assessment  Patient Details  Name: [name] [name] MRN: [id number] Date of Birth: 1941/01/01  Date of referral:  06/07/18               Reason for consult:  Abuse/Neglect, Facility Placement, Discharge Planning                Permission sought to share information with:  Family Supports Permission granted to share information::  Yes, Verbal Permission Granted  Name::     Haskel Khan  Agency::  [facility]  Relationship::  son  Contact Information:  [phone]  Housing/Transportation Living arrangements for the past 2 months:  [place] Key of Information:  Adult Children Patient Interpreter Needed:  None Criminal Activity/Legal Involvement Pertinent to Current Situation/Hospitalization:  No - Comment as needed Significant Relationships:  Adult Children Lives with:  Self Do you feel safe going back to the place where you live?  Yes Need for family participation in patient care:  Yes (Comment)  Care giving concerns:  Patient is coming from [facility] and will be going back per son. CSW met patient at bedside with patients son present.   Social Worker assessment / plan:  CSW met patient and son at bedside to discuss discharge plans and offer support. Patient very pleasant but feel asleep during assessment. CSW spoke with son who stated he would like patient to return back to [facility] to receive therapy. Patient son aware that patient will most likely have a copay if she returns back to [facility] since patient has been at facility for 16 day prior to coming to the hospital. CSW started authorization through [facility] and is awaiting there response.  Employment status:  Retired Nurse, adult PT Recommendations:  [place] / Referral to community resources:  [place]  Patient/Family's Response to care:  Patients son supportive of patient and  her needs  Patient/Family's Understanding of and Emotional Response to Diagnosis, Current Treatment, and Prognosis:  Family wanting patient to return back to [employer]   Emotional Assessment Appearance:  Appears stated age Attitude/Demeanor/Rapport:  Engaged Affect (typically observed):  Accepting Orientation:  Oriented to Self, Oriented to Place, Oriented to  Time, Oriented to Situation Alcohol / Substance use:  Not Applicable Psych involvement (Current and /or in the community):  No (Comment)  Discharge Needs  Concerns to be addressed:  Care Coordination Readmission within the last 30 days:  No Current discharge risk:  Dependent with Mobility Barriers to Discharge:  [place], LCSW 06/07/2018, 4:14 PM

## 2018-06-07 NOTE — NC FL2 (Signed)
[place] LEVEL OF CARE SCREENING TOOL     IDENTIFICATION  Patient Name: [name] [name] Birthdate: [birth year] Sex: female Admission Date (Current Location): 05/30/2018  [facility] and [place] Number:  Herbalist and Address:  [facility],  [place], [place]      Provider Number: [id number]  Attending Physician Name and Address:  Elodia Florence., *  Relative Name and Phone Number:  Haskel Khan,    [phone]     Current Level of Care: Hospital Recommended Level of Care: [place] Prior Approval Number:    Date Approved/Denied:   PASRR Number: [id number]  Discharge Plan: SNF    Current Diagnoses: Patient Active Problem List   Diagnosis Date Noted  . Complete lesion at T6 level of thoracic spinal cord ([place]) 06/03/2018  . Urinary and fecal incontinence 06/03/2018  . Rectal bleeding   . ESBL (extended spectrum beta-lactamase) producing bacteria infection   . Emphysematous cystitis   . Left ventricular thrombus without MI 05/30/2018  . BRBPR (bright red blood per rectum) 05/30/2018  . Goals of care, counseling/discussion 05/14/2018  . Encounter for antineoplastic chemotherapy 05/14/2018  . Small cell carcinoma of lower lobe of left lung ([place]) 05/06/2018  . Spinal cord compression due to malignant neoplasm metastatic to spine ([place]) 04/30/2018  . Presumed cancer of left upper lobe of lung pending biopsy ([place]) 04/30/2018  . Carbapenem-resistant bacterial infection 04/25/2018  . Liver mass 04/10/2018  . Aortic atherosclerosis ([place]) 04/10/2018  . Elevated BP without diagnosis of hypertension 04/05/2018  . Acute lower UTI 04/01/2018  . Constipation 04/01/2018  . Hyponatremia 03/23/2018  . Debility 03/23/2018  . Pubic ramus fracture ([place]) 03/23/2018  . Primary osteoarthritis of right hip 02/07/2018  . Belching 02/01/2018  . Acquired trigger finger of right middle finger 07/20/2017  . Varicose  veins 12/02/2013  . OA (osteoarthritis) 08/15/2012  . Hearing loss 08/16/2009  . Hypothyroidism 10/12/2007  . CIGARETTE SMOKER 10/09/2007  . HYPERCHOLESTEROLEMIA 10/08/2007  . COPD (chronic obstructive pulmonary disease) ([place]) 10/08/2007  . BACK PAIN, LUMBAR 10/08/2007    Orientation RESPIRATION BLADDER Height & Weight     Self, Time, Situation, Place  Normal Incontinent, External catheter Weight: 142 lb 14.4 oz (64.8 kg) Height:  5\' 5"  (165.1 cm)  BEHAVIORAL SYMPTOMS/MOOD NEUROLOGICAL BOWEL NUTRITION STATUS      Incontinent Diet(regular)  AMBULATORY STATUS COMMUNICATION OF NEEDS Skin   Extensive Assist   Normal                       Personal Care Assistance Level of Assistance    Bathing Assistance: Limited assistance Feeding assistance: Independent Dressing Assistance: Limited assistance     Functional Limitations Info  Sight, Hearing, Speech Sight Info: Adequate Hearing Info: Impaired Speech Info: Adequate    SPECIAL CARE FACTORS FREQUENCY  PT (By licensed PT), OT (By licensed OT)     PT Frequency: 5x wk OT Frequency: 5x wk            Contractures Contractures Info: Not present    Additional Factors Info  Allergies, Code Status Code Status Info: DNR Allergies Info: ATORVASTATIN, CYCLOBENZAPRINE, EZETIMIBE, ROSUVASTATIN, GADOLINIUM DERIVATIVES, NICODERM NICOTINE           Current Medications (06/07/2018):  This is the current hospital active medication list Current Facility-Administered Medications  Medication Dose Route Frequency Provider Last Rate Last Dose  . acetaminophen (TYLENOL) tablet 650 mg  650 mg Oral  Q6H PRN Lenore Cordia, MD       Or  . acetaminophen (TYLENOL) suppository 650 mg  650 mg Rectal Q6H PRN Lenore Cordia, MD      . alum & mag hydroxide-simeth (MAALOX/MYLANTA) 200-200-20 MG/5ML suspension 30 mL  30 mL Oral Q6H PRN Florencia Reasons, MD   30 mL at 06/07/18 1033  . dexamethasone (DECADRON) tablet 4 mg  4 mg Oral Daily Lenore Cordia, MD   4 mg at 06/07/18 1033  . ertapenem (INVANZ) 1,000 mg in sodium chloride 0.9 % 100 mL IVPB  1 g Intravenous Q24H Elodia Florence., MD      . feeding supplement (ENSURE ENLIVE) (ENSURE ENLIVE) liquid 237 mL  237 mL Oral BID BM Lenore Cordia, MD   237 mL at 06/07/18 1033  . fentaNYL (DURAGESIC - dosed mcg/hr) 12.5 mcg  12.5 mcg Transdermal Q72H Elodia Florence., MD       And  . fentaNYL ([place] - dosed mcg/hr) patch 25 mcg  25 mcg Transdermal Q72H Elodia Florence., MD      . HYDROmorphone (DILAUDID) injection 0.5 mg  0.5 mg Intravenous Q2H PRN Lane Hacker L, DO   0.5 mg at 06/07/18 0202  . levothyroxine (SYNTHROID, LEVOTHROID) tablet 75 mcg  75 mcg Oral QAC breakfast Lenore Cordia, MD   75 mcg at 06/07/18 0659  . lidocaine (LIDODERM) 5 % 2 patch  2 patch Transdermal Q24H Lane Hacker L, DO   2 patch at 06/07/18 1033  . lidocaine (XYLOCAINE) 2 % viscous mouth solution 15 mL  15 mL Mouth/Throat Q3H PRN Lenore Cordia, MD   15 mL at 05/31/18 1330  . magic mouthwash w/lidocaine  5 mL Oral TID PRN Florencia Reasons, MD   5 mL at 06/06/18 1634  . nystatin (MYCOSTATIN) 100000 UNIT/ML suspension 500,000 Units  5 mL Oral QID Lenore Cordia, MD   500,000 Units at 06/07/18 1033  . ondansetron (ZOFRAN) tablet 4 mg  4 mg Oral Q6H PRN Lenore Cordia, MD       Or  . ondansetron (ZOFRAN) injection 4 mg  4 mg Intravenous Q6H PRN Zada Finders R, MD      . pantoprazole (PROTONIX) EC tablet 40 mg  40 mg Oral BID WC Elodia Florence., MD   40 mg at 06/07/18 0835  . polyethylene glycol (MIRALAX / GLYCOLAX) packet 17 g  17 g Oral BID Loralie Champagne, PA-C   17 g at 06/07/18 1033  . pregabalin (LYRICA) capsule 25 mg  25 mg Oral BID Lane Hacker L, DO   25 mg at 06/07/18 1032  . prochlorperazine (COMPAZINE) tablet 10 mg  10 mg Oral Q6H PRN Lenore Cordia, MD      . senna (SENOKOT) tablet 8.6 mg  1 tablet Oral BID Zada Finders R, MD   8.6 mg at 06/07/18 1032  . sodium  chloride flush (NS) 0.9 % injection 10-40 mL  10-40 mL Intracatheter PRN Florencia Reasons, MD   10 mL at 06/05/18 1522  . sodium chloride flush (NS) 0.9 % injection 3 mL  3 mL Intravenous Q12H Lenore Cordia, MD   3 mL at 06/07/18 0837  . sucralfate (CARAFATE) 1 GM/10ML suspension 1 g  1 g Oral TID WC & HS Lane Hacker L, DO   1 g at 06/07/18 1247  . traZODone (DESYREL) tablet 50 mg  50 mg Oral QHS PRN Florene Glen, A  Clint Lipps., MD         Discharge Medications: Please see discharge summary for a list of discharge medications.  Relevant Imaging Results:  Relevant Lab Results:   Additional Information SSN: [id number]. pallitive needs to follow at facility. pt is getting chemo theraphy   Wende Neighbors, LCSW

## 2018-06-07 NOTE — Progress Notes (Signed)
[facility] Nurse Delle Reining will fax chemo schedule to fax#[phone]-as soon as she can.

## 2018-06-07 NOTE — Progress Notes (Addendum)
PROGRESS NOTE    [name] [name]  OIZ:[id number] DOB: 16-Jan-1941 DOA: 05/30/2018 PCP: Marin Olp, MD   Brief Narrative: Per HPI [name] [name] is [name] [name] 77 y.o. female with medical history significant for Small Cell Lung Cancer with metastasis to the liver and spine with cord compression on systemic chemotherapy and palliative radiotherapy, hypothyroidism, and diverticulosis who presented to the [facility] long ED from her oncologist office due to abdominal pain, BRBPR seen in the office, and increased lethargy.  Patient's son and husband are at bedside and provide additional history.  Patient states that she has had inability to walk since starting palliative radiotherapy for her T6 metastatic bone disease.  She recently developed abdominal pain but reports regular daily bowel movements normal in appearance until today when she noted bright red blood mixed into her stool.  She reports good urine output without dysuria or hematuria.  She had Clinton Wahlberg prior ESBL E. coli UTI in October 2019.  She also reports cough with new green sputum production earlier today.  She has had night sweats but denies fevers.  She denies chest pain, lightheadedness, or dizziness.  She has had nausea, indigestion, with acid reflux but reports good oral intake recently.  She was found to have apical LV clot as well as emphysematous cystitis with ESBL.   GI now signed off.  Oncology, palliative care, cards following.  Assessment & Plan:   Principal Problem:   Left ventricular thrombus without MI Active Problems:   Hypothyroidism   Spinal cord compression due to malignant neoplasm metastatic to spine (HCC)   Small cell carcinoma of lower lobe of left lung (HCC)   BRBPR (bright red blood per rectum)   Complete lesion at T6 level of thoracic spinal cord (HCC)   Urinary and fecal incontinence   Rectal bleeding   ESBL (extended spectrum beta-lactamase) producing bacteria infection   Emphysematous cystitis  Hypotension -  systolics in the 33'[name] this afternoon, improved with IVF.  Pt seems asx, [name].  Son notes she may be [name] little quieter today. - s/p 2 L LR - labs notable for normal lactate, Hb 7.7 which is stable - continue to monitor - AM cortisol suppressed, pt on chronic dexamethasone   left ventricular thrombus -CT ab report "visualization of [name] 1.9 x 1.2 x 1.9 cm apical left ventricular clot. This places the patient at increased risk for embolic stroke or other vascular occlusions" -Cardiology consulted, appreciate recs - discussed with cardiology today, we're planning to start eliquis based on previous cards note.  Cardiology rec f/u echo as outpatient. - Start eliquis, follow for si/sx bleeding.  Discussed with pharmacy, planning for 5 mg BID.  Discussed with pt with son at bedside (risks/benefits discussed).  GI bleed with BRBPR  -She is sent from oncology clinic for admission due to GI bleed and lethargy -No active bleed since in the hospital,  -GI consulted thought bleeding is from anal rectal mucosal bleeding or internal hemorrhoidal bleeding -no blood in stool since in the hospital (discussed with Dr. Loletha Carrow who originally saw her regarding plan to start anticoagulation, and he was ok with this, recommended close follow up)  Acute on chronic anemia in the setting of malignancy -no sign of blood loss in the hospital, stool is brown, no visible blood. - Hb fluctuating, but stable, continue to follow - supportive transfusion to keep hgb> 7,   Neutropenia:  ANC 1.5 today, improved.  S/p Granix.  Bilateral ankle pitting edema - seems improved  -  venous ultrasound negative for DVT,  -she has been on Decadron since radiation, Decadron could cause edema as well. Pt also with HFpEF. - Holding off on lasix.    Emphysematous cystitis, mild right hydronephrosis and hydroureter with mild diffuse uroepithelial thickening and enhancement, compatible with infection. identified on CT abdomen  On  12/12 -Patient is immunosuppressed, case discussed with infectious disease who recommended treat with antibiotic meropenem for 10 to 14 days -> switch to invanz in preparation for d/c to SNF.   --Urine culture + ESBL  E. Coli, continue meropenem (she has [name] picc line placed two months ago, checked with IV team states picc can stay in for up to 17months if no complications.)  Extensive stage small cell lung cancer: Widespread metastatic disease to the spine , epidural space and liver  widespread metastatic disease involving numerous thoracic vertebrae including T1, T2, T5, T6, T7, T9, T11, and T12. Pathologic fracture T9. Bulky dorsal epidural tumor at T6 results in significant cord compression.   Per previous neurosurgery recommendation " biopsy shows small cell, no role for surgery"  She completed palliative radiotherapy for T6 cord compression in 04/2018.  She is continued on Decadron 4 mg daily.   The patient is status post cycle 2 days 1 and 2 of carboplatin and etoposide. Day 3 on 12/12 was held due to gi bleed and lethargy, she is sent to the ED and hospitalized on the same day. She did not received Scheduled for PEG filgrastim on 06/01/2018 due to hospitalization.   CT ab/pel obtained in the ED on 12/12 showed:  Musculoskeletal: Bilateral sacral ala fractures are again demonstrated with interval diffuse patchy bone sclerosis and Nonunion.  Also again demonstrated are right inferior pubic ramus and ischial/anterior acetabular fractures. Mild increase in associated callus formation with nonunion.  Interval visualization of [name] mildly comminuted right superior pubic ramus fracture with patchy bone sclerosis.  Moderate right hip degenerative changes. Lumbar and lower thoracic spine degenerative changes with progressive patchy sclerosis involving multiple lumbar and lower thoracic vertebral bodies.  Severe Indigestion -Patient reports indigestion since started on radiation  therapy -Continue PPI  Chronic cancer pain -Continue home meds including fentanyl patch (was planning to increase this to 37.5 today, but will hold off with hypotension above)  -palliative care consulted  COPD /she continues to smoke -No wheezing, no hypoxia at rest  Hypothyroidism, continue Synthroid supplement  Constipation, stool softener  FTT: Very deconditioned and weak, need 2 person assists to get out of bed, not able to stand up by herself   Goals of Care: appreciate palliative care and oncology.  Pt desires to go back to [facility] when stable for discharge and would like to try 1 more round of chemo.  Will need palliative care to follow at SNF.  DVT prophylaxis: SCD Code Status: full code Family Communication: friend at bedside Disposition Plan: pending further improvement   Consultants:   Oncology  GI  Cardiology  Palliative Care  Procedures:  LE [place] Summary:  Right: There is no evidence of deep vein thrombosis in the lower extremity. However, portions of this examination were limited- see technologist comments above. No cystic structure found in the popliteal fossa.  Left: There is no evidence of deep vein thrombosis in the lower extremity. However, portions of this examination were limited- see technologist comments above. No cystic structure found in the popliteal fossa.   Echo Impressions:  - Echo contrast (definity) used to evaluate for LV thrombus.   The only image  suggestive of thrombus is image 65 (screenshot   saved with my annotation with arrow). There is Shaman Muscarella dense structure   that comes in and out of view at approximately 10-11 o&'clock on   that image. This could align with the mass seen on CT imaging, as   the shape/size are similar. Favor LV thrombus.  Antimicrobials:  Anti-infectives (From admission, onward)   Start     Dose/Rate Route Frequency Ordered Stop   06/07/18 1800  ertapenem (INVANZ) 1,000 mg in sodium chloride 0.9 % 100  mL IVPB     1 g 200 mL/hr over 30 Minutes Intravenous Every 24 hours 06/07/18 1043 06/11/18 1759   05/31/18 1800  meropenem (MERREM) 1 g in sodium chloride 0.9 % 100 mL IVPB  Status:  Discontinued     1 g 200 mL/hr over 30 Minutes Intravenous Every 8 hours 05/31/18 1707 06/07/18 1043     Subjective: Denies complaints today. Son at bedside.  Irving Bloor&Ox3.    Objective: Vitals:   06/07/18 1520 06/07/18 1528 06/07/18 1600 06/07/18 1747  BP: (!) 88/64 90/63 97/69  105/69  Pulse: 86   84  Resp:      Temp:      TempSrc:      SpO2: 100% 95%  95%  Weight:      Height:        Intake/Output Summary (Last 24 hours) at 06/07/2018 1750 Last data filed at 06/07/2018 1700 Gross per 24 hour  Intake 1085.75 ml  Output 1800 ml  Net -714.25 ml   Filed Weights   06/04/18 0539 06/05/18 0348 06/06/18 0634  Weight: 64.4 kg 63.8 kg 64.8 kg    Examination:  General: No acute distress. Cardiovascular: Heart sounds show Antonin Meininger regular rate, and rhythm. Lungs: Clear to auscultation bilaterally  Abdomen: Soft, nontender, nondistended  Neurological: Alert and oriented 3. Moves all extremities 4. Cranial nerves II through XII grossly intact. Skin: Warm and dry. No rashes or lesions. Extremities: No clubbing or cyanosis. No edema.  Psychiatric: Mood and affect are normal. Insight and judgment are appropriate.   Data Reviewed: I have personally reviewed following labs and imaging studies  CBC: Recent Labs  Lab 06/02/18 0513 06/03/18 0553 06/04/18 0422 06/05/18 0551 06/06/18 0435 06/07/18 0406 06/07/18 1601  WBC 5.1 1.8* 1.0* 0.7* 0.7* 2.8* 6.4  NEUTROABS 3.4 0.9* 0.3*  --  0.1* 1.5*  --   HGB 7.7* 8.2* 7.9* 7.6* 7.4* 7.6* 7.7*  HCT 24.9* 26.1* 25.1* 24.2* 23.8* 24.7* 24.7*  MCV 90.2 87.9 88.1 90.0 88.8 91.1 91.8  PLT 383 396 321 230 210 180 937   Basic Metabolic Panel: Recent Labs  Lab 06/01/18 0433 06/02/18 0513  06/04/18 0422 06/05/18 0551 06/06/18 0435 06/07/18 0406 06/07/18 1601   NA 134* 135   < > 134* 132* 134* 135 133*  K 4.3 4.3   < > 4.4 4.1 4.3 4.2 3.9  CL 102 101   < > 100 98 101 102 98  CO2 27 26   < > 26 26 27 24 26   GLUCOSE 102* 89   < > 81 80 86 83 135*  BUN 22 26*   < > 26* 23 27* 24* 25*  CREATININE 0.70 0.51   < > 0.51 0.45 0.47 0.48 0.69  CALCIUM 7.5* 7.9*   < > 8.2* 8.0* 8.2* 8.4* 8.4*  MG 2.5* 2.6*  --   --   --   --  2.4  --    < > =  values in this interval not displayed.   GFR: Estimated Creatinine Clearance: 51.3 mL/min (by C-G formula based on SCr of 0.69 mg/dL). Liver Function Tests: Recent Labs  Lab 06/06/18 0435 06/07/18 0406  AST 12* 12*  ALT 22 20  ALKPHOS 128* 150*  BILITOT 0.9 0.2*  PROT 5.0* 5.2*  ALBUMIN 2.4* 2.4*   No results for input(s): LIPASE, AMYLASE in the last 168 hours. No results for input(s): AMMONIA in the last 168 hours. Coagulation Profile: No results for input(s): INR, PROTIME in the last 168 hours. Cardiac Enzymes: No results for input(s): CKTOTAL, CKMB, CKMBINDEX, TROPONINI in the last 168 hours. BNP (last 3 results) No results for input(s): PROBNP in the last 8760 hours. HbA1C: No results for input(s): HGBA1C in the last 72 hours. CBG: No results for input(s): GLUCAP in the last 168 hours. Lipid Profile: No results for input(s): CHOL, HDL, LDLCALC, TRIG, CHOLHDL, LDLDIRECT in the last 72 hours. Thyroid Function Tests: No results for input(s): TSH, T4TOTAL, FREET4, T3FREE, THYROIDAB in the last 72 hours. Anemia Panel: No results for input(s): VITAMINB12, FOLATE, FERRITIN, TIBC, IRON, RETICCTPCT in the last 72 hours. Sepsis Labs: Recent Labs  Lab 06/01/18 0433 06/07/18 1601  LATICACIDVEN 1.5 1.9    Recent Results (from the past 240 hour(s))  Culture, Urine     Status: Abnormal   Collection Time: 05/30/18  5:29 PM  Result Value Ref Range Status   Specimen Description   Final    URINE, CLEAN CATCH Performed at [facility] [address] [place], [place] [address]     Special Requests   Final    NONE Performed at [facility], [place] [address] [place], [place] [address]    Culture (Elzabeth Mcquerry)  Final    >=100,000 COLONIES/mL ESCHERICHIA COLI Confirmed Extended Spectrum Beta-Lactamase Producer (ESBL).  In bloodstream infections from ESBL organisms, carbapenems are preferred over piperacillin/tazobactam. They are shown to have Khaylee Mcevoy lower risk of mortality.    Report Status 06/02/2018 FINAL  Final   Organism ID, Bacteria ESCHERICHIA COLI (Tahirih Lair)  Final      Susceptibility   Escherichia coli - MIC*    AMPICILLIN >=32 RESISTANT Resistant     CEFAZOLIN >=64 RESISTANT Resistant     CEFTRIAXONE >=64 RESISTANT Resistant     CIPROFLOXACIN 0.5 SENSITIVE Sensitive     GENTAMICIN >=16 RESISTANT Resistant     IMIPENEM <=0.25 SENSITIVE Sensitive     NITROFURANTOIN <=16 SENSITIVE Sensitive     TRIMETH/SULFA >=320 RESISTANT Resistant     AMPICILLIN/SULBACTAM 16 INTERMEDIATE Intermediate     PIP/TAZO <=4 SENSITIVE Sensitive     Extended ESBL POSITIVE Resistant     * >=100,000 COLONIES/mL ESCHERICHIA COLI         Radiology Studies: No results found.      Scheduled Meds: . apixaban  5 mg Oral BID  . dexamethasone  4 mg Oral Daily  . feeding supplement (ENSURE ENLIVE)  237 mL Oral BID BM  . fentaNYL  25 mcg Transdermal Q72H  . levothyroxine  75 mcg Oral QAC breakfast  . lidocaine  2 patch Transdermal Q24H  . nystatin  5 mL Oral QID  . pantoprazole  40 mg Oral BID WC  . polyethylene glycol  17 g Oral BID  . pregabalin  25 mg Oral BID  . senna  1 tablet Oral BID  . sodium chloride flush  3 mL Intravenous Q12H  . sucralfate  1 g Oral TID WC & HS  Continuous Infusions: . ertapenem 1,000 mg (06/07/18 1748)     LOS: 8 days    Time spent: over 46 min    Fayrene Helper, MD [facility] Pager [phone]  If 7PM-7AM, please contact night-coverage [email or web address] Password [email or web address] 06/07/2018, 5:50 PM

## 2018-06-08 LAB — COMPREHENSIVE METABOLIC PANEL
ALT: 21 U/L (ref 0–44)
AST: 14 U/L — ABNORMAL LOW (ref 15–41)
Albumin: 2.4 g/dL — ABNORMAL LOW (ref 3.5–5.0)
Alkaline Phosphatase: 164 U/L — ABNORMAL HIGH (ref 38–126)
Anion gap: 9 (ref 5–15)
BUN: 25 mg/dL — ABNORMAL HIGH (ref 8–23)
CALCIUM: 8.3 mg/dL — AB (ref 8.9–10.3)
CO2: 27 mmol/L (ref 22–32)
CREATININE: 0.53 mg/dL (ref 0.44–1.00)
Chloride: 97 mmol/L — ABNORMAL LOW (ref 98–111)
GFR calc Af Amer: >60 mL/min (ref >60)
GFR calc non Af Amer: >60 mL/min (ref >60)
Glucose, Bld: 99 mg/dL (ref 70–99)
Potassium: 3.9 mmol/L (ref 3.5–5.1)
Sodium: 133 mmol/L — ABNORMAL LOW (ref 135–145)
Total Bilirubin: 0.4 mg/dL (ref 0.3–1.2)
Total Protein: 5 g/dL — ABNORMAL LOW (ref 6.5–8.1)

## 2018-06-08 LAB — CBC
HCT: 24.6 % — ABNORMAL LOW (ref 36.0–46.0)
HEMOGLOBIN: 7.6 g/dL — AB (ref 12.0–15.0)
MCH: 27.4 pg (ref 26.0–34.0)
MCHC: 30.9 g/dL (ref 30.0–36.0)
MCV: 88.8 fL (ref 80.0–100.0)
Platelets: 177 10*3/uL (ref 150–400)
RBC: 2.77 MIL/uL — ABNORMAL LOW (ref 3.87–5.11)
RDW: 17.3 % — ABNORMAL HIGH (ref 11.5–15.5)
WBC: 11.9 10*3/uL — ABNORMAL HIGH (ref 4.0–10.5)
nRBC: 1.2 % — ABNORMAL HIGH (ref 0.0–0.2)

## 2018-06-08 LAB — MAGNESIUM: Magnesium: 2.2 mg/dL (ref 1.7–2.4)

## 2018-06-08 MED ORDER — FENTANYL 12 MCG/HR TD PT72
12.5000 ug | MEDICATED_PATCH | TRANSDERMAL | Status: DC
  Administered 2018-06-08 – 2018-06-20 (×5): 12.5 ug via TRANSDERMAL
  Filled 2018-06-08 (×5): qty 1

## 2018-06-08 MED ORDER — FENTANYL 25 MCG/HR TD PT72
25.0000 ug | MEDICATED_PATCH | TRANSDERMAL | Status: DC
  Administered 2018-06-08 – 2018-06-20 (×5): 25 ug via TRANSDERMAL
  Filled 2018-06-08 (×5): qty 1

## 2018-06-08 NOTE — Progress Notes (Signed)
PROGRESS NOTE    [name] [name]  TDS:[id number] DOB: Aug 18, 1940 DOA: 05/30/2018 PCP: Marin Olp, MD   Brief Narrative: Per HPI [name] [name] is a 77 y.o. female with medical history significant for Small Cell Lung Cancer with metastasis to the liver and spine with cord compression on systemic chemotherapy and palliative radiotherapy, hypothyroidism, and diverticulosis who presented to the [facility] long ED from her oncologist office due to abdominal pain, BRBPR seen in the office, and increased lethargy.  Patient's son and husband are at bedside and provide additional history.  Patient states that she has had inability to walk since starting palliative radiotherapy for her T6 metastatic bone disease.  She recently developed abdominal pain but reports regular daily bowel movements normal in appearance until today when she noted bright red blood mixed into her stool.  She reports good urine output without dysuria or hematuria.  She had a prior ESBL E. coli UTI in October 2019.  She also reports cough with new green sputum production earlier today.  She has had night sweats but denies fevers.  She denies chest pain, lightheadedness, or dizziness.  She has had nausea, indigestion, with acid reflux but reports good oral intake recently.  She was found to have apical LV clot as well as emphysematous cystitis with ESBL.   GI now signed off.  Oncology, palliative care, cards following.  Assessment & Plan:   Principal Problem:   Left ventricular thrombus without MI Active Problems:   Hypothyroidism   Spinal cord compression due to malignant neoplasm metastatic to spine (HCC)   Small cell carcinoma of lower lobe of left lung (HCC)   BRBPR (bright red blood per rectum)   Complete lesion at T6 level of thoracic spinal cord (HCC)   Urinary and fecal incontinence   Rectal bleeding   ESBL (extended spectrum beta-lactamase) producing bacteria infection   Emphysematous cystitis  Hypotension -  occurred on 12/20, resolved with IVF - AM cortisol suppressed, pt on chronic dexamethasone   left ventricular thrombus -CT ab report "visualization of a 1.9 x 1.2 x 1.9 cm apical left ventricular clot. This places the patient at increased risk for embolic stroke or other vascular occlusions" -Cardiology consulted, appreciate recs - discussed with cardiology today, we're planning to start eliquis based on previous cards note.  Cardiology rec f/u echo as outpatient. - Start eliquis, follow for si/sx bleeding.  Discussed with pharmacy, planning for 5 mg BID (started 12/20 PM).  Discussed with pt with son at bedside (risks/benefits discussed).  GI bleed with BRBPR  -She is sent from oncology clinic for admission due to GI bleed and lethargy -No active bleed since in the hospital,  -GI consulted thought bleeding is from anal rectal mucosal bleeding or internal hemorrhoidal bleeding -no blood in stool since in the hospital (discussed with Dr. Loletha Carrow who originally saw her regarding plan to start anticoagulation, and he was ok with this, recommended close follow up)  Acute on chronic anemia in the setting of malignancy -no sign of blood loss in the hospital, stool is brown, no visible blood. - Hb fluctuating, but stable, continue to follow - supportive transfusion to keep hgb> 7,   Neutropenia:  ANC 1.5 12/20, improved.  S/p Granix.  Bilateral ankle pitting edema - seems improved  -venous ultrasound negative for DVT,  -she has been on Decadron since radiation, Decadron could cause edema as well. Pt also with HFpEF. - Holding off on lasix.    Emphysematous cystitis, mild right  hydronephrosis and hydroureter with mild diffuse uroepithelial thickening and enhancement, compatible with infection. identified on CT abdomen  On 12/12 -Patient is immunosuppressed, case discussed with infectious disease who recommended treat with antibiotic meropenem for 10 to 14 days -> switch to invanz in  preparation for d/c to SNF.   --Urine culture + ESBL  E. Coli, continue meropenem (she has a picc line placed two months ago, checked with IV team states picc can stay in for up to 54months if no complications.)  Extensive stage small cell lung cancer: Widespread metastatic disease to the spine , epidural space and liver  widespread metastatic disease involving numerous thoracic vertebrae including T1, T2, T5, T6, T7, T9, T11, and T12. Pathologic fracture T9. Bulky dorsal epidural tumor at T6 results in significant cord compression.   Per previous neurosurgery recommendation " biopsy shows small cell, no role for surgery"  She completed palliative radiotherapy for T6 cord compression in 04/2018.  She is continued on Decadron 4 mg daily.   The patient is status post cycle 2 days 1 and 2 of carboplatin and etoposide. Day 3 on 12/12 was held due to gi bleed and lethargy, she is sent to the ED and hospitalized on the same day. She did not received Scheduled for PEG filgrastim on 06/01/2018 due to hospitalization.   CT ab/pel obtained in the ED on 12/12 showed:  Musculoskeletal: Bilateral sacral ala fractures are again demonstrated with interval diffuse patchy bone sclerosis and Nonunion.  Also again demonstrated are right inferior pubic ramus and ischial/anterior acetabular fractures. Mild increase in associated callus formation with nonunion.  Interval visualization of a mildly comminuted right superior pubic ramus fracture with patchy bone sclerosis.  Moderate right hip degenerative changes. Lumbar and lower thoracic spine degenerative changes with progressive patchy sclerosis involving multiple lumbar and lower thoracic vertebral bodies.  Severe Indigestion -Patient reports indigestion since started on radiation therapy -Continue PPI  Chronic cancer pain -Continue home meds including fentanyl patch (increase to 37.5, follow closely with hypotension seen earlier)  -palliative care  consulted  COPD /she continues to smoke -No wheezing, no hypoxia at rest  Hypothyroidism, continue Synthroid supplement  Constipation, stool softener  FTT: Very deconditioned and weak, need 2 person assists to get out of bed, not able to stand up by herself   Goals of Care: appreciate palliative care and oncology.  Pt desires to go back to [facility] when stable for discharge and would like to try 1 more round of chemo.  Will need palliative care to follow at SNF.  DVT prophylaxis: SCD Code Status: full code Family Communication: friend at bedside Disposition Plan: pending further improvement   Consultants:   Oncology  GI  Cardiology  Palliative Care  Procedures:  LE [place] Summary:  Right: There is no evidence of deep vein thrombosis in the lower extremity. However, portions of this examination were limited- see technologist comments above. No cystic structure found in the popliteal fossa.  Left: There is no evidence of deep vein thrombosis in the lower extremity. However, portions of this examination were limited- see technologist comments above. No cystic structure found in the popliteal fossa.   Echo Impressions:  - Echo contrast (definity) used to evaluate for LV thrombus.   The only image suggestive of thrombus is image 73 (screenshot   saved with my annotation with arrow). There is a dense structure   that comes in and out of view at approximately 10-11 o&'clock on   that image. This could align  with the mass seen on CT imaging, as   the shape/size are similar. Favor LV thrombus.  Antimicrobials:  Anti-infectives (From admission, onward)   Start     Dose/Rate Route Frequency Ordered Stop   06/07/18 1800  ertapenem (INVANZ) 1,000 mg in sodium chloride 0.9 % 100 mL IVPB     1 g 200 mL/hr over 30 Minutes Intravenous Every 24 hours 06/07/18 1043 06/11/18 1759   05/31/18 1800  meropenem (MERREM) 1 g in sodium chloride 0.9 % 100 mL IVPB  Status:  Discontinued      1 g 200 mL/hr over 30 Minutes Intravenous Every 8 hours 05/31/18 1707 06/07/18 1043     Subjective: Stable. Persistent heart burn pain.   Objective: Vitals:   06/07/18 1747 06/07/18 2125 06/08/18 0618 06/08/18 1339  BP: 105/69 112/71 (!) 142/87 100/64  Pulse: 84 84 76 78  Resp:  18 18 18   Temp:  98.6 F (37 C) 98 F (36.7 C) 98.2 F (36.8 C)  TempSrc:  Oral Oral Oral  SpO2: 95% 91% 94% 98%  Weight:      Height:        Intake/Output Summary (Last 24 hours) at 06/08/2018 1937 Last data filed at 06/08/2018 1900 Gross per 24 hour  Intake 240 ml  Output 1300 ml  Net -1060 ml   Filed Weights   06/04/18 0539 06/05/18 0348 06/06/18 0634  Weight: 64.4 kg 63.8 kg 64.8 kg    Examination:  General: No acute distress. Cardiovascular: Heart sounds show a regular rate, and rhythm. Lungs: Clear to auscultation bilaterally Abdomen: Soft, nontender, nondistended  Neurological: Alert. Moves all extremities 4. Cranial nerves II through XII grossly intact. Skin: Warm and dry. No rashes or lesions. Extremities: No clubbing or cyanosis. No edema.    Data Reviewed: I have personally reviewed following labs and imaging studies  CBC: Recent Labs  Lab 06/02/18 0513 06/03/18 0553 06/04/18 0422 06/05/18 0551 06/06/18 0435 06/07/18 0406 06/07/18 1601 06/08/18 0510  WBC 5.1 1.8* 1.0* 0.7* 0.7* 2.8* 6.4 11.9*  NEUTROABS 3.4 0.9* 0.3*  --  0.1* 1.5*  --   --   HGB 7.7* 8.2* 7.9* 7.6* 7.4* 7.6* 7.7* 7.6*  HCT 24.9* 26.1* 25.1* 24.2* 23.8* 24.7* 24.7* 24.6*  MCV 90.2 87.9 88.1 90.0 88.8 91.1 91.8 88.8  PLT 383 396 321 230 210 180 177 078   Basic Metabolic Panel: Recent Labs  Lab 06/02/18 0513  06/05/18 0551 06/06/18 0435 06/07/18 0406 06/07/18 1601 06/08/18 0510  NA 135   < > 132* 134* 135 133* 133*  K 4.3   < > 4.1 4.3 4.2 3.9 3.9  CL 101   < > 98 101 102 98 97*  CO2 26   < > 26 27 24 26 27   GLUCOSE 89   < > 80 86 83 135* 99  BUN 26*   < > 23 27* 24* 25* 25*    CREATININE 0.51   < > 0.45 0.47 0.48 0.69 0.53  CALCIUM 7.9*   < > 8.0* 8.2* 8.4* 8.4* 8.3*  MG 2.6*  --   --   --  2.4  --  2.2   < > = values in this interval not displayed.   GFR: Estimated Creatinine Clearance: 51.3 mL/min (by C-G formula based on SCr of 0.53 mg/dL). Liver Function Tests: Recent Labs  Lab 06/06/18 0435 06/07/18 0406 06/08/18 0510  AST 12* 12* 14*  ALT 22 20 21   ALKPHOS 128* 150* 164*  BILITOT 0.9 0.2* 0.4  PROT 5.0* 5.2* 5.0*  ALBUMIN 2.4* 2.4* 2.4*   No results for input(s): LIPASE, AMYLASE in the last 168 hours. No results for input(s): AMMONIA in the last 168 hours. Coagulation Profile: No results for input(s): INR, PROTIME in the last 168 hours. Cardiac Enzymes: No results for input(s): CKTOTAL, CKMB, CKMBINDEX, TROPONINI in the last 168 hours. BNP (last 3 results) No results for input(s): PROBNP in the last 8760 hours. HbA1C: No results for input(s): HGBA1C in the last 72 hours. CBG: No results for input(s): GLUCAP in the last 168 hours. Lipid Profile: No results for input(s): CHOL, HDL, LDLCALC, TRIG, CHOLHDL, LDLDIRECT in the last 72 hours. Thyroid Function Tests: No results for input(s): TSH, T4TOTAL, FREET4, T3FREE, THYROIDAB in the last 72 hours. Anemia Panel: No results for input(s): VITAMINB12, FOLATE, FERRITIN, TIBC, IRON, RETICCTPCT in the last 72 hours. Sepsis Labs: Recent Labs  Lab 06/07/18 1601  LATICACIDVEN 1.9    Recent Results (from the past 240 hour(s))  Culture, Urine     Status: Abnormal   Collection Time: 05/30/18  5:29 PM  Result Value Ref Range Status   Specimen Description   Final    URINE, CLEAN CATCH Performed at [facility] [address] [place], [place] [address]    Special Requests   Final    NONE Performed at [facility], [place] [address], [place], [place] [address]    Culture (A)  Final    >=100,000 COLONIES/mL ESCHERICHIA COLI Confirmed Extended Spectrum  Beta-Lactamase Producer (ESBL).  In bloodstream infections from ESBL organisms, carbapenems are preferred over piperacillin/tazobactam. They are shown to have a lower risk of mortality.    Report Status 06/02/2018 FINAL  Final   Organism ID, Bacteria ESCHERICHIA COLI (A)  Final      Susceptibility   Escherichia coli - MIC*    AMPICILLIN >=32 RESISTANT Resistant     CEFAZOLIN >=64 RESISTANT Resistant     CEFTRIAXONE >=64 RESISTANT Resistant     CIPROFLOXACIN 0.5 SENSITIVE Sensitive     GENTAMICIN >=16 RESISTANT Resistant     IMIPENEM <=0.25 SENSITIVE Sensitive     NITROFURANTOIN <=16 SENSITIVE Sensitive     TRIMETH/SULFA >=320 RESISTANT Resistant     AMPICILLIN/SULBACTAM 16 INTERMEDIATE Intermediate     PIP/TAZO <=4 SENSITIVE Sensitive     Extended ESBL POSITIVE Resistant     * >=100,000 COLONIES/mL ESCHERICHIA COLI         Radiology Studies: No results found.      Scheduled Meds: . apixaban  5 mg Oral BID  . dexamethasone  4 mg Oral Daily  . feeding supplement (ENSURE ENLIVE)  237 mL Oral BID BM  . fentaNYL  25 mcg Transdermal Q72H  . levothyroxine  75 mcg Oral QAC breakfast  . lidocaine  2 patch Transdermal Q24H  . nystatin  5 mL Oral QID  . pantoprazole  40 mg Oral BID WC  . polyethylene glycol  17 g Oral BID  . pregabalin  25 mg Oral BID  . senna  1 tablet Oral BID  . sodium chloride flush  3 mL Intravenous Q12H  . sucralfate  1 g Oral TID WC & HS   Continuous Infusions: . ertapenem 1,000 mg (06/08/18 1803)     LOS: 9 days    Time spent: over 72 min    Fayrene Helper, MD [facility] Pager [phone]  If 7PM-7AM, please contact night-coverage [email or web address] Password [email or web address] 06/08/2018, 7:37 PM

## 2018-06-09 LAB — COMPREHENSIVE METABOLIC PANEL
ALT: 20 U/L (ref 0–44)
AST: 14 U/L — AB (ref 15–41)
Albumin: 2.4 g/dL — ABNORMAL LOW (ref 3.5–5.0)
Alkaline Phosphatase: 165 U/L — ABNORMAL HIGH (ref 38–126)
Anion gap: 9 (ref 5–15)
BUN: 21 mg/dL (ref 8–23)
CO2: 29 mmol/L (ref 22–32)
CREATININE: 0.48 mg/dL (ref 0.44–1.00)
Calcium: 8.9 mg/dL (ref 8.9–10.3)
Chloride: 96 mmol/L — ABNORMAL LOW (ref 98–111)
GFR calc Af Amer: >60 mL/min (ref >60)
GFR calc non Af Amer: >60 mL/min (ref >60)
Glucose, Bld: 88 mg/dL (ref 70–99)
Potassium: 4.2 mmol/L (ref 3.5–5.1)
Sodium: 134 mmol/L — ABNORMAL LOW (ref 135–145)
Total Bilirubin: 0.6 mg/dL (ref 0.3–1.2)
Total Protein: 5 g/dL — ABNORMAL LOW (ref 6.5–8.1)

## 2018-06-09 LAB — CBC
HCT: 26.4 % — ABNORMAL LOW (ref 36.0–46.0)
Hemoglobin: 8 g/dL — ABNORMAL LOW (ref 12.0–15.0)
MCH: 27.5 pg (ref 26.0–34.0)
MCHC: 30.3 g/dL (ref 30.0–36.0)
MCV: 90.7 fL (ref 80.0–100.0)
Platelets: 159 10*3/uL (ref 150–400)
RBC: 2.91 MIL/uL — AB (ref 3.87–5.11)
RDW: 17.7 % — ABNORMAL HIGH (ref 11.5–15.5)
WBC: 15.5 10*3/uL — ABNORMAL HIGH (ref 4.0–10.5)
nRBC: 1.4 % — ABNORMAL HIGH (ref 0.0–0.2)

## 2018-06-09 LAB — MAGNESIUM: Magnesium: 2.3 mg/dL (ref 1.7–2.4)

## 2018-06-09 NOTE — Progress Notes (Signed)
PROGRESS NOTE    [name] [name]  VPX:[id number] DOB: 31-Jul-1940 DOA: 05/30/2018 PCP: Marin Olp, MD   Brief Narrative:  77 year old with history of small cell lung cancer with metastasis to liver and spine with cord compression on systemic chemotherapy and palliative radiotherapy, hypothyroidism, diverticulosis came to [facility] long ED from oncology office with complaints of abdominal pain and bright red blood per rectum.  She was initially noted to be dehydrated and hypotensive.  Echocardiogram showed left ventricular thrombus and cardiology recommended starting her on anticoagulation.   Assessment & Plan:   Principal Problem:   Left ventricular thrombus without MI Active Problems:   Hypothyroidism   Spinal cord compression due to malignant neoplasm metastatic to spine (HCC)   Small cell carcinoma of lower lobe of left lung (HCC)   BRBPR (bright red blood per rectum)   Complete lesion at T6 level of thoracic spinal cord (HCC)   Urinary and fecal incontinence   Rectal bleeding   ESBL (extended spectrum beta-lactamase) producing bacteria infection   Emphysematous cystitis  Left ventricular thrombus -Appreciate cardiology input.  CT shows visualization of 1.9 X1.2X 1.9 cm apical left ventricular clot. -Continue Eliquis -We will need outpatient echocardiogram for follow-up and also follow-up with cardiology  Bright red blood per rectum - GI consulted and thought this was secondary to rectal mucosal bleeding or internal hemorrhoid bleeding.  GI recommends close follow-up otherwise no acute intervention at this time.  Neutropenia has improved  Bilateral ankle swelling -Ultrasound is negative for DVT.  Holding off on steroid.  Was on outpatient steroids for radiation  Emphysematous cystitis with mild right-sided hydro-nephrosis and hydroureter - Seen on CT of the abdomen pelvis 12/12.  Due to her improved no compromise states she was started on meropenem.  She has history of  ESBL UTI. -Plan to treat this with Invanz for total of 10-14 days  Extensive small cell lung cancer with metastases - Management per oncology team.  Consulted palliative care team to help establish goals of care given her advanced malignancy  Severe indigestion -PPI  Chronic cancer pain -Pain control  Hypothyroidism -Continue Synthroid  In the setting of LV thrombus and GI bleed, patient understands she is at risk of bleeding and would like to proceed with anticoagulation.  Oncology on 12/14-patient has terminal condition with median survival from current treatment ranging from 9-12 months.  DVT prophylaxis: Eliquis 5 mg twice daily Code Status: DNR Family Communication: None at bedside Disposition Plan:   Consultants:   Oncology  Cardiology  Palliative care  GI  Procedures:   Lower extremity Dopplers-negative for DVT  Echocardiogram-left LV thrombus    Subjective: Appears somewhat in dilemma in regards to her malignancy treatment.  Overall poor oral intake.  Review of Systems Otherwise negative except as per HPI, including: General: Denies fever, chills, night sweats or unintended weight loss. Resp: Denies cough, wheezing, shortness of breath. Cardiac: Denies chest pain, palpitations, orthopnea, paroxysmal nocturnal dyspnea. GI: Denies abdominal pain, nausea, vomiting, diarrhea or constipation GU: Denies dysuria, frequency, hesitancy or incontinence MS: Denies muscle aches, joint pain or swelling Neuro: Denies headache, neurologic deficits (focal weakness, numbness, tingling), abnormal gait Psych: Denies anxiety, depression, SI/HI/AVH Skin: Denies new rashes or lesions ID: Denies sick contacts, exotic exposures, travel  Objective: Vitals:   06/08/18 2026 06/09/18 0450 06/09/18 0500 06/09/18 1306  BP: (!) 155/98 (!) 141/75  90/60  Pulse: 69 66  80  Resp: 16 16  20   Temp: 98.1 F (36.7 C) 98.2  F (36.8 C)  98.2 F (36.8 C)  TempSrc: Oral Oral  Oral    SpO2: 93% 92%  93%  Weight:   64.2 kg   Height:        Intake/Output Summary (Last 24 hours) at 06/09/2018 1535 Last data filed at 06/09/2018 1244 Gross per 24 hour  Intake 323 ml  Output 2100 ml  Net -1777 ml   Filed Weights   06/05/18 0348 06/06/18 0634 06/09/18 0500  Weight: 63.8 kg 64.8 kg 64.2 kg    Examination:  General exam: Appears calm and comfortable  Respiratory system: Clear to auscultation. Respiratory effort normal. Cardiovascular system: S1 & S2 heard, RRR. No JVD, murmurs, rubs, gallops or clicks. No pedal edema. Gastrointestinal system: Abdomen is nondistended, soft and nontender. No organomegaly or masses felt. Normal bowel sounds heard. Central nervous system: Alert and oriented. No focal neurological deficits. Extremities: Symmetric 5 x 5 power. Skin: No rashes, lesions or ulcers Psychiatry: Judgement and insight appear normal. Mood & affect appropriate.     Data Reviewed:   CBC: Recent Labs  Lab 06/03/18 0553 06/04/18 0422  06/06/18 0435 06/07/18 0406 06/07/18 1601 06/08/18 0510 06/09/18 0413  WBC 1.8* 1.0*   < > 0.7* 2.8* 6.4 11.9* 15.5*  NEUTROABS 0.9* 0.3*  --  0.1* 1.5*  --   --   --   HGB 8.2* 7.9*   < > 7.4* 7.6* 7.7* 7.6* 8.0*  HCT 26.1* 25.1*   < > 23.8* 24.7* 24.7* 24.6* 26.4*  MCV 87.9 88.1   < > 88.8 91.1 91.8 88.8 90.7  PLT 396 321   < > 210 180 177 177 159   < > = values in this interval not displayed.   Basic Metabolic Panel: Recent Labs  Lab 06/06/18 0435 06/07/18 0406 06/07/18 1601 06/08/18 0510 06/09/18 0413  NA 134* 135 133* 133* 134*  K 4.3 4.2 3.9 3.9 4.2  CL 101 102 98 97* 96*  CO2 27 24 26 27 29   GLUCOSE 86 83 135* 99 88  BUN 27* 24* 25* 25* 21  CREATININE 0.47 0.48 0.69 0.53 0.48  CALCIUM 8.2* 8.4* 8.4* 8.3* 8.9  MG  --  2.4  --  2.2 2.3   GFR: Estimated Creatinine Clearance: 51.3 mL/min (by C-G formula based on SCr of 0.48 mg/dL). Liver Function Tests: Recent Labs  Lab 06/06/18 0435 06/07/18 0406  06/08/18 0510 06/09/18 0413  AST 12* 12* 14* 14*  ALT 22 20 21 20   ALKPHOS 128* 150* 164* 165*  BILITOT 0.9 0.2* 0.4 0.6  PROT 5.0* 5.2* 5.0* 5.0*  ALBUMIN 2.4* 2.4* 2.4* 2.4*   No results for input(s): LIPASE, AMYLASE in the last 168 hours. No results for input(s): AMMONIA in the last 168 hours. Coagulation Profile: No results for input(s): INR, PROTIME in the last 168 hours. Cardiac Enzymes: No results for input(s): CKTOTAL, CKMB, CKMBINDEX, TROPONINI in the last 168 hours. BNP (last 3 results) No results for input(s): PROBNP in the last 8760 hours. HbA1C: No results for input(s): HGBA1C in the last 72 hours. CBG: No results for input(s): GLUCAP in the last 168 hours. Lipid Profile: No results for input(s): CHOL, HDL, LDLCALC, TRIG, CHOLHDL, LDLDIRECT in the last 72 hours. Thyroid Function Tests: No results for input(s): TSH, T4TOTAL, FREET4, T3FREE, THYROIDAB in the last 72 hours. Anemia Panel: No results for input(s): VITAMINB12, FOLATE, FERRITIN, TIBC, IRON, RETICCTPCT in the last 72 hours. Sepsis Labs: Recent Labs  Lab 06/07/18 1601  LATICACIDVEN 1.9  Recent Results (from the past 240 hour(s))  Culture, Urine     Status: Abnormal   Collection Time: 05/30/18  5:29 PM  Result Value Ref Range Status   Specimen Description   Final    URINE, CLEAN CATCH Performed at [facility], [place] [address], [place], [place] [address]    Special Requests   Final    NONE Performed at [facility], [place] [address] [place], [place] [address]    Culture (A)  Final    >=100,000 COLONIES/mL ESCHERICHIA COLI Confirmed Extended Spectrum Beta-Lactamase Producer (ESBL).  In bloodstream infections from ESBL organisms, carbapenems are preferred over piperacillin/tazobactam. They are shown to have a lower risk of mortality.    Report Status 06/02/2018 FINAL  Final   Organism ID, Bacteria ESCHERICHIA COLI (A)  Final      Susceptibility   Escherichia  coli - MIC*    AMPICILLIN >=32 RESISTANT Resistant     CEFAZOLIN >=64 RESISTANT Resistant     CEFTRIAXONE >=64 RESISTANT Resistant     CIPROFLOXACIN 0.5 SENSITIVE Sensitive     GENTAMICIN >=16 RESISTANT Resistant     IMIPENEM <=0.25 SENSITIVE Sensitive     NITROFURANTOIN <=16 SENSITIVE Sensitive     TRIMETH/SULFA >=320 RESISTANT Resistant     AMPICILLIN/SULBACTAM 16 INTERMEDIATE Intermediate     PIP/TAZO <=4 SENSITIVE Sensitive     Extended ESBL POSITIVE Resistant     * >=100,000 COLONIES/mL ESCHERICHIA COLI         Radiology Studies: No results found.      Scheduled Meds: . apixaban  5 mg Oral BID  . dexamethasone  4 mg Oral Daily  . feeding supplement (ENSURE ENLIVE)  237 mL Oral BID BM  . fentaNYL  12.5 mcg Transdermal Q72H   And  . fentaNYL  25 mcg Transdermal Q72H  . levothyroxine  75 mcg Oral QAC breakfast  . lidocaine  2 patch Transdermal Q24H  . nystatin  5 mL Oral QID  . pantoprazole  40 mg Oral BID WC  . polyethylene glycol  17 g Oral BID  . pregabalin  25 mg Oral BID  . senna  1 tablet Oral BID  . sodium chloride flush  3 mL Intravenous Q12H  . sucralfate  1 g Oral TID WC & HS   Continuous Infusions: . ertapenem Stopped (06/08/18 1834)     LOS: 10 days   Time spent= 25 mins    Ankit Arsenio Loader, MD [facility] Pager [phone]   If 7PM-7AM, please contact night-coverage [email or web address] Password [facility] 06/09/2018, 3:35 PM

## 2018-06-10 LAB — BASIC METABOLIC PANEL
Anion gap: 6 (ref 5–15)
BUN: 25 mg/dL — ABNORMAL HIGH (ref 8–23)
CO2: 26 mmol/L (ref 22–32)
Calcium: 8.2 mg/dL — ABNORMAL LOW (ref 8.9–10.3)
Chloride: 98 mmol/L (ref 98–111)
Creatinine, Ser: 0.53 mg/dL (ref 0.44–1.00)
GFR calc Af Amer: >60 mL/min (ref >60)
GFR calc non Af Amer: >60 mL/min (ref >60)
Glucose, Bld: 83 mg/dL (ref 70–99)
Potassium: 3.9 mmol/L (ref 3.5–5.1)
Sodium: 130 mmol/L — ABNORMAL LOW (ref 135–145)

## 2018-06-10 LAB — CBC
HCT: 26.1 % — ABNORMAL LOW (ref 36.0–46.0)
HEMOGLOBIN: 8 g/dL — AB (ref 12.0–15.0)
MCH: 28 pg (ref 26.0–34.0)
MCHC: 30.7 g/dL (ref 30.0–36.0)
MCV: 91.3 fL (ref 80.0–100.0)
NRBC: 2.3 % — AB (ref 0.0–0.2)
Platelets: 201 10*3/uL (ref 150–400)
RBC: 2.86 MIL/uL — AB (ref 3.87–5.11)
RDW: 17.9 % — ABNORMAL HIGH (ref 11.5–15.5)
WBC: 15.1 10*3/uL — ABNORMAL HIGH (ref 4.0–10.5)

## 2018-06-10 LAB — MAGNESIUM: Magnesium: 2.3 mg/dL (ref 1.7–2.4)

## 2018-06-10 MED ORDER — TRAZODONE HCL 50 MG PO TABS
50.0000 mg | ORAL_TABLET | Freq: Every day | ORAL | Status: DC
  Administered 2018-06-10 – 2018-06-20 (×10): 50 mg via ORAL
  Filled 2018-06-10 (×11): qty 1

## 2018-06-10 MED ORDER — FAMOTIDINE IN NACL 20-0.9 MG/50ML-% IV SOLN
20.0000 mg | Freq: Two times a day (BID) | INTRAVENOUS | Status: DC
  Administered 2018-06-10 – 2018-06-13 (×7): 20 mg via INTRAVENOUS
  Filled 2018-06-10 (×7): qty 50

## 2018-06-10 NOTE — Progress Notes (Signed)
Script for wig in shadow chart for patient to take-Nsg aware.

## 2018-06-10 NOTE — Progress Notes (Signed)
I met with [name] [name] and her son at their request today. [name] [name] have made the following decisions regarding her care:  No rehab, she is not able  No Chemotherapy, she does not believe there will be benefit  She is fine with taking anticoagulation such as Eloquis  She is agreeable to hospice but does say she has had bad experiences with hospice and family members in the past  She continues to need IV prn pain medication from severe pain at T6 lesion -burning and lancing pain around her chest- she has a PICC and I would leave this in place for hospice use for symptom manegement.  She is bedbound-maintain foley catheter  She wants to go to a LTC facility with [facility]  She has a LTC policy to cover expenses and a cancer policy- her sons have filed a claim- I updated social work  She should remain in the hospital since she needs continued IV meds until we can confirm LTC placement with hospice care.  She has considerable pain with movement so I advise minimizing transports.  Her prognosis is likely just a few weeks at best-but for now she is alert and interacting well-she has capacity for QOL in the time she has left.  Will follow and assist as needed with coordination of her care.  Lane Hacker, DO Palliative Medicine [phone]  Time;35 min Greater than 50%  of this time was spent counseling and coordinating care related to the above assessment and plan.

## 2018-06-10 NOTE — Progress Notes (Signed)
[facility]-Dr. Worthy Flank nurse- left vm about process for script for a wig-await response.

## 2018-06-10 NOTE — Progress Notes (Signed)
PT Cancellation Note  Patient Details Name: [name] [name] MRN: [id number] DOB: 01/24/1941   Cancelled Treatment:    Reason Eval/Treat Not Completed: Medical issues which prohibited therapy(pt reports she's still feeling nauseous and isn't able to tolerate activity at present, she declined PT. Will follow. )  Philomena Doheny PT 06/10/2018  Acute Rehabilitation Services Pager [phone] Office [phone]

## 2018-06-10 NOTE — Progress Notes (Signed)
PT Cancellation Note  Patient Details Name: LACHANDA BUCZEK MRN: [id number] DOB: 1941/03/02   Cancelled Treatment:    Reason Eval/Treat Not Completed: Medical issues which prohibited therapy(pt is having nausea and vomiting, will follow.)  Philomena Doheny PT 06/10/2018  Acute Rehabilitation Services Pager [phone] Office [phone]

## 2018-06-10 NOTE — Progress Notes (Signed)
CSW updated SN[facility], patient not medically stable for transfer today. Patient insurance authorization expires 12/23 and will need to be extended. Patient will need updated PT notes sent to [facility] today for extension. CSW notified physical therapy and the nurse.   Kathrin Greathouse, Marlinda Mike, MSW Clinical Social Worker  [phone] 06/10/2018  12:38 PM

## 2018-06-10 NOTE — Care Management Important Message (Signed)
Important Message  Patient Details  Name: [name] [name] MRN: [id number] Date of Birth: 08-22-1940   Medicare Important Message Given:  Yes    Kerin Salen 06/10/2018, 11:06 AMImportant Message  Patient Details  Name: [name] [name] MRN: [id number] Date of Birth: [birth year]   Medicare Important Message Given:  Yes    Kerin Salen 06/10/2018, 11:06 AM

## 2018-06-10 NOTE — Progress Notes (Signed)
PROGRESS NOTE    [name] [name]  ZOX:[id number] DOB: May 02, 1941 DOA: 05/30/2018 PCP: Marin Olp, MD   Brief Narrative:  77 year old with history of small cell lung cancer with metastasis to liver and spine with cord compression on systemic chemotherapy and palliative radiotherapy, hypothyroidism, diverticulosis came to [facility] long ED from oncology office with complaints of abdominal pain and bright red blood per rectum.  She was initially noted to be dehydrated and hypotensive.  Echocardiogram showed left ventricular thrombus and cardiology recommended starting her on anticoagulation.   Assessment & Plan:   Principal Problem:   Left ventricular thrombus without MI Active Problems:   Hypothyroidism   Spinal cord compression due to malignant neoplasm metastatic to spine (HCC)   Small cell carcinoma of lower lobe of left lung (HCC)   BRBPR (bright red blood per rectum)   Complete lesion at T6 level of thoracic spinal cord (HCC)   Urinary and fecal incontinence   Rectal bleeding   ESBL (extended spectrum beta-lactamase) producing bacteria infection   Emphysematous cystitis  Left ventricular thrombus -Appreciate cardiology input.  CT shows visualization of 1.9 X1.2X 1.9 cm apical left ventricular clot. -Cont Eliquis.  - Eventually will need outpatient follow-up echocardiogram  Bright red blood per rectum; subsided for now.  - GI consulted and thought this was secondary to rectal mucosal bleeding or internal hemorrhoid bleeding.  GI recommends close follow-up otherwise no acute intervention at this time.  Neutropenia has improved  Bilateral ankle swelling; stable.  -Ultrasound is negative for DVT.  Holding off on steroid.  Was on outpatient steroids for radiation  Emphysematous cystitis with mild right-sided hydro-nephrosis and hydroureter - Seen on CT of the abdomen pelvis 12/12.  Due to her improved no compromise states she was started on meropenem.  She has history of  ESBL UTI. -Plan to treat this with Invanz for total of 10-14 days  Extensive small cell lung cancer with metastases - After an extensive discussion with the patient's son and the patient, patient at this point is leaning towards normal chemotherapy.  Palliative care will see the patient again today to help with the decision as well.  Severe indigestion -Protonix twice daily, GI cocktail as needed, Carafate 3 times daily -We will add PPI  Chronic cancer pain -Pain control  Hypothyroidism -Continue Synthroid  In the setting of LV thrombus and GI bleed, patient understands she is at risk of bleeding and would like to proceed with anticoagulation.  Oncology on 12/14-patient has terminal condition with median survival from current treatment ranging from 9-12 months.  DVT prophylaxis: Eliquis 5 mg twice daily Code Status: DNR Family Communication: Son at bedside Disposition Plan: Maintain inpatient stay until determination regarding her long-term care is made. Patient requires 24-hour supervision, would benefit from skilled nursing facility.  We will plan on transitioning her to skilled nursing facility over next 24 hours  Consultants:   Oncology  Cardiology  Palliative care  GI  Procedures:   Lower extremity Dopplers-negative for DVT  Echocardiogram-left LV thrombus  Subjective: After extensive discussion with the patient and his son they are in agreement and are leaning towards that she does not want more chemotherapy as it takes a lot of toll on her body and wants to focus on more quality of life. States she has been having a lot of GERD  Review of Systems Otherwise negative except as per HPI, including: General = no fevers, chills, dizziness, malaise, fatigue HEENT/EYES = negative for pain, redness, loss of  vision, double vision, blurred vision, loss of hearing, sore throat, hoarseness, dysphagia Cardiovascular= negative for chest pain, palpitation, murmurs, lower  extremity swelling Respiratory/lungs= negative for shortness of breath, cough, hemoptysis, wheezing, mucus production Gastrointestinal= negative for nausea, vomiting,, abdominal pain, melena, hematemesis Genitourinary= negative for Dysuria, Hematuria, Change in Urinary Frequency MSK = Negative for arthralgia, myalgias, Back Pain, Joint swelling  Neurology= Negative for headache, seizures, numbness, tingling  Psychiatry= Negative for anxiety, depression, suicidal and homocidal ideation Allergy/Immunology= Medication/Food allergy as listed  Skin= Negative for Rash, lesions, ulcers, itching   Objective: Vitals:   06/09/18 0500 06/09/18 1306 06/09/18 2021 06/10/18 0456  BP:  90/60 93/63 112/77  Pulse:  80 79 77  Resp:  20 17 17   Temp:  98.2 F (36.8 C) 98.2 F (36.8 C) 98.1 F (36.7 C)  TempSrc:  Oral Oral Oral  SpO2:  93% 96% 96%  Weight: 64.2 kg   67.6 kg  Height:        Intake/Output Summary (Last 24 hours) at 06/10/2018 1110 Last data filed at 06/09/2018 1853 Gross per 24 hour  Intake 540 ml  Output 700 ml  Net -160 ml   Filed Weights   06/06/18 0634 06/09/18 0500 06/10/18 0456  Weight: 64.8 kg 64.2 kg 67.6 kg    Examination:  Constitutional: NAD, calm, comfortable, generally weak appearing Eyes: PERRL, lids and conjunctivae normal ENMT: Mucous membranes are moist. Posterior pharynx clear of any exudate or lesions.Normal dentition.  Neck: normal, supple, no masses, no thyromegaly Respiratory: clear to auscultation bilaterally, no wheezing, no crackles. Normal respiratory effort. No accessory muscle use.  Cardiovascular: Regular rate and rhythm, no murmurs / rubs / gallops. No extremity edema. 2+ pedal pulses. No carotid bruits.  Abdomen: no tenderness, no masses palpated. No hepatosplenomegaly. Bowel sounds positive.  Musculoskeletal: no clubbing / cyanosis. No joint deformity upper and lower extremities. Good ROM, no contractures. Normal muscle tone.  Skin: no  rashes, lesions, ulcers. No induration Neurologic: CN 2-12 grossly intact. Sensation intact, DTR normal. Strength 4/5 in all 4.  Psychiatric: Normal judgment and insight. Alert and oriented x 3. Normal mood.     Data Reviewed:   CBC: Recent Labs  Lab 06/04/18 0422  06/06/18 0435 06/07/18 0406 06/07/18 1601 06/08/18 0510 06/09/18 0413  WBC 1.0*   < > 0.7* 2.8* 6.4 11.9* 15.5*  NEUTROABS 0.3*  --  0.1* 1.5*  --   --   --   HGB 7.9*   < > 7.4* 7.6* 7.7* 7.6* 8.0*  HCT 25.1*   < > 23.8* 24.7* 24.7* 24.6* 26.4*  MCV 88.1   < > 88.8 91.1 91.8 88.8 90.7  PLT 321   < > 210 180 177 177 159   < > = values in this interval not displayed.   Basic Metabolic Panel: Recent Labs  Lab 06/06/18 0435 06/07/18 0406 06/07/18 1601 06/08/18 0510 06/09/18 0413  NA 134* 135 133* 133* 134*  K 4.3 4.2 3.9 3.9 4.2  CL 101 102 98 97* 96*  CO2 27 24 26 27 29   GLUCOSE 86 83 135* 99 88  BUN 27* 24* 25* 25* 21  CREATININE 0.47 0.48 0.69 0.53 0.48  CALCIUM 8.2* 8.4* 8.4* 8.3* 8.9  MG  --  2.4  --  2.2 2.3   GFR: Estimated Creatinine Clearance: 51.3 mL/min (by C-G formula based on SCr of 0.48 mg/dL). Liver Function Tests: Recent Labs  Lab 06/06/18 0435 06/07/18 0406 06/08/18 0510 06/09/18 0413  AST 12* 12*  14* 14*  ALT 22 20 21 20   ALKPHOS 128* 150* 164* 165*  BILITOT 0.9 0.2* 0.4 0.6  PROT 5.0* 5.2* 5.0* 5.0*  ALBUMIN 2.4* 2.4* 2.4* 2.4*   No results for input(s): LIPASE, AMYLASE in the last 168 hours. No results for input(s): AMMONIA in the last 168 hours. Coagulation Profile: No results for input(s): INR, PROTIME in the last 168 hours. Cardiac Enzymes: No results for input(s): CKTOTAL, CKMB, CKMBINDEX, TROPONINI in the last 168 hours. BNP (last 3 results) No results for input(s): PROBNP in the last 8760 hours. HbA1C: No results for input(s): HGBA1C in the last 72 hours. CBG: No results for input(s): GLUCAP in the last 168 hours. Lipid Profile: No results for input(s): CHOL,  HDL, LDLCALC, TRIG, CHOLHDL, LDLDIRECT in the last 72 hours. Thyroid Function Tests: No results for input(s): TSH, T4TOTAL, FREET4, T3FREE, THYROIDAB in the last 72 hours. Anemia Panel: No results for input(s): VITAMINB12, FOLATE, FERRITIN, TIBC, IRON, RETICCTPCT in the last 72 hours. Sepsis Labs: Recent Labs  Lab 06/07/18 1601  LATICACIDVEN 1.9    No results found for this or any previous visit (from the past 240 hour(s)).       Radiology Studies: No results found.      Scheduled Meds: . apixaban  5 mg Oral BID  . dexamethasone  4 mg Oral Daily  . feeding supplement (ENSURE ENLIVE)  237 mL Oral BID BM  . fentaNYL  12.5 mcg Transdermal Q72H   And  . fentaNYL  25 mcg Transdermal Q72H  . levothyroxine  75 mcg Oral QAC breakfast  . lidocaine  2 patch Transdermal Q24H  . nystatin  5 mL Oral QID  . pantoprazole  40 mg Oral BID WC  . polyethylene glycol  17 g Oral BID  . pregabalin  25 mg Oral BID  . senna  1 tablet Oral BID  . sodium chloride flush  3 mL Intravenous Q12H  . sucralfate  1 g Oral TID WC & HS   Continuous Infusions: . ertapenem Stopped (06/09/18 1753)  . famotidine (PEPCID) IV       LOS: 11 days   Time spent= 35 mins    Ankit Arsenio Loader, MD [facility] Pager [phone]   If 7PM-7AM, please contact night-coverage [email or web address] Password [email or web address] 06/10/2018, 11:10 AM

## 2018-06-11 ENCOUNTER — Inpatient Hospital Stay: Payer: Medicare Other

## 2018-06-11 MED ORDER — SODIUM CHLORIDE 0.9 % IV SOLN
INTRAVENOUS | Status: DC | PRN
  Administered 2018-06-11: 1000 mL via INTRAVENOUS
  Administered 2018-06-14 – 2018-06-18 (×3): 250 mL via INTRAVENOUS
  Administered 2018-06-19: 1000 mL via INTRAVENOUS

## 2018-06-11 NOTE — Progress Notes (Signed)
Physical Therapy Treatment Patient Details Name: [name] [name] MRN: [id number] DOB: 10/21/1940 Today's Date: 06/11/2018    History of Present Illness  77 y.o. female with a hx significant for small cell lung CA w/ metastases to the liver and spine with cord compression on chemo and palliative radiation, HLD, tobacco abuse, hypothyroidism and diverticulosis, who presented from oncologist office due to abdominal pain, increased lethargy and BRBPR.   Patient with an incidental finding of a 1.9 x 1.2 x 1.9 cm apical left.    PT Comments    Pt is confused, reports seeing things flying around the room and black spots on her legs, likely due to pain meds per RN. Pt sat edge of bed with sudden loss of balance posteriorly x 3. Sit to stand with +2 mod assist, pt's LEs buckled after standing just a few seconds.    Follow Up Recommendations  Supervision/Assistance - 24 hour;SNF     Equipment Recommendations  None recommended by PT    Recommendations for Other Services       Precautions / Restrictions Precautions Precautions: Fall Precaution Comments: pubic ramus fracture in October, mets to spine  Restrictions Weight Bearing Restrictions: No Other Position/Activity Restrictions: WBAT    Mobility  Bed Mobility Overal bed mobility: Needs Assistance Bed Mobility: Rolling;Sit to Supine;Supine to Sit Rolling: Min assist Sidelying to sit: Min assist Supine to sit: HOB elevated;Min assist Sit to supine: Min assist;HOB elevated   General bed mobility comments: Min assist for completion of bilateral rolling during pericare (pt soiled in feces and urine). Min A for supine to sit with increased time. Pt sat at edge of bed for ~1 minute, stated she, "didn't want to do this anymore" and suddenly returned to supine.  Pt mod I with scooting self up in bed with use of bedrails. Subsided with rest.   Transfers Overall transfer level: Needs assistance(NT - pt dizzy and nauseous upon sitting EOB and  needed to return to supine position) Equipment used: Rolling walker (2 wheeled) Transfers: Sit to/from Stand Sit to Stand: Mod assist +2 assist         General transfer comment: stood for ~5 seconds, then LEs buckled, assisted pt safely to bed  Ambulation/Gait                 Stairs             Wheelchair Mobility    Modified Rankin (Stroke Patients Only)       Balance     Sitting balance-Leahy Scale: Poor Sitting balance - Comments: maintains neutral briefly, had sudden LOB posteriorly x 3 requiring mod A   Standing balance support: Bilateral upper extremity supported Standing balance-Leahy Scale: Zero Standing balance comment: LEs buckle                            Cognition Arousal/Alertness: Awake/alert   Overall Cognitive Status: Impaired/Different from baseline Area of Impairment: Memory;Following commands;Problem solving;Orientation;Safety/judgement;Awareness                       Following Commands: Follows one step commands inconsistently Safety/Judgement: Decreased awareness of safety;Decreased awareness of deficits   Problem Solving: Requires verbal cues;Requires tactile cues General Comments: pt is confused, seems to be hallucinating, RN stated this may be due to pain meds      Exercises      General Comments        Pertinent Vitals/Pain  Faces Pain Scale: No hurt    Home Living                      Prior Function            PT Goals (current goals can now be found in the care plan section) Acute Rehab PT Goals Patient Stated Goal: DC SNF with hospice PT Goal Formulation: With patient Time For Goal Achievement: 06/14/18 Potential to Achieve Goals: Fair Progress towards PT goals: Progressing toward goals    Frequency    Min 2X/week      PT Plan Current plan remains appropriate    Co-evaluation              AM-PAC PT "6 Clicks" Mobility   Outcome Measure  Help needed turning  from your back to your side while in a flat bed without using bedrails?: A Little Help needed moving from lying on your back to sitting on the side of a flat bed without using bedrails?: A Lot Help needed moving to and from a bed to a chair (including a wheelchair)?: A Lot Help needed standing up from a chair using your arms (e.g., wheelchair or bedside chair)?: A Lot Help needed to walk in hospital room?: A Lot Help needed climbing 3-5 steps with a railing? : Total 6 Click Score: 12    End of Session Equipment Utilized During Treatment: Gait belt Activity Tolerance: Patient limited by fatigue Patient left: in bed;with call bell/phone within reach;with bed alarm set;with nursing/sitter in room Nurse Communication: Mobility status PT Visit Diagnosis: Muscle weakness (generalized) (M62.81);Other abnormalities of gait and mobility (R26.89)     Time: 1638-4536 PT Time Calculation (min) (ACUTE ONLY): 14 min  Charges:  $Therapeutic Activity: 8-22 mins                     Blondell Reveal Kistler PT 06/11/2018  Acute Rehabilitation Services Pager [phone] Office [phone]

## 2018-06-11 NOTE — Progress Notes (Signed)
PROGRESS NOTE    [name] [name]  WNU:[id number] DOB: 29-Oct-1940 DOA: 05/30/2018 PCP: Marin Olp, MD   Brief Narrative:  77 year old with history of small cell lung cancer with metastasis to liver and spine with cord compression on systemic chemotherapy and palliative radiotherapy, hypothyroidism, diverticulosis came to [facility] long ED from oncology office with complaints of abdominal pain and bright red blood per rectum.  She was initially noted to be dehydrated and hypotensive.  Echocardiogram showed left ventricular thrombus and cardiology recommended starting her on anticoagulation.  Palliative care consulted and patient has opted to go under hospice care at long-term care facility.  Currently awaiting authorization from her insurance/from cancer policy   Assessment & Plan:   Principal Problem:   Left ventricular thrombus without MI Active Problems:   Hypothyroidism   Spinal cord compression due to malignant neoplasm metastatic to spine (HCC)   Small cell carcinoma of lower lobe of left lung (HCC)   BRBPR (bright red blood per rectum)   Complete lesion at T6 level of thoracic spinal cord (HCC)   Urinary and fecal incontinence   Rectal bleeding   ESBL (extended spectrum beta-lactamase) producing bacteria infection   Emphysematous cystitis  Left ventricular thrombus -Appreciate cardiology input.  CT shows visualization of 1.9 X1.2X 1.9 cm apical left ventricular clot. -Opted to continue Eliquis while on hospice.  Need outpatient echocardiogram in the future.  Bright red blood per rectum; subsided for now.  - GI consulted and thought this was secondary to rectal mucosal bleeding or internal hemorrhoid bleeding.  GI recommends close follow-up otherwise no acute intervention at this time.  Neutropenia has improved  Bilateral ankle swelling; stable.  -Lower extremity ultrasound is negative for DVT.  Emphysematous cystitis with mild right-sided hydro-nephrosis and  hydroureter - Seen on CT of the abdomen pelvis 12/12.  Due to her improved no compromise states she was started on meropenem.  She has history of ESBL UTI. -We will stop Invanz today.  Extensive small cell lung cancer with metastases - After an extensive discussion with the patient's son and the patient, patient at this point is leaning towards nochemotherapy.  Appreciate palliative care input.  Plan is to transition patient to long-term care with hospice.  Awaiting insurance authorization under her hospice policy.  Severe indigestion -Protonix twice daily, GI cocktail as needed, Carafate 3 times daily -Continue PPI  Chronic cancer pain -Pain control  Hypothyroidism -Continue Synthroid  In the setting of LV thrombus and GI bleed, patient understands she is at risk of bleeding and would like to proceed with anticoagulation.  Oncology on 12/14-patient has terminal condition with median survival from current treatment ranging from 9-12 months.  DVT prophylaxis: Eliquis 5 mg twice daily Code Status: DNR Family Communication: None at bedside Disposition Plan: Awaiting insurance approval under hospice policy.  Social worker aware and will follow-up  Consultants:   Oncology  Cardiology  Palliative care  GI  Procedures:   Lower extremity Dopplers-negative for DVT  Echocardiogram-left LV thrombus  Subjective: Slight confusion this morning after getting her narcotics due to pain.  No focal deficits.  No alarming signs and symptoms.  Review of Systems Otherwise negative except as per HPI, including: General = no fevers, chills, dizziness, malaise, fatigue HEENT/EYES = negative for pain, redness, loss of vision, double vision, blurred vision, loss of hearing, sore throat, hoarseness, dysphagia Cardiovascular= negative for chest pain, palpitation, murmurs, lower extremity swelling Respiratory/lungs= negative for shortness of breath, cough, hemoptysis, wheezing, mucus  production Gastrointestinal=  negative for nausea, vomiting,, abdominal pain, melena, hematemesis Genitourinary= negative for Dysuria, Hematuria, Change in Urinary Frequency MSK = Negative for arthralgia, myalgias, Back Pain, Joint swelling  Neurology= Negative for headache, seizures, numbness, tingling  Psychiatry= Negative for anxiety, depression, suicidal and homocidal ideation Allergy/Immunology= Medication/Food allergy as listed  Skin= Negative for Rash, lesions, ulcers, itching    Objective: Vitals:   06/10/18 0456 06/10/18 1224 06/10/18 2217 06/11/18 0501  BP: 112/77 99/62 105/75 97/73  Pulse: 77 74 84 69  Resp: 17 16 18 16   Temp: 98.1 F (36.7 C) 97.6 F (36.4 C) 98.1 F (36.7 C) 97.7 F (36.5 C)  TempSrc: Oral Oral Oral Oral  SpO2: 96% 93% 93% 92%  Weight: 67.6 kg   64.1 kg  Height:        Intake/Output Summary (Last 24 hours) at 06/11/2018 1209 Last data filed at 06/11/2018 0900 Gross per 24 hour  Intake 232.52 ml  Output 1650 ml  Net -1417.48 ml   Filed Weights   06/09/18 0500 06/10/18 0456 06/11/18 0501  Weight: 64.2 kg 67.6 kg 64.1 kg    Examination:  Constitutional: NAD, calm, comfortable pleasantly confused Eyes: PERRL, lids and conjunctivae normal ENMT: Mucous membranes are moist. Posterior pharynx clear of any exudate or lesions.Normal dentition.  Neck: normal, supple, no masses, no thyromegaly Respiratory: clear to auscultation bilaterally, no wheezing, no crackles. Normal respiratory effort. No accessory muscle use.  Cardiovascular: Regular rate and rhythm, no murmurs / rubs / gallops. No extremity edema. 2+ pedal pulses. No carotid bruits.  Abdomen: no tenderness, no masses palpated. No hepatosplenomegaly. Bowel sounds positive.  Musculoskeletal: no clubbing / cyanosis. No joint deformity upper and lower extremities. Good ROM, no contractures. Normal muscle tone.  Skin: no rashes, lesions, ulcers. No induration Neurologic: CN 2-12 grossly intact.  Sensation intact, DTR normal. Strength 5/5 in all 4.  Psychiatric: Alert and oriented x 2.     Data Reviewed:   CBC: Recent Labs  Lab 06/06/18 0435 06/07/18 0406 06/07/18 1601 06/08/18 0510 06/09/18 0413 06/10/18 1110  WBC 0.7* 2.8* 6.4 11.9* 15.5* 15.1*  NEUTROABS 0.1* 1.5*  --   --   --   --   HGB 7.4* 7.6* 7.7* 7.6* 8.0* 8.0*  HCT 23.8* 24.7* 24.7* 24.6* 26.4* 26.1*  MCV 88.8 91.1 91.8 88.8 90.7 91.3  PLT 210 180 177 177 159 119   Basic Metabolic Panel: Recent Labs  Lab 06/07/18 0406 06/07/18 1601 06/08/18 0510 06/09/18 0413 06/10/18 1110  NA 135 133* 133* 134* 130*  K 4.2 3.9 3.9 4.2 3.9  CL 102 98 97* 96* 98  CO2 24 26 27 29 26   GLUCOSE 83 135* 99 88 83  BUN 24* 25* 25* 21 25*  CREATININE 0.48 0.69 0.53 0.48 0.53  CALCIUM 8.4* 8.4* 8.3* 8.9 8.2*  MG 2.4  --  2.2 2.3 2.3   GFR: Estimated Creatinine Clearance: 51.3 mL/min (by C-G formula based on SCr of 0.53 mg/dL). Liver Function Tests: Recent Labs  Lab 06/06/18 0435 06/07/18 0406 06/08/18 0510 06/09/18 0413  AST 12* 12* 14* 14*  ALT 22 20 21 20   ALKPHOS 128* 150* 164* 165*  BILITOT 0.9 0.2* 0.4 0.6  PROT 5.0* 5.2* 5.0* 5.0*  ALBUMIN 2.4* 2.4* 2.4* 2.4*   No results for input(s): LIPASE, AMYLASE in the last 168 hours. No results for input(s): AMMONIA in the last 168 hours. Coagulation Profile: No results for input(s): INR, PROTIME in the last 168 hours. Cardiac Enzymes: No results for  input(s): CKTOTAL, CKMB, CKMBINDEX, TROPONINI in the last 168 hours. BNP (last 3 results) No results for input(s): PROBNP in the last 8760 hours. HbA1C: No results for input(s): HGBA1C in the last 72 hours. CBG: No results for input(s): GLUCAP in the last 168 hours. Lipid Profile: No results for input(s): CHOL, HDL, LDLCALC, TRIG, CHOLHDL, LDLDIRECT in the last 72 hours. Thyroid Function Tests: No results for input(s): TSH, T4TOTAL, FREET4, T3FREE, THYROIDAB in the last 72 hours. Anemia Panel: No results for  input(s): VITAMINB12, FOLATE, FERRITIN, TIBC, IRON, RETICCTPCT in the last 72 hours. Sepsis Labs: Recent Labs  Lab 06/07/18 1601  LATICACIDVEN 1.9    No results found for this or any previous visit (from the past 240 hour(s)).       Radiology Studies: No results found.      Scheduled Meds: . apixaban  5 mg Oral BID  . dexamethasone  4 mg Oral Daily  . feeding supplement (ENSURE ENLIVE)  237 mL Oral BID BM  . fentaNYL  12.5 mcg Transdermal Q72H   And  . fentaNYL  25 mcg Transdermal Q72H  . levothyroxine  75 mcg Oral QAC breakfast  . lidocaine  2 patch Transdermal Q24H  . nystatin  5 mL Oral QID  . pantoprazole  40 mg Oral BID WC  . polyethylene glycol  17 g Oral BID  . pregabalin  25 mg Oral BID  . senna  1 tablet Oral BID  . sodium chloride flush  3 mL Intravenous Q12H  . sucralfate  1 g Oral TID WC & HS  . traZODone  50 mg Oral QHS   Continuous Infusions: . sodium chloride 10 mL/hr at 06/11/18 0900  . famotidine (PEPCID) IV 20 mg (06/11/18 1019)     LOS: 12 days   Time spent= 25 mins     Arsenio Loader, MD [facility] Pager [phone]   If 7PM-7AM, please contact night-coverage [email or web address] Password [facility] 06/11/2018, 12:09 PM

## 2018-06-11 NOTE — Progress Notes (Signed)
ANTICOAGULATION CONSULT NOTE -follow up  Pharmacy Consult for Eliquis Indication: apical LV mural thrombus  Allergies  Allergen Reactions  . Atorvastatin     Intolerant to all statins   . Cyclobenzaprine Other (See Comments)    Bradycardia, hypotension  . Ezetimibe      INTOL to Zetia  . Rosuvastatin     intolerant to all statins  . Gadolinium Derivatives Nausea Only    Pt became very nauseous after gadolinium administration//lh  . Nicoderm [Nicotine] Rash    Unable to use the patches---caused severe rash to area placed    Patient Measurements: Height: 5\' 5"  (165.1 cm) Weight: 141 lb 4.8 oz (64.1 kg) IBW/kg (Calculated) : 57  Vital Signs: Temp: 97.7 F (36.5 C) (12/24 0501) Temp Source: Oral (12/24 0501) BP: 97/73 (12/24 0501) Pulse Rate: 69 (12/24 0501)  Labs: Recent Labs    06/09/18 0413 06/10/18 1110  HGB 8.0* 8.0*  HCT 26.4* 26.1*  PLT 159 201  CREATININE 0.48 0.53    Estimated Creatinine Clearance: 51.3 mL/min (by C-G formula based on SCr of 0.53 mg/dL).   Medical History: Past Medical History:  Diagnosis Date  . Asymptomatic varicose veins   . Cataract    beginnings  . Cigarette smoker   . COPD (chronic obstructive pulmonary disease) ([place])   . Diverticulosis of colon (without mention of hemorrhage)   . Headache(784.0)   . Hearing loss    wears hearing aides  . Lumbar back pain   . Pure hypercholesterolemia   . Stress disorder, acute   . Varicose veins     Medications:  No anticoagulants PTA  Assessment: 77 yo F with complicated admission well known to pharmacy.  PMH significant for metastatic SCLC actively receiving chemotherapy & radiation who presented to hospital with rectal bleed.  Subsequently she was found to have apical LV clot & Ecoli-ESBL emphysematous cystitis during hospitalization.  12/20 Oncologist ok with starting anticoagulation (deferred to cardiology).  06/11/2018: CBC: Neutropenic on Granix x4 days, Hg has been stable  ~8.0, Pltc WNL.  No bleeding noted.   Renal: Scr at baseline (0.53 today)  Plan:   continue Eliquis 5mg  PO BID- no loading dose given patient's high risk for bleeding per discussion with MD  Monitor CBC and s/sx of bleeding  Education completed  Pharmacy will sign off  Dolly Rias RPh 06/11/2018, 7:16 AM Pager [phone]

## 2018-06-11 NOTE — Progress Notes (Signed)
Occupational Therapy Treatment Patient Details Name: [name] [name] MRN: [id number] DOB: October 02, 1940 Today's Date: 06/11/2018    History of present illness  77 y.o. female with a hx significant for small cell lung CA w/ metastases to the liver and spine with cord compression on chemo and palliative radiation, HLD, tobacco abuse, hypothyroidism and diverticulosis, who presented from oncologist office due to abdominal pain, increased lethargy and BRBPR.   Patient with an incidental finding of a 1.9 x 1.2 x 1.9 cm apical left.   OT comments  Pt motivated to work with OT for grooming and dressing as family was coming  Follow Up Recommendations  SNF    Equipment Recommendations  Other (comment)(defer to next venue)    Recommendations for Other Services      Precautions / Restrictions Precautions Precautions: Fall Precaution Comments: pubic ramus fracture in October, mets to spine  Restrictions Other Position/Activity Restrictions: WBAT       Mobility Bed Mobility Overal bed mobility: Needs Assistance Bed Mobility: Rolling Rolling: Min assist         General bed mobility comments: Mod A to sit up in bed for donning shirt  Transfers                          ADL either performed or assessed with clinical judgement   ADL Overall ADL's : Needs assistance/impaired     Grooming: Set up;Bed level   Upper Body Bathing: Minimal assistance;Bed level                             General ADL Comments: Pt wanted to get ready for visitors to come.  Pt donned make up and was VERY participative. Pt also wanted to don a shirt instead of a gown to see family.  Pt able to sit up in bed in long sitting in order to pull shirt down in theback.  Pt very pleased she was able to don a shirt for her visitors. Pt did decline OOB but was verty motivated to perform self care this day.               Cognition Arousal/Alertness: Awake/alert Behavior During Therapy: WFL  for tasks assessed/performed Overall Cognitive Status: Impaired/Different from baseline                                                     Pertinent Vitals/ Pain       Pain Assessment: No/denies pain         Frequency  Min 2X/week        Progress Toward Goals  OT Goals(current goals can now be found in the care plan section)  Progress towards OT goals: Progressing toward goals  Acute Rehab OT Goals Patient Stated Goal: DC SNF with hospice  Plan Discharge plan remains appropriate       AM-PAC OT "6 Clicks" Daily Activity     Outcome Measure   Help from another person eating meals?: None Help from another person taking care of personal grooming?: A Little Help from another person toileting, which includes using toliet, bedpan, or urinal?: Total(incontinent) Help from another person bathing (including washing, rinsing, drying)?: A Lot Help from another person to put on and taking off regular upper body  clothing?: A Little Help from another person to put on and taking off regular lower body clothing?: A Lot 6 Click Score: 15    End of Session    OT Visit Diagnosis: Other abnormalities of gait and mobility (R26.89);Muscle weakness (generalized) (M62.81);Pain Pain - part of body: (generalized)   Activity Tolerance Patient tolerated treatment well   Patient Left with call bell/phone within reach;with family/visitor present;in bed   Nurse Communication Mobility status        Time: 1401-1420 OT Time Calculation (min): 19 min  Charges: OT General Charges $OT Visit: 1 Visit OT Treatments $Self Care/Home Management : 8-22 mins  Kari Baars, [place] [phone] Office- [place], [name] 06/11/2018, 3:15 PM

## 2018-06-12 LAB — CBC
HCT: 26.1 % — ABNORMAL LOW (ref 36.0–46.0)
Hemoglobin: 8 g/dL — ABNORMAL LOW (ref 12.0–15.0)
MCH: 28.5 pg (ref 26.0–34.0)
MCHC: 30.7 g/dL (ref 30.0–36.0)
MCV: 92.9 fL (ref 80.0–100.0)
Platelets: 248 10*3/uL (ref 150–400)
RBC: 2.81 MIL/uL — ABNORMAL LOW (ref 3.87–5.11)
RDW: 18.6 % — ABNORMAL HIGH (ref 11.5–15.5)
WBC: 13.2 10*3/uL — ABNORMAL HIGH (ref 4.0–10.5)
nRBC: 1.5 % — ABNORMAL HIGH (ref 0.0–0.2)

## 2018-06-12 LAB — BASIC METABOLIC PANEL
ANION GAP: 9 (ref 5–15)
BUN: 23 mg/dL (ref 8–23)
CO2: 27 mmol/L (ref 22–32)
Calcium: 8.3 mg/dL — ABNORMAL LOW (ref 8.9–10.3)
Chloride: 100 mmol/L (ref 98–111)
Creatinine, Ser: 0.54 mg/dL (ref 0.44–1.00)
GFR calc Af Amer: >60 mL/min (ref >60)
GFR calc non Af Amer: >60 mL/min (ref >60)
Glucose, Bld: 85 mg/dL (ref 70–99)
Potassium: 4.2 mmol/L (ref 3.5–5.1)
Sodium: 136 mmol/L (ref 135–145)

## 2018-06-12 LAB — MAGNESIUM: Magnesium: 2.4 mg/dL (ref 1.7–2.4)

## 2018-06-12 NOTE — Progress Notes (Signed)
PROGRESS NOTE    LAYANI FORONDA  KGM:[id number] DOB: 1941-01-18 DOA: 05/30/2018 PCP: Marin Olp, MD   Brief Narrative:  77 year old with history of small cell lung cancer with metastasis to liver and spine with cord compression on systemic chemotherapy and palliative radiotherapy, hypothyroidism, diverticulosis came to [facility] long ED from oncology office with complaints of abdominal pain and bright red blood per rectum.  She was initially noted to be dehydrated and hypotensive.  Echocardiogram showed left ventricular thrombus and cardiology recommended starting her on anticoagulation.  Palliative care consulted and patient has opted to go under hospice care at long-term care facility.  Currently awaiting authorization from her insurance/from cancer policy.    Assessment & Plan:   Principal Problem:   Left ventricular thrombus without MI Active Problems:   Hypothyroidism   Spinal cord compression due to malignant neoplasm metastatic to spine (HCC)   Small cell carcinoma of lower lobe of left lung (HCC)   BRBPR (bright red blood per rectum)   Complete lesion at T6 level of thoracic spinal cord (HCC)   Urinary and fecal incontinence   Rectal bleeding   ESBL (extended spectrum beta-lactamase) producing bacteria infection   Emphysematous cystitis  Left ventricular thrombus -Appreciate cardiology input.  CT shows visualization of 1.9 X1.2X 1.9 cm apical left ventricular clot. - Continue Eliquis while on hospice.  Will need repeat echocardiogram in a few weeks to reevaluate this.  Bright red blood per rectum; subsided for now.  - GI consulted and thought this was secondary to rectal mucosal bleeding or internal hemorrhoid bleeding.  GI recommends close follow-up otherwise no acute intervention at this time.  Neutropenia has improved  Bilateral ankle swelling; stable.  -Lower extremity ultrasound is negative for DVT.  Emphysematous cystitis with mild right-sided hydro-nephrosis  and hydroureter; treated  - Seen on CT of the abdomen pelvis 12/12.  Due to her improved no compromise states she was started on meropenem.  She has history of ESBL UTI. Colbert Ewing stopped 12/24  Extensive small cell lung cancer with metastases - After an extensive discussion with the patient's son and the patient, patient at this point is leaning towards nochemotherapy.  Appreciate palliative care input.  Plan is to transition patient to long-term care with hospice.  Awaiting insurance authorization under her hospice policy.  Severe indigestion -Protonix twice daily, GI cocktail as needed, Carafate 3 times daily -Continue PPI  Chronic cancer pain -Pain control  Hypothyroidism -Continue Synthroid  In the setting of LV thrombus and GI bleed, patient understands she is at risk of bleeding and would like to proceed with anticoagulation.  Oncology on 12/14-patient has terminal condition with median survival from current treatment ranging from 9-12 months.  DVT prophylaxis: Eliquis  Code Status: DNR Family Communication: Son Disposition Plan: Awaiting insurance approval under hospice policy.  Social worker aware and will follow-up  Consultants:   Oncology  Cardiology  Palliative care  GI  Procedures:   Lower extremity Dopplers-negative for DVT  Echocardiogram-left LV thrombus  Subjective: No complaints this morning besides reflux despite maximizing her medications.   Review of Systems Otherwise negative except as per HPI, including: General = no fevers, chills, dizziness, malaise, fatigue HEENT/EYES = negative for pain, redness, loss of vision, double vision, blurred vision, loss of hearing, sore throat, hoarseness, dysphagia Cardiovascular= negative for chest pain, palpitation, murmurs, lower extremity swelling Respiratory/lungs= negative for shortness of breath, cough, hemoptysis, wheezing, mucus production Gastrointestinal= negative for nausea, vomiting,, abdominal pain,  melena, hematemesis Genitourinary= negative  for Dysuria, Hematuria, Change in Urinary Frequency MSK = Negative for arthralgia, myalgias, Back Pain, Joint swelling  Neurology= Negative for headache, seizures, numbness, tingling  Psychiatry= Negative for anxiety, depression, suicidal and homocidal ideation Allergy/Immunology= Medication/Food allergy as listed  Skin= Negative for Rash, lesions, ulcers, itching   Objective: Vitals:   06/11/18 0501 06/11/18 1301 06/11/18 2204 06/12/18 0711  BP: 97/73 92/69 134/80 140/80  Pulse: 69 79 82 67  Resp: 16 18 18 16   Temp: 97.7 F (36.5 C) 97.7 F (36.5 C) 97.9 F (36.6 C) 98.5 F (36.9 C)  TempSrc: Oral Oral Oral Oral  SpO2: 92% 93% 97% 93%  Weight: 64.1 kg     Height:        Intake/Output Summary (Last 24 hours) at 06/12/2018 1039 Last data filed at 06/12/2018 0742 Gross per 24 hour  Intake 264.76 ml  Output 1300 ml  Net -1035.24 ml   Filed Weights   06/09/18 0500 06/10/18 0456 06/11/18 0501  Weight: 64.2 kg 67.6 kg 64.1 kg    Examination:  Constitutional: NAD, calm, comfortable; overall appears weak.  Eyes: PERRL, lids and conjunctivae normal ENMT: Mucous membranes are moist. Posterior pharynx clear of any exudate or lesions.Normal dentition.  Neck: normal, supple, no masses, no thyromegaly Respiratory: clear to auscultation bilaterally, no wheezing, no crackles. Normal respiratory effort. No accessory muscle use.  Cardiovascular: Regular rate and rhythm, no murmurs / rubs / gallops. No extremity edema. 2+ pedal pulses. No carotid bruits.  Abdomen: no tenderness, no masses palpated. No hepatosplenomegaly. Bowel sounds positive.  Musculoskeletal: no clubbing / cyanosis. No joint deformity upper and lower extremities. Good ROM, no contractures. Normal muscle tone.  Skin: no rashes, lesions, ulcers. No induration Neurologic: CN 2-12 grossly intact. Sensation intact, DTR normal. Strength 4/5 in all 4.  Psychiatric: Normal judgment  and insight. Alert and oriented x 3. Normal mood.     Data Reviewed:   CBC: Recent Labs  Lab 06/06/18 0435 06/07/18 0406 06/07/18 1601 06/08/18 0510 06/09/18 0413 06/10/18 1110 06/12/18 0531  WBC 0.7* 2.8* 6.4 11.9* 15.5* 15.1* 13.2*  NEUTROABS 0.1* 1.5*  --   --   --   --   --   HGB 7.4* 7.6* 7.7* 7.6* 8.0* 8.0* 8.0*  HCT 23.8* 24.7* 24.7* 24.6* 26.4* 26.1* 26.1*  MCV 88.8 91.1 91.8 88.8 90.7 91.3 92.9  PLT 210 180 177 177 159 201 062   Basic Metabolic Panel: Recent Labs  Lab 06/07/18 0406 06/07/18 1601 06/08/18 0510 06/09/18 0413 06/10/18 1110 06/12/18 0531  NA 135 133* 133* 134* 130* 136  K 4.2 3.9 3.9 4.2 3.9 4.2  CL 102 98 97* 96* 98 100  CO2 24 26 27 29 26 27   GLUCOSE 83 135* 99 88 83 85  BUN 24* 25* 25* 21 25* 23  CREATININE 0.48 0.69 0.53 0.48 0.53 0.54  CALCIUM 8.4* 8.4* 8.3* 8.9 8.2* 8.3*  MG 2.4  --  2.2 2.3 2.3 2.4   GFR: Estimated Creatinine Clearance: 51.3 mL/min (by C-G formula based on SCr of 0.54 mg/dL). Liver Function Tests: Recent Labs  Lab 06/06/18 0435 06/07/18 0406 06/08/18 0510 06/09/18 0413  AST 12* 12* 14* 14*  ALT 22 20 21 20   ALKPHOS 128* 150* 164* 165*  BILITOT 0.9 0.2* 0.4 0.6  PROT 5.0* 5.2* 5.0* 5.0*  ALBUMIN 2.4* 2.4* 2.4* 2.4*   No results for input(s): LIPASE, AMYLASE in the last 168 hours. No results for input(s): AMMONIA in the last 168 hours. Coagulation  Profile: No results for input(s): INR, PROTIME in the last 168 hours. Cardiac Enzymes: No results for input(s): CKTOTAL, CKMB, CKMBINDEX, TROPONINI in the last 168 hours. BNP (last 3 results) No results for input(s): PROBNP in the last 8760 hours. HbA1C: No results for input(s): HGBA1C in the last 72 hours. CBG: No results for input(s): GLUCAP in the last 168 hours. Lipid Profile: No results for input(s): CHOL, HDL, LDLCALC, TRIG, CHOLHDL, LDLDIRECT in the last 72 hours. Thyroid Function Tests: No results for input(s): TSH, T4TOTAL, FREET4, T3FREE, THYROIDAB  in the last 72 hours. Anemia Panel: No results for input(s): VITAMINB12, FOLATE, FERRITIN, TIBC, IRON, RETICCTPCT in the last 72 hours. Sepsis Labs: Recent Labs  Lab 06/07/18 1601  LATICACIDVEN 1.9    No results found for this or any previous visit (from the past 240 hour(s)).       Radiology Studies: No results found.      Scheduled Meds: . apixaban  5 mg Oral BID  . dexamethasone  4 mg Oral Daily  . feeding supplement (ENSURE ENLIVE)  237 mL Oral BID BM  . fentaNYL  12.5 mcg Transdermal Q72H   And  . fentaNYL  25 mcg Transdermal Q72H  . levothyroxine  75 mcg Oral QAC breakfast  . lidocaine  2 patch Transdermal Q24H  . nystatin  5 mL Oral QID  . pantoprazole  40 mg Oral BID WC  . polyethylene glycol  17 g Oral BID  . pregabalin  25 mg Oral BID  . senna  1 tablet Oral BID  . sodium chloride flush  3 mL Intravenous Q12H  . sucralfate  1 g Oral TID WC & HS  . traZODone  50 mg Oral QHS   Continuous Infusions: . sodium chloride 10 mL/hr at 06/11/18 1700  . famotidine (PEPCID) IV 20 mg (06/12/18 0946)     LOS: 13 days   Time spent= 22 mins    Foday Cone Arsenio Loader, MD [facility] Pager [phone]   If 7PM-7AM, please contact night-coverage [email or web address] Password [email or web address] 06/12/2018, 10:39 AM

## 2018-06-13 MED ORDER — FAMOTIDINE 20 MG PO TABS
20.0000 mg | ORAL_TABLET | Freq: Two times a day (BID) | ORAL | Status: DC
  Administered 2018-06-13 – 2018-06-21 (×16): 20 mg via ORAL
  Filled 2018-06-13 (×16): qty 1

## 2018-06-13 NOTE — Progress Notes (Signed)
PROGRESS NOTE    [name] [name]  CBJ:[id number] DOB: [birth year] DOA: 05/30/2018 PCP: Marin Olp, MD   Brief Narrative:  77 year old with history of small cell lung cancer with metastasis to liver and spine with cord compression on systemic chemotherapy and palliative radiotherapy, hypothyroidism, diverticulosis came to [facility] long ED from oncology office with complaints of abdominal pain and bright red blood per rectum.  She was initially noted to be dehydrated and hypotensive.  Echocardiogram showed left ventricular thrombus and cardiology recommended starting her on anticoagulation.  Palliative care consulted and patient has opted to go under hospice care at long-term care facility.  Currently awaiting authorization from her insurance/from cancer policy.    Assessment & Plan:   Principal Problem:   Left ventricular thrombus without MI Active Problems:   Hypothyroidism   Spinal cord compression due to malignant neoplasm metastatic to spine (HCC)   Small cell carcinoma of lower lobe of left lung (HCC)   BRBPR (bright red blood per rectum)   Complete lesion at T6 level of thoracic spinal cord (HCC)   Urinary and fecal incontinence   Rectal bleeding   ESBL (extended spectrum beta-lactamase) producing bacteria infection   Emphysematous cystitis  Left ventricular thrombus -Appreciate cardiology input.  CT shows visualization of 1.9 X1.2X 1.9 cm apical left ventricular clot. - Patient wishes to continue Eliquis while on hospice.  Will need repeat echocardiogram in 3 weeks to reevaluate this.  Bright red blood per rectum; subsided for now.  - GI consulted and thought this was secondary to rectal mucosal bleeding or internal hemorrhoid bleeding.  GI recommends close follow-up otherwise no acute intervention at this time.  Neutropenia has improved  Bilateral ankle swelling; stable.  -Lower extremity Dopplers negative for DVT  Emphysematous cystitis with mild right-sided  hydro-nephrosis and hydroureter; status post treatment - Seen on CT of the abdomen pelvis 12/12.  Due to her improved no compromise states she was started on meropenem.  She has history of ESBL UTI. Colbert Ewing stopped 12/24  Extensive small cell lung cancer with metastases - After an extensive discussion with the patient's son and the patient, patient at this point is leaning towards nochemotherapy.  Appreciate palliative care input.  Plan is to transition patient to long-term care with hospice.  Awaiting insurance authorization under her hospice policy.  Severe indigestion -Protonix twice daily, GI cocktail as needed, Carafate 3 times daily -Continue PPI  Chronic cancer pain -Pain control  Hypothyroidism -Continue Synthroid  In the setting of LV thrombus and GI bleed, patient understands she is at risk of bleeding and would like to proceed with anticoagulation.  Oncology on 12/14-patient has terminal condition with median survival from current treatment ranging from 9-12 months.  DVT prophylaxis: Eliquis  Code Status: DNR Family Communication: Son Disposition Plan: Awaiting insurance approval under hospice policy.  Awaiting son and social worker to find placement for her.  Consultants:   Oncology  Cardiology  Palliative care  GI  Procedures:   Lower extremity Dopplers-negative for DVT  Echocardiogram-left LV thrombus  Subjective: Slightly confused but overall cooperative and denies any complaints.  Review of Systems Otherwise negative except as per HPI, including: General = no fevers, chills, dizziness, malaise, fatigue HEENT/EYES = negative for pain, redness, loss of vision, double vision, blurred vision, loss of hearing, sore throat, hoarseness, dysphagia Cardiovascular= negative for chest pain, palpitation, murmurs, lower extremity swelling Respiratory/lungs= negative for shortness of breath, cough, hemoptysis, wheezing, mucus production Gastrointestinal= negative  for nausea, vomiting,, abdominal pain,  melena, hematemesis Genitourinary= negative for Dysuria, Hematuria, Change in Urinary Frequency MSK = Negative for arthralgia, myalgias, Back Pain, Joint swelling  Neurology= Negative for headache, seizures, numbness, tingling  Psychiatry= Negative for anxiety, depression, suicidal and homocidal ideation Allergy/Immunology= Medication/Food allergy as listed  Skin= Negative for Rash, lesions, ulcers, itching    Objective: Vitals:   06/13/18 0522 06/13/18 0606 06/13/18 1304 06/13/18 1307  BP: 127/74  (!) 87/67 97/61  Pulse: 72  81 80  Resp: 12     Temp: 97.9 F (36.6 C)   98.3 F (36.8 C)  TempSrc: Oral   Oral  SpO2: 94%  95% 94%  Weight:  65.5 kg    Height:        Intake/Output Summary (Last 24 hours) at 06/13/2018 1321 Last data filed at 06/13/2018 1000 Gross per 24 hour  Intake 790 ml  Output 2400 ml  Net -1610 ml   Filed Weights   06/10/18 0456 06/11/18 0501 06/13/18 0606  Weight: 67.6 kg 64.1 kg 65.5 kg    Examination:  Constitutional: NAD, calm, comfortable, generally weak and frail-appearing Eyes: PERRL, lids and conjunctivae normal ENMT: Mucous membranes are moist. Posterior pharynx clear of any exudate or lesions.Normal dentition.  Neck: normal, supple, no masses, no thyromegaly Respiratory: clear to auscultation bilaterally, no wheezing, no crackles. Normal respiratory effort. No accessory muscle use.  Cardiovascular: Regular rate and rhythm, no murmurs / rubs / gallops. No extremity edema. 2+ pedal pulses. No carotid bruits.  Abdomen: no tenderness, no masses palpated. No hepatosplenomegaly. Bowel sounds positive.  Musculoskeletal: no clubbing / cyanosis. No joint deformity upper and lower extremities. Good ROM, no contractures. Normal muscle tone.  Skin: no rashes, lesions, ulcers. No induration Neurologic: CN 2-12 grossly intact. Sensation intact, DTR normal. Strength 4/5 in all 4.  Psychiatric: Normal judgment and  insight. Alert and oriented x 3. Normal mood.    Data Reviewed:   CBC: Recent Labs  Lab 06/07/18 0406 06/07/18 1601 06/08/18 0510 06/09/18 0413 06/10/18 1110 06/12/18 0531  WBC 2.8* 6.4 11.9* 15.5* 15.1* 13.2*  NEUTROABS 1.5*  --   --   --   --   --   HGB 7.6* 7.7* 7.6* 8.0* 8.0* 8.0*  HCT 24.7* 24.7* 24.6* 26.4* 26.1* 26.1*  MCV 91.1 91.8 88.8 90.7 91.3 92.9  PLT 180 177 177 159 201 785   Basic Metabolic Panel: Recent Labs  Lab 06/07/18 0406 06/07/18 1601 06/08/18 0510 06/09/18 0413 06/10/18 1110 06/12/18 0531  NA 135 133* 133* 134* 130* 136  K 4.2 3.9 3.9 4.2 3.9 4.2  CL 102 98 97* 96* 98 100  CO2 24 26 27 29 26 27   GLUCOSE 83 135* 99 88 83 85  BUN 24* 25* 25* 21 25* 23  CREATININE 0.48 0.69 0.53 0.48 0.53 0.54  CALCIUM 8.4* 8.4* 8.3* 8.9 8.2* 8.3*  MG 2.4  --  2.2 2.3 2.3 2.4   GFR: Estimated Creatinine Clearance: 51.3 mL/min (by C-G formula based on SCr of 0.54 mg/dL). Liver Function Tests: Recent Labs  Lab 06/07/18 0406 06/08/18 0510 06/09/18 0413  AST 12* 14* 14*  ALT 20 21 20   ALKPHOS 150* 164* 165*  BILITOT 0.2* 0.4 0.6  PROT 5.2* 5.0* 5.0*  ALBUMIN 2.4* 2.4* 2.4*   No results for input(s): LIPASE, AMYLASE in the last 168 hours. No results for input(s): AMMONIA in the last 168 hours. Coagulation Profile: No results for input(s): INR, PROTIME in the last 168 hours. Cardiac Enzymes: No results for  input(s): CKTOTAL, CKMB, CKMBINDEX, TROPONINI in the last 168 hours. BNP (last 3 results) No results for input(s): PROBNP in the last 8760 hours. HbA1C: No results for input(s): HGBA1C in the last 72 hours. CBG: No results for input(s): GLUCAP in the last 168 hours. Lipid Profile: No results for input(s): CHOL, HDL, LDLCALC, TRIG, CHOLHDL, LDLDIRECT in the last 72 hours. Thyroid Function Tests: No results for input(s): TSH, T4TOTAL, FREET4, T3FREE, THYROIDAB in the last 72 hours. Anemia Panel: No results for input(s): VITAMINB12, FOLATE,  FERRITIN, TIBC, IRON, RETICCTPCT in the last 72 hours. Sepsis Labs: Recent Labs  Lab 06/07/18 1601  LATICACIDVEN 1.9    No results found for this or any previous visit (from the past 240 hour(s)).       Radiology Studies: No results found.      Scheduled Meds: . apixaban  5 mg Oral BID  . dexamethasone  4 mg Oral Daily  . famotidine  20 mg Oral BID  . feeding supplement (ENSURE ENLIVE)  237 mL Oral BID BM  . fentaNYL  12.5 mcg Transdermal Q72H   And  . fentaNYL  25 mcg Transdermal Q72H  . levothyroxine  75 mcg Oral QAC breakfast  . lidocaine  2 patch Transdermal Q24H  . nystatin  5 mL Oral QID  . pantoprazole  40 mg Oral BID WC  . polyethylene glycol  17 g Oral BID  . pregabalin  25 mg Oral BID  . senna  1 tablet Oral BID  . sodium chloride flush  3 mL Intravenous Q12H  . sucralfate  1 g Oral TID WC & HS  . traZODone  50 mg Oral QHS   Continuous Infusions: . sodium chloride 10 mL/hr at 06/11/18 1700     LOS: 14 days   Time spent= 20 mins     Arsenio Loader, MD [facility] Pager [phone]   If 7PM-7AM, please contact night-coverage [email or web address] Password [email or web address] 06/13/2018, 1:21 PM

## 2018-06-13 NOTE — Progress Notes (Signed)
Physical Therapy Treatment Patient Details Name: [name] [name] MRN: [id number] DOB: [birth year] Today's Date: 06/13/2018    History of Present Illness  77 y.o. female with a hx significant for small cell lung CA w/ metastases to the liver and spine with cord compression on chemo and palliative radiation, HLD, tobacco abuse, hypothyroidism and diverticulosis, who presented from oncologist office due to abdominal pain, increased lethargy and BRBPR.   Patient with an incidental finding of a 1.9 x 1.2 x 1.9 cm apical left.    PT Comments    Pt in bed incont urine and bowel. Assisted OOB to [facility] required + 2 assist. General transfer comment: 75% VC's and increased assist to complete 1/4 pivot from elevated bed to [facility] then to recliner.  Very weak B LE's and limited endurance.  General Gait Details: unable to attempt due to low transfer ability Pt will need ST Rehab at SNF to regain prior level of mobility.   Follow Up Recommendations  Supervision/Assistance - 24 hour;SNF     Equipment Recommendations       Recommendations for Other Services       Precautions / Restrictions Precautions Precautions: Fall Precaution Comments: pubic ramus fracture in October, mets to spine  Restrictions Weight Bearing Restrictions: No Other Position/Activity Restrictions: WBAT    Mobility  Bed Mobility Overal bed mobility: Needs Assistance Bed Mobility: Supine to Sit     Supine to sit: Mod assist     General bed mobility comments: Mod A, increased time and repeat VC's to stay on task  Transfers Overall transfer level: Needs assistance Equipment used: Rolling walker (2 wheeled) Transfers: Sit to/from [facility] Sit to Stand: Mod assist;Max assist Stand pivot transfers: Max assist;Total assist       General transfer comment: 75% VC's and increased assist to complete 1/4 pivot from elevated bed to [facility] then to recliner.  Very weak B LE's and limited endurance.     Ambulation/Gait             General Gait Details: unable to attempt due to low transfer ability   Stairs             Wheelchair Mobility    Modified Rankin (Stroke Patients Only)       Balance                                            Cognition Arousal/Alertness: Awake/alert Behavior During Therapy: WFL for tasks assessed/performed Overall Cognitive Status: Impaired/Different from baseline Area of Impairment: Memory;Following commands;Problem solving;Orientation;Safety/judgement;Awareness                       Following Commands: Follows one step commands inconsistently Safety/Judgement: Decreased awareness of safety;Decreased awareness of deficits     General Comments: required repeat functional cueing and easily distarcted       Exercises      General Comments        Pertinent Vitals/Pain Pain Assessment: Faces Faces Pain Scale: Hurts a little bit Pain Location: generalized, and gastric reflux  Pain Descriptors / Indicators: Discomfort Pain Intervention(s): Monitored during session    Home Living                      Prior Function            PT Goals (current goals can  now be found in the care plan section) Progress towards PT goals: Progressing toward goals    Frequency    Min 2X/week      PT Plan Current plan remains appropriate    Co-evaluation              AM-PAC PT "6 Clicks" Mobility   Outcome Measure  Help needed turning from your back to your side while in a flat bed without using bedrails?: A Lot Help needed moving from lying on your back to sitting on the side of a flat bed without using bedrails?: A Lot Help needed moving to and from a bed to a chair (including a wheelchair)?: Total Help needed standing up from a chair using your arms (e.g., wheelchair or bedside chair)?: Total Help needed to walk in hospital room?: Total Help needed climbing 3-5 steps with a railing? :  Total 6 Click Score: 8    End of Session Equipment Utilized During Treatment: Gait belt Activity Tolerance: Patient limited by fatigue Patient left: in chair;with chair alarm set;with call bell/phone within reach Nurse Communication: Mobility status PT Visit Diagnosis: Muscle weakness (generalized) (M62.81);Other abnormalities of gait and mobility (R26.89)     Time: 1100-1130 PT Time Calculation (min) (ACUTE ONLY): 30 min  Charges:  $Therapeutic Activity: 8-22 mins                     Rica Koyanagi  PTA Acute  Rehabilitation Services Pager      [phone] Office      [phone]

## 2018-06-13 NOTE — Progress Notes (Signed)
LCSW met with patient and family.   Patietn does not want to return to [facility] and gave additional facilities to fax out clinicals.   LCSW made referrals to additional facilities. Possible bed opening tomorrow.  Updated son, Abe People on LTC and SNF bed availability at [facility]. No LTC beds available. Patient can go on waiting list. Possible SNF bed tomorrow.   [facility]-no LTC beds, SNF beds only.  LCSW will continue to follow.   LCSW will follow up in the AM.   Servando Snare, [employer] [facility]

## 2018-06-14 ENCOUNTER — Ambulatory Visit (HOSPITAL_COMMUNITY): Payer: Medicare Other

## 2018-06-14 LAB — CBC
HEMATOCRIT: 26.8 % — AB (ref 36.0–46.0)
Hemoglobin: 8.2 g/dL — ABNORMAL LOW (ref 12.0–15.0)
MCH: 28.8 pg (ref 26.0–34.0)
MCHC: 30.6 g/dL (ref 30.0–36.0)
MCV: 94 fL (ref 80.0–100.0)
Platelets: 362 10*3/uL (ref 150–400)
RBC: 2.85 MIL/uL — ABNORMAL LOW (ref 3.87–5.11)
RDW: 19.7 % — AB (ref 11.5–15.5)
WBC: 16.7 10*3/uL — AB (ref 4.0–10.5)
nRBC: 2.1 % — ABNORMAL HIGH (ref 0.0–0.2)

## 2018-06-14 LAB — BASIC METABOLIC PANEL
Anion gap: 8 (ref 5–15)
BUN: 21 mg/dL (ref 8–23)
CO2: 27 mmol/L (ref 22–32)
Calcium: 8.5 mg/dL — ABNORMAL LOW (ref 8.9–10.3)
Chloride: 100 mmol/L (ref 98–111)
Creatinine, Ser: 0.63 mg/dL (ref 0.44–1.00)
GFR calc Af Amer: >60 mL/min (ref >60)
GFR calc non Af Amer: >60 mL/min (ref >60)
Glucose, Bld: 83 mg/dL (ref 70–99)
Potassium: 4.1 mmol/L (ref 3.5–5.1)
Sodium: 135 mmol/L (ref 135–145)

## 2018-06-14 LAB — MAGNESIUM: Magnesium: 2.3 mg/dL (ref 1.7–2.4)

## 2018-06-14 MED ORDER — TRAZODONE HCL 50 MG PO TABS: 50.0000 mg | ORAL_TABLET | Freq: Every day | ORAL | 0 refills | Status: AC

## 2018-06-14 MED ORDER — APIXABAN 5 MG PO TABS: 5.0000 mg | ORAL_TABLET | Freq: Two times a day (BID) | ORAL | 0 refills | Status: AC

## 2018-06-14 MED ORDER — PREGABALIN 25 MG PO CAPS: 25.0000 mg | ORAL_CAPSULE | Freq: Two times a day (BID) | ORAL | 0 refills | Status: AC

## 2018-06-14 MED ORDER — MORPHINE SULFATE 20 MG/5ML PO SOLN: 5.0000 mg | ORAL | 0 refills | Status: AC | PRN

## 2018-06-14 MED ORDER — SUCRALFATE 1 GM/10ML PO SUSP: 1.0000 g | Freq: Three times a day (TID) | ORAL | 0 refills | Status: AC

## 2018-06-14 MED ORDER — FAMOTIDINE 20 MG PO TABS: 20.0000 mg | ORAL_TABLET | Freq: Two times a day (BID) | ORAL | 0 refills | Status: AC

## 2018-06-14 MED ORDER — ALPRAZOLAM 0.5 MG PO TABS: 0.5000 mg | ORAL_TABLET | Freq: Two times a day (BID) | ORAL | 0 refills | Status: AC | PRN

## 2018-06-14 NOTE — Progress Notes (Signed)
LCSW following for SNF placement.  Patient does not have a bed at this time. LCSW awaiting bed offers. Will update family.  LCSW will continue to follow for facility placement.   Carolin Coy [facility] [place]

## 2018-06-14 NOTE — Progress Notes (Signed)
PROGRESS NOTE    [name] [name]  UYQ:[id number] DOB: [birth year] DOA: 05/30/2018 PCP: Marin Olp, MD   Brief Narrative:  77 year old with history of small cell lung cancer with metastasis to liver and spine with cord compression on systemic chemotherapy and palliative radiotherapy, hypothyroidism, diverticulosis came to [facility] long ED from oncology office with complaints of abdominal pain and bright red blood per rectum.  She was initially noted to be dehydrated and hypotensive.  Echocardiogram showed left ventricular thrombus and cardiology recommended starting her on anticoagulation.  Palliative care consulted and patient has opted to go under hospice care at long-term care facility.  Currently awaiting placement.   Assessment & Plan:   Principal Problem:   Left ventricular thrombus without MI Active Problems:   Hypothyroidism   Spinal cord compression due to malignant neoplasm metastatic to spine (HCC)   Small cell carcinoma of lower lobe of left lung (HCC)   BRBPR (bright red blood per rectum)   Complete lesion at T6 level of thoracic spinal cord (HCC)   Urinary and fecal incontinence   Rectal bleeding   ESBL (extended spectrum beta-lactamase) producing bacteria infection   Emphysematous cystitis  Left ventricular thrombus -Appreciate cardiology input.  CT shows visualization of 1.9 X1.2X 1.9 cm apical left ventricular clot. - Patient wants to continue Eliquis while on hospice..  Will need repeat echocardiogram in 3 weeks to reevaluate this.  Bright red blood per rectum; subsided for now.  - GI consulted and thought this was secondary to rectal mucosal bleeding or internal hemorrhoid bleeding.  GI recommends close follow-up otherwise no acute intervention at this time.  Neutropenia has improved  Bilateral ankle swelling; stable.  -Lower extremity Dopplers negative for DVT  Emphysematous cystitis with mild right-sided hydro-nephrosis and hydroureter; status post  treatment - Seen on CT of the abdomen pelvis 12/12.  Due to her improved no compromise states she was started on meropenem.  She has history of ESBL UTI. Colbert Ewing stopped 12/24  Extensive small cell lung cancer with metastases - After an extensive discussion with the patient's son and the patient, patient at this point is leaning towards nochemotherapy.  Appreciate palliative care input.  Plan is to transition patient to long-term care with hospice.  Awaiting insurance authorization under her hospice policy.  Severe indigestion -Protonix twice daily, GI cocktail as needed, Carafate 3 times daily -Continue PPI  Chronic cancer pain -Pain control  Hypothyroidism -Continue Synthroid  In the setting of LV thrombus and GI bleed, patient understands the risks and benefit of being on anticoagulation in the setting of GI bleeding.  We will proceed with anticoagulation for now  Oncology on 12/14-patient has terminal condition with median survival from current treatment ranging from 9-12 months.  DVT prophylaxis: Eliquis  Code Status: DNR Family Communication: Son Disposition Plan: Currently we are awaiting placement.  Consultants:   Oncology  Cardiology  Palliative care  GI  Procedures:   Lower extremity Dopplers-negative for DVT  Echocardiogram-left LV thrombus  Subjective: Patient is in a complaints this morning.  Overall she is very cooperative.  Tolerating oral diet as best as she can.  Review of Systems Otherwise negative except as per HPI, including: General = no fevers, chills, dizziness, malaise, fatigue HEENT/EYES = negative for pain, redness, loss of vision, double vision, blurred vision, loss of hearing, sore throat, hoarseness, dysphagia Cardiovascular= negative for chest pain, palpitation, murmurs, lower extremity swelling Respiratory/lungs= negative for shortness of breath, cough, hemoptysis, wheezing, mucus production Gastrointestinal= negative for nausea,  vomiting,, abdominal pain, melena, hematemesis Genitourinary= negative for Dysuria, Hematuria, Change in Urinary Frequency MSK = Negative for arthralgia, myalgias, Back Pain, Joint swelling  Neurology= Negative for headache, seizures, numbness, tingling  Psychiatry= Negative for anxiety, depression, suicidal and homocidal ideation Allergy/Immunology= Medication/Food allergy as listed  Skin= Negative for Rash, lesions, ulcers, itching     Objective: Vitals:   06/13/18 1304 06/13/18 1307 06/13/18 2157 06/14/18 0637  BP: (!) 87/67 97/61 (!) 129/92 112/72  Pulse: 81 80 68 75  Resp:   18   Temp:  98.3 F (36.8 C) 98.8 F (37.1 C) 98 F (36.7 C)  TempSrc:  Oral Oral Oral  SpO2: 95% 94%  98%  Weight:      Height:        Intake/Output Summary (Last 24 hours) at 06/14/2018 1241 Last data filed at 06/14/2018 0960 Gross per 24 hour  Intake 1200 ml  Output 400 ml  Net 800 ml   Filed Weights   06/10/18 0456 06/11/18 0501 06/13/18 0606  Weight: 67.6 kg 64.1 kg 65.5 kg    Examination:  Constitutional: NAD, calm, comfortable; generally weak appearing. Temporal wasting.  Eyes: PERRL, lids and conjunctivae normal ENMT: Mucous membranes are DRY Posterior pharynx clear of any exudate or lesions.Normal dentition.  Neck: normal, supple, no masses, no thyromegaly Respiratory: clear to auscultation bilaterally, no wheezing, no crackles. Normal respiratory effort. No accessory muscle use.  Cardiovascular: Regular rate and rhythm, no murmurs / rubs / gallops. No extremity edema. 2+ pedal pulses. No carotid bruits.  Abdomen: no tenderness, no masses palpated. No hepatosplenomegaly. Bowel sounds positive.  Musculoskeletal: no clubbing / cyanosis. No joint deformity upper and lower extremities. Good ROM, no contractures. Normal muscle tone.  Skin: no rashes, lesions, ulcers. No induration Neurologic: CN 2-12 grossly intact. Sensation intact, DTR normal. Strength 4/5 in all 4.  Psychiatric:  AAOx3   Data Reviewed:   CBC: Recent Labs  Lab 06/08/18 0510 06/09/18 0413 06/10/18 1110 06/12/18 0531 06/14/18 0929  WBC 11.9* 15.5* 15.1* 13.2* 16.7*  HGB 7.6* 8.0* 8.0* 8.0* 8.2*  HCT 24.6* 26.4* 26.1* 26.1* 26.8*  MCV 88.8 90.7 91.3 92.9 94.0  PLT 177 159 201 248 454   Basic Metabolic Panel: Recent Labs  Lab 06/08/18 0510 06/09/18 0413 06/10/18 1110 06/12/18 0531 06/14/18 0929  NA 133* 134* 130* 136 135  K 3.9 4.2 3.9 4.2 4.1  CL 97* 96* 98 100 100  CO2 27 29 26 27 27   GLUCOSE 99 88 83 85 83  BUN 25* 21 25* 23 21  CREATININE 0.53 0.48 0.53 0.54 0.63  CALCIUM 8.3* 8.9 8.2* 8.3* 8.5*  MG 2.2 2.3 2.3 2.4 2.3   GFR: Estimated Creatinine Clearance: 51.3 mL/min (by C-G formula based on SCr of 0.63 mg/dL). Liver Function Tests: Recent Labs  Lab 06/08/18 0510 06/09/18 0413  AST 14* 14*  ALT 21 20  ALKPHOS 164* 165*  BILITOT 0.4 0.6  PROT 5.0* 5.0*  ALBUMIN 2.4* 2.4*   No results for input(s): LIPASE, AMYLASE in the last 168 hours. No results for input(s): AMMONIA in the last 168 hours. Coagulation Profile: No results for input(s): INR, PROTIME in the last 168 hours. Cardiac Enzymes: No results for input(s): CKTOTAL, CKMB, CKMBINDEX, TROPONINI in the last 168 hours. BNP (last 3 results) No results for input(s): PROBNP in the last 8760 hours. HbA1C: No results for input(s): HGBA1C in the last 72 hours. CBG: No results for input(s): GLUCAP in the last 168 hours. Lipid Profile:  No results for input(s): CHOL, HDL, LDLCALC, TRIG, CHOLHDL, LDLDIRECT in the last 72 hours. Thyroid Function Tests: No results for input(s): TSH, T4TOTAL, FREET4, T3FREE, THYROIDAB in the last 72 hours. Anemia Panel: No results for input(s): VITAMINB12, FOLATE, FERRITIN, TIBC, IRON, RETICCTPCT in the last 72 hours. Sepsis Labs: Recent Labs  Lab 06/07/18 1601  LATICACIDVEN 1.9    No results found for this or any previous visit (from the past 240 hour(s)).       Radiology  Studies: No results found.      Scheduled Meds: . apixaban  5 mg Oral BID  . dexamethasone  4 mg Oral Daily  . famotidine  20 mg Oral BID  . feeding supplement (ENSURE ENLIVE)  237 mL Oral BID BM  . fentaNYL  12.5 mcg Transdermal Q72H   And  . fentaNYL  25 mcg Transdermal Q72H  . levothyroxine  75 mcg Oral QAC breakfast  . lidocaine  2 patch Transdermal Q24H  . nystatin  5 mL Oral QID  . pantoprazole  40 mg Oral BID WC  . polyethylene glycol  17 g Oral BID  . pregabalin  25 mg Oral BID  . senna  1 tablet Oral BID  . sodium chloride flush  3 mL Intravenous Q12H  . sucralfate  1 g Oral TID WC & HS  . traZODone  50 mg Oral QHS   Continuous Infusions: . sodium chloride 10 mL/hr at 06/11/18 1700     LOS: 15 days   Time spent= 22mins    Bexleigh Theriault Arsenio Loader, MD [facility] Pager [phone]   If 7PM-7AM, please contact night-coverage [email or web address] Password [facility] 06/14/2018, 12:41 PM

## 2018-06-15 MED ORDER — CALCIUM CARBONATE ANTACID 500 MG PO CHEW: 1.0000 | CHEWABLE_TABLET | Freq: Four times a day (QID) | ORAL | Status: DC | PRN

## 2018-06-15 NOTE — Progress Notes (Signed)
PROGRESS NOTE  [name] [name] EXH:[id number] DOB: 1940/11/03 DOA: 05/30/2018 PCP: Marin Olp, MD  Brief Narrative: 77 year old woman PMH small cell lung cancer with metastatic disease to the liver and spine with cord compression, on systemic chemotherapy and palliative radiotherapy, presented from the oncology office with abdominal pain and bright red blood per rectum.  Echocardiogram showed left ventricular thrombus and cardiology recommended anticoagulation.  Palliative medicine consulted and patient is opted for hospice care at long-term facility.  Assessment/Plan Small cell lung cancer with metastatic disease to thoracic spine causing cord compression, cauda equina syndrome, partial paraplegia, fecal incontinence and urinary retention --Patient has decided against any further chemotherapy.  --Continue Foley catheter, PICC line --At this point patient plans to transition to long-term care with hospice.  Awaiting bed.  Left ventricular thrombus seen by CT.  After much deliberation and consultation with gastroenterology, cardiology and oncology, patient has elected to take anticoagulation. --Patient desires to continue to take Eliquis at hospice. --Repeat echocardiogram in 3 weeks to reevaluate  GI bleed, bright red blood per rectum, complicated by chronic anemia in the setting of malignancy.  GI consulted thought secondary to rectal mucosal bleeding or internal hemorrhoidal bleeding. --GI recommended no acute intervention.  Severe GERD --Continue Protonix, Carafate, Pepcid, Tums as needed  Chronic pain secondary to malignancy --Continue pain control  Hypothyroidism --Continue levothyroxine  Bilateral lower extremity edema, Dopplers negative for DVT  Emphysematous cystitis with mild right-sided hydronephrosis and hydroureter seen on CT 12/12, treated with meropenem.   Awaiting bed  DVT prophylaxis: apixaban Code Status: DNR Family Communication: husband at  bedside Disposition Plan: LTC with hospice    Murray Hodgkins, MD  [facility] Direct contact: [phone] --Via amion app OR  --[email or web address]; password [email or web address]  7PM-7AM contact night coverage as above 06/15/2018, 6:53 PM  LOS: 16 days   Consultants:  Gastroenterology  Cardiology  Palliative medicine  Oncology  Procedures:  Echo Study Conclusions  - Left ventricle: The cavity size was normal. Systolic function was   mildly reduced. The estimated ejection fraction was in the range   of 45% to 50%. Hypokinesis of the apical myocardium. Features are   consistent with a pseudonormal left ventricular filling pattern,   with concomitant abnormal relaxation and increased filling   pressure (grade 2 diastolic dysfunction). There was a thrombus. - Regional wall motion abnormality: Hypokinesis of the mid   anteroseptal, mid inferoseptal, apical inferior, apical septal,   and apical myocardium. - Mitral valve: Calcified annulus. Mildly thickened leaflets . Late   systolic prolapse, involving the anterior leaflet. There was   moderate late systolic regurgitation due to prolapse. - Left atrium: The atrium was mildly dilated.  Impressions:  - Echo contrast (definity) used to evaluate for LV thrombus.   The only image suggestive of thrombus is image 54 (screenshot   saved with my annotation with arrow). There is a dense structure   that comes in and out of view at approximately 10-11 o&'clock on   that image. This could align with the mass seen on CT imaging, as   the shape/size are similar. Favor LV thrombus.  Antimicrobials:    Interval history/Subjective: Complains of severe GERD with movement otherwise feeling okay.  Objective: Vitals:  Vitals:   06/15/18 0615 06/15/18 1420  BP: 140/79 (!) 85/67  Pulse: 70 92  Resp: 19   Temp: 97.6 F (36.4 C)   SpO2: 97% (!) 89%    Exam:  Constitutional:  . Appears calm and comfortable Respiratory:  .  CTA  bilaterally, no w/r/r.  . Respiratory effort normal. Cardiovascular:  . RRR, no m/r/g . No LE extremity edema   Psychiatric:  . Mental status o Mood, affect appropriate  I have personally reviewed the following:   Data: . No labs today . Hemoglobin stable over the last several days, no leukopenia noted, platelets stable.  BMP unremarkable over the last several days. .   Scheduled Meds: . apixaban  5 mg Oral BID  . dexamethasone  4 mg Oral Daily  . famotidine  20 mg Oral BID  . feeding supplement (ENSURE ENLIVE)  237 mL Oral BID BM  . fentaNYL  12.5 mcg Transdermal Q72H   And  . fentaNYL  25 mcg Transdermal Q72H  . levothyroxine  75 mcg Oral QAC breakfast  . lidocaine  2 patch Transdermal Q24H  . nystatin  5 mL Oral QID  . pantoprazole  40 mg Oral BID WC  . polyethylene glycol  17 g Oral BID  . pregabalin  25 mg Oral BID  . senna  1 tablet Oral BID  . sodium chloride flush  3 mL Intravenous Q12H  . sucralfate  1 g Oral TID WC & HS  . traZODone  50 mg Oral QHS   Continuous Infusions: . sodium chloride 250 mL (06/15/18 1550)    Principal Problem:   Small cell carcinoma of lower lobe of left lung (HCC) Active Problems:   Hypothyroidism   Spinal cord compression due to malignant neoplasm metastatic to spine (HCC)   Left ventricular thrombus without MI   BRBPR (bright red blood per rectum)   Complete lesion at T6 level of thoracic spinal cord (HCC)   Urinary and fecal incontinence   Rectal bleeding   ESBL (extended spectrum beta-lactamase) producing bacteria infection   Emphysematous cystitis   LOS: 16 days

## 2018-06-16 DIAGNOSIS — K219 Gastro-esophageal reflux disease without esophagitis: Secondary | ICD-10-CM

## 2018-06-16 LAB — BASIC METABOLIC PANEL
Anion gap: 8 (ref 5–15)
BUN: 31 mg/dL — ABNORMAL HIGH (ref 8–23)
CO2: 27 mmol/L (ref 22–32)
CREATININE: 0.78 mg/dL (ref 0.44–1.00)
Calcium: 8.3 mg/dL — ABNORMAL LOW (ref 8.9–10.3)
Chloride: 100 mmol/L (ref 98–111)
GFR calc Af Amer: >60 mL/min (ref >60)
GFR calc non Af Amer: >60 mL/min (ref >60)
Glucose, Bld: 107 mg/dL — ABNORMAL HIGH (ref 70–99)
Potassium: 4.6 mmol/L (ref 3.5–5.1)
Sodium: 135 mmol/L (ref 135–145)

## 2018-06-16 MED ORDER — CALCIUM CARBONATE ANTACID 500 MG PO CHEW
1.0000 | CHEWABLE_TABLET | Freq: Four times a day (QID) | ORAL | Status: DC | PRN
  Administered 2018-06-19: 200 mg via ORAL
  Filled 2018-06-16: qty 1

## 2018-06-16 NOTE — Progress Notes (Signed)
PROGRESS NOTE  [name] [name] QMV:[id number] DOB: 31-Dec-1940 DOA: 05/30/2018 PCP: Marin Olp, MD  Brief Narrative: 77 year old woman PMH small cell lung cancer with metastatic disease to the liver and spine with cord compression, on systemic chemotherapy and palliative radiotherapy, presented from the oncology office with abdominal pain and bright red blood per rectum.  Echocardiogram showed left ventricular thrombus and cardiology recommended anticoagulation.  Palliative medicine consulted and patient is opted for hospice care at long-term facility.  Assessment/Plan Small cell lung cancer with metastatic disease to thoracic spine causing cord compression, cauda equina syndrome, partial paraplegia, fecal incontinence and urinary retention --Patient has decided against further chemotherapy --This morning patient states she does not want to pursue hospice --Keep Foley and PICC line --Plan transition to long-term care; awaiting bed  Left ventricular thrombus seen by CT.  After much deliberation and consultation with gastroenterology, cardiology and oncology, patient has elected to take anticoagulation. --Continue Eliquis --Repeat echocardiogram in 3 weeks to reevaluate  GI bleed, bright red blood per rectum, complicated by chronic anemia in the setting of malignancy.  GI consulted thought secondary to rectal mucosal bleeding or internal hemorrhoidal bleeding. --GI recommended no acute intervention.  Severe GERD --Continue Protonix, Carafate, Pepcid; Tums as needed  Chronic pain secondary to malignancy --Continue pain control  Hypothyroidism --Continue levothyroxine  Bilateral lower extremity edema, Dopplers negative for DVT  Emphysematous cystitis with mild right-sided hydronephrosis and hydroureter seen on CT 12/12, treated with meropenem.   Continue to await long-term care bed.  Does not desire hospice at this point.  Appears to be slightly confused this morning.   Follow-up in a.m.  DVT prophylaxis: apixaban Code Status: DNR Family Communication: husband at bedside Disposition Plan: LTC with hospice    Murray Hodgkins, MD  [facility] Direct contact: [phone] --Via amion app OR  --[email or web address]; password [email or web address]  7PM-7AM contact night coverage as above 06/16/2018, 12:28 PM  LOS: 17 days   Consultants:  Gastroenterology  Cardiology  Palliative medicine  Oncology  Procedures:  Echo Study Conclusions  - Left ventricle: The cavity size was normal. Systolic function was   mildly reduced. The estimated ejection fraction was in the range   of 45% to 50%. Hypokinesis of the apical myocardium. Features are   consistent with a pseudonormal left ventricular filling pattern,   with concomitant abnormal relaxation and increased filling   pressure (grade 2 diastolic dysfunction). There was a thrombus. - Regional wall motion abnormality: Hypokinesis of the mid   anteroseptal, mid inferoseptal, apical inferior, apical septal,   and apical myocardium. - Mitral valve: Calcified annulus. Mildly thickened leaflets . Late   systolic prolapse, involving the anterior leaflet. There was   moderate late systolic regurgitation due to prolapse. - Left atrium: The atrium was mildly dilated.  Impressions:  - Echo contrast (definity) used to evaluate for LV thrombus.   The only image suggestive of thrombus is image 56 (screenshot   saved with my annotation with arrow). There is a dense structure   that comes in and out of view at approximately 10-11 o&'clock on   that image. This could align with the mass seen on CT imaging, as   the shape/size are similar. Favor LV thrombus.  Antimicrobials:    Interval history/Subjective: Eating well, however continues to have severe GERD symptoms.  Objective: Vitals:  Vitals:   06/15/18 2150 06/16/18 0659  BP: 101/68 132/82  Pulse: 83 75  Resp: 16 16  Temp: (!) 97.4 F (36.3 C) 98.7 F  (  37.1 C)  SpO2: 93% 92%    Exam:  Constitutional:   . Appears calm and comfortable Eyes:  . pupils and irises appear normal ENMT:  . Hard of hearing . Tongue appears unremarkable Respiratory:  . CTA bilaterally, no w/r/r.  . Respiratory effort normal.  Cardiovascular:  . RRR, no m/r/g . No significant LE extremity edema   Abdomen:  . Soft, mild generalized tenderness Psychiatric:  . Mental status o Mood, affect appropriate  I have personally reviewed the following:   Data: . BMP unremarkable other than elevated BUN of 31.  Scheduled Meds: . apixaban  5 mg Oral BID  . dexamethasone  4 mg Oral Daily  . famotidine  20 mg Oral BID  . feeding supplement (ENSURE ENLIVE)  237 mL Oral BID BM  . fentaNYL  12.5 mcg Transdermal Q72H   And  . fentaNYL  25 mcg Transdermal Q72H  . levothyroxine  75 mcg Oral QAC breakfast  . lidocaine  2 patch Transdermal Q24H  . nystatin  5 mL Oral QID  . pantoprazole  40 mg Oral BID WC  . polyethylene glycol  17 g Oral BID  . pregabalin  25 mg Oral BID  . senna  1 tablet Oral BID  . sodium chloride flush  3 mL Intravenous Q12H  . sucralfate  1 g Oral TID WC & HS  . traZODone  50 mg Oral QHS   Continuous Infusions: . sodium chloride 250 mL (06/15/18 1550)    Principal Problem:   Small cell carcinoma of lower lobe of left lung (HCC) Active Problems:   Hypothyroidism   Spinal cord compression due to malignant neoplasm metastatic to spine (HCC)   Left ventricular thrombus without MI   BRBPR (bright red blood per rectum)   Complete lesion at T6 level of thoracic spinal cord (HCC)   Urinary and fecal incontinence   Rectal bleeding   ESBL (extended spectrum beta-lactamase) producing bacteria infection   Emphysematous cystitis   LOS: 17 days

## 2018-06-17 ENCOUNTER — Inpatient Hospital Stay: Payer: Medicare Other

## 2018-06-17 ENCOUNTER — Inpatient Hospital Stay: Payer: Medicare Other | Admitting: Internal Medicine

## 2018-06-17 NOTE — Care Management Important Message (Signed)
Important Message  Patient Details  Name: [name] [name] MRN: [id number] Date of Birth: 1941/05/10   Medicare Important Message Given:  Yes    Kerin Salen 06/17/2018, 11:16 AMImportant Message  Patient Details  Name: [name] [name] MRN: [id number] Date of Birth: 12/29/1940   Medicare Important Message Given:  Yes    Kerin Salen 06/17/2018, 11:15 AM

## 2018-06-17 NOTE — Progress Notes (Signed)
Physical Therapy Discharge Patient Details Name: RIANNAH STAGNER MRN: [id number] DOB: Jan 13, 1941 Today's Date: 06/17/2018 Time:  -     Patient discharged from PT services secondary to medical decline - will need to re-order PT to resume therapy services. Patient to transition to [place] per notes.  Please see latest therapy progress note for current level of functioning and progress toward goals.    P  GP     Claretha Cooper 06/17/2018, 8:24 AM [place] Pager [phone] Office [phone]

## 2018-06-17 NOTE — Progress Notes (Signed)
PROGRESS NOTE  [name] [name] NFA:[id number] DOB: 09-27-1940 DOA: 05/30/2018 PCP: Marin Olp, MD  Brief Narrative: 77 year old woman PMH small cell lung cancer with metastatic disease to the liver and spine with cord compression, on systemic chemotherapy and palliative radiotherapy, presented from the oncology office with abdominal pain and bright red blood per rectum.  Echocardiogram showed left ventricular thrombus and cardiology recommended anticoagulation.  Palliative medicine consulted and patient is opted for hospice care at long-term facility.  Assessment/Plan Small cell lung cancer with metastatic disease to thoracic spine causing cord compression, cauda equina syndrome, partial paraplegia, fecal incontinence and urinary retention --Patient has elected to pursue no further chemotherapy  --Patient wishes to go to a long-term care facility with hospice  --Keep Foley and PICC line on discharge --Plan transition to long-term care; continue to await bed  Left ventricular thrombus seen by CT.  After much deliberation and consultation with gastroenterology, cardiology and oncology, patient has elected to take anticoagulation. --Continue Eliquis --Repeat echocardiogram in 3 weeks to reevaluate  GI bleed, bright red blood per rectum, complicated by chronic anemia in the setting of malignancy.  GI consulted thought secondary to rectal mucosal bleeding or internal hemorrhoidal bleeding. --GI recommended no acute intervention.  Severe GERD --Poorly controlled.  Continue Protonix, Carafate, Pepcid; Tums as needed  Chronic pain secondary to malignancy --Continue pain control  Hypothyroidism --Continue levothyroxine  Bilateral lower extremity edema, Dopplers negative for DVT  Emphysematous cystitis with mild right-sided hydronephrosis and hydroureter seen on CT 12/12, treated with meropenem.   Continue to await long-term care bed.  Plan for hospice at a long-term care  facility.  DVT prophylaxis: apixaban Code Status: DNR Family Communication: Husband at bedside 12/30 Disposition Plan: LTC with hospice    Murray Hodgkins, MD  [facility] Direct contact: [phone] --Via [place]  --[email or web address]; password [email or web address]  7PM-7AM contact night coverage as above 06/17/2018, 3:49 PM  LOS: 18 days   Consultants:  Gastroenterology  Cardiology  Palliative medicine  Oncology  Procedures:  Echo Study Conclusions  - Left ventricle: The cavity size was normal. Systolic function was   mildly reduced. The estimated ejection fraction was in the range   of 45% to 50%. Hypokinesis of the apical myocardium. Features are   consistent with a pseudonormal left ventricular filling pattern,   with concomitant abnormal relaxation and increased filling   pressure (grade 2 diastolic dysfunction). There was a thrombus. - Regional wall motion abnormality: Hypokinesis of the mid   anteroseptal, mid inferoseptal, apical inferior, apical septal,   and apical myocardium. - Mitral valve: Calcified annulus. Mildly thickened leaflets . Late   systolic prolapse, involving the anterior leaflet. There was   moderate late systolic regurgitation due to prolapse. - Left atrium: The atrium was mildly dilated.  Impressions:  - Echo contrast (definity) used to evaluate for LV thrombus.   The only image suggestive of thrombus is image 47 (screenshot   saved with my annotation with arrow). There is a dense structure   that comes in and out of view at approximately 10-11 o&'clock on   that image. This could align with the mass seen on CT imaging, as   the shape/size are similar. Favor LV thrombus.  Antimicrobials:    Interval history/Subjective: Eating okay.  Still has GERD.  Willing to have hospice at long-term care facility.  Objective: Vitals:  Vitals:   06/17/18 0439 06/17/18 1416  BP: 124/76 90/63  Pulse: 77 82  Resp: 18 16  Temp:  98 F (36.7 C)  98.6 F (37 C)  SpO2: 92% 93%    Exam:  Constitutional:   . Appears calm and comfortable ENMT:  . Hard of hearing Respiratory:  . CTA bilaterally, no w/r/r.  . Respiratory effort normal.  Cardiovascular:  . RRR, no m/r/g . No LE extremity edema   Psychiatric:  . Mental status o Mood, affect appropriate  I have personally reviewed the following:   Data: . No new data  Scheduled Meds: . apixaban  5 mg Oral BID  . dexamethasone  4 mg Oral Daily  . famotidine  20 mg Oral BID  . feeding supplement (ENSURE ENLIVE)  237 mL Oral BID BM  . fentaNYL  12.5 mcg Transdermal Q72H   And  . fentaNYL  25 mcg Transdermal Q72H  . levothyroxine  75 mcg Oral QAC breakfast  . lidocaine  2 patch Transdermal Q24H  . nystatin  5 mL Oral QID  . pantoprazole  40 mg Oral BID WC  . polyethylene glycol  17 g Oral BID  . pregabalin  25 mg Oral BID  . senna  1 tablet Oral BID  . sodium chloride flush  3 mL Intravenous Q12H  . sucralfate  1 g Oral TID WC & HS  . traZODone  50 mg Oral QHS   Continuous Infusions: . sodium chloride 250 mL (06/15/18 1550)    Principal Problem:   Small cell carcinoma of lower lobe of left lung (HCC) Active Problems:   Hypothyroidism   Spinal cord compression due to malignant neoplasm metastatic to spine (HCC)   Left ventricular thrombus without MI   BRBPR (bright red blood per rectum)   Complete lesion at T6 level of thoracic spinal cord (HCC)   Urinary and fecal incontinence   Rectal bleeding   ESBL (extended spectrum beta-lactamase) producing bacteria infection   Emphysematous cystitis   LOS: 18 days

## 2018-06-17 NOTE — Progress Notes (Signed)
OT Cancellation Note/Discharge  Patient Details Name: [name] [name] MRN: [id number] DOB: April 27, 1941   Cancelled Treatment:    Reason Eval/Treat Not Completed: Other (comment). Plan to DC to [place]. Ot will sign off at this time.   Ramond Dial, OT/L   Acute OT Clinical Specialist Acute Rehabilitation Services Pager [phone] Office [phone]  06/17/2018, 8:18 AM

## 2018-06-18 ENCOUNTER — Inpatient Hospital Stay: Payer: Medicare Other

## 2018-06-18 LAB — BASIC METABOLIC PANEL
Anion gap: 8 (ref 5–15)
BUN: 35 mg/dL — ABNORMAL HIGH (ref 8–23)
CO2: 26 mmol/L (ref 22–32)
Calcium: 8.5 mg/dL — ABNORMAL LOW (ref 8.9–10.3)
Chloride: 101 mmol/L (ref 98–111)
Creatinine, Ser: 0.86 mg/dL (ref 0.44–1.00)
GFR calc non Af Amer: >60 mL/min (ref >60)
Glucose, Bld: 109 mg/dL — ABNORMAL HIGH (ref 70–99)
Potassium: 3.8 mmol/L (ref 3.5–5.1)
SODIUM: 135 mmol/L (ref 135–145)

## 2018-06-18 NOTE — Progress Notes (Signed)
PROGRESS NOTE  [name] [name] NFA:[id number] DOB: 03/13/1941 DOA: 05/30/2018 PCP: Marin Olp, MD  Brief Narrative: 77 year old woman PMH small cell lung cancer with metastatic disease to the liver and spine with cord compression, on systemic chemotherapy and palliative radiotherapy, presented from the oncology office with abdominal pain and bright red blood per rectum.  Echocardiogram showed left ventricular thrombus and cardiology recommended anticoagulation.  Palliative medicine consulted and patient is opted for hospice care at long-term facility.  Assessment/Plan Small cell lung cancer with metastatic disease to thoracic spine causing cord compression, cauda equina syndrome, partial paraplegia, fecal incontinence and urinary retention --Patient has elected to pursue no further chemotherapy  --Patient wishes to go to a long-term care facility with hospice  --Keep Foley and PICC line on discharge --Plan transition to long-term care; continue to await bed for the past several days no new issues  Left ventricular thrombus seen by CT.  After much deliberation and consultation with gastroenterology, cardiology and oncology, patient has elected to take anticoagulation. --Continue Eliquis --Repeat echocardiogram in 3 weeks on about 06/21/2018 to determine further management  GI bleed, bright red blood per rectum, complicated by chronic anemia in the setting of malignancy.  GI consulted thought secondary to rectal mucosal bleeding or internal hemorrhoidal bleeding. --GI recommended no acute intervention.  Severe GERD --Poorly controlled.  Continue Protonix, Carafate, Pepcid; Tums as needed-discontinued to be poorly controlled  Chronic pain secondary to malignancy --Continue pain control  Hypothyroidism --Continue levothyroxine  Bilateral lower extremity edema, Dopplers negative for DVT  Emphysematous cystitis with mild right-sided hydronephrosis and hydroureter seen on CT 12/12,  treated with meropenem.   Continue to await long-term care bed.  Plan for hospice at a long-term care facility.  Social worker is aware  DVT prophylaxis: apixaban Code Status: DNR Family Communication: Husband at bedside 12/30 Disposition Plan: LTC with hospice    Verneita Griffes, MD [facility] 1:50 PM  06/18/2018, 1:47 PM  LOS: 19 days   Consultants:  Gastroenterology  Cardiology  Palliative medicine  Oncology  Procedures:  Echo Study Conclusions  - Left ventricle: The cavity size was normal. Systolic function was   mildly reduced. The estimated ejection fraction was in the range   of 45% to 50%. Hypokinesis of the apical myocardium. Features are   consistent with a pseudonormal left ventricular filling pattern,   with concomitant abnormal relaxation and increased filling   pressure (grade 2 diastolic dysfunction). There was a thrombus. - Regional wall motion abnormality: Hypokinesis of the mid   anteroseptal, mid inferoseptal, apical inferior, apical septal,   and apical myocardium. - Mitral valve: Calcified annulus. Mildly thickened leaflets . Late   systolic prolapse, involving the anterior leaflet. There was   moderate late systolic regurgitation due to prolapse. - Left atrium: The atrium was mildly dilated.  Impressions:  - Echo contrast (definity) used to evaluate for LV thrombus.   The only image suggestive of thrombus is image 62 (screenshot   saved with my annotation with arrow). There is a dense structure   that comes in and out of view at approximately 10-11 o&'clock on   that image. This could align with the mass seen on CT imaging, as   the shape/size are similar. Favor LV thrombus.  Antimicrobials:    Interval history/Subjective: Eating okay.  Still has GERD.  Willing to have hospice at long-term care facility.  Objective: Vitals:  Vitals:   06/18/18 0644 06/18/18 1308  BP: 140/74 114/73  Pulse: 75 81  Resp: 20 16  Temp: 98.6  F (37 C) 98.5 F (36.9 C)  SpO2: 95% 94%    Exam:  o Awake pleasant no distress eating at bedside complaining of some abdominal pain o Good bowel movement yesterday no fever no chills no nausea no vomiting o Chest is clear without added sound no rales no rhonchi o Extremity. o Neurologically intact moving all 4 limbs equally without deficit   I have personally reviewed the following:   Data: . No new data  Scheduled Meds: . apixaban  5 mg Oral BID  . dexamethasone  4 mg Oral Daily  . famotidine  20 mg Oral BID  . feeding supplement (ENSURE ENLIVE)  237 mL Oral BID BM  . fentaNYL  12.5 mcg Transdermal Q72H   And  . fentaNYL  25 mcg Transdermal Q72H  . levothyroxine  75 mcg Oral QAC breakfast  . lidocaine  2 patch Transdermal Q24H  . nystatin  5 mL Oral QID  . pantoprazole  40 mg Oral BID WC  . polyethylene glycol  17 g Oral BID  . pregabalin  25 mg Oral BID  . senna  1 tablet Oral BID  . sodium chloride flush  3 mL Intravenous Q12H  . sucralfate  1 g Oral TID WC & HS  . traZODone  50 mg Oral QHS   Continuous Infusions: . sodium chloride 250 mL (06/15/18 1550)    Principal Problem:   Small cell carcinoma of lower lobe of left lung (HCC) Active Problems:   Hypothyroidism   Spinal cord compression due to malignant neoplasm metastatic to spine (HCC)   Left ventricular thrombus without MI   BRBPR (bright red blood per rectum)   Complete lesion at T6 level of thoracic spinal cord (HCC)   Urinary and fecal incontinence   Rectal bleeding   ESBL (extended spectrum beta-lactamase) producing bacteria infection   Emphysematous cystitis   LOS: 19 days

## 2018-06-19 NOTE — Progress Notes (Signed)
No new changes-son has questions about gaurdianship as well as how to help deal with mental decline and decision making--mentioned an attorney and or Education officer, museum can guide Awaiting LTC bed

## 2018-06-19 NOTE — Progress Notes (Signed)
   06/19/18 1400  Clinical Encounter Type  Visited With Patient and family together  Visit Type Initial  Referral From Palliative care team  Consult/Referral To Chaplain  The chaplain introduced herself to the Pt. and Pt. son-Billy.  The chaplain was welcomed into the room.  The Pt. shared strong conversation on the topic of how it is the medical team and/or her family's decision to determine the Pt. trajectory of care. The Pt. observed the Pt. son's emotional stress in the expectation of having to hold his mother's healthcare decision.  The Pt. recognizes her faith as an integral part of the decision, holding more importance than her desired quality of life.  The chaplain offered F/U spiritual care, after prayer. Both the son and the Pt. accepted.

## 2018-06-19 NOTE — Plan of Care (Signed)
  Problem: Health Behavior/Discharge Planning: Goal: Ability to manage health-related needs will improve Outcome: Progressing   Problem: Clinical Measurements: Goal: Will remain free from infection Outcome: Progressing Goal: Diagnostic test results will improve Outcome: Progressing Goal: Respiratory complications will improve Outcome: Progressing   Problem: Pain Managment: Goal: General experience of comfort will improve Outcome: Progressing

## 2018-06-20 ENCOUNTER — Inpatient Hospital Stay: Payer: Medicare Other

## 2018-06-20 NOTE — Progress Notes (Signed)
PROGRESS NOTE  [name] [name]:[id number] DOB: 1940/10/17 DOA: 05/30/2018 PCP: Marin Olp, MD  Brief Narrative: 78 year old woman PMH small cell lung cancer with metastatic disease to the liver and spine with cord compression, on systemic chemotherapy and palliative radiotherapy, presented from the oncology office with abdominal pain and bright red blood per rectum.  Echocardiogram showed left ventricular thrombus and cardiology recommended anticoagulation.  Palliative medicine consulted and patient is opted for hospice care at long-term facility.  Assessment/Plan Small cell lung cancer with metastatic disease to thoracic spine causing cord compression, cauda equina syndrome, partial paraplegia, fecal incontinence and urinary retention --Patient has elected to pursue no further chemotherapy  --for long-term care facility with hospice  --Keep Foley and PICC line on discharge --Plan transition to long-term care; continue to await bed for the past several days no new issues  Left ventricular thrombus seen by CT.  After much deliberation and consultation with gastroenterology, cardiology and oncology, patient has elected to take anticoagulation. --Continue Eliquis--periodic labs in am at request of son --Repeat echocardiogram in 3 weeks on about 06/21/2018 to determine further management  GI bleed, bright red blood per rectum, complicated by chronic anemia in the setting of malignancy.  GI consulted thought secondary to rectal mucosal bleeding or internal hemorrhoidal bleeding. --GI recommended no acute intervention.  Severe GERD --Poorly controlled.  Continue Protonix, Carafate, Pepcid; Tums as needed-symptomatic still despite max therapy  Chronic pain secondary to malignancy --Continue pain control duragesic 37.5, Dilaudid 0.5 q2 prn  Hypothyroidism --Continue levothyroxine  Bilateral lower extremity edema, Dopplers negative for DVT  Emphysematous cystitis with mild  right-sided hydronephrosis and hydroureter seen on CT 12/12, treated with meropenem.   Continue to await long-term care bed.  Plan for hospice at a long-term care facility.  Social worker is aware  DVT prophylaxis: apixaban Code Status: DNR Family Communication: d/w sons at the bedside periodically Disposition Plan: LTC with hospice    Verneita Griffes, MD [facility] 1:50 PM  06/20/2018, 1:50 PM  LOS: 21 days   Consultants:  Gastroenterology  Cardiology  Palliative medicine  Oncology  Procedures:  Echo Study Conclusions  - Left ventricle: The cavity size was normal. Systolic function was   mildly reduced. The estimated ejection fraction was in the range   of 45% to 50%. Hypokinesis of the apical myocardium. Features are   consistent with a pseudonormal left ventricular filling pattern,   with concomitant abnormal relaxation and increased filling   pressure (grade 2 diastolic dysfunction). There was a thrombus. - Regional wall motion abnormality: Hypokinesis of the mid   anteroseptal, mid inferoseptal, apical inferior, apical septal,   and apical myocardium. - Mitral valve: Calcified annulus. Mildly thickened leaflets . Late   systolic prolapse, involving the anterior leaflet. There was   moderate late systolic regurgitation due to prolapse. - Left atrium: The atrium was mildly dilated.  Impressions:  - Echo contrast (definity) used to evaluate for LV thrombus.   The only image suggestive of thrombus is image 35 (screenshot   saved with my annotation with arrow). There is a dense structure   that comes in and out of view at approximately 10-11 o&'clock on   that image. This could align with the mass seen on CT imaging, as   the shape/size are similar. Favor LV thrombus.  Antimicrobials:    Interval history/Subjective:  eatign drinking and passing non bloody stool Upset in wet clothes No cp no fever no sob  Objective: Vitals:  Vitals:  06/20/18  0404 06/20/18 1204  BP: 117/68 120/73  Pulse: 79 77  Resp: 16 16  Temp: 97.7 F (36.5 C) 98.4 F (36.9 C)  SpO2: 96% 96%    Exam:  o Awake no distress o No lan no thyromegally o Chest is clear without added sound no rales no rhonchi o Extremity. o Neurologically intact moving all 4 limbs equally without deficit   I have personally reviewed the following:   Data: . No new data  Scheduled Meds: . apixaban  5 mg Oral BID  . dexamethasone  4 mg Oral Daily  . famotidine  20 mg Oral BID  . feeding supplement (ENSURE ENLIVE)  237 mL Oral BID BM  . fentaNYL  12.5 mcg Transdermal Q72H   And  . fentaNYL  25 mcg Transdermal Q72H  . levothyroxine  75 mcg Oral QAC breakfast  . lidocaine  2 patch Transdermal Q24H  . nystatin  5 mL Oral QID  . pantoprazole  40 mg Oral BID WC  . polyethylene glycol  17 g Oral BID  . pregabalin  25 mg Oral BID  . senna  1 tablet Oral BID  . sodium chloride flush  3 mL Intravenous Q12H  . sucralfate  1 g Oral TID WC & HS  . traZODone  50 mg Oral QHS   Continuous Infusions: . sodium chloride 10 mL/hr at 06/20/18 0100    Principal Problem:   Small cell carcinoma of lower lobe of left lung (HCC) Active Problems:   Hypothyroidism   Spinal cord compression due to malignant neoplasm metastatic to spine (HCC)   Left ventricular thrombus without MI   BRBPR (bright red blood per rectum)   Complete lesion at T6 level of thoracic spinal cord (HCC)   Urinary and fecal incontinence   Rectal bleeding   ESBL (extended spectrum beta-lactamase) producing bacteria infection   Emphysematous cystitis   LOS: 21 days

## 2018-06-20 NOTE — Progress Notes (Signed)
Palliative Care Progress Note  I spoke with the [place] Social Worker at [facility]. They have a semi-private room available. Additionally, discussed the patient's condition with them and they are willing to help in these circumstances including working with hospice services to make sure patient gets the very best end of life care possible.I have updated [name] [name]'s son Haskel Khan. [place] will call him directly to discuss bed offer and to discuss transfer details. I have given her my contact information and will provide any additional medical information if needed. Will need to notify [facility] of this referral.  Lane Hacker, DO Palliative Medicine

## 2018-06-20 NOTE — Progress Notes (Signed)
CSW attempted to contact patients son, Abe People, regarding disposition plans. No answer- voicemail left for return call.   Kingsley Spittle, [place]  [phone]

## 2018-06-20 NOTE — Plan of Care (Signed)
  Problem: Health Behavior/Discharge Planning: Goal: Ability to manage health-related needs will improve Outcome: Progressing   Problem: Clinical Measurements: Goal: Diagnostic test results will improve Outcome: Progressing   

## 2018-06-21 ENCOUNTER — Inpatient Hospital Stay: Payer: Medicare Other

## 2018-06-21 LAB — COMPREHENSIVE METABOLIC PANEL
ALT: 51 U/L — ABNORMAL HIGH (ref 0–44)
AST: 22 U/L (ref 15–41)
Albumin: 2.5 g/dL — ABNORMAL LOW (ref 3.5–5.0)
Alkaline Phosphatase: 136 U/L — ABNORMAL HIGH (ref 38–126)
Anion gap: 8 (ref 5–15)
BUN: 34 mg/dL — ABNORMAL HIGH (ref 8–23)
CALCIUM: 8.2 mg/dL — AB (ref 8.9–10.3)
CO2: 26 mmol/L (ref 22–32)
Chloride: 101 mmol/L (ref 98–111)
Creatinine, Ser: 0.61 mg/dL (ref 0.44–1.00)
GFR calc Af Amer: >60 mL/min (ref >60)
GFR calc non Af Amer: >60 mL/min (ref >60)
Glucose, Bld: 120 mg/dL — ABNORMAL HIGH (ref 70–99)
Potassium: 4.3 mmol/L (ref 3.5–5.1)
Sodium: 135 mmol/L (ref 135–145)
Total Bilirubin: 0.3 mg/dL (ref 0.3–1.2)
Total Protein: 5.3 g/dL — ABNORMAL LOW (ref 6.5–8.1)

## 2018-06-21 LAB — CBC WITH DIFFERENTIAL/PLATELET
ABS IMMATURE GRANULOCYTES: 0.43 10*3/uL — AB (ref 0.00–0.07)
Basophils Absolute: 0 10*3/uL (ref 0.0–0.1)
Basophils Relative: 0 %
Eosinophils Absolute: 0 10*3/uL (ref 0.0–0.5)
Eosinophils Relative: 0 %
HCT: 26.7 % — ABNORMAL LOW (ref 36.0–46.0)
HEMOGLOBIN: 8 g/dL — AB (ref 12.0–15.0)
Immature Granulocytes: 4 %
Lymphocytes Relative: 6 %
Lymphs Abs: 0.7 10*3/uL (ref 0.7–4.0)
MCH: 28.5 pg (ref 26.0–34.0)
MCHC: 30 g/dL (ref 30.0–36.0)
MCV: 95 fL (ref 80.0–100.0)
MONO ABS: 1.2 10*3/uL — AB (ref 0.1–1.0)
Monocytes Relative: 9 %
NEUTROS ABS: 10.1 10*3/uL — AB (ref 1.7–7.7)
Neutrophils Relative %: 81 %
Platelets: 571 10*3/uL — ABNORMAL HIGH (ref 150–400)
RBC: 2.81 MIL/uL — ABNORMAL LOW (ref 3.87–5.11)
RDW: 23 % — ABNORMAL HIGH (ref 11.5–15.5)
WBC: 12.3 10*3/uL — ABNORMAL HIGH (ref 4.0–10.5)
nRBC: 0.5 % — ABNORMAL HIGH (ref 0.0–0.2)

## 2018-06-21 MED ORDER — HEPARIN SOD (PORK) LOCK FLUSH 100 UNIT/ML IV SOLN
250.0000 [IU] | INTRAVENOUS | Status: AC | PRN
  Administered 2018-06-21: 250 [IU]

## 2018-06-21 MED ORDER — MENTHOL 3 MG MT LOZG
1.0000 | LOZENGE | OROMUCOSAL | Status: DC | PRN
  Filled 2018-06-21: qty 9

## 2018-06-21 NOTE — Discharge Summary (Signed)
Physician Discharge Summary  [name] HWE:[id number] DOB: 06/03/1941 DOA: 05/30/2018  PCP: Marin Olp, MD  Admit date: 05/30/2018 Discharge date: 06/21/2018  Time spent: 15 minutes  Recommendations for Outpatient Follow-up:  1. Recommending hospice to follow-up with patient and family at facility 2. Continue Eliquis for the time being 3. Keep PICC line in place for medications  Discharge Diagnoses:  Principal Problem:   Small cell carcinoma of lower lobe of left lung (HCC) Active Problems:   Hypothyroidism   Spinal cord compression due to malignant neoplasm metastatic to spine (HCC)   Left ventricular thrombus without MI   BRBPR (bright red blood per rectum)   Complete lesion at T6 level of thoracic spinal cord (HCC)   Urinary and fecal incontinence   Rectal bleeding   ESBL (extended spectrum beta-lactamase) producing bacteria infection   Emphysematous cystitis   Discharge Condition: Guarded  Diet recommendation: Comfort  Filed Weights   06/19/18 0444 06/20/18 0404 06/21/18 0501  Weight: 67 kg 68.2 kg 65.7 kg    History of present illness:  78 year old woman PMH small cell lung cancer with metastatic disease to the liver and spine with cord compression, on systemic chemotherapy and palliative radiotherapy, presented from the oncology office with abdominal pain and bright red blood per rectum.  Echocardiogram showed left ventricular thrombus and cardiology recommended anticoagulation.  Palliative medicine consulted and patient is opted for hospice care at long-term facility.  Hospital Course:  Small cell lung cancer with metastatic disease to thoracic spine causing cord compression, cauda equina syndrome, partial paraplegia, fecal incontinence and urinary retention --Patient has elected to pursue no further chemotherapy  --for long-term care facility with hospice  --Keep Foley and PICC line on discharge  Left ventricular thrombus seen by CT.  After much  deliberation and consultation with gastroenterology, cardiology and oncology, patient has elected to take anticoagulation. --Continue Eliquis--periodic labs in am at request of son --Repeat echocardiogram in 3 weeks on about 06/21/2018 to determine further management  GI bleed, bright red blood per rectum, complicated by chronic anemia in the setting of malignancy.  GI consulted thought secondary to rectal mucosal bleeding or internal hemorrhoidal bleeding. --GI recommended no acute intervention.  Severe GERD --Poorly controlled.  Continue Protonix, Carafate, Pepcid; Tums as needed-symptomatic still despite max therapy continue meds  Chronic pain secondary to malignancy --Continue pain control duragesic 37.5, Dilaudid 0.5 q2 prn  Hypothyroidism --Continue levothyroxine  Bilateral lower extremity edema, Dopplers negative for DVT  Emphysematous cystitis with mild right-sided hydronephrosis and hydroureter seen on CT 12/12, treated with meropenem.   Discharge Exam: Vitals:   06/20/18 2241 06/21/18 0501  BP: 109/70 130/81  Pulse: 71 66  Resp: 14 16  Temp: 97.9 F (36.6 C) 97.8 F (36.6 C)  SpO2: 95% 98%    General: Awake alert pleasant no distress no fever no chills Cardiovascular: S1-S2 no murmur rub or gallop Respiratory: Chest is clear Abdomen soft no rebound no guarding Weak unable to really move  Discharge Instructions    Allergies as of 06/21/2018      Reactions   Atorvastatin    Intolerant to all statins   Cyclobenzaprine Other (See Comments)   Bradycardia, hypotension   Ezetimibe     INTOL to Zetia   Rosuvastatin    intolerant to all statins   Gadolinium Derivatives Nausea Only   Pt became very nauseous after gadolinium administration//lh   Nicoderm [nicotine] Rash   Unable to use the patches---caused severe rash to area placed  Medication List    TAKE these medications   ALPRAZolam 0.5 MG tablet Commonly known as:  XANAX Take 1 tablet (0.5 mg  total) by mouth 2 (two) times daily as needed for anxiety or sleep.   apixaban 5 MG Tabs tablet Commonly known as:  ELIQUIS Take 1 tablet (5 mg total) by mouth 2 (two) times daily.   dexamethasone 4 MG tablet Commonly known as:  DECADRON Take 1 tablet (4 mg total) by mouth daily.   famotidine 20 MG tablet Commonly known as:  PEPCID Take 1 tablet (20 mg total) by mouth 2 (two) times daily.   feeding supplement (ENSURE ENLIVE) Liqd Take 237 mLs by mouth 2 (two) times daily between meals.   fentaNYL 25 MCG/HR patch Commonly known as:  DURAGESIC - dosed mcg/hr Place 1 patch (25 mcg total) onto the skin every 3 (three) days.   levothyroxine 75 MCG tablet Commonly known as:  SYNTHROID, LEVOTHROID TAKE 1 TABLET(75 MCG) BY MOUTH DAILY What changed:  See the new instructions.   lidocaine 2 % solution Commonly known as:  XYLOCAINE Use as directed 15 mLs in the mouth or throat every 3 (three) hours as needed for mouth pain (mouth ulcers).   morphine 20 MG/5ML solution Take 1.3 mLs (5.2 mg total) by mouth every 4 (four) hours as needed for pain.   nicotine polacrilex 2 MG gum Commonly known as:  NICORETTE Take 1 each (2 mg total) by mouth as needed for smoking cessation.   nystatin 100000 UNIT/ML suspension Commonly known as:  MYCOSTATIN Take 5 mLs (500,000 Units total) by mouth 4 (four) times daily.   omeprazole 40 MG capsule Commonly known as:  PRILOSEC Take 1 capsule (40 mg total) by mouth daily.   ondansetron 4 MG disintegrating tablet Commonly known as:  ZOFRAN-ODT Take 1 tablet (4 mg total) by mouth every 8 (eight) hours as needed for nausea or vomiting.   polyethylene glycol packet Commonly known as:  MIRALAX / GLYCOLAX Take 17 g by mouth daily.   pregabalin 25 MG capsule Commonly known as:  LYRICA Take 1 capsule (25 mg total) by mouth 2 (two) times daily.   prochlorperazine 10 MG tablet Commonly known as:  COMPAZINE Take 1 tablet (10 mg total) by mouth every 6  (six) hours as needed for nausea or vomiting.   senna 8.6 MG Tabs tablet Commonly known as:  SENOKOT Take 1 tablet (8.6 mg total) by mouth 2 (two) times daily.   sucralfate 1 GM/10ML suspension Commonly known as:  CARAFATE Take 10 mLs (1 g total) by mouth 4 (four) times daily -  with meals and at bedtime.   traZODone 50 MG tablet Commonly known as:  DESYREL Take 1 tablet (50 mg total) by mouth at bedtime.      Allergies  Allergen Reactions  . Atorvastatin     Intolerant to all statins   . Cyclobenzaprine Other (See Comments)    Bradycardia, hypotension  . Ezetimibe      INTOL to Zetia  . Rosuvastatin     intolerant to all statins  . Gadolinium Derivatives Nausea Only    Pt became very nauseous after gadolinium administration//lh  . Nicoderm [Nicotine] Rash    Unable to use the patches---caused severe rash to area placed   Follow-up Information    Marin Olp, MD. Schedule an appointment as soon as possible for a visit in 1 week(s).   Specialty:  Family Medicine Contact information: [place]  [address] [phone]        Sanda Klein, MD .   Specialty:  Cardiology Contact information: [address] [place] [address] [phone]            The results of significant diagnostics from this hospitalization (including imaging, microbiology, ancillary and laboratory) are listed below for reference.    Significant Diagnostic Studies: X-ray Chest Pa And Lateral  Result Date: 05/30/2018 CLINICAL DATA:  78 y/o F; history of lung cancer with metastasis. Completed radiation treatment. Currently receiving chemotherapy. Productive cough. EXAM: CHEST - 2 VIEW COMPARISON:  04/27/2018 chest radiograph. 04/28/2018 CT chest. FINDINGS: Stable normal cardiac silhouette given projection and technique. Right PICC line tip projects over lower SVC. Reticular and patchy opacities greatest in the left lung base. Mass in the left  mid lung zone is less evident when compared with prior chest radiograph. No acute osseous abnormality is evident. IMPRESSION: 1. Reticular and patchy opacities greatest in the left lung base probably represents interstitial pulmonary edema. Pneumonia is possible. 2. Left mid lung zone mass is less evident when compared with prior chest radiograph. Electronically Signed   By: Kristine Garbe M.D.   On: 05/30/2018 22:41   Ct Abdomen Pelvis W Contrast  Result Date: 05/30/2018 CLINICAL DATA:  Abdominal pain. Clinical concern for diverticulitis. Undergoing chemotherapy for metastatic lung cancer. Recently completed radiation treatment. Current smoker. EXAM: CT ABDOMEN AND PELVIS WITH CONTRAST TECHNIQUE: Multidetector CT imaging of the abdomen and pelvis was performed using the standard protocol following bolus administration of intravenous contrast. CONTRAST:  163mL ISOVUE-300 IOPAMIDOL (ISOVUE-300) INJECTION 61% COMPARISON:  04/01/2018. Chest, abdomen and pelvis CT dated 04/28/2018. FINDINGS: Lower chest: Oval area of low density within the apical portion of the left ventricle. This measures 1.9 x 1.2 cm on image number 9 series 2 and 1.9 cm in length on sagittal image number 93. This was not previously present. Mild bibasilar atelectasis/scarring. Mild diffuse peribronchial thickening and bullous changes. Hepatobiliary: The previously demonstrated 3.8 x 2.7 cm mass in segment 8 of the liver on the right currently measures 1.6 x 1.3 cm in maximum diameter on image number 9 series 2. The previously demonstrated 3.4 x 2.8 cm oval mass in segment 4B/5, currently measures 2.1 cm in maximum diameter on image number 24 series 2. The previously demonstrated 1.5 x 1.5 cm mass in the central aspect of segment 4A is not visualized today. Also demonstrated is interval visualization of a 6 mm probable cyst in the posterior segment of the right lobe of the liver on image number 16 series 2. Unremarkable gallbladder.  Pancreas: Diffuse pancreatic atrophy, most pronounced involving the head and neck. Spleen: Normal in size without focal abnormality. Adrenals/Urinary Tract: Normal appearing adrenal glands. Small right renal cysts. Small left renal vascular calcification. Mild diffuse wall thickening and enhancement of the right ureter and right renal collecting system with mild dilatation. Normal appearing left ureter. Gas throughout the walls of the urinary bladder with nondependent air or gas in the bladder. Stomach/Bowel: Large amount of stool throughout the colon. Mild sigmoid colon diverticulosis without evidence of diverticulitis. Unremarkable stomach and small bowel. No evidence of appendicitis. Vascular/Lymphatic: Atheromatous arterial calcifications without aneurysm. No enlarged lymph nodes. Reproductive: Uterus and bilateral adnexa are unremarkable. Other: Small left inguinal hernia containing fat. Musculoskeletal: Bilateral sacral ala fractures are again demonstrated with interval diffuse patchy bone sclerosis and nonunion. Also again demonstrated are right inferior pubic ramus and ischial/anterior acetabular fractures. Mild increase in associated callus formation with  nonunion. Interval visualization of a mildly comminuted right superior pubic ramus fracture with patchy bone sclerosis. Moderate right hip degenerative changes. Lumbar and lower thoracic spine degenerative changes with progressive patchy sclerosis involving multiple lumbar and lower thoracic vertebral bodies. IMPRESSION: 1. Interval emphysematous cystitis. 2. Interval visualization of a 1.9 x 1.2 x 1.9 cm apical left ventricular clot. This places the patient at increased risk for embolic stroke or other vascular occlusions. 3. Interval mild right hydronephrosis and hydroureter with mild diffuse uroepithelial thickening and enhancement, compatible with infection. 4. Interval decrease in size of the previously demonstrated liver metastases. 5. Progressive  sclerotic bony metastatic disease with nonunion of multiple previous fractures as described above as well as nonunion of an interval comminuted superior pubic ramus fracture on the right. 6. Mild changes of COPD and chronic bronchitis. 7. Large amount of stool throughout the colon. 8. Mild sigmoid diverticulosis without evidence of diverticulitis. These results were called by telephone at the time of interpretation on 05/30/2018 at 8:11 pm to Dr. Lacretia Leigh , who verbally acknowledged these results. Electronically Signed   By: Claudie Revering M.D.   On: 05/30/2018 20:23   Vas [place] Lower Extremity Venous (dvt)  Result Date: 06/02/2018  Lower Venous Study Indications: Edema.  Limitations: Patient positioning. Performing Technologist: Abram Sander RVS  Examination Guidelines: A complete evaluation includes B-mode imaging, spectral Doppler, color Doppler, and power Doppler as needed of all accessible portions of each vessel. Bilateral testing is considered an integral part of a complete examination. Limited examinations for reoccurring indications may be performed as noted.  Right Venous Findings: +---------+---------------+---------+-----------+----------+--------------+          CompressibilityPhasicitySpontaneityPropertiesSummary        +---------+---------------+---------+-----------+----------+--------------+ CFV      Full           Yes      Yes                                 +---------+---------------+---------+-----------+----------+--------------+ SFJ      Full                                                        +---------+---------------+---------+-----------+----------+--------------+ FV Prox  Full                                                        +---------+---------------+---------+-----------+----------+--------------+ FV Mid   Full                                                        +---------+---------------+---------+-----------+----------+--------------+  FV DistalFull                                                        +---------+---------------+---------+-----------+----------+--------------+ PFV      Full                                                        +---------+---------------+---------+-----------+----------+--------------+  POP      Full           Yes      Yes                                 +---------+---------------+---------+-----------+----------+--------------+ PTV                                                   Not visualized +---------+---------------+---------+-----------+----------+--------------+ PERO                                                  Not visualized +---------+---------------+---------+-----------+----------+--------------+  Left Venous Findings: +---------+---------------+---------+-----------+----------+--------------+          CompressibilityPhasicitySpontaneityPropertiesSummary        +---------+---------------+---------+-----------+----------+--------------+ CFV      Full           Yes      Yes                                 +---------+---------------+---------+-----------+----------+--------------+ SFJ      Full                                                        +---------+---------------+---------+-----------+----------+--------------+ FV Prox  Full                                                        +---------+---------------+---------+-----------+----------+--------------+ FV Mid   Full                                                        +---------+---------------+---------+-----------+----------+--------------+ FV DistalFull                                                        +---------+---------------+---------+-----------+----------+--------------+ PFV      Full                                                        +---------+---------------+---------+-----------+----------+--------------+ POP      Full            Yes      Yes                                 +---------+---------------+---------+-----------+----------+--------------+  PTV                                                   Not visualized +---------+---------------+---------+-----------+----------+--------------+ PERO                                                  Not visualized +---------+---------------+---------+-----------+----------+--------------+    Summary: Right: There is no evidence of deep vein thrombosis in the lower extremity. However, portions of this examination were limited- see technologist comments above. No cystic structure found in the popliteal fossa. Left: There is no evidence of deep vein thrombosis in the lower extremity. However, portions of this examination were limited- see technologist comments above. No cystic structure found in the popliteal fossa.  *See table(s) above for measurements and observations. Electronically signed by Deitra Mayo MD on 06/02/2018 at 1:51:44 PM.    Final     Microbiology: No results found for this or any previous visit (from the past 240 hour(s)).   Labs: Basic Metabolic Panel: Recent Labs  Lab 06/16/18 0502 06/18/18 1113 06/21/18 0405  NA 135 135 135  K 4.6 3.8 4.3  CL 100 101 101  CO2 27 26 26   GLUCOSE 107* 109* 120*  BUN 31* 35* 34*  CREATININE 0.78 0.86 0.61  CALCIUM 8.3* 8.5* 8.2*   Liver Function Tests: Recent Labs  Lab 06/21/18 0405  AST 22  ALT 51*  ALKPHOS 136*  BILITOT 0.3  PROT 5.3*  ALBUMIN 2.5*   No results for input(s): LIPASE, AMYLASE in the last 168 hours. No results for input(s): AMMONIA in the last 168 hours. CBC: Recent Labs  Lab 06/21/18 0405  WBC 12.3*  NEUTROABS 10.1*  HGB 8.0*  HCT 26.7*  MCV 95.0  PLT 571*   Cardiac Enzymes: No results for input(s): CKTOTAL, CKMB, CKMBINDEX, TROPONINI in the last 168 hours. BNP: BNP (last 3 results) Recent Labs    04/28/18 0549  BNP 640.3*    ProBNP (last 3  results) No results for input(s): PROBNP in the last 8760 hours.  CBG: No results for input(s): GLUCAP in the last 168 hours.     Signed:  Nita Sells MD   [facility] 06/21/2018, 11:52 AM

## 2018-06-21 NOTE — Progress Notes (Signed)
Attempted to call discharge report to [facility] and was forwarded to charge nurse voice mail. No response received. Transported to facility via [place]. Eulas Post, RN

## 2018-06-21 NOTE — Progress Notes (Signed)
CSW followed up with Vickii Chafe with [facility] (long-term care)- informed CSW that facility DOES have beds available however facility has to go through their own assessment process and could not confirm bed offer for patient.   Ms. Vickii Chafe stated she had to speak with their DON in the AM(1/3) and would then be able to provide CSW with information regarding bed offer/ admission. CSW will follow up with Peggy this morning or afternoon.   Kingsley Spittle, [place]  [phone]

## 2018-06-21 NOTE — Progress Notes (Signed)
Spoke with patient's son Senaida Lange met with [place] social worker today and was please with the facility and bed offer-he is discussing with his mom and hopeful for discharge today to [facility] to follow there. Recommend leaving her PICC line in -Hospice can manage this if needed ie drg changes-she may need IV pain medication at some point in the near future and this would make her symptom management much easier and more effective. Other wise I would maintain all of her current medications and care orders.  Lane Hacker, DO Palliative Medicine [phone]

## 2018-06-21 NOTE — Progress Notes (Signed)
[facility] is set to [name] today. Patient & son, Abe People, aware. Discharge packet given to RN, Hinton Dyer.  RN requested PTAR to be scheduled for 4PM  Please call report to [place], LCSW Clinical Social Worker [phone]

## 2018-06-22 ENCOUNTER — Other Ambulatory Visit: Payer: Self-pay | Admitting: Internal Medicine

## 2018-06-22 DIAGNOSIS — C3490 Malignant neoplasm of unspecified part of unspecified bronchus or lung: Secondary | ICD-10-CM

## 2018-06-22 DIAGNOSIS — C7951 Secondary malignant neoplasm of bone: Principal | ICD-10-CM

## 2018-06-22 NOTE — Progress Notes (Signed)
Patient son called after discharge -requesting hospice services. [facility] called with referral information.

## 2018-06-24 DIAGNOSIS — R627 Adult failure to thrive: Secondary | ICD-10-CM | POA: Diagnosis not present

## 2018-06-24 DIAGNOSIS — I513 Intracardiac thrombosis, not elsewhere classified: Secondary | ICD-10-CM | POA: Diagnosis not present

## 2018-06-24 DIAGNOSIS — C3492 Malignant neoplasm of unspecified part of left bronchus or lung: Secondary | ICD-10-CM | POA: Diagnosis not present

## 2018-06-24 DIAGNOSIS — K219 Gastro-esophageal reflux disease without esophagitis: Secondary | ICD-10-CM | POA: Diagnosis not present

## 2018-07-02 ENCOUNTER — Ambulatory Visit: Payer: Self-pay | Admitting: Urology

## 2018-07-04 ENCOUNTER — Ambulatory Visit
Admission: RE | Admit: 2018-07-04 | Discharge: 2018-07-04 | Disposition: A | Discharge status: Home / Self Care | Payer: Medicare Other | Source: Ambulatory Visit | Attending: Urology | Admitting: Urology

## 2018-07-09 ENCOUNTER — Inpatient Hospital Stay: Payer: Medicare Other

## 2018-07-09 ENCOUNTER — Inpatient Hospital Stay: Payer: Medicare Other | Admitting: Internal Medicine

## 2018-07-09 ENCOUNTER — Inpatient Hospital Stay: Payer: Medicare Other | Attending: Oncology

## 2018-07-10 ENCOUNTER — Inpatient Hospital Stay: Payer: Medicare Other

## 2018-07-11 ENCOUNTER — Inpatient Hospital Stay: Payer: Medicare Other

## 2018-10-18 DEATH — deceased

## 2020-07-10 IMAGING — MR MR HEAD WO/W CM
10 of 13 series · 33 of 48 positions shown · IV contrast (gadavist)
Comparison: Prior CT from 07/07/2016

CLINICAL DATA: Initial evaluation for small cell lung cancer,
staging.

EXAM:
MRI HEAD WITHOUT AND WITH CONTRAST
TECHNIQUE: Multiplanar, multiecho pulse sequences of the brain and surrounding
structures were obtained without and with intravenous contrast.
CONTRAST:  60 cc of Gadavist.

[Series 3: DWI · axial · 3.0mm · 1.09mm/px · z∈[+6,+164]mm · 8 of 108 slices shown (1 of 4)]
[im 1/108]
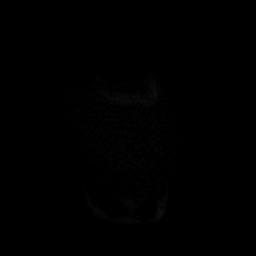
[im 12/108]
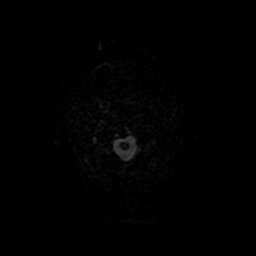
[im 36/108]
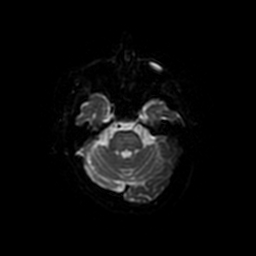
[im 48/108]
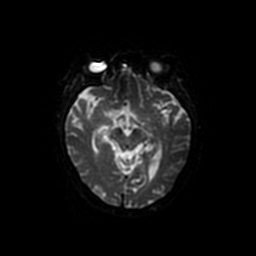
[im 60/108]
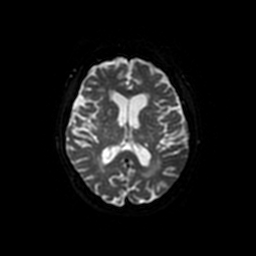
[im 72/108]
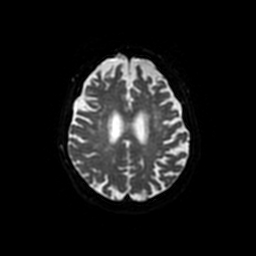
[im 96/108]
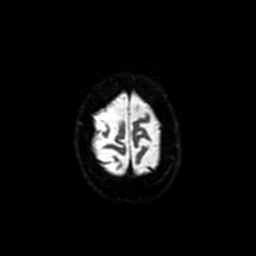
[im 108/108]
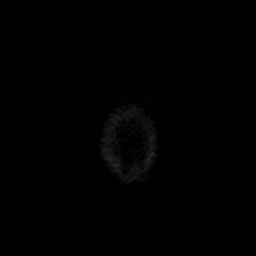

[Series 4: T1 · sagittal · 5.0mm · 0.47mm/px · 1 of 24 slices shown]
[im 1/24]
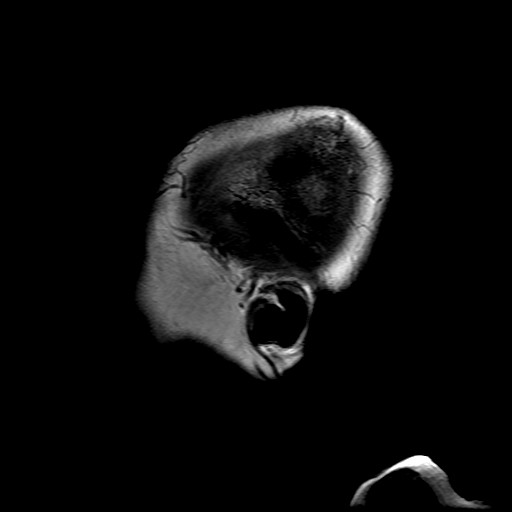

[Series 5: DWI · coronal · 4.0mm · 1.09mm/px · 6 of 70 slices shown (2 of 4)]
[im 1/70]
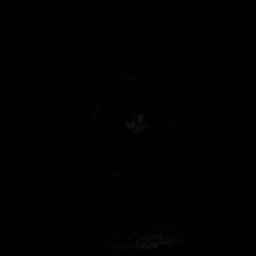
[im 14/70]
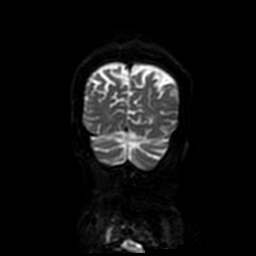
[im 28/70]
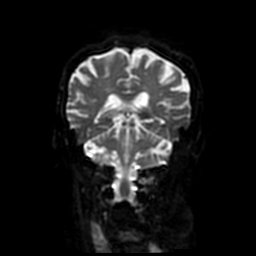
[im 42/70]
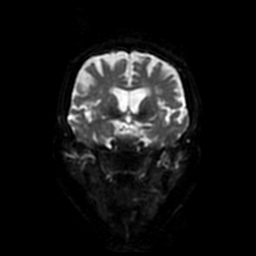
[im 56/70]
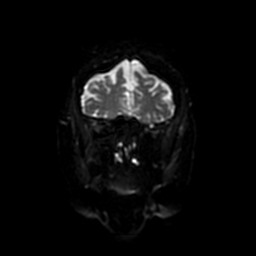
[im 70/70]
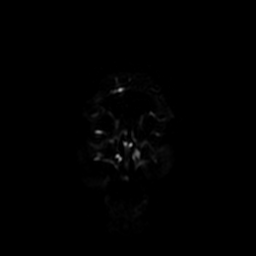

[Series 6: T2 · axial · 5.0mm · 0.43mm/px · z∈[+2,+157]mm · 2 of 27 slices shown]
[im 1/27]
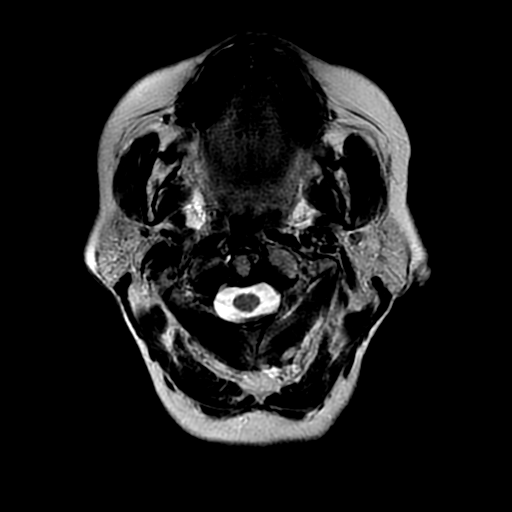
[im 27/27]
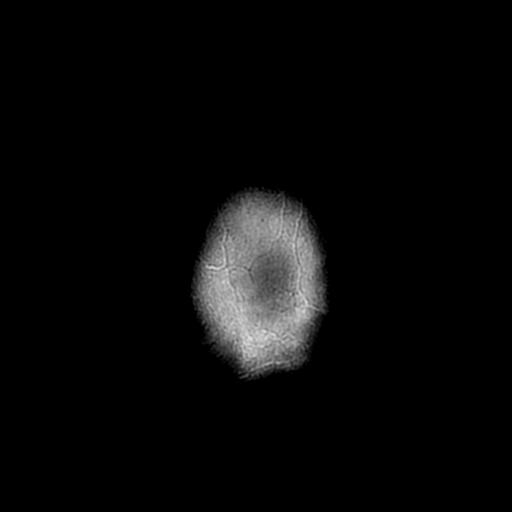

[Series 7: FLAIR · axial · 5.0mm · 0.43mm/px · z∈[+2,+157]mm · 2 of 27 slices shown]
[im 1/27]
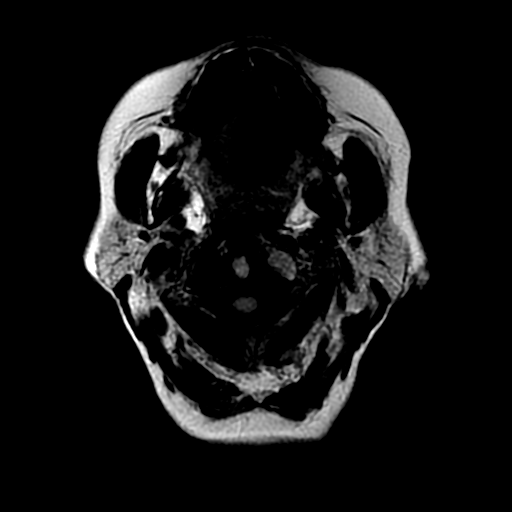
[im 27/27]
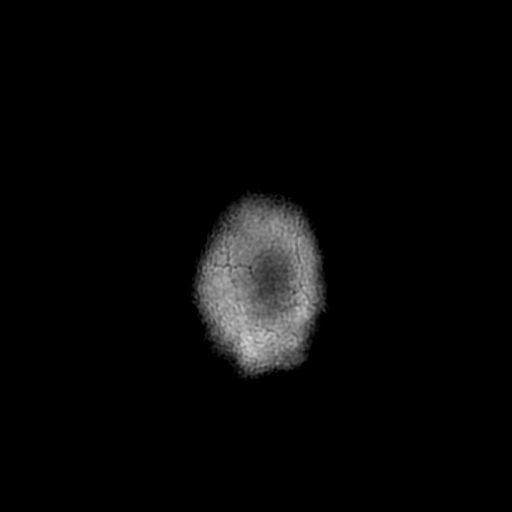

[Series 11: T2 post-contrast · coronal · 5.0mm · 0.45mm/px · 2 of 27 slices shown]
[im 1/27]
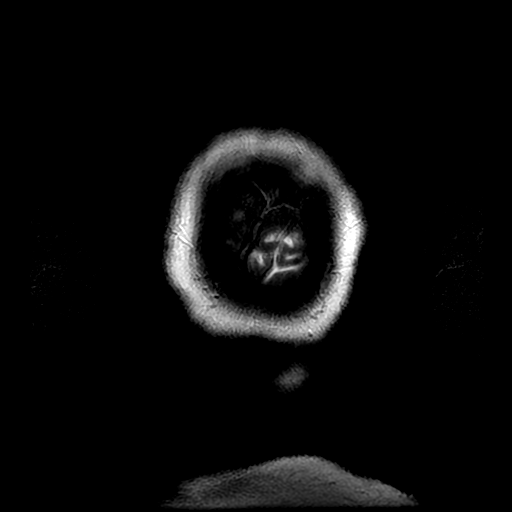
[im 27/27]
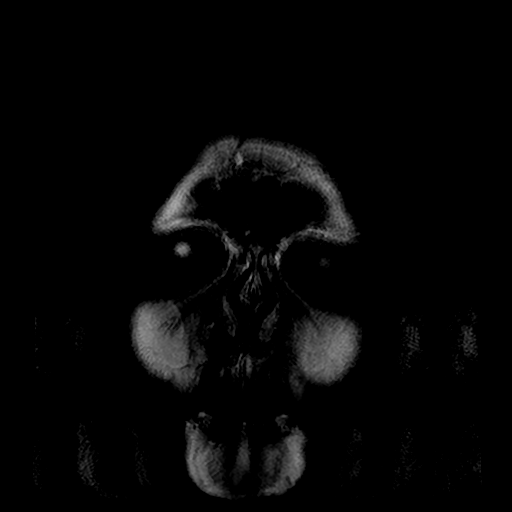

[Series 13: T1 post-contrast · coronal · 5.0mm · 0.45mm/px · 2 of 27 slices shown (1 of 2)]
[im 1/27]
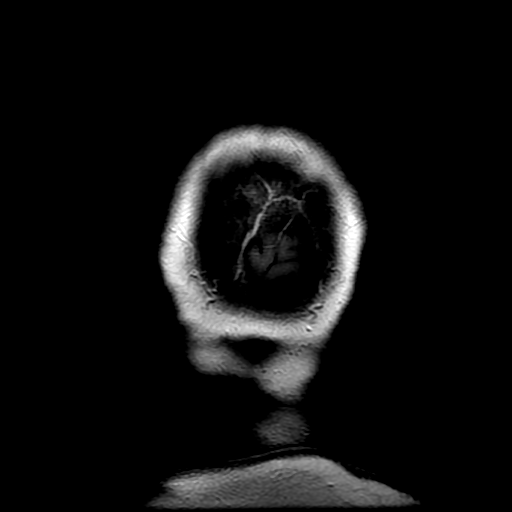
[im 27/27]
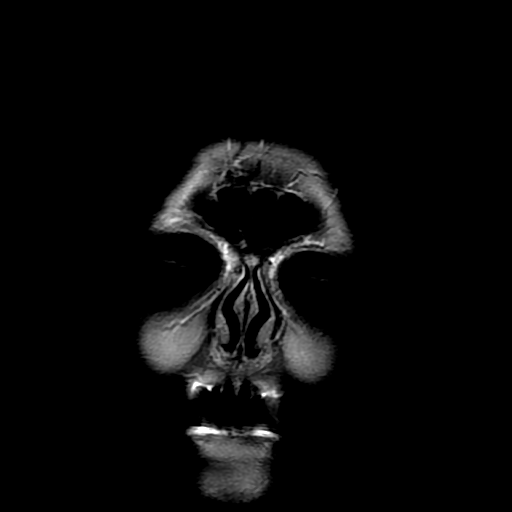

[Series 14: T1 post-contrast · sagittal · 5.0mm · 0.47mm/px · 2 of 26 slices shown (2 of 2)]
[im 1/26]
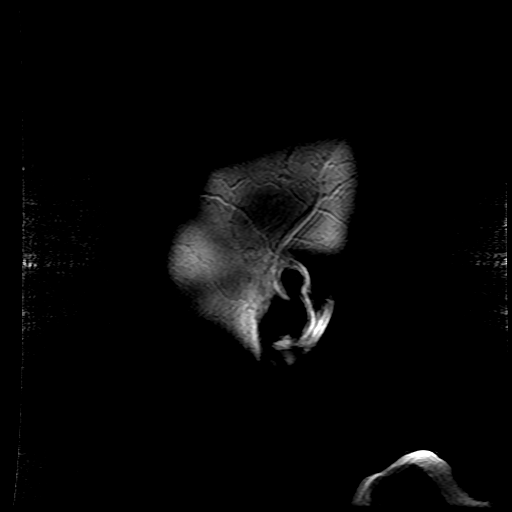
[im 26/26]
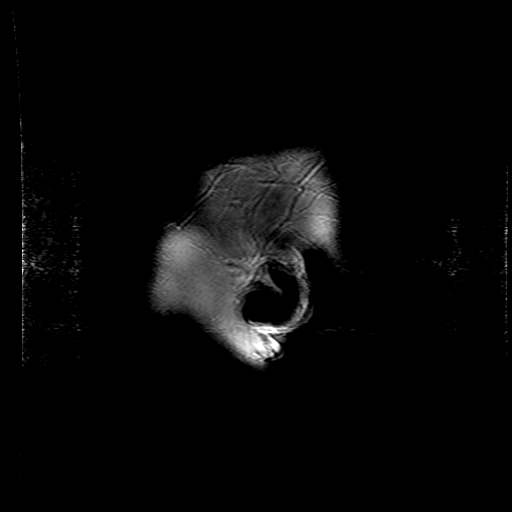

[Series 300: DWI · axial · 3.0mm · 1.09mm/px · z∈[+6,+164]mm · 5 of 54 slices shown (3 of 4)]
[im 1/54]
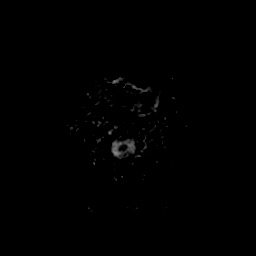
[im 14/54]
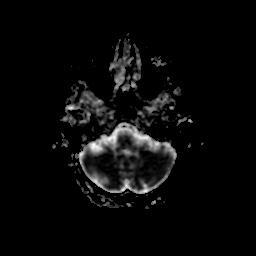
[im 27/54]
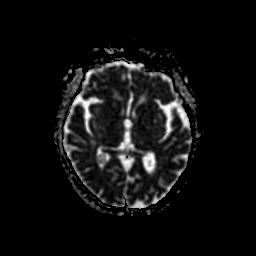
[im 40/54]
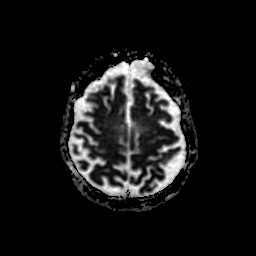
[im 54/54]
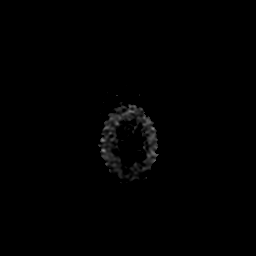

[Series 500: DWI · coronal · 4.0mm · 1.09mm/px · 3 of 35 slices shown (4 of 4)]
[im 1/35]
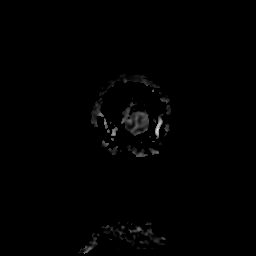
[im 18/35]
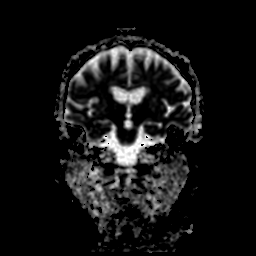
[im 35/35]
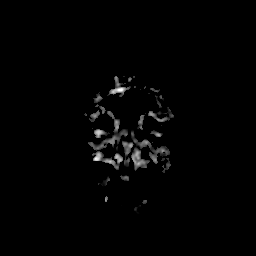

[33 of 48 positions shown; findings below may reference images not displayed]

FINDINGS: Brain: Moderately advanced cerebral atrophy with chronic small
vessel ischemic disease. Superimposed remote left thalamic lacunar
infarct noted. No abnormal foci of restricted diffusion to suggest
acute or subacute ischemia. Gray-white matter differentiation
maintained. No encephalomalacia to suggest chronic cortical
infarction. No foci of susceptibility artifact to suggest acute or
chronic intracranial hemorrhage.

No mass lesion, midline shift or mass effect. Ventricles normal size
without hydrocephalus. No extra-axial fluid collection. No abnormal
enhancement. Apparent punctate focus of enhancement within the
subcortical white matter of the left frontal lobe seen on coronal
post gadolinium sequence image 15 favored to be artifactual or
possibly vascular in nature. No other evidence for intracranial
metastatic disease.

Pituitary gland grossly within normal limits.

Vascular: Major intracranial vascular flow voids maintained.

Skull and upper cervical spine: Craniocervical junction normal.
Multilevel cervical spondylolysis noted within the upper cervical
spine without high-grade stenosis. There is abnormal T1 hypointense
signal intensity seen involving the right lateral mass of C1 (series
4, image 8), indeterminate, but concerning for possible osseous
metastasis. No other focal marrow replacing lesion identified.

Sinuses/Orbits: Globes and orbital soft tissues within normal
limits. Right maxillary sinus retention cyst. Paranasal sinuses are
otherwise clear. No significant mastoid effusion. Inner ear
structures grossly normal.

Other: None.
IMPRESSION: 1. Abnormal signal intensity involving the right lateral mass of C1,
concerning for possible osseous metastasis.
2. No other MRI evidence for intracranial metastatic disease within
the brain.
3. No other acute intracranial abnormality.
4. Moderately advanced cerebral atrophy with chronic small vessel
ischemic disease.
